# Patient Record
Sex: Female | Born: 1945 | Race: White | Hispanic: No | Marital: Married | Smoking: Former smoker
Health system: Southern US, Community
[De-identification: ages and names within clinical notes are randomized; demographics above are authoritative.]

## PROBLEM LIST (undated history)

## (undated) DIAGNOSIS — R599 Enlarged lymph nodes, unspecified: Secondary | ICD-10-CM

## (undated) DIAGNOSIS — I639 Cerebral infarction, unspecified: Secondary | ICD-10-CM

## (undated) DIAGNOSIS — E039 Hypothyroidism, unspecified: Secondary | ICD-10-CM

## (undated) DIAGNOSIS — M1990 Unspecified osteoarthritis, unspecified site: Secondary | ICD-10-CM

## (undated) DIAGNOSIS — R1310 Dysphagia, unspecified: Secondary | ICD-10-CM

## (undated) DIAGNOSIS — Z973 Presence of spectacles and contact lenses: Secondary | ICD-10-CM

## (undated) DIAGNOSIS — G4730 Sleep apnea, unspecified: Secondary | ICD-10-CM

## (undated) DIAGNOSIS — E0590 Thyrotoxicosis, unspecified without thyrotoxic crisis or storm: Secondary | ICD-10-CM

## (undated) DIAGNOSIS — G629 Polyneuropathy, unspecified: Secondary | ICD-10-CM

## (undated) DIAGNOSIS — K219 Gastro-esophageal reflux disease without esophagitis: Secondary | ICD-10-CM

## (undated) DIAGNOSIS — G709 Myoneural disorder, unspecified: Secondary | ICD-10-CM

## (undated) DIAGNOSIS — I82409 Acute embolism and thrombosis of unspecified deep veins of unspecified lower extremity: Secondary | ICD-10-CM

## (undated) DIAGNOSIS — B49 Unspecified mycosis: Secondary | ICD-10-CM

## (undated) DIAGNOSIS — F32A Depression, unspecified: Secondary | ICD-10-CM

## (undated) DIAGNOSIS — Q2112 Patent foramen ovale: Secondary | ICD-10-CM

## (undated) DIAGNOSIS — F419 Anxiety disorder, unspecified: Secondary | ICD-10-CM

## (undated) DIAGNOSIS — N393 Stress incontinence (female) (male): Secondary | ICD-10-CM

## (undated) DIAGNOSIS — G43909 Migraine, unspecified, not intractable, without status migrainosus: Secondary | ICD-10-CM

## (undated) HISTORY — PX: CARDIAC SURGERY: SHX584

## (undated) HISTORY — DX: Thyrotoxicosis, unspecified without thyrotoxic crisis or storm: E05.90

## (undated) HISTORY — DX: Migraine, unspecified, not intractable, without status migrainosus: G43.909

## (undated) HISTORY — PX: TUBAL LIGATION: SHX77

## (undated) HISTORY — DX: Enlarged lymph nodes, unspecified: R59.9

## (undated) HISTORY — PX: EYE SURGERY: SHX253

## (undated) HISTORY — PX: OTHER SURGICAL HISTORY: SHX169

## (undated) HISTORY — DX: Dysphagia, unspecified: R13.10

---

## 1999-02-25 ENCOUNTER — Ambulatory Visit (HOSPITAL_COMMUNITY): Admission: RE | Admit: 1999-02-25 | Discharge: 1999-02-25 | Payer: Self-pay | Admitting: Internal Medicine

## 1999-09-21 DIAGNOSIS — C801 Malignant (primary) neoplasm, unspecified: Secondary | ICD-10-CM

## 1999-09-21 HISTORY — PX: MOHS SURGERY: SHX181

## 1999-09-21 HISTORY — DX: Malignant (primary) neoplasm, unspecified: C80.1

## 1999-10-14 ENCOUNTER — Encounter: Payer: Self-pay | Admitting: General Surgery

## 1999-10-15 ENCOUNTER — Encounter: Payer: Self-pay | Admitting: General Surgery

## 1999-10-15 ENCOUNTER — Encounter (INDEPENDENT_AMBULATORY_CARE_PROVIDER_SITE_OTHER): Payer: Self-pay | Admitting: Specialist

## 1999-10-15 ENCOUNTER — Ambulatory Visit (HOSPITAL_COMMUNITY): Admission: RE | Admit: 1999-10-15 | Discharge: 1999-10-16 | Payer: Self-pay | Admitting: General Surgery

## 2000-07-22 ENCOUNTER — Other Ambulatory Visit: Admission: RE | Admit: 2000-07-22 | Discharge: 2000-07-22 | Payer: Self-pay | Admitting: Gynecology

## 2001-07-24 ENCOUNTER — Other Ambulatory Visit: Admission: RE | Admit: 2001-07-24 | Discharge: 2001-07-24 | Payer: Self-pay | Admitting: Gynecology

## 2002-07-25 ENCOUNTER — Other Ambulatory Visit: Admission: RE | Admit: 2002-07-25 | Discharge: 2002-07-25 | Payer: Self-pay | Admitting: Gynecology

## 2002-08-14 ENCOUNTER — Encounter: Payer: Self-pay | Admitting: Internal Medicine

## 2002-08-14 ENCOUNTER — Encounter: Admission: RE | Admit: 2002-08-14 | Discharge: 2002-08-14 | Payer: Self-pay | Admitting: Internal Medicine

## 2003-09-12 ENCOUNTER — Other Ambulatory Visit: Admission: RE | Admit: 2003-09-12 | Discharge: 2003-09-12 | Payer: Self-pay | Admitting: Gynecology

## 2004-03-12 ENCOUNTER — Encounter: Admission: RE | Admit: 2004-03-12 | Discharge: 2004-03-12 | Payer: Self-pay | Admitting: Internal Medicine

## 2004-10-06 ENCOUNTER — Other Ambulatory Visit: Admission: RE | Admit: 2004-10-06 | Discharge: 2004-10-06 | Payer: Self-pay | Admitting: Gynecology

## 2005-01-28 ENCOUNTER — Ambulatory Visit (HOSPITAL_COMMUNITY): Admission: RE | Admit: 2005-01-28 | Discharge: 2005-01-28 | Payer: Self-pay | Admitting: *Deleted

## 2005-04-13 ENCOUNTER — Ambulatory Visit (HOSPITAL_COMMUNITY): Admission: RE | Admit: 2005-04-13 | Discharge: 2005-04-13 | Payer: Self-pay | Admitting: *Deleted

## 2005-04-13 ENCOUNTER — Encounter (INDEPENDENT_AMBULATORY_CARE_PROVIDER_SITE_OTHER): Payer: Self-pay | Admitting: *Deleted

## 2005-10-26 ENCOUNTER — Other Ambulatory Visit: Admission: RE | Admit: 2005-10-26 | Discharge: 2005-10-26 | Payer: Self-pay | Admitting: Gynecology

## 2006-02-27 ENCOUNTER — Emergency Department (HOSPITAL_COMMUNITY): Admission: EM | Admit: 2006-02-27 | Discharge: 2006-02-28 | Payer: Self-pay | Admitting: Emergency Medicine

## 2007-01-24 ENCOUNTER — Other Ambulatory Visit: Admission: RE | Admit: 2007-01-24 | Discharge: 2007-01-24 | Payer: Self-pay | Admitting: Gynecology

## 2008-02-29 ENCOUNTER — Other Ambulatory Visit: Admission: RE | Admit: 2008-02-29 | Discharge: 2008-02-29 | Payer: Self-pay | Admitting: Gynecology

## 2010-06-21 ENCOUNTER — Inpatient Hospital Stay (HOSPITAL_COMMUNITY): Admission: EM | Admit: 2010-06-21 | Discharge: 2010-06-26 | Payer: Self-pay | Admitting: Emergency Medicine

## 2010-06-21 ENCOUNTER — Ambulatory Visit: Payer: Self-pay | Admitting: Cardiology

## 2010-06-21 ENCOUNTER — Ambulatory Visit: Payer: Self-pay | Admitting: Cardiovascular Disease

## 2010-06-21 ENCOUNTER — Ambulatory Visit: Payer: Self-pay | Admitting: Critical Care Medicine

## 2010-06-23 ENCOUNTER — Encounter: Payer: Self-pay | Admitting: Emergency Medicine

## 2010-06-28 ENCOUNTER — Inpatient Hospital Stay (HOSPITAL_COMMUNITY): Admission: EM | Admit: 2010-06-28 | Discharge: 2010-07-05 | Payer: Self-pay | Admitting: Emergency Medicine

## 2010-06-29 ENCOUNTER — Ambulatory Visit: Payer: Self-pay | Admitting: Infectious Diseases

## 2010-06-30 ENCOUNTER — Ambulatory Visit: Payer: Self-pay | Admitting: Thoracic Surgery

## 2010-07-02 ENCOUNTER — Encounter: Payer: Self-pay | Admitting: Emergency Medicine

## 2010-07-03 ENCOUNTER — Ambulatory Visit: Payer: Self-pay | Admitting: Infectious Diseases

## 2010-07-17 ENCOUNTER — Ambulatory Visit: Payer: Self-pay | Admitting: Emergency Medicine

## 2010-07-17 DIAGNOSIS — E0590 Thyrotoxicosis, unspecified without thyrotoxic crisis or storm: Secondary | ICD-10-CM | POA: Insufficient documentation

## 2010-07-17 DIAGNOSIS — G43909 Migraine, unspecified, not intractable, without status migrainosus: Secondary | ICD-10-CM | POA: Insufficient documentation

## 2010-07-17 DIAGNOSIS — R599 Enlarged lymph nodes, unspecified: Secondary | ICD-10-CM | POA: Insufficient documentation

## 2010-07-30 ENCOUNTER — Ambulatory Visit: Payer: Self-pay | Admitting: Emergency Medicine

## 2010-08-04 ENCOUNTER — Telehealth (INDEPENDENT_AMBULATORY_CARE_PROVIDER_SITE_OTHER): Payer: Self-pay | Admitting: *Deleted

## 2010-08-05 ENCOUNTER — Telehealth (INDEPENDENT_AMBULATORY_CARE_PROVIDER_SITE_OTHER): Payer: Self-pay | Admitting: *Deleted

## 2010-09-11 ENCOUNTER — Ambulatory Visit: Payer: Self-pay | Admitting: Cardiology

## 2010-09-18 ENCOUNTER — Ambulatory Visit: Payer: Self-pay | Admitting: Emergency Medicine

## 2010-10-20 NOTE — Progress Notes (Signed)
Summary: schedule ct chest > late December  Phone Note Call from Patient Call back at [facility] Phone [phone]   Caller: Patient Call For: byrum Summary of Call: Wants to get a ct chest scheduled. Initial call taken by: Darletta Moll,  August 04, 2010 1:51 PM  Follow-up for Phone Call        per last OV w/ RB, to have follow up CT in mid-Jan.  called spoke with patient who states that it was discussed to have the CT in late-Dec rather than wait till the new year d/t insurance.  pt requests either the 23rd of december or the last week in dec early in the day.  Dr. Delton Coombes, can the CT be moved up?  will be happy to place the order. Boone Master CNA/MA  August 04, 2010 3:00 PM   Thanks, yes, the CT can be done late Dec instead. Leslye Peer MD  August 04, 2010 3:37 PM  Follow-up by: Leslye Peer MD,  August 04, 2010 3:37 PM  Additional Follow-up for Phone Call Additional follow up Details #1::        per Bjorn Loser, ok to send phone note to [facility] inbox rather than another order.  last CT chest was with contrast in 10.2011.  pt prefers either 12.23.11 any time of the day, or the last week in December early day. Boone Master CNA/MA  August 04, 2010 3:54 PM     Additional Follow-up for Phone Call Additional follow up Details #2::    chest ct with contrast @[facility]  09/11/10@10 :00am pt is aware  Follow-up by: Oneita Jolly,  August 04, 2010 4:17 PM

## 2010-10-20 NOTE — Assessment & Plan Note (Signed)
Summary: abnormal CTscan of the chest   Visit Type:  Hospital Follow-up Primary Aiza Vollrath/Referring Desyre Calma:  Dr. Burton Apley  CC:  HFU...the patient c/o an occasinal  dry hacky cough...oral thrush is gone .  History of Present Illness: 45 overwt woman, hypothyroidism, recently admitted to [facility] with lymphadenitis/salivary gland assoc with septic shock and transaminitis. No other source identified. W/u identified nodular infiltrates and hilar LAD. She was readmitted with rash and her LAD was felt to be worse, therefore underwent Wang needle bx's with EBUS. Path negative.  She needs a functional exam today to fill out paperwork for shortterm disability.   ROV 07/30/10 -- f/u for her hilar LAD, patchy parenchymal infiltrates on CT scan 10/11. AFB and fungal negative to date. EBUS bx's negative. Feels great, back to her baseline.   Preventive Screening-Counseling & Management  Alcohol-Tobacco     Smoking Status: never  Current Medications (verified): 1)  Aspirin 325 Mg Tabs (Aspirin) .... Take 1 Tablet By Mouth Once A Day and As Needed 2)  Levothroid 100 Mcg Tabs (Levothyroxine Sodium) .... Take 1 Tablet By Mouth Once A Day 3)  Ranitidine Hcl 150 Mg Tabs (Ranitidine Hcl) .... As Needed 4)  Fiorinal 50-325-40 Mg Caps (Butalbital-Aspirin-Caffeine) .... As Needed  Allergies: 1)  ! * Statins 2)  ! * Losartan Hct 3)  ! * Quinolones 4)  Pcn  Social History: Smoking Status:  never  Vital Signs:  Patient profile:   65 year old female Height:      65 inches (165.10 cm) Weight:      154.13 pounds (70.06 kg) BMI:     25.74 O2 Sat:      97 % on Room air Temp:     97.7 degrees F (36.50 degrees C) oral Pulse rate:   110 / minute BP sitting:   122 / 90  (left arm) Cuff size:   regular  O2 Sat at Rest %:  97 O2 Flow:  Room air CC: HFU...the patient c/o an occasinal  dry hacky cough...oral thrush is gone  Comments Medications reviewed with patient Michel Bickers CMA  July 30, 2010 3:15  PM   Physical Exam  General:  normal appearance, healthy appearing, and obese.   Head:  normocephalic and atraumatic Eyes:  conjunctiva and sclera clear Nose:  no deformity, discharge, inflammation, or lesions Mouth:  no deformity or lesions Neck:  no masses, thyromegaly, or abnormal cervical nodes Lungs:  clear bilaterally Heart:  regular rate and rhythm, S1, S2 without murmurs, rubs, gallops, or clicks Abdomen:  not examined Msk:  no deformity or scoliosis noted with normal posture Skin:  intact without lesions or rashes Psych:  alert and cooperative; normal mood and affect; normal attention span and concentration   Impression & Recommendations:  Problem # 1:  LYMPHADENOPATHY (ICD-785.6) Repeat Ct scan in january review the scan and decide re further biopsies, serial films, etc.   Other Orders: Radiology Referral (Radiology) Est. Patient Level IV (14782)  Patient Instructions: 1)  We will repeat your CT scan of the chest in mid-January 2012.  2)  Follow with Dr Delton Coombes after the scan to review or sooner if you have any problems.    Immunization History:  Influenza Immunization History:    Influenza:  historical (12/19/2009)

## 2010-10-20 NOTE — Letter (Signed)
Summary: [facility]  [address]   [place], [place] [address]   Phone: [phone]  Fax: [phone]    07/30/2010    TO WHOM IT MAY CONCERN,   [name] has been under my care and may return to work on Monday, 08/03/2010, with no restrictions.    Sincerely,   Levy Pupa, M.D.

## 2010-10-20 NOTE — Progress Notes (Signed)
Summary: cat scan-[id number]  Phone Note Call from Patient Call back at [phone]   Caller: Patient Call For: byrum Summary of Call: pt would like to talk to nurse about cat scan. Initial call taken by: Rickard Patience,  August 05, 2010 8:30 AM  Follow-up for Phone Call        [email or web address]. Carron Curie CMA  August 05, 2010 9:18 AM  pt has already spoken to someone and her question has been answered Follow-up by: Philipp Deputy CMA,  August 05, 2010 5:28 PM

## 2010-10-20 NOTE — Assessment & Plan Note (Signed)
Summary: LAD   Visit Type:  Hospital Follow-up  CC:  Follow up for disability paperwork.  SOB when doing activity but overall doing well.  "constant cough, " head congestion.  Cough is dry, and hacky.  Marland Kitchen  History of Present Illness: 46 overwt woman, hypothyroidism, recently admitted to [facility] with lymphadenitis/salivary gland assoc with septic shock and transaminitis. No other source identified. W/u identified nodular infiltrates and mediastinal LAD. She was readmitted with rash and her LAD was felt to be worse, therefore underwent Wang needle bx's with EBUS. Path negative.  She needs a functional exam today to fill out paperwork for shortterm disability.   Current Medications (verified): 1)  Aspirin 325 Mg Tabs (Aspirin) .... Take 1 Tablet By Mouth Once A Day and As Needed 2)  Levothroid 100 Mcg Tabs (Levothyroxine Sodium) .... Take 1 Tablet By Mouth Once A Day 3)  Ranitidine Hcl 150 Mg Tabs (Ranitidine Hcl) .... As Needed 4)  Fiorinal 50-325-40 Mg Caps (Butalbital-Aspirin-Caffeine) .... As Needed  Allergies (verified): 1)  ! * Statins 2)  Pcn  Past History:  Past Medical History: MIGRAINE HEADACHE (ICD-346.90) HYPERTHYROIDISM (ICD-242.90)  Past Surgical History: melonoma removed left thigh - 2001  Family History: brother - arthritis sister - arthritis  Social History: Never smoked married 2 children [employer]   Vital Signs:  Patient profile:   65 year old female Height:      65 inches Weight:      157.25 pounds BMI:     26.26 O2 Sat:      96 % on Room air Temp:     98.4 degrees F oral Pulse rate:   97 / minute BP sitting:   138 / 92  (right arm) Cuff size:   regular  Vitals Entered By: Gweneth Dimitri RN (July 17, 2010 1:39 PM)  O2 Flow:  Room air CC: Follow up for disability paperwork.  SOB when doing activity but overall doing well.  "constant cough," head congestion.  Cough is dry, hacky.   Comments Medications reviewed with patient Daytime contact  number verified with patient. Gweneth Dimitri RN  July 17, 2010 1:40 PM    Physical Exam  General:  normal appearance, healthy appearing, and obese.   Head:  normocephalic and atraumatic Eyes:  conjunctiva and sclera clear Nose:  no deformity, discharge, inflammation, or lesions Mouth:  no deformity or lesions Neck:  no masses, thyromegaly, or abnormal cervical nodes Lungs:  clear bilaterally Heart:  regular rate and rhythm, S1, S2 without murmurs, rubs, gallops, or clicks Abdomen:  not examined Msk:  no deformity or scoliosis noted with normal posture Skin:  intact without lesions or rashes Psych:  alert and cooperative; normal mood and affect; normal attention span and concentration   Impression & Recommendations:  Problem # 1:  LYMPHADENOPATHY (ICD-785.6)  Orders: New Patient Level IV ([address]109)  Medications Added to Medication List This Visit: 1)  Aspirin 325 Mg Tabs (Aspirin) .... Take 1 tablet by mouth once a day and as needed 2)  Levothroid 100 Mcg Tabs (Levothyroxine sodium) .... Take 1 tablet by mouth once a day 3)  Ranitidine Hcl 150 Mg Tabs (Ranitidine hcl) .... As needed 4)  Fiorinal 50-325-40 Mg Caps (Butalbital-aspirin-caffeine) .... As needed 5)  Fluconazole 100 Mg Tabs (Fluconazole) .Marland Kitchen.. 1 by mouth once daily  Patient Instructions: 1)  Your biopsy results and cultures are negative so far. We will follow these out for 6 weeks 2)  Take fluconazole 100mg  once daily for 3  days for possible thrush 3)  Follow up with Dr Delton Coombes on Nov 10 as planned.  Prescriptions: FLUCONAZOLE 100 MG TABS (FLUCONAZOLE) 1 by mouth once daily  #3 x 0   Entered and Authorized by:   Leslye Peer MD   Signed by:   Leslye Peer MD on 07/17/2010   Method used:   Electronically to        [facility] (retail)       [address]       [place], [place]  [address]       Ph: [phone]       Fax: [phone]   RxID:   [id number]

## 2010-10-22 NOTE — Assessment & Plan Note (Signed)
Summary: LAD, nodule   Visit Type:  Follow-up Primary Joury Allcorn/Referring Lenford Beddow:  Dr. Burton Apley  CC:  Followup on CT Chest- pt states doing well and denies any complaints today.Marland Kitchen  History of Present Illness: 27 overwt woman, hypothyroidism, recently admitted to [facility] with lymphadenitis/salivary gland assoc with septic shock and transaminitis. No other source identified. W/u identified nodular infiltrates and hilar LAD. She was readmitted with rash and her LAD was felt to be worse, therefore underwent Wang needle bx's with EBUS. Path negative.  She needs a functional exam today to fill out paperwork for shortterm disability.   ROV 07/30/10 -- f/u for her hilar LAD, patchy parenchymal infiltrates on CT scan 10/11. AFB and fungal negative to date. EBUS bx's negative. Feels great, back to her baseline.   ROV 09/18/10 -- feeling well, no new problems. CT scan done as below, shows resolution of parenchymal infiltrates, improvement in LAD, LLL pleural based nodule.   Current Medications (verified): 1)  Aspirin 325 Mg Tabs (Aspirin) .... Take 1 Tablet By Mouth Once A Day and As Needed 2)  Levothroid 100 Mcg Tabs (Levothyroxine Sodium) .... Take 1 Tablet By Mouth Once A Day 3)  Ranitidine Hcl 150 Mg Tabs (Ranitidine Hcl) .... As Needed 4)  Fiorinal 50-325-40 Mg Caps (Butalbital-Aspirin-Caffeine) .... As Needed 5)  Prednisone 20 Mg Tabs (Prednisone) .... Tapered Dose As Directed Per [employer] 6)  Diphenhydramine Hcl 50 Mg Tabs (Diphenhydramine Hcl) .Marland Kitchen.. 1 Three Times A Day  Allergies: 1)  ! * Statins 2)  ! * Losartan Hct 3)  ! * Quinolones 4)  ! * Ivp Dye 5)  Pcn  Vital Signs:  Patient profile:   65 year old female Weight:      155 pounds O2 Sat:      97 % on Room air Temp:     98.0 degrees F oral Pulse rate:   70 / minute BP sitting:   122 / 82  (left arm)  Vitals Entered By: Vernie Murders (September 18, 2010 2:46 PM)  O2 Flow:  Room air  Physical Exam  General:  normal  appearance, healthy appearing, and obese.   Head:  normocephalic and atraumatic Eyes:  conjunctiva and sclera clear Nose:  no deformity, discharge, inflammation, or lesions Mouth:  no deformity or lesions Neck:  no masses, thyromegaly, or abnormal cervical nodes Lungs:  clear bilaterally Heart:  regular rate and rhythm, S1, S2 without murmurs, rubs, gallops, or clicks Abdomen:  not examined Msk:  no deformity or scoliosis noted with normal posture Skin:  intact without lesions or rashes Psych:  alert and cooperative; normal mood and affect; normal attention span and concentration   CT of Chest  Procedure date:  09/11/2010  Findings:      Comparison: Chest CT 06/28/2010 and 06/21/2010.   Findings:  There has been interval improvement in the previously demonstrated mediastinal and hilar lymphadenopathy.  The largest remaining node in the azygos region measures 12 mm short axis on image 18.  There is no axillary adenopathy.   The pulmonary aeration has significantly improved compared with the prior examination.  The previously noted multi focal peripheral parenchymal opacities have resolved.  There is a small subpleural nodule in the left lower lobe measuring 6 mm on image 40.  This is well circumscribed and likely obscured by parenchymal opacity on the most recent examination.  No gross change from 06/21/2010 is identified.  There is no endobronchial lesion.  There is no pleural or pericardial effusion.  Small hiatal hernia is noted.  The visualized upper abdomen has a stable appearance with a small cyst in the upper pole of the left kidney.   IMPRESSION:   1.  Interval marked improvement in the mediastinal and hilar adenopathy. 2.  Interval resolution of multifocal pulmonary parenchymal opacities. 3.  Small subpleural left lower lobe nodule likely stable, but partly obscured by adjacent parenchymal opacity on the prior examinations. 4.  No acute findings  demonstrated.  Impression & Recommendations:  Problem # 1:  LYMPHADENOPATHY (ICD-785.6)  Resolved by CT. 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 The parenchymal infiltrates have resolved as well.  - need repeat CT in 08/2011 to follow LLL nodule 6mm  Orders: Est. Patient Level III (95284)  Medications Added to Medication List This Visit: 1)  Prednisone 20 Mg Tabs (Prednisone) .... Tapered dose as directed per [employer] 2)  Diphenhydramine Hcl 50 Mg Tabs (Diphenhydramine hcl) .Marland Kitchen.. 1 three times a day  Patient Instructions: 1)  We need to repeat your CT scan of the chest in December 2012 to follow your L lower nodule. Please call us in November so we can arrange.  2)  Call our office if you have any problems before your CT scan.

## 2010-12-02 LAB — CBC
HCT: 31.8 % — ABNORMAL LOW (ref 36.0–46.0)
Hemoglobin: 10.8 g/dL — ABNORMAL LOW (ref 12.0–15.0)
MCH: 32 pg (ref 26.0–34.0)
MCH: 32.2 pg (ref 26.0–34.0)
MCHC: 33.8 g/dL (ref 30.0–36.0)
MCV: 94.4 fL (ref 78.0–100.0)
MCV: 95.1 fL (ref 78.0–100.0)
Platelets: 416 K/uL — ABNORMAL HIGH (ref 150–400)
Platelets: 532 10*3/uL — ABNORMAL HIGH (ref 150–400)
RBC: 3.36 MIL/uL — ABNORMAL LOW (ref 3.87–5.11)
RBC: 3.61 MIL/uL — ABNORMAL LOW (ref 3.87–5.11)
RDW: 14.2 % (ref 11.5–15.5)
WBC: 6.9 K/uL (ref 4.0–10.5)

## 2010-12-02 LAB — COMPREHENSIVE METABOLIC PANEL
ALT: 28 U/L (ref 0–35)
AST: 19 U/L (ref 0–37)
Albumin: 2.5 g/dL — ABNORMAL LOW (ref 3.5–5.2)
Albumin: 2.8 g/dL — ABNORMAL LOW (ref 3.5–5.2)
Alkaline Phosphatase: 128 U/L — ABNORMAL HIGH (ref 39–117)
BUN: 3 mg/dL — ABNORMAL LOW (ref 6–23)
BUN: 4 mg/dL — ABNORMAL LOW (ref 6–23)
CO2: 29 mEq/L (ref 19–32)
Chloride: 106 mEq/L (ref 96–112)
Creatinine, Ser: 0.55 mg/dL (ref 0.4–1.2)
GFR calc Af Amer: >60 mL/min (ref >60)
GFR calc non Af Amer: >60 mL/min (ref >60)
Potassium: 4 mEq/L (ref 3.5–5.1)
Sodium: 140 mEq/L (ref 135–145)
Total Bilirubin: 0.2 mg/dL — ABNORMAL LOW (ref 0.3–1.2)
Total Protein: 5.6 g/dL — ABNORMAL LOW (ref 6.0–8.3)

## 2010-12-03 LAB — URINALYSIS, ROUTINE W REFLEX MICROSCOPIC
Bilirubin Urine: NEGATIVE
Glucose, UA: NEGATIVE mg/dL
Hgb urine dipstick: NEGATIVE
Nitrite: NEGATIVE
Specific Gravity, Urine: 1.031 — ABNORMAL HIGH (ref 1.005–1.030)
pH: 5.5 (ref 5.0–8.0)
pH: 6 (ref 5.0–8.0)

## 2010-12-03 LAB — CBC
HCT: 32.5 % — ABNORMAL LOW (ref 36.0–46.0)
HCT: 32.9 % — ABNORMAL LOW (ref 36.0–46.0)
HCT: 33 % — ABNORMAL LOW (ref 36.0–46.0)
HCT: 34.4 % — ABNORMAL LOW (ref 36.0–46.0)
Hemoglobin: 11.5 g/dL — ABNORMAL LOW (ref 12.0–15.0)
Hemoglobin: 11.6 g/dL — ABNORMAL LOW (ref 12.0–15.0)
Hemoglobin: 11.7 g/dL — ABNORMAL LOW (ref 12.0–15.0)
Hemoglobin: 13.8 g/dL (ref 12.0–15.0)
MCH: 31.6 pg (ref 26.0–34.0)
MCH: 32.1 pg (ref 26.0–34.0)
MCH: 32.2 pg (ref 26.0–34.0)
MCH: 32.6 pg (ref 26.0–34.0)
MCHC: 34 g/dL (ref 30.0–36.0)
MCHC: 34.1 g/dL (ref 30.0–36.0)
MCHC: 34.2 g/dL (ref 30.0–36.0)
MCHC: 34.4 g/dL (ref 30.0–36.0)
MCHC: 34.7 g/dL (ref 30.0–36.0)
MCV: 93.1 fL (ref 78.0–100.0)
MCV: 93.1 fL (ref 78.0–100.0)
MCV: 93.3 fL (ref 78.0–100.0)
MCV: 93.7 fL (ref 78.0–100.0)
MCV: 93.8 fL (ref 78.0–100.0)
MCV: 94.7 fL (ref 78.0–100.0)
Platelets: 189 10*3/uL (ref 150–400)
Platelets: 231 10*3/uL (ref 150–400)
Platelets: 254 10*3/uL (ref 150–400)
Platelets: 273 10*3/uL (ref 150–400)
Platelets: 287 10*3/uL (ref 150–400)
Platelets: 313 10*3/uL (ref 150–400)
Platelets: 375 10*3/uL (ref 150–400)
RBC: 3.45 MIL/uL — ABNORMAL LOW (ref 3.87–5.11)
RBC: 3.63 MIL/uL — ABNORMAL LOW (ref 3.87–5.11)
RBC: 3.64 MIL/uL — ABNORMAL LOW (ref 3.87–5.11)
RBC: 3.65 MIL/uL — ABNORMAL LOW (ref 3.87–5.11)
RBC: 3.71 MIL/uL — ABNORMAL LOW (ref 3.87–5.11)
RBC: 4.24 MIL/uL (ref 3.87–5.11)
RDW: 12.8 % (ref 11.5–15.5)
RDW: 12.9 % (ref 11.5–15.5)
RDW: 13.3 % (ref 11.5–15.5)
RDW: 13.6 % (ref 11.5–15.5)
WBC: 10.3 10*3/uL (ref 4.0–10.5)
WBC: 12.9 10*3/uL — ABNORMAL HIGH (ref 4.0–10.5)
WBC: 5.7 10*3/uL (ref 4.0–10.5)
WBC: 9.8 10*3/uL (ref 4.0–10.5)

## 2010-12-03 LAB — CULTURE, BLOOD (ROUTINE X 2)
Culture  Setup Time: 201110021517
Culture  Setup Time: 201110091209
Culture: NO GROWTH

## 2010-12-03 LAB — BLOOD GAS, ARTERIAL
Acid-base deficit: 9.2 mmol/L — ABNORMAL HIGH (ref 0.0–2.0)
Drawn by: 21338
FIO2: 0.21 %
O2 Saturation: 94.4 %
pO2, Arterial: 71.6 mmHg — ABNORMAL LOW (ref 80.0–100.0)

## 2010-12-03 LAB — URINE CULTURE
Colony Count: NO GROWTH
Culture  Setup Time: 201110021517
Culture  Setup Time: 201110091331
Culture: NO GROWTH

## 2010-12-03 LAB — DIFFERENTIAL
Basophils Absolute: 0 10*3/uL (ref 0.0–0.1)
Lymphocytes Relative: 1 % — ABNORMAL LOW (ref 12–46)
Lymphocytes Relative: 3 % — ABNORMAL LOW (ref 12–46)
Lymphs Abs: 0.1 10*3/uL — ABNORMAL LOW (ref 0.7–4.0)
Monocytes Absolute: 0.5 10*3/uL (ref 0.1–1.0)
Monocytes Relative: 0 % — ABNORMAL LOW (ref 3–12)
Neutro Abs: 17.7 10*3/uL — ABNORMAL HIGH (ref 1.7–7.7)
Neutro Abs: 3.1 10*3/uL (ref 1.7–7.7)
Neutrophils Relative %: 96 % — ABNORMAL HIGH (ref 43–77)
Neutrophils Relative %: 96 % — ABNORMAL HIGH (ref 43–77)

## 2010-12-03 LAB — FUNGUS CULTURE W SMEAR
Fungal Smear: NONE SEEN
Fungal Smear: NONE SEEN

## 2010-12-03 LAB — BASIC METABOLIC PANEL
BUN: 4 mg/dL — ABNORMAL LOW (ref 6–23)
BUN: 5 mg/dL — ABNORMAL LOW (ref 6–23)
BUN: 5 mg/dL — ABNORMAL LOW (ref 6–23)
CO2: 19 mEq/L (ref 19–32)
Calcium: 7.2 mg/dL — ABNORMAL LOW (ref 8.4–10.5)
Calcium: 8.3 mg/dL — ABNORMAL LOW (ref 8.4–10.5)
Chloride: 112 mEq/L (ref 96–112)
Chloride: 112 mEq/L (ref 96–112)
Creatinine, Ser: 0.54 mg/dL (ref 0.4–1.2)
Creatinine, Ser: 0.54 mg/dL (ref 0.4–1.2)
Creatinine, Ser: 0.64 mg/dL (ref 0.4–1.2)
GFR calc Af Amer: >60 mL/min (ref >60)
GFR calc Af Amer: >60 mL/min (ref >60)
GFR calc non Af Amer: >60 mL/min (ref >60)
GFR calc non Af Amer: >60 mL/min (ref >60)
GFR calc non Af Amer: >60 mL/min (ref >60)
Glucose, Bld: 135 mg/dL — ABNORMAL HIGH (ref 70–99)
Glucose, Bld: 140 mg/dL — ABNORMAL HIGH (ref 70–99)
Glucose, Bld: 98 mg/dL (ref 70–99)
Potassium: 3.6 mEq/L (ref 3.5–5.1)
Potassium: 3.8 mEq/L (ref 3.5–5.1)
Sodium: 139 mEq/L (ref 135–145)

## 2010-12-03 LAB — COMPREHENSIVE METABOLIC PANEL
ALT: 248 U/L — ABNORMAL HIGH (ref 0–35)
ALT: 459 U/L — ABNORMAL HIGH (ref 0–35)
AST: 125 U/L — ABNORMAL HIGH (ref 0–37)
AST: 29 U/L (ref 0–37)
AST: 50 U/L — ABNORMAL HIGH (ref 0–37)
Albumin: 2.4 g/dL — ABNORMAL LOW (ref 3.5–5.2)
Albumin: 2.6 g/dL — ABNORMAL LOW (ref 3.5–5.2)
Albumin: 2.8 g/dL — ABNORMAL LOW (ref 3.5–5.2)
Albumin: 2.9 g/dL — ABNORMAL LOW (ref 3.5–5.2)
Alkaline Phosphatase: 194 U/L — ABNORMAL HIGH (ref 39–117)
Alkaline Phosphatase: 206 U/L — ABNORMAL HIGH (ref 39–117)
Alkaline Phosphatase: 224 U/L — ABNORMAL HIGH (ref 39–117)
BUN: 3 mg/dL — ABNORMAL LOW (ref 6–23)
BUN: 4 mg/dL — ABNORMAL LOW (ref 6–23)
BUN: 4 mg/dL — ABNORMAL LOW (ref 6–23)
BUN: 4 mg/dL — ABNORMAL LOW (ref 6–23)
BUN: 6 mg/dL (ref 6–23)
BUN: 6 mg/dL (ref 6–23)
BUN: 8 mg/dL (ref 6–23)
BUN: 8 mg/dL (ref 6–23)
CO2: 19 mEq/L (ref 19–32)
CO2: 23 mEq/L (ref 19–32)
CO2: 23 mEq/L (ref 19–32)
CO2: 26 mEq/L (ref 19–32)
CO2: 27 mEq/L (ref 19–32)
Calcium: 7.3 mg/dL — ABNORMAL LOW (ref 8.4–10.5)
Calcium: 8.2 mg/dL — ABNORMAL LOW (ref 8.4–10.5)
Calcium: 8.3 mg/dL — ABNORMAL LOW (ref 8.4–10.5)
Calcium: 8.3 mg/dL — ABNORMAL LOW (ref 8.4–10.5)
Calcium: 8.4 mg/dL (ref 8.4–10.5)
Chloride: 101 mEq/L (ref 96–112)
Chloride: 108 mEq/L (ref 96–112)
Chloride: 110 mEq/L (ref 96–112)
Chloride: 111 mEq/L (ref 96–112)
Creatinine, Ser: 0.48 mg/dL (ref 0.4–1.2)
Creatinine, Ser: 0.48 mg/dL (ref 0.4–1.2)
Creatinine, Ser: 0.48 mg/dL (ref 0.4–1.2)
Creatinine, Ser: 0.5 mg/dL (ref 0.4–1.2)
Creatinine, Ser: 0.51 mg/dL (ref 0.4–1.2)
Creatinine, Ser: 0.56 mg/dL (ref 0.4–1.2)
Creatinine, Ser: 0.69 mg/dL (ref 0.4–1.2)
Creatinine, Ser: 0.77 mg/dL (ref 0.4–1.2)
GFR calc Af Amer: >60 mL/min (ref >60)
GFR calc Af Amer: >60 mL/min (ref >60)
GFR calc Af Amer: >60 mL/min (ref >60)
GFR calc non Af Amer: >60 mL/min (ref >60)
GFR calc non Af Amer: >60 mL/min (ref >60)
GFR calc non Af Amer: >60 mL/min (ref >60)
GFR calc non Af Amer: >60 mL/min (ref >60)
Glucose, Bld: 101 mg/dL — ABNORMAL HIGH (ref 70–99)
Glucose, Bld: 108 mg/dL — ABNORMAL HIGH (ref 70–99)
Glucose, Bld: 110 mg/dL — ABNORMAL HIGH (ref 70–99)
Glucose, Bld: 113 mg/dL — ABNORMAL HIGH (ref 70–99)
Glucose, Bld: 128 mg/dL — ABNORMAL HIGH (ref 70–99)
Glucose, Bld: 89 mg/dL (ref 70–99)
Glucose, Bld: 89 mg/dL (ref 70–99)
Potassium: 3.6 mEq/L (ref 3.5–5.1)
Potassium: 4 mEq/L (ref 3.5–5.1)
Potassium: 4 mEq/L (ref 3.5–5.1)
Sodium: 143 mEq/L (ref 135–145)
Sodium: 144 mEq/L (ref 135–145)
Total Bilirubin: 0.5 mg/dL (ref 0.3–1.2)
Total Bilirubin: 0.8 mg/dL (ref 0.3–1.2)
Total Bilirubin: 0.9 mg/dL (ref 0.3–1.2)
Total Bilirubin: 1.5 mg/dL — ABNORMAL HIGH (ref 0.3–1.2)
Total Protein: 4.6 g/dL — ABNORMAL LOW (ref 6.0–8.3)
Total Protein: 5.4 g/dL — ABNORMAL LOW (ref 6.0–8.3)
Total Protein: 5.5 g/dL — ABNORMAL LOW (ref 6.0–8.3)
Total Protein: 5.5 g/dL — ABNORMAL LOW (ref 6.0–8.3)
Total Protein: 5.6 g/dL — ABNORMAL LOW (ref 6.0–8.3)

## 2010-12-03 LAB — EXPECTORATED SPUTUM ASSESSMENT W GRAM STAIN, RFLX TO RESP C

## 2010-12-03 LAB — WOUND CULTURE: Culture: NO GROWTH

## 2010-12-03 LAB — HEPATITIS PANEL, ACUTE
HCV Ab: NEGATIVE
Hep A IgM: NEGATIVE
Hep B C IgM: NEGATIVE

## 2010-12-03 LAB — CULTURE, BLOOD (SINGLE): Culture  Setup Time: 201110110207

## 2010-12-03 LAB — AFB CULTURE WITH SMEAR (NOT AT ARMC)
Acid Fast Smear: NONE SEEN
Acid Fast Smear: NONE SEEN

## 2010-12-03 LAB — CRYPTOCOCCUS ANTIBODY: Cryptococcus Antibodies, Quant: <1:16 {titer}

## 2010-12-03 LAB — CRYPTOCOCCAL ANTIGEN: Crypto Ag: NEGATIVE

## 2010-12-03 LAB — T3, FREE: T3, Free: 1.8 pg/mL — ABNORMAL LOW (ref 2.3–4.2)

## 2010-12-03 LAB — PROTIME-INR
INR: 1.3 (ref 0.00–1.49)
Prothrombin Time: 16.4 seconds — ABNORMAL HIGH (ref 11.6–15.2)

## 2010-12-03 LAB — CARDIAC PANEL(CRET KIN+CKTOT+MB+TROPI)
CK, MB: 7.1 ng/mL (ref 0.3–4.0)
Relative Index: INVALID (ref 0.0–2.5)
Troponin I: 1.15 ng/mL (ref 0.00–0.06)

## 2010-12-03 LAB — FUNGUS CULTURE, BLOOD: Culture: NO GROWTH

## 2010-12-03 LAB — INFLUENZA PANEL BY PCR (TYPE A & B)
H1N1 flu by pcr: NOT DETECTED
Influenza A By PCR: NEGATIVE
Influenza B By PCR: NEGATIVE

## 2010-12-03 LAB — HISTOPLASMA ANTIGEN, URINE

## 2010-12-03 LAB — HIV ANTIBODY (ROUTINE TESTING W REFLEX): HIV: NONREACTIVE

## 2010-12-03 LAB — FUNGAL STAIN

## 2010-12-03 LAB — POCT CARDIAC MARKERS
CKMB, poc: <1 ng/mL — ABNORMAL LOW (ref 1.0–8.0)
Myoglobin, poc: 58.7 ng/mL (ref 12–200)

## 2010-12-03 LAB — CULTURE, RESPIRATORY W GRAM STAIN
Culture: NO GROWTH
Culture: NORMAL

## 2010-12-03 LAB — POCT I-STAT, CHEM 8
Chloride: 106 mEq/L (ref 96–112)
Creatinine, Ser: 0.7 mg/dL (ref 0.4–1.2)
Glucose, Bld: 114 mg/dL — ABNORMAL HIGH (ref 70–99)
Potassium: 4.1 mEq/L (ref 3.5–5.1)

## 2010-12-03 LAB — AFB STAIN: Acid Fast Smear: NONE SEEN

## 2010-12-03 LAB — APTT: aPTT: 38 seconds — ABNORMAL HIGH (ref 24–37)

## 2010-12-03 LAB — CARBOXYHEMOGLOBIN
Carboxyhemoglobin: 1.2 % (ref 0.5–1.5)
Methemoglobin: 0.8 % (ref 0.0–1.5)
Total hemoglobin: 11.6 g/dL — ABNORMAL LOW (ref 12.5–16.0)

## 2010-12-03 LAB — STREP PNEUMONIAE URINARY ANTIGEN: Strep Pneumo Urinary Antigen: NEGATIVE

## 2010-12-03 LAB — MAGNESIUM: Magnesium: 1.6 mg/dL (ref 1.5–2.5)

## 2010-12-03 LAB — TYPE AND SCREEN
ABO/RH(D): A NEG
Antibody Screen: NEGATIVE

## 2010-12-03 LAB — PROCALCITONIN: Procalcitonin: 4.9 ng/mL

## 2010-12-03 LAB — SEDIMENTATION RATE: Sed Rate: 40 mm/hr — ABNORMAL HIGH (ref 0–22)

## 2010-12-03 LAB — LACTIC ACID, PLASMA
Lactic Acid, Venous: 1.9 mmol/L (ref 0.5–2.2)
Lactic Acid, Venous: 2.1 mmol/L (ref 0.5–2.2)

## 2010-12-15 ENCOUNTER — Other Ambulatory Visit: Payer: Self-pay

## 2011-02-05 ENCOUNTER — Other Ambulatory Visit: Payer: Self-pay | Admitting: Dermatology

## 2011-02-05 NOTE — H&P (Signed)
NAMESYD, MANGES NO.:  [id number]   MEDICAL RECORD NO.:  [id number]          PATIENT TYPE:  INP   LOCATION:  0102                         FACILITY:  [facility]   PHYSICIAN:  Della Goo, M.D. DATE OF BIRTH:  05-21-1946   DATE OF ADMISSION:  02/27/2006  DATE OF DISCHARGE:                                HISTORY & PHYSICAL   PRIMARY CARE PHYSICIAN:  Dr. Su Hilt.   CHIEF COMPLAINT:  Chest pain.   This is a 65 year old female who had been in her previous state of good  health until this a.m. when she began to have substernal chest pain,  nonradiating, rated at a 6/10, lasting approximately an hour and a half  until arriving in the emergency department  and receiving sublingual  nitroglycerin times one. The patient reports the chest pain began shortly  after eating her breakfast which was cereal this a.m. She denies having any  previous episodes of this type of pain. The pain has been described as being  dull, along with sharp at intervals. She denies having any nausea, vomiting,  diaphoresis, or shortness of breath associated with this. She does have a  history of hyperlipidemia, hypothyroidism, and a family history of her  mother having coronary artery disease.   REVIEW OF SYSTEMS:  Pertinent as mentioned above.   PAST MEDICAL HISTORY:  As mentioned above. Also with migraine headaches. 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 She also has a history of tubal ligation.   MEDICATIONS:  1.  Synthroid 88  mcg one p.o. daily.  2.  Fiorinal p.r.n. migraine headaches.  3.  Over-the-counter Omega-3 fatty acids.  4.  Multivitamin.   ALLERGIES:  No known drug allergies, however has intolerance to Statin  medications that cause myalgias, which are severe.   SOCIAL HISTORY:  The patient is married, nonsmoker, nondrinker.   PHYSICAL EXAMINATION:  GENERAL: A pleasant 66 year old female in no  discomfort  or acute distress.  VITAL SIGNS: Temperature 98.2, blood pressure 125/65, heart rate 62 to 84,  respirations 18, O2 saturation 98% to 100%.  HEENT: Normocephalic and atraumatic. Extraocular movements are intact.  Funduscopic examination without exudates, hemorrhages, or AV nicking.  Tympanic membranes are clear bilaterally. Oropharynx is clear. Dentition is  good.  NECK: Supple with full range of motion. No thyromegaly, adenopathy, or  jugular venous distention. No carotid bruits.  CHEST WALL: Nontender. No unusual masses. No breast masses.  CARDIOVASCULAR: Regular rate and rhythm. No murmurs, rubs, or gallops  detected.  LUNGS: Clear to auscultation bilaterally.  No rales, rhonchi, or wheezes.  ABDOMEN: Positive bowel sounds, soft,  nontender, nondistended. No  hepatosplenomegaly.  EXTREMITIES: Large skin defect of the left anterior thigh area. Linear and  perpendicular across the thigh plane with approximately 1 cm consistent with  a surgical defect from melanoma excision.  NEUROLOGIC: Nonfocal. The patient is alert and oriented times three. Speech  is clear.   LABORATORY STUDIES:  CBC reveals white blood cell count of 5.1, hemoglobin  12.9, hematocrit 37.6,  platelet count 351,000, neutrophils 58%. Chemistry  reveals sodium of 140, potassium 4.1, chloride 109, CO2 24, BUN 15,  creatinine 0.6, glucose 131, albumin 3.5, calcium 9.4. Liver function tests  all within normal limits. AST 19, ALT 19, alkaline phosphatase 66, total  bilirubin 0.5, total protein 6.2. Cardiac enzymes reveal myoglobin 39.1, CK-  MB 1.2, troponin less than 0.05.  Negative first enzymes. Protime 12.0, INR  0.9, and PTT 30.   EKG study revealed a normal sinus rhythm with no acute ST segment changes.   ASSESSMENT:  This is a 65 year old female with substernal chest pain  admitted for further evaluation secondary to her risk factors.  1.  Chest pain. Rule out myocardial infarction. The differential diagnosis,       however, includes atypical causes including gastroesophageal etiologies.  2.  History of hyperlipidemia.  3.  History of hypothyroidism.  4.  History of migraine headaches.   PLAN:  The patient is being admitted for cardiac monitoring and will  continue with  a cardiac enzyme profile every 8 hours times three for 24  hours. She has been placed on nasal cannula oxygen, nitroglycerin paste,  morphine IV for pain along with Protonix for GI prophylaxis. She will  continue on her regular medications, DVT prophylaxis has been ordered at  this time. Lovenox therapy will be increased for MI treatment prevention  pending blood test results.      Della Goo, M.D.  Electronically Signed     HJ/MEDQ  D:  02/27/2006  T:  02/27/2006  Job:  [id number]

## 2011-02-05 NOTE — Discharge Summary (Signed)
[name], [name]                 ACCOUNT NO.:  [id number]   MEDICAL RECORD NO.:  [id number]          PATIENT TYPE:  EMS   LOCATION:  ED                           [facility]ACILITY:  [facility]   PHYSICIAN:  Mobolaji B. Bakare, M.D.DATE OF BIRTH:  July 05, 1946   DATE OF ADMISSION:  02/27/2006  DATE OF DISCHARGE:  02/28/2006                                 DISCHARGE SUMMARY   PRIMARY CARE PHYSICIAN:  Dr. Su Hilt at [facility]   FINAL DIAGNOSES:  1.  Chest pain.  2.  Hypercholesterolemia.  3.  Hyperglycemia.  4.  Migraine headaches.  5.  Hypothyroidism.   PROCEDURES:  Chest x-ray showed no acute cardiopulmonary disease.   BRIEF HISTORY:  Please refer tot he admission H&P.  In brief, [name] is  a 65 year old Caucasian female presenting with chest pain which started  after having had breakfast yesterday morning.  It was retrosternal.  There  was no sensation of heartburn.  Pain was nonradiating.  It was 6/10 and  lasted approximately 1-and-a-half hours.  The patient was given one  sublingual nitroglycerin.  She came to the emergency room, was evaluated  with an EKG which showed no acute ST changes.  Chest x-ray was normal.  There was no nausea, vomiting, or diaphoresis.  She does have some risk  factors including hyperlipidemia and family history of CAD.   HOSPITAL COURSE:  #1 - She was admitted and had serial cardiac enzymes.  Three sets of cardiac enzymes were negative for MI.  They were essentially  normal.  Since the first dose nitroglycerin sublingual the patient has not  had any recurrent chest pain.  Given risk factors, a decision was made to  ask cardiology for evaluation for possible stress test.  Unfortunately, the  patient is currently having a migraine headache which is essentially  resolving.  However, she feels uncomfortable to do a Cardiolite stress test  at this time and she would like to pursue these as an outpatient.  I have  instructed her to follow up  with Dr. Reyes Ivan (husband's cardiologist and the  patient wishes to see him).  She would continue on aspirin 325 mg daily.   #2 - HYPERGLYCEMIA.  She was noted to have a blood glucose of 154 on blood  drawn this morning.  She does not have history of diabetes mellitus.  The  patient is willing to be discharged home and this can be followed up as  outpatient by Dr. Su Hilt.   #3 - HYPERLIPIDEMIA.  She is currently on fish oil and niacin.  The patient  had trouble with statin in the past.  LDL is still uncontrolled - 185.  I  have discussed this with Dr. Su Hilt.  The patient will follow with him  regarding further adjustment of medications.   #4 - MIGRAINE HEADACHES.  This is responding to Fiorinal.  She will continue  with these as needed.   DISCHARGE MEDICATIONS:  1.  Aspirin 325 mg daily.  2.  Synthroid 88 mcg daily.  3.  Fiorinal p.r.n.  4.  Omega 3 fatty  acids fish oil.  5.  Multivitamin.   RECOMMENDATIONS:  1.  Follow up with cardiologist (Dr. Reyes Ivan) for Cardiolite stress test.  2.  To have hemoglobin A1c and fasting blood sugar done at next visit by Dr.      Su Hilt' office.  3.  Adjustment to cholesterol-lowering agent medication.   PERTINENT LABORATORY DATA:  Cardiac enzymes x3 sets all normal.  Total  cholesterol 278, HDL 82, LDL 185.  Sodium 141, potassium 4.3, chloride 107,  bicarb 27, BUN 14, creatinine 0.6, blood glucose 154, calcium 9.5.  White  cell 9.4, hemoglobin 13.3, hematocrit 38, platelets 347.  Magnesium 2.0.      Mobolaji B. Corky Downs, M.D.  Electronically Signed     MBB/MEDQ  D:  02/28/2006  T:  02/28/2006  Job:  [id number]   cc:   Dr. Shawna Orleans [facility]

## 2011-02-05 NOTE — Op Note (Signed)
[place]  Patient:    [name]                         MRN: [id number] Proc. Date: 10/15/99 Adm. Date:  [id number] Disc. Date: [id number] Attending:  Arlis Porta CC:         Adolph Pollack, M.D.                           Operative Report  PREOPERATIVE DIAGNOSIS:  Malignant melanoma, left thigh, Clark level IV.  POSTOPERATIVE DIAGNOSIS:  Malignant melanoma, left thigh, Clark level IV.  PROCEDURE: 1. A wide local excision of a 1.5 cm malignant melanoma, left thigh, Clark    level IV. 2. Complex repair of an 18.0 cm left thigh wound.  SURGEON:  Mary A. Contogiannis, M.D.  ASSISTANT:  None.  ANESTHESIA:  General endotracheal.  ESTIMATED BLOOD LOSS:  50 cc.  COMPLICATIONS:  None.  INDICATIONS:  The patient is a 65 year old white female who underwent an excision of a suspicious mole by Dr. Antony Madura on the left thigh.  The pathology  report indicated that it was a malignant melanoma with a 1.3 mm depth, which made it a Clark level IV.  The patient was referred to me regarding a wide local excision.  I then referred the patient to see Dr. Rose Phi. Ennever for counseling and appropriate staging of her melanoma.  We felt that the patient needed a sentinel node biopsy, so Dr. Adolph Pollack was asked to do that.  Today in the operating room Dr. Abbey Chatters has proceeded with the sentinel lymph node biopsy, and I am now getting ready to begin the excision of the malignant melanoma.  DESCRIPTION OF PROCEDURE:  The patient was lying supine on the operating room table.  The left leg had been prepped circumferentially with Betadine and draped in a sterile fashion.  An elliptical incision was made around the biopsy site and remaining melanoma, proving for 3.0 cm margins in all directions.  The skin and  subcutaneous tissues around this were then injected with 1% lidocaine with epinephrine.  After adequate  hemostasis had taken effect, the procedure was begun. The skin incision was made using the #10 blade knife, and carried down through he subcutaneous tissues, down to the fascial layer.  This was continued with the Bovie electrocautery for dissection.  A full thickness excision of the skin and soft tissues, down to just above the fascia was carried down.  The fascia was not taken. Meticulous hemostasis was obtained.  In order to close this wound, there had to be extensive undermining down to the level of the knee, as well as up to the inguinal area, and medially and laterally.  The melanoma had been located on the anterior aspect of the thigh.  After the undermining was done, the wound was able to be primarily closed.  The wound was irrigated with copious amounts of saline irrigation and meticulous hemostasis obtained with the Bovie electrocautery.  A #7 JP flat Foley fluted Blake drain was then placed into the wound and brought out  from just above the knee.  The wound was then closed in multiple layers.  First the deeper fascia layer was closed using #0 Prolene interrupted simple sutures. Next the superficial fascia layer was also closed using a #0 Prolene interrupted simple sutures.  Next the superficial fascial layer was also  closed using #0 Prolene interrupted simple sutures.  The skin was closed using #3-0 monocryl running subdermal suture, followed by #4-0 intercuticular monocryl suture on the skin. the incision was dressed with benzoin and Steri-Strips.  The drain was sutured into  place with a #3-0 nylon stitch.  At this point Dr. Abbey Chatters had closed his incision, but I then dressed it with benzoin and Steri-Strips, along with a light gauze dressing, and a light gauze dressing over the rest of the incisions.  The patients leg had been fully extended to take tension off of it during part of the closure, but relaxed as needed. At this point she was placed in a knee  immobilizer for postoperative immobilization on the leg, to avoid tension on the wound closure.  There were no complications.  The patient tolerated the procedure well.  The final needle and sponge counts were reported to be correct at the end of the case.  She was then extubated and taken to the recovery room in stable condition.  She will be admitted overnight as an outpatient with an observation stay in the hospital.  DISPOSITION:  A discharge plan for the morning. DD:  10/15/99 TD:  10/16/99 Job: [id number]

## 2011-02-05 NOTE — Consult Note (Signed)
NAMEAYANNA, GHEEN NO.:  [id number]   MEDICAL RECORD NO.:  [id number]          PATIENT TYPE:  EMS   LOCATION:  ED                           FACILITY:  [facility]   PHYSICIAN:  Elmore Guise., M.D.DATE OF BIRTH:  1946/05/08   DATE OF CONSULTATION:  02/28/2006  DATE OF DISCHARGE:  02/28/2006                                   CONSULTATION   INDICATIONS FOR CONSULTATION:  New onset chest pain.   PRIMARY CARE PHYSICIAN:  Dr. Su Hilt.   HISTORY OF PRESENT ILLNESS:  The patient is a very pleasant 65 year old  white female with past medical history of migraine headaches,  hypothyroidism, and gastroesophageal reflux disease, who presented to the  [facility] emergency department after she started having  substernal chest pain. She stated this lasted approximately 1/2 an hour,  felt initially as a sharp area in her epigastric area and then became a  tightness in her lower chest. It was associated with mild dyspnea. She went  up and did her normal activities around the house. No significant worsening,  however, no improvement. She has been having off and on migraine headaches  and had been taking some Fiorinal. Her pain actually started after she  finished a meal. When she got to the emergency room, she was given an  aspirin and nitroglycerin with slow improvement in her symptoms. She has now  been chest pain free for the last couple of hours. She denies any  palpitations, pre-syncope, or syncope. No recent fever, chills, nausea,  vomiting, or diarrhea. No cough, no orthopnea, or PND. No significant lower  extremity edema. Her weight has been up and down. Denies any pleuritic  worsening.   CURRENT MEDICATIONS:  1.  Synthroid 88 mcg daily.  2.  Fiorinal p.r.n.  3.  Over-the-counter Omega 3.  4.  Multivitamin.   ALLERGIES:  NO KNOWN DRUG ALLERGIES. However, she does have an intolerance  to STATIN medications, which cause severe myalgias.   SOCIAL  HISTORY:  She is married. Does not smoke or drink alcohol.   FAMILY HISTORY:  Positive for coronary disease.   PAST SURGICAL HISTORY:  Includes melanoma removal of her left leg.   PHYSICAL EXAMINATION:  VITAL SIGNS:  Blood pressure 125/60, heart rate 60,  respiratory rate 18, O2 saturation 98%. She is afebrile with a temperature  of 98.2.  GENERAL:  A very pleasant white female. Alert and oriented times four. No  acute distress. She has no JVD and no bruits.  HEENT:  Appear normal.  CHEST:  Non-tender.  LUNGS:  Clear.  HEART:  Regular with a normal S1 and S2. No significant murmur, rub, or  gallop.  ABDOMEN:  Soft, nontender, and nondistended. No rebound or guarding.  EXTREMITIES:  Warm with 2+ pulses and no significant edema.  NEUROLOGIC:  Cranial nerves are intact with no focal deficits.   LABORATORY DATA:  EKG was reviewed and showed normal sinus rhythm, normal  axis, normal intervals with no significant ST or T wave changes.   Blood work showed white count of 9.4. Hemoglobin  of 13.3 and platelet count  of 347,000. She had normal coag's with a PT of 12.0, INR of 0.9, and PTT of  30. She has a sodium of 141, potassium 4.5, glucose 154. BUN and creatinine  of 14 and 0.6.  Normal liver function studies. Her CPK's are 50, 46, and 33  with an MB of 1.0, 0.9, and 0.7 and troponin I's of 0.03, 0.03, and 0.03.  Her total cholesterol was 278, triglycerides of 55, LDL of 185, and HDL of  82. Her TSH was 1.43.   A chest x-ray showed no acute cardiopulmonary disease.   IMPRESSION:  1.  New onset chest pain.  2.  History of dyslipidemia.  3.  Mildly elevated blood glucose on blood work.   PLAN:  At this time I do think it is safe for discharge. The patient will be  discharged back to home. She was instructed not to do any strenuous activity  until she has a stress test. Her stress test will be scheduled for this  Friday, March 04, 2006, to evaluate for any significant ischemic heart   disease. She was told to start an aspirin, full dose, once daily and was  given a prescription for nitroglycerin to use on a p.r.n. basis. She is to  notify the office should she have any further problems. When she comes for  her stress test, we plan to discuss Zetia for treatment of her lipids.      Elmore Guise., M.D.  Electronically Signed     TWK/MEDQ  D:  02/28/2006  T:  02/28/2006  Job:  [id number]

## 2011-02-05 NOTE — Op Note (Signed)
[place]  Patient:    [name]                         MRN: [id number] Proc. Date: 10/15/99 Adm. Date:  [id number] Disc. Date: [id number] Attending:  Arlis Porta CC:         Michael Boston, M.D.             Antony Madura, M.D.             Rose Phi. Myna Hidalgo, M.D.                           Operative Report  PREOPERATIVE DIAGNOSIS:  Superficial spreading melanoma - left eye.  POSTOPERATIVE DIAGNOSIS:  Superficial spreading melanoma - left eye.  PROCEDURES: 1. Lymphatic mapping. 2. Injection of blue dye. 3. Left inguinal Sentinel lymph node biopsy.  SURGEON:  Adolph Pollack, M.D.  ANESTHESIA:  General.  INDICATIONS:  This 65 year old female had excision of a mole to her left eye. his was found to be a superficial spreading melanoma of 1.3 mm thickness.  The patient is sent up to have a wide local excision by Dr. Cecile Sheerer and a Sentinel lymph  node biopsy by me.  TECHNIQUE:  The patient was injected with radioactive isotope in a holding area  around it.  She is brought to the operating room and undergoing general anesthetic. Approximately one hour after injection lymphatic mapping was performed with the Neo probe.  Background was zero and there was an area in the left groin which measured 35 counts.  This was marked.  Subsequently lipozurian blue was injected in four  quadrants around the lesion and massaged for five minutes.  Next, the left lower extremity was sterilely prepped and draped.  Using a Neo probe with a sterile cover on it, once again lymphatic mapping was performed, beginning at the site.  This measured very high counts all the way into the left inguinal hernia, where again the 35 count measurement was located. The incision was made in a wide fashion directly over this, and was carried to the subcutaneous fat.  I noticed a blue lymphatic and followed this to a blue lymph  node; this was  hot and measured approximately 68-70 counts.  This was grabbed with the grasp and Babcock forceps and dissected free.  The lymphatic was ligated with 3.0 Vicryl tie.  Again, the node was blue and measured 68-70 counts.  When I reintroduced the Neo probe to the wound it was 0-2, which was consistent with background.  The node was sent directly to pathology.  Once hemostasis was obtained the wound was closed in two layers.  Closure was obtained of the subcutaneous fat with a running 3.0 Vicryl, the skin with a 4.0 Vicryl subcuticular stitch.  Steri-Strips and sterile dressing were applied.  She tolerated the procedure well without any apparent complications. Dr. Cecile Sheerer is going to proceed with the wide local excision. DD:  10/15/99 TD:  10/16/99 Job: [id number]

## 2011-03-05 ENCOUNTER — Other Ambulatory Visit: Payer: Self-pay | Admitting: Dermatology

## 2013-02-19 ENCOUNTER — Other Ambulatory Visit: Payer: Self-pay | Admitting: Internal Medicine

## 2013-02-19 DIAGNOSIS — R1310 Dysphagia, unspecified: Secondary | ICD-10-CM

## 2013-02-22 ENCOUNTER — Ambulatory Visit
Admission: RE | Admit: 2013-02-22 | Discharge: 2013-02-22 | Disposition: A | Discharge status: Home / Self Care | Payer: Medicare Other | Source: Ambulatory Visit | Attending: Internal Medicine | Admitting: Internal Medicine

## 2013-02-22 DIAGNOSIS — R1310 Dysphagia, unspecified: Secondary | ICD-10-CM

## 2013-04-24 ENCOUNTER — Ambulatory Visit
Admission: RE | Admit: 2013-04-24 | Discharge: 2013-04-24 | Disposition: A | Discharge status: Home / Self Care | Payer: Medicare Other | Source: Ambulatory Visit | Attending: Internal Medicine | Admitting: Internal Medicine

## 2013-04-24 ENCOUNTER — Other Ambulatory Visit: Payer: Self-pay | Admitting: Internal Medicine

## 2013-04-24 DIAGNOSIS — M778 Other enthesopathies, not elsewhere classified: Secondary | ICD-10-CM

## 2013-04-24 DIAGNOSIS — M25532 Pain in left wrist: Secondary | ICD-10-CM

## 2013-07-10 ENCOUNTER — Encounter: Payer: Self-pay | Admitting: Cardiovascular Disease

## 2013-07-10 ENCOUNTER — Encounter: Payer: Self-pay | Admitting: *Deleted

## 2013-07-10 DIAGNOSIS — R1310 Dysphagia, unspecified: Secondary | ICD-10-CM | POA: Insufficient documentation

## 2013-07-11 ENCOUNTER — Ambulatory Visit (INDEPENDENT_AMBULATORY_CARE_PROVIDER_SITE_OTHER): Payer: Medicare Other | Admitting: Cardiovascular Disease

## 2013-07-11 ENCOUNTER — Encounter: Payer: Self-pay | Admitting: Cardiovascular Disease

## 2013-07-11 VITALS — BP 130/84 | HR 67 | Ht 65.0 in | Wt 159.0 lb

## 2013-07-11 DIAGNOSIS — G43909 Migraine, unspecified, not intractable, without status migrainosus: Secondary | ICD-10-CM

## 2013-07-11 DIAGNOSIS — I4949 Other premature depolarization: Secondary | ICD-10-CM

## 2013-07-11 DIAGNOSIS — I493 Ventricular premature depolarization: Secondary | ICD-10-CM | POA: Insufficient documentation

## 2013-07-11 DIAGNOSIS — E0590 Thyrotoxicosis, unspecified without thyrotoxic crisis or storm: Secondary | ICD-10-CM

## 2013-07-11 NOTE — Assessment & Plan Note (Signed)
Normal TSH no recent change in doses

## 2013-07-11 NOTE — Assessment & Plan Note (Signed)
Try limit use of caffeine

## 2013-07-11 NOTE — Assessment & Plan Note (Signed)
Already on atenolol so no reason to check holter for frequency since she is asymptomatic  F/U stress echo to r/o structural heart disease and exercise induced VT

## 2013-07-11 NOTE — Progress Notes (Signed)
Patient ID: [name], female   DOB: 1945/12/25, 67 y.o.   MRN: [id number] 67 yo referred by Dr Su Hilt for PVC;s Noted by nurse when she went to give blood.  Asymptomatic  No chest pain palpitations syncope or dyspnea.  Long standing low thyroid with normal TSH 2.1 this month Labs from Dr Su Hilt reviewed.  She does take some medicine for her mirgrains that has cafeine in it. No ETOH or other stimulants. No history of heart problems. Previous abnormal chest CT followed by Dr Delton Coombes but adenopathy resolved  No family history of sudden death    Hct 42 K 4.4  Normal LFT;s  LDL 142  ROS: Denies fever, malais, weight loss, blurry vision, decreased visual acuity, cough, sputum, SOB, hemoptysis, pleuritic pain, palpitaitons, heartburn, abdominal pain, melena, lower extremity edema, claudication, or rash.  All other systems reviewed and negative   General: Affect appropriate Healthy:  appears stated age HEENT: normal Neck supple with no adenopathy JVP normal no bruits no thyromegaly Lungs clear with no wheezing and good diaphragmatic motion Heart:  S1/S2 no murmur,rub, gallop or click PMI normal Abdomen: benighn, BS positve, no tenderness, no AAA no bruit.  No HSM or HJR Distal pulses intact with no bruits No edema Neuro non-focal Skin warm and dry No muscular weakness  Medications Current Outpatient Prescriptions  Medication Sig Dispense Refill  . ASPIRIN PO Take by mouth daily.      Marland Kitchen atenolol (TENORMIN) 50 MG tablet Take one tablet daily      . butalbital-aspirin-caffeine (FIORINAL) 50-325-40 MG per capsule Take up to 4 tablet daily as needed      . cholecalciferol (VITAMIN D) 1000 UNITS tablet Take 1,000 Units by mouth daily.      . folic acid (FOLVITE) 400 MCG tablet Take 400 mcg by mouth daily.      Marland Kitchen levothyroxine (SYNTHROID, LEVOTHROID) 100 MCG tablet Take one tablet daily      . Multiple Vitamins-Minerals (MULTIVITAMIN WITH MINERALS) tablet Take 1 tablet by mouth daily.       Marland Kitchen OVER THE COUNTER MEDICATION Take  one tablet of vitamin B daily      . ranitidine (ZANTAC) 150 MG capsule Take 300 mg by mouth 2 (two) times daily.      . vitamin C (ASCORBIC ACID) 500 MG tablet Take 500 mg by mouth daily.      . vitamin E 400 UNIT capsule Take 400 Units by mouth daily.       No current facility-administered medications for this visit.    Allergies Penicillins; Quinolones; and Statins  Family History: No family history on file.  Social History: History   Social History  . Marital Status: Married    Spouse Name: N/A    Number of Children: N/A  . Years of Education: N/A   Occupational History  . Not on file.   Social History Main Topics  . Smoking status: Former Games developer  . Smokeless tobacco: Not on file  . Alcohol Use: No  . Drug Use: No  . Sexual Activity: Not on file   Other Topics Concern  . Not on file   Social History Narrative  . No narrative on file    Electrocardiogram: 05/30/13 SR unifocal PVC otherwise normal today no change SR rate 67 PVC otherwise normal QT 410   Assessment and Plan

## 2013-07-11 NOTE — Patient Instructions (Signed)
Your physician recommends that you schedule a follow-up appointment in:   AS NEEDED   Your physician recommends that you continue on your current medications as directed. Please refer to the Current Medication list given to you today.   Your physician has requested that you have a stress echocardiogram. For further information please visit [email or web address]. Please follow instruction sheet as given.  

## 2013-07-31 ENCOUNTER — Institutional Professional Consult (permissible substitution): Payer: Medicare Other | Admitting: Cardiovascular Disease

## 2013-07-31 ENCOUNTER — Ambulatory Visit (HOSPITAL_COMMUNITY): Payer: Medicare Other | Attending: Cardiology

## 2013-07-31 ENCOUNTER — Encounter: Payer: Self-pay | Admitting: Cardiovascular Disease

## 2013-07-31 ENCOUNTER — Ambulatory Visit (HOSPITAL_BASED_OUTPATIENT_CLINIC_OR_DEPARTMENT_OTHER): Payer: Medicare Other

## 2013-07-31 ENCOUNTER — Encounter: Payer: Self-pay | Admitting: Cardiology

## 2013-07-31 DIAGNOSIS — I498 Other specified cardiac arrhythmias: Secondary | ICD-10-CM | POA: Insufficient documentation

## 2013-07-31 DIAGNOSIS — I4949 Other premature depolarization: Secondary | ICD-10-CM | POA: Insufficient documentation

## 2013-07-31 DIAGNOSIS — Z87891 Personal history of nicotine dependence: Secondary | ICD-10-CM | POA: Insufficient documentation

## 2013-07-31 DIAGNOSIS — I493 Ventricular premature depolarization: Secondary | ICD-10-CM

## 2013-07-31 DIAGNOSIS — R0989 Other specified symptoms and signs involving the circulatory and respiratory systems: Secondary | ICD-10-CM

## 2013-07-31 NOTE — Progress Notes (Signed)
Stress echocardiogram performed. 

## 2013-08-02 ENCOUNTER — Telehealth: Payer: Self-pay | Admitting: Cardiovascular Disease

## 2013-08-02 NOTE — Telephone Encounter (Signed)
Pt is aware of stress echo results. Results faxed to Dr.Roberts MD. To fax # [phone]. Pt aware.

## 2013-08-02 NOTE — Telephone Encounter (Signed)
Follow Up ° °Pt calling for results °

## 2014-03-08 ENCOUNTER — Ambulatory Visit (INDEPENDENT_AMBULATORY_CARE_PROVIDER_SITE_OTHER): Payer: Medicare Other

## 2014-03-08 ENCOUNTER — Ambulatory Visit (INDEPENDENT_AMBULATORY_CARE_PROVIDER_SITE_OTHER): Payer: Medicare Other | Admitting: Internal Medicine

## 2014-03-08 VITALS — BP 140/100 | HR 76 | Temp 97.9°F | Resp 18 | Ht 65.0 in | Wt 159.4 lb

## 2014-03-08 DIAGNOSIS — M2390 Unspecified internal derangement of unspecified knee: Secondary | ICD-10-CM

## 2014-03-08 DIAGNOSIS — IMO0002 Reserved for concepts with insufficient information to code with codable children: Secondary | ICD-10-CM

## 2014-03-08 DIAGNOSIS — S86912A Strain of unspecified muscle(s) and tendon(s) at lower leg level, left leg, initial encounter: Secondary | ICD-10-CM

## 2014-03-08 DIAGNOSIS — S86911A Strain of unspecified muscle(s) and tendon(s) at lower leg level, right leg, initial encounter: Secondary | ICD-10-CM

## 2014-03-08 DIAGNOSIS — Y92009 Unspecified place in unspecified non-institutional (private) residence as the place of occurrence of the external cause: Secondary | ICD-10-CM

## 2014-03-08 DIAGNOSIS — M2391 Unspecified internal derangement of right knee: Secondary | ICD-10-CM

## 2014-03-08 DIAGNOSIS — M25562 Pain in left knee: Secondary | ICD-10-CM

## 2014-03-08 DIAGNOSIS — M25461 Effusion, right knee: Secondary | ICD-10-CM

## 2014-03-08 DIAGNOSIS — M25569 Pain in unspecified knee: Secondary | ICD-10-CM

## 2014-03-08 DIAGNOSIS — W19XXXA Unspecified fall, initial encounter: Secondary | ICD-10-CM

## 2014-03-08 DIAGNOSIS — M25469 Effusion, unspecified knee: Secondary | ICD-10-CM

## 2014-03-08 MED ORDER — HYDROCODONE-ACETAMINOPHEN 5-325 MG PO TABS: 1.0000 | ORAL_TABLET | Freq: Four times a day (QID) | ORAL | Status: DC | PRN

## 2014-03-08 NOTE — Patient Instructions (Signed)
Meniscus Tear with Phase I Rehab The meniscus is a C-shaped cartilage structure, located in the knee joint between the thigh bone (femur) and the shinbone (tibia). Two menisci are located in each knee joint: the inner and outer meniscus. The meniscus acts as an adapter between the thigh bone and shinbone, allowing them to fit properly together. It also functions as a shock absorber, to reduce the stress placed on the knee joint and to help supply nutrients to the knee joint cartilage. As people age, the meniscus begins to harden and become more vulnerable to injury. Meniscus tears are a common injury, especially in older athletes. Inner meniscus tears are more common than outer meniscus tears.  SYMPTOMS   Pain in the knee, especially with standing or squatting with the affected leg.  Tenderness along the joint line.  Swelling in the knee joint (effusion), usually starting 1 to 2 days after injury.  Locking or catching of the knee joint, causing inability to straighten the knee completely.  Giving way or buckling of the knee. CAUSES  A meniscus tear occurs when a force is placed on the meniscus that is greater than it can handle. Common causes of injury include:  Direct hit (trauma) to the knee.  Twisting, pivoting, or cutting (rapidly changing direction while running), kneeling or squatting.  Without injury, due to aging. RISK INCREASES WITH:  Contact sports (football, rugby).  Sports in which cleats are used with pivoting (soccer, lacrosse) or sports in which good shoe grip and sudden change in direction are required (racquetball, basketball, squash).  Previous knee injury.  Associated knee injury, particularly ligament injuries.  Poor strength and flexibility. PREVENTION  Warm up and stretch properly before activity.  Maintain physical fitness:  Strength, flexibility, and endurance.  Cardiovascular fitness.  Protect the knee with a brace or elastic bandage.  Wear  properly fitted protective equipment (proper cleats for the surface). PROGNOSIS  Sometimes, meniscus tears heal on their own. However, definitive treatment requires surgery, followed by at least 6 weeks of recovery.  RELATED COMPLICATIONS   Recurring symptoms that result in a chronic problem.  Repeated knee injury, especially if sports are resumed too soon after injury or surgery.  Progression of the tear (the tear gets larger), if untreated.  Arthritis of the knee in later years (with or without surgery).  Complications of surgery, including infection, bleeding, injury to nerves (numbness, weakness, paralysis) continued pain, giving way, locking, nonhealing of meniscus (if repaired), need for further surgery, and knee stiffness (loss of motion). TREATMENT  Treatment first involves the use of ice and medicine, to reduce pain and inflammation. You may find using crutches to walk more comfortable. However, it is okay to bear weight on the injured knee, if the pain will allow it. Surgery is often advised as a definitive treatment. Surgery is performed through an incision near the joint (arthroscopically). The torn piece of the meniscus is removed, and if possible the joint cartilage is repaired. After surgery, the joint must be restrained. After restraint, it is important to perform strengthening and stretching exercises to help regain strength and a full range of motion. These exercises may be completed at home or with a therapist.  MEDICATION  If pain medicine is needed, nonsteroidal anti-inflammatory medicines (aspirin and ibuprofen), or other minor pain relievers (acetaminophen), are often advised.  Do not take pain medicine for 7 days before surgery.  Prescription pain relievers may be given, if your caregiver thinks they are needed. Use only as directed and   only as much as you need. HEAT AND COLD  Cold treatment (icing) should be applied for 10 to 15 minutes every 2 to 3 hours for  inflammation and pain, and immediately after activity that aggravates your symptoms. Use ice packs or an ice massage.  Heat treatment may be used before performing stretching and strengthening activities prescribed by your caregiver, physical therapist, or athletic trainer. Use a heat pack or a warm water soak. SEEK MEDICAL CARE IF:   Symptoms get worse or do not improve in 2 weeks, despite treatment.  New, unexplained symptoms develop. (Drugs used in treatment may produce side effects.) EXERCISES RANGE OF MOTION (ROM) AND STRETCHING EXERCISES - Meniscus Tear, Non-operative, Phase I These are some of the initial exercises with which you may start your rehabilitation program, until you see your caregiver again or until your symptoms are resolved. Remember:   These initial exercises are intended to be gentle. They will help you restore motion without increasing any swelling.  Completing these exercises allows less painful movement and prepares you for the more aggressive strengthening exercises in Phase II.  An effective stretch should be held for at least 30 seconds.  A stretch should never be painful. You should only feel a gentle lengthening or release in the stretched tissue. RANGE OF MOTION - Knee Flexion, Active  Lie on your back with both knees straight. (If this causes back discomfort, bend your healthy knee, placing your foot flat on the floor.)  Slowly slide your heel back toward your buttocks until you feel a gentle stretch in the front of your knee or thigh.  Hold for __________ seconds. Slowly slide your heel back to the starting position. Repeat __________ times. Complete this exercise __________ times per day.  RANGE OF MOTION - Knee Flexion and Extension, Active-Assisted  Sit on the edge of a table or chair with your thighs firmly supported. It may be helpful to place a folded towel under the end of your right / left thigh.  Flexion (bending): Place the ankle of your  healthy leg on top of the other ankle. Use your healthy leg to gently bend your right / left knee until you feel a mild tension across the top of your knee.  Hold for __________ seconds.  Extension (straightening): Switch your ankles so your right / left leg is on top. Use your healthy leg to straighten your right / left knee until you feel a mild tension on the backside of your knee.  Hold for __________ seconds. Repeat __________ times. Complete __________ times per day. STRETCH - Knee Flexion, Supine  Lie on the floor with your right / left heel and foot lightly touching the wall. (Place both feet on the wall if you do not use a door frame.)  Without using any effort, allow gravity to slide your foot down the wall slowly until you feel a gentle stretch in the front of your right / left knee.  Hold this stretch for __________ seconds. Then return the leg to the starting position, using your healthy leg for help, if needed. Repeat __________ times. Complete this stretch __________ times per day.  STRETCH - Knee Extension Sitting  Sit with your right / left leg/heel propped on another chair, coffee table, or foot stool.  Allow your leg muscles to relax, letting gravity straighten out your knee.*  You should feel a stretch behind your right / left knee. Hold this position for __________ seconds. Repeat __________ times. Complete this stretch __________   times per day.  *Your physician, physical therapist or athletic trainer may instruct you place a __________ weight on your thigh, just above your kneecap, to deepen the stretch.  STRENGTHENING EXERCISES - Meniscus Tear, Non-operative, Phase I These exercises may help you when beginning to rehabilitate your injury. They may resolve your symptoms with or without further involvement from your physician, physical therapist or athletic trainer. While completing these exercises, remember:   Muscles can gain both the endurance and the strength  needed for everyday activities through controlled exercises.  Complete these exercises as instructed by your physician, physical therapist or athletic trainer. Progress the resistance and repetitions only as guided. STRENGTH - Quadriceps, Isometrics  Lie on your back with your right / left leg extended and your opposite knee bent.  Gradually tense the muscles in the front of your right / left thigh. You should see either your knee cap slide up toward your hip or increased dimpling just above the knee. This motion will push the back of the knee down toward the floor, mat, or bed on which you are lying.  Hold the muscle as tight as you can, without increasing your pain, for __________ seconds.  Relax the muscles slowly and completely between each repetition. Repeat __________ times. Complete this exercise __________ times per day.  STRENGTH - Quadriceps, Short Arcs   Lie on your back. Place a __________ inch towel roll under your right / left knee, so that the knee bends slightly.  Raise only your lower leg by tightening the muscles in the front of your thigh. Do not allow your thigh to rise.  Hold this position for __________ seconds. Repeat __________ times. Complete this exercise __________ times per day.  OPTIONAL ANKLE WEIGHTS: Begin with ____________________, but DO NOT exceed ____________________. Increase in 1 pound/0.5 kilogram increments. STRENGTH - Quadriceps, Straight Leg Raises  Quality counts! Watch for signs that the quadriceps muscle is working, to be sure you are strengthening the correct muscles and not "cheating" by substituting with healthier muscles.  Lay on your back with your right / left leg extended and your opposite knee bent.  Tense the muscles in the front of your right / left thigh. You should see either your knee cap slide up or increased dimpling just above the knee. Your thigh may even shake a bit.  Tighten these muscles even more and raise your leg 4 to 6  inches off the floor. Hold for __________ seconds.  Keeping these muscles tense, lower your leg.  Relax the muscles slowly and completely in between each repetition. Repeat __________ times. Complete this exercise __________ times per day.  STRENGTH - Hamstring, Curls   Lay on your stomach with your legs extended. (If you lay on a bed, your feet may hang over the edge.)  Tighten the muscles in the back of your thigh to bend your right / left knee up to 90 degrees. Keep your hips flat on the bed.  Hold this position for __________ seconds.  Slowly lower your leg back to the starting position. Repeat __________ times. Complete this exercise __________ times per day.  STRENGTH - Quadriceps, Squats  Stand in a door frame so that your feet and knees are in line with the frame.  Use your hands for balance, not support, on the frame.  Slowly lower your weight, bending at the hips and knees. Keep your lower legs upright so that they are parallel with the door frame. Squat only within the range that does  not increase your knee pain. Never let your hips drop below your knees.  Slowly return upright, pushing with your legs, not pulling with your hands. Repeat __________ times. Complete this exercise __________ times per day.  STRENGTH - Quad/VMO, Isometric   Sit in a chair with your right / left knee slightly bent. With your fingertips, feel the VMO muscle just above the inside of your knee. The VMO is important in controlling the position of your kneecap.  Keeping your fingertips on this muscle. Without actually moving your leg, attempt to drive your knee down as if straightening your leg. You should feel your VMO tense. If you have a difficult time, you may wish to try the same exercise on your healthy knee first.  Tense this muscle as hard as you can without increasing any knee pain.  Hold for __________ seconds. Relax the muscles slowly and completely in between each repetition. Repeat  __________ times. Complete exercise __________ times per day.  Document Released: 09/20/1998 Document Revised: 11/29/2011 Document Reviewed: 12/19/2008 [facility] LLCExitCare Patient Information 2015 [employer], [place]. This information is not intended to replace advice given to you by your health care provider. Make sure you discuss any questions you have with your health care provider. Fall Prevention and Home Safety Falls cause injuries and can affect all age groups. It is possible to use preventive measures to significantly decrease the likelihood of falls. There are many simple measures which can make your home safer and prevent falls. OUTDOORS  Repair cracks and edges of walkways and driveways.  Remove high doorway thresholds.  Trim shrubbery on the main path into your home.  Have good outside lighting.  Clear walkways of tools, rocks, debris, and clutter.  Check that handrails are not broken and are securely fastened. Both sides of steps should have handrails.  Have leaves, snow, and ice cleared regularly.  Use sand or salt on walkways during winter months.  In the garage, clean up grease or oil spills. BATHROOM  Install night lights.  Install grab bars by the toilet and in the tub and shower.  Use non-skid mats or decals in the tub or shower.  Place a plastic non-slip stool in the shower to sit on, if needed.  Keep floors dry and clean up all water on the floor immediately.  Remove soap buildup in the tub or shower on a regular basis.  Secure bath mats with non-slip, double-sided rug tape.  Remove throw rugs and tripping hazards from the floors. BEDROOMS  Install night lights.  Make sure a bedside light is easy to reach.  Do not use oversized bedding.  Keep a telephone by your bedside.  Have a firm chair with side arms to use for getting dressed.  Remove throw rugs and tripping hazards from the floor. KITCHEN  Keep handles on pots and pans turned toward the center of  the stove. Use back burners when possible.  Clean up spills quickly and allow time for drying.  Avoid walking on wet floors.  Avoid hot utensils and knives.  Position shelves so they are not too high or low.  Place commonly used objects within easy reach.  If necessary, use a sturdy step stool with a grab bar when reaching.  Keep electrical cables out of the way.  Do not use floor polish or wax that makes floors slippery. If you must use wax, use non-skid floor wax.  Remove throw rugs and tripping hazards from the floor. STAIRWAYS  Never leave objects on stairs.  Place handrails on both sides of stairways and use them. Fix any loose handrails. Make sure handrails on both sides of the stairways are as long as the stairs.  Check carpeting to make sure it is firmly attached along stairs. Make repairs to worn or loose carpet promptly.  Avoid placing throw rugs at the top or bottom of stairways, or properly secure the rug with carpet tape to prevent slippage. Get rid of throw rugs, if possible.  Have an electrician put in a light switch at the top and bottom of the stairs. OTHER FALL PREVENTION TIPS  Wear low-heel or rubber-soled shoes that are supportive and fit well. Wear closed toe shoes.  When using a stepladder, make sure it is fully opened and both spreaders are firmly locked. Do not climb a closed stepladder.  Add color or contrast paint or tape to grab bars and handrails in your home. Place contrasting color strips on first and last steps.  Learn and use mobility aids as needed. Install an electrical emergency response system.  Turn on lights to avoid dark areas. Replace light bulbs that burn out immediately. Get light switches that glow.  Arrange furniture to create clear pathways. Keep furniture in the same place.  Firmly attach carpet with non-skid or double-sided tape.  Eliminate uneven floor surfaces.  Select a carpet pattern that does not visually hide the  edge of steps.  Be aware of all pets. OTHER HOME SAFETY TIPS  Set the water temperature for 120 F (48.8 C).  Keep emergency numbers on or near the telephone.  Keep smoke detectors on every level of the home and near sleeping areas. Document Released: 08/27/2002 Document Revised: 03/07/2012 Document Reviewed: 11/26/2011 [facility]Exit[facility] Patient Information 2015 [place], [place]. This information is not intended to replace advice given to you by your health care provider. Make sure you discuss any questions you have with your health care provider.

## 2014-03-08 NOTE — Progress Notes (Signed)
   Subjective:    Patient ID: [name], female    DOB: 07-Sep-1946, 68 y.o.   MRN: [id number]  HPI  68 year old female presents to [facility] with right knee pain after fall injury yesterday at 12 pm. Put lotion on feet, slipped on an area rug in dining room, foot came out from under her, twisted entire body, landing directly on right knee.  Twisted right knee while falling. Has swelling and pain with flex and extention.  No previous knee problem.  Review of Systems     Objective:   Physical Exam  Constitutional: She is oriented to person, place, and time. She appears well-developed and well-nourished.  HENT:  Head: Normocephalic.  Eyes: EOM are normal.  Neck: Normal range of motion.  Cardiovascular: Normal rate.   Pulmonary/Chest: Effort normal.  Musculoskeletal:       Right knee: She exhibits decreased range of motion, swelling, effusion and abnormal meniscus. She exhibits no ecchymosis, no deformity, no laceration, no erythema, normal alignment, no LCL laxity, normal patellar mobility, no bony tenderness and no MCL laxity. Tenderness found. Medial joint line and MCL tenderness noted. No lateral joint line, no LCL and no patellar tendon tenderness noted.       Legs: Neurological: She is alert and oriented to person, place, and time. She exhibits normal muscle tone. Coordination and gait abnormal.  Using wheel chair  Skin: Bruising and ecchymosis noted.   All other joints are full rom and intact.  [facility] reading (PRIMARY) by  Dr.Konner Saiz mild djd no fx       Assessment & Plan:  RICE/Knee immobilizer/Crutches Vicodin 5/325 Dr. Lunette StandsAnna [place] orthopedist soon

## 2014-03-08 NOTE — Progress Notes (Signed)
   Subjective:    Patient ID: [name], female    DOB: Jul 13, 1946, 68 y.o.   MRN: [id number]  HPI    Review of Systems     Objective:   Physical Exam        Assessment & Plan:

## 2014-04-12 ENCOUNTER — Ambulatory Visit (INDEPENDENT_AMBULATORY_CARE_PROVIDER_SITE_OTHER): Payer: Medicare Other | Admitting: Family Medicine

## 2014-04-12 VITALS — BP 130/88 | HR 102 | Temp 97.4°F | Resp 18 | Ht 65.0 in | Wt 160.2 lb

## 2014-04-12 DIAGNOSIS — H9209 Otalgia, unspecified ear: Secondary | ICD-10-CM

## 2014-04-12 DIAGNOSIS — R000 Tachycardia, unspecified: Secondary | ICD-10-CM

## 2014-04-12 DIAGNOSIS — H9201 Otalgia, right ear: Secondary | ICD-10-CM

## 2014-04-12 DIAGNOSIS — K089 Disorder of teeth and supporting structures, unspecified: Secondary | ICD-10-CM

## 2014-04-12 DIAGNOSIS — K0889 Other specified disorders of teeth and supporting structures: Secondary | ICD-10-CM

## 2014-04-12 MED ORDER — HYDROCODONE-ACETAMINOPHEN 5-325 MG PO TABS: 1.0000 | ORAL_TABLET | Freq: Four times a day (QID) | ORAL | Status: DC | PRN

## 2014-04-12 NOTE — Progress Notes (Signed)
Subjective:    Patient ID: [name], female    DOB: 1945/10/02, 68 y.o.   MRN: [id number]  HPI [name] is a 68 y.o. female  Started with some soreness in R teeth- upper and lower on the right initially that progressed to ear pain last night. Pounding pain in R ear.Hearing ok, just pain at times. Feels like something in ear. No objective fever. No other URI sx's.  No topical drops in ear. No HA, no dizziness/vertigo. No rash in area or numbness.   Avoided eating on R side yesterday d/t pain in teeth. hot and cold sensitive on R side.  Dentist appt next month. No recent dental work.   Not having pain right now in office.   Has had elevated heart rate"white coat" in office. No palpitations/chest symptoms.   Tx: aspirin, and otc topical tooth ointment.   Patient Active Problem List   Diagnosis Date Noted  . PVC (premature ventricular contraction) 07/11/2013  . Dysphagia   . HYPERTHYROIDISM 07/17/2010  . MIGRAINE HEADACHE 07/17/2010  . LYMPHADENOPATHY 07/17/2010   Past Medical History  Diagnosis Date  . MIGRAINE HEADACHE   . HYPERTHYROIDISM   . LYMPHADENOPATHY   . Dysphagia    Past Surgical History  Procedure Laterality Date  . Tubal ligation    . Mohs surgery  2001   Allergies  Allergen Reactions  . Penicillins     REACTION: rash  . Quinolones   . Statins     REACTION: leg pain, muscle cramps   Prior to Admission medications   Medication Sig Start Date End Date Taking? Authorizing Provider  ASPIRIN PO Take by mouth daily.   Yes Historical Provider, MD  butalbital-aspirin-caffeine [facility]) 50-325-40 MG per capsule Take up to 4 tablet daily as needed 05/28/13  Yes Historical Provider, MD  cholecalciferol (VITAMIN D) 1000 UNITS tablet Take 1,000 Units by mouth daily.   Yes Historical Provider, MD  CYCLOBENZAPRINE HCL PO Take 10 mg by mouth 2 (two) times daily as needed.   Yes Historical Provider, MD  folic acid (FOLVITE) 400 MCG tablet Take 400 mcg by mouth  daily.   Yes Historical Provider, MD  levothyroxine (SYNTHROID, LEVOTHROID) 100 MCG tablet Take one tablet daily 06/12/13  Yes Historical Provider, MD  LORazepam (ATIVAN) 1 MG tablet Take 1 mg by mouth 3 (three) times daily as needed for anxiety.   Yes Historical Provider, MD  Multiple Vitamins-Minerals (MULTIVITAMIN WITH MINERALS) tablet Take 1 tablet by mouth daily.   Yes Historical Provider, MD  ranitidine (ZANTAC) 150 MG capsule Take 300 mg by mouth 2 (two) times daily.   Yes Historical Provider, MD  vitamin C (ASCORBIC ACID) 500 MG tablet Take 500 mg by mouth daily.   Yes Historical Provider, MD  vitamin E 400 UNIT capsule Take 400 Units by mouth daily.   Yes Historical Provider, MD   History   Social History  . Marital Status: Married    Spouse Name: N/A    Number of Children: N/A  . Years of Education: N/A   Occupational History  . Not on file.   Social History Main Topics  . Smoking status: Former Games developer  . Smokeless tobacco: Not on file  . Alcohol Use: No  . Drug Use: No  . Sexual Activity: Not on file   Other Topics Concern  . Not on file   Social History Narrative  . No narrative on file       Review of Systems  Constitutional: Positive for fever (possible subjecive yesterday - felt wamr, but no measured fever. ). Negative for chills.  HENT: Positive for dental problem (tooth pain initially on R upper/lower. ) and ear pain. Negative for ear discharge, hearing loss, postnasal drip, rhinorrhea and sinus pressure.   Skin: Negative for rash.  Neurological: Negative for dizziness, numbness and headaches.       Objective:   Physical Exam  Vitals reviewed. Constitutional: She is oriented to person, place, and time. She appears well-developed and well-nourished. No distress.  HENT:  Head: Normocephalic and atraumatic.  Right Ear: Hearing, external ear and ear canal normal. No drainage or swelling. Tympanic membrane is not injected and not erythematous. A middle ear  effusion (min clear fluid behind tm, but no redness, canal clear. ) is present.  Left Ear: Hearing, tympanic membrane, external ear and ear canal normal.  Nose: Nose normal.  Mouth/Throat: Uvula is midline, oropharynx is clear and moist and mucous membranes are normal. Dental caries (multiple fillings with recessed gum around 2nd molar upper right. no focal tooth ttp, and no gum swellling or erythema. ) present. No dental abscesses or uvula swelling. No oropharyngeal exudate, posterior oropharyngeal edema or posterior oropharyngeal erythema.  Eyes: Conjunctivae and EOM are normal. Pupils are equal, round, and reactive to light.  Cardiovascular: Normal rate, regular rhythm, normal heart sounds and intact distal pulses.   No murmur heard. Pulmonary/Chest: Effort normal and breath sounds normal. No respiratory distress. She has no wheezes. She has no rhonchi.  Neurological: She is alert and oriented to person, place, and time.  Skin: Skin is warm and dry. No rash noted.  Psychiatric: She has a normal mood and affect. Her behavior is normal.   Filed Vitals:   04/12/14 1032  BP: 130/88  Pulse: 115  Temp: 97.4 F (36.3 C)  TempSrc: Oral  Resp: 18  Height: 5\' 5"  (1.651 m)  Weight: 160 lb 3.2 oz (72.666 kg)  SpO2: 97%          Assessment & Plan:   Tabitha KettleDollie M Waldrep is a 68 y.o. female Pain, ear, right - Plan: HYDROcodone-acetaminophen (NORCO/VICODIN) 5-325 MG per tablet,  Tooth pain - Plan: HYDROcodone-acetaminophen (NORCO/VICODIN) 5-325 MG per tablet  - suspected radiating pain form teeth to ear. asx now. No rash, doubt zoster.  No sign of periodontal abscess at this time.   - sx care, avoidance of hot/cold foods and chewing on right, hydrocodone if needed (SED) and rtc precautions discussed.   Tachycardia  - white coat component possible. rtc/er precautions if persists.   Meds ordered this encounter  Medications  . CYCLOBENZAPRINE HCL PO    Sig: Take 10 mg by mouth 2 (two) times  daily as needed.  Marland Kitchen. LORazepam (ATIVAN) 1 MG tablet    Sig: Take 1 mg by mouth 3 (three) times daily as needed for anxiety.  Marland Kitchen. HYDROcodone-acetaminophen (NORCO/VICODIN) 5-325 MG per tablet    Sig: Take 1 tablet by mouth every 6 (six) hours as needed for moderate pain.    Dispense:  15 tablet    Refill:  0   Patient Instructions  I suspect your ear pain was radiating pain from the teeth.  Avoid hot/cold foods and chewing on that side for next 4-5 days. hydrocodone if needed, and if pain persists - follow up with dentist next week. Return to the clinic or go to the nearest emergency room if any of your symptoms worsen or new symptoms occur, including if any rash.  If heart rate not returning to normal at home - can return here, your primary provider or emergency room to have this evaluated.   Dental Pain A tooth ache may be caused by cavities (tooth decay). Cavities expose the nerve of the tooth to air and hot or cold temperatures. It may come from an infection or abscess (also called a boil or furuncle) around your tooth. It is also often caused by dental caries (tooth decay). This causes the pain you are having. DIAGNOSIS  Your caregiver can diagnose this problem by exam. TREATMENT   If caused by an infection, it may be treated with medications which kill germs (antibiotics) and pain medications as prescribed by your caregiver. Take medications as directed.  Only take over-the-counter or prescription medicines for pain, discomfort, or fever as directed by your caregiver.  Whether the tooth ache today is caused by infection or dental disease, you should see your dentist as soon as possible for further care. SEEK MEDICAL CARE IF: The exam and treatment you received today has been provided on an emergency basis only. This is not a substitute for complete medical or dental care. If your problem worsens or new problems (symptoms) appear, and you are unable to meet with your dentist, call or  return to this location. SEEK IMMEDIATE MEDICAL CARE IF:   You have a fever.  You develop redness and swelling of your face, jaw, or neck.  You are unable to open your mouth.  You have severe pain uncontrolled by pain medicine. MAKE SURE YOU:   Understand these instructions.  Will watch your condition.  Will get help right away if you are not doing well or get worse. Document Released: 09/06/2005 Document Revised: 11/29/2011 Document Reviewed: 04/24/2008 [facility] Patient Information 2015 [place], [place]. This information is not intended to replace advice given to you by your health care provider. Make sure you discuss any questions you have with your health care provider.   Otalgia The most common reason for this in children is an infection of the middle ear. Pain from the middle ear is usually caused by a build-up of fluid and pressure behind the eardrum. Pain from an earache can be sharp, dull, or burning. The pain may be temporary or constant. The middle ear is connected to the nasal passages by a short narrow tube called the Eustachian tube. The Eustachian tube allows fluid to drain out of the middle ear, and helps keep the pressure in your ear equalized. CAUSES  A cold or allergy can block the Eustachian tube with inflammation and the build-up of secretions. This is especially likely in small children, because their Eustachian tube is shorter and more horizontal. When the Eustachian tube closes, the normal flow of fluid from the middle ear is stopped. Fluid can accumulate and cause stuffiness, pain, hearing loss, and an ear infection if germs start growing in this area. SYMPTOMS  The symptoms of an ear infection may include fever, ear pain, fussiness, increased crying, and irritability. Many children will have temporary and minor hearing loss during and right after an ear infection. Permanent hearing loss is rare, but the risk increases the more infections a child has. Other causes of  ear pain include retained water in the outer ear canal from swimming and bathing. Ear pain in adults is less likely to be from an ear infection. Ear pain may be referred from other locations. Referred pain may be from the joint between your jaw and the skull. It may also  come from a tooth problem or problems in the neck. Other causes of ear pain include:  A foreign body in the ear.  Outer ear infection.  Sinus infections.  Impacted ear wax.  Ear injury.  Arthritis of the jaw or TMJ problems.  Middle ear infection.  Tooth infections.  Sore throat with pain to the ears. DIAGNOSIS  Your caregiver can usually make the diagnosis by examining you. Sometimes other special studies, including x-rays and lab work may be necessary. TREATMENT   If antibiotics were prescribed, use them as directed and finish them even if you or your child's symptoms seem to be improved.  Sometimes PE tubes are needed in children. These are little plastic tubes which are put into the eardrum during a simple surgical procedure. They allow fluid to drain easier and allow the pressure in the middle ear to equalize. This helps relieve the ear pain caused by pressure changes. HOME CARE INSTRUCTIONS   Only take over-the-counter or prescription medicines for pain, discomfort, or fever as directed by your caregiver. DO NOT GIVE CHILDREN ASPIRIN because of the association of Reye's Syndrome in children taking aspirin.  Use a cold pack applied to the outer ear for 15-20 minutes, 03-04 times per day or as needed may reduce pain. Do not apply ice directly to the skin. You may cause frost bite.  Over-the-counter ear drops used as directed may be effective. Your caregiver may sometimes prescribe ear drops.  Resting in an upright position may help reduce pressure in the middle ear and relieve pain.  Ear pain caused by rapidly descending from high altitudes can be relieved by swallowing or chewing gum. Allowing infants to  suck on a bottle during airplane travel can help.  Do not smoke in the house or near children. If you are unable to quit smoking, smoke outside.  Control allergies. SEEK IMMEDIATE MEDICAL CARE IF:   You or your child are becoming sicker.  Pain or fever relief is not obtained with medicine.  You or your child's symptoms (pain, fever, or irritability) do not improve within 24 to 48 hours or as instructed.  Severe pain suddenly stops hurting. This may indicate a ruptured eardrum.  You or your children develop new problems such as severe headaches, stiff neck, difficulty swallowing, or swelling of the face or around the ear. Document Released: 04/23/2004 Document Revised: 11/29/2011 Document Reviewed: 08/28/2008 [facility] Patient Information 2015 [place], [place]. This information is not intended to replace advice given to you by your health care provider. Make sure you discuss any questions you have with your health care provider.

## 2014-04-12 NOTE — Patient Instructions (Addendum)
I suspect your ear pain was radiating pain from the teeth.  Avoid hot/cold foods and chewing on that side for next 4-5 days. hydrocodone if needed, and if pain persists - follow up with dentist next week. Return to the clinic or go to the nearest emergency room if any of your symptoms worsen or new symptoms occur, including if any rash.   If heart rate not returning to normal at home - can return here, your primary provider or emergency room to have this evaluated.   Dental Pain A tooth ache may be caused by cavities (tooth decay). Cavities expose the nerve of the tooth to air and hot or cold temperatures. It may come from an infection or abscess (also called a boil or furuncle) around your tooth. It is also often caused by dental caries (tooth decay). This causes the pain you are having. DIAGNOSIS  Your caregiver can diagnose this problem by exam. TREATMENT   If caused by an infection, it may be treated with medications which kill germs (antibiotics) and pain medications as prescribed by your caregiver. Take medications as directed.  Only take over-the-counter or prescription medicines for pain, discomfort, or fever as directed by your caregiver.  Whether the tooth ache today is caused by infection or dental disease, you should see your dentist as soon as possible for further care. SEEK MEDICAL CARE IF: The exam and treatment you received today has been provided on an emergency basis only. This is not a substitute for complete medical or dental care. If your problem worsens or new problems (symptoms) appear, and you are unable to meet with your dentist, call or return to this location. SEEK IMMEDIATE MEDICAL CARE IF:   You have a fever.  You develop redness and swelling of your face, jaw, or neck.  You are unable to open your mouth.  You have severe pain uncontrolled by pain medicine. MAKE SURE YOU:   Understand these instructions.  Will watch your condition.  Will get help right  away if you are not doing well or get worse. Document Released: 09/06/2005 Document Revised: 11/29/2011 Document Reviewed: 04/24/2008 [facility] Patient Information 2015 [place]. This information is not intended to replace advice given to you by your health care provider. Make sure you discuss any questions you have with your health care provider.   Otalgia The most common reason for this in children is an infection of the middle ear. Pain from the middle ear is usually caused by a build-up of fluid and pressure behind the eardrum. Pain from an earache can be sharp, dull, or burning. The pain may be temporary or constant. The middle ear is connected to the nasal passages by a short narrow tube called the Eustachian tube. The Eustachian tube allows fluid to drain out of the middle ear, and helps keep the pressure in your ear equalized. CAUSES  A cold or allergy can block the Eustachian tube with inflammation and the build-up of secretions. This is especially likely in small children, because their Eustachian tube is shorter and more horizontal. When the Eustachian tube closes, the normal flow of fluid from the middle ear is stopped. Fluid can accumulate and cause stuffiness, pain, hearing loss, and an ear infection if germs start growing in this area. SYMPTOMS  The symptoms of an ear infection may include fever, ear pain, fussiness, increased crying, and irritability. Many children will have temporary and minor hearing loss during and right after an ear infection. Permanent hearing loss is rare,  but the risk increases the more infections a child has. Other causes of ear pain include retained water in the outer ear canal from swimming and bathing. Ear pain in adults is less likely to be from an ear infection. Ear pain may be referred from other locations. Referred pain may be from the joint between your jaw and the skull. It may also come from a tooth problem or problems in the neck. Other causes of  ear pain include:  A foreign body in the ear.  Outer ear infection.  Sinus infections.  Impacted ear wax.  Ear injury.  Arthritis of the jaw or TMJ problems.  Middle ear infection.  Tooth infections.  Sore throat with pain to the ears. DIAGNOSIS  Your caregiver can usually make the diagnosis by examining you. Sometimes other special studies, including x-rays and lab work may be necessary. TREATMENT   If antibiotics were prescribed, use them as directed and finish them even if you or your child's symptoms seem to be improved.  Sometimes PE tubes are needed in children. These are little plastic tubes which are put into the eardrum during a simple surgical procedure. They allow fluid to drain easier and allow the pressure in the middle ear to equalize. This helps relieve the ear pain caused by pressure changes. HOME CARE INSTRUCTIONS   Only take over-the-counter or prescription medicines for pain, discomfort, or fever as directed by your caregiver. DO NOT GIVE CHILDREN ASPIRIN because of the association of Reye's Syndrome in children taking aspirin.  Use a cold pack applied to the outer ear for 15-20 minutes, 03-04 times per day or as needed may reduce pain. Do not apply ice directly to the skin. You may cause frost bite.  Over-the-counter ear drops used as directed may be effective. Your caregiver may sometimes prescribe ear drops.  Resting in an upright position may help reduce pressure in the middle ear and relieve pain.  Ear pain caused by rapidly descending from high altitudes can be relieved by swallowing or chewing gum. Allowing infants to suck on a bottle during airplane travel can help.  Do not smoke in the house or near children. If you are unable to quit smoking, smoke outside.  Control allergies. SEEK IMMEDIATE MEDICAL CARE IF:   You or your child are becoming sicker.  Pain or fever relief is not obtained with medicine.  You or your child's symptoms (pain,  fever, or irritability) do not improve within 24 to 48 hours or as instructed.  Severe pain suddenly stops hurting. This may indicate a ruptured eardrum.  You or your children develop new problems such as severe headaches, stiff neck, difficulty swallowing, or swelling of the face or around the ear. Document Released: 04/23/2004 Document Revised: 11/29/2011 Document Reviewed: 08/28/2008 [facility] Patient Information 2015 [place], [place]. This information is not intended to replace advice given to you by your health care provider. Make sure you discuss any questions you have with your health care provider.

## 2014-04-17 ENCOUNTER — Ambulatory Visit
Admission: RE | Admit: 2014-04-17 | Discharge: 2014-04-17 | Disposition: A | Discharge status: Home / Self Care | Payer: Medicare Other | Source: Ambulatory Visit | Attending: Internal Medicine | Admitting: Internal Medicine

## 2014-04-17 ENCOUNTER — Other Ambulatory Visit: Payer: Self-pay | Admitting: Internal Medicine

## 2014-04-17 DIAGNOSIS — M545 Low back pain: Secondary | ICD-10-CM

## 2014-10-21 DIAGNOSIS — M7551 Bursitis of right shoulder: Secondary | ICD-10-CM | POA: Diagnosis not present

## 2014-10-25 DIAGNOSIS — M19011 Primary osteoarthritis, right shoulder: Secondary | ICD-10-CM | POA: Diagnosis not present

## 2014-10-30 DIAGNOSIS — M7551 Bursitis of right shoulder: Secondary | ICD-10-CM | POA: Diagnosis not present

## 2014-10-30 DIAGNOSIS — M19011 Primary osteoarthritis, right shoulder: Secondary | ICD-10-CM | POA: Diagnosis not present

## 2014-11-27 DIAGNOSIS — G44209 Tension-type headache, unspecified, not intractable: Secondary | ICD-10-CM | POA: Diagnosis not present

## 2014-12-02 DIAGNOSIS — S43011A Anterior subluxation of right humerus, initial encounter: Secondary | ICD-10-CM | POA: Diagnosis not present

## 2014-12-02 DIAGNOSIS — M659 Synovitis and tenosynovitis, unspecified: Secondary | ICD-10-CM | POA: Diagnosis not present

## 2014-12-02 DIAGNOSIS — Y929 Unspecified place or not applicable: Secondary | ICD-10-CM | POA: Diagnosis not present

## 2014-12-02 DIAGNOSIS — S46011A Strain of muscle(s) and tendon(s) of the rotator cuff of right shoulder, initial encounter: Secondary | ICD-10-CM | POA: Diagnosis not present

## 2014-12-02 DIAGNOSIS — M24111 Other articular cartilage disorders, right shoulder: Secondary | ICD-10-CM | POA: Diagnosis not present

## 2014-12-02 DIAGNOSIS — G8918 Other acute postprocedural pain: Secondary | ICD-10-CM | POA: Diagnosis not present

## 2014-12-02 DIAGNOSIS — M75121 Complete rotator cuff tear or rupture of right shoulder, not specified as traumatic: Secondary | ICD-10-CM | POA: Diagnosis not present

## 2014-12-02 DIAGNOSIS — M19011 Primary osteoarthritis, right shoulder: Secondary | ICD-10-CM | POA: Diagnosis not present

## 2014-12-02 DIAGNOSIS — X58XXXA Exposure to other specified factors, initial encounter: Secondary | ICD-10-CM | POA: Diagnosis not present

## 2014-12-02 DIAGNOSIS — M7541 Impingement syndrome of right shoulder: Secondary | ICD-10-CM | POA: Diagnosis not present

## 2014-12-05 DIAGNOSIS — M6281 Muscle weakness (generalized): Secondary | ICD-10-CM | POA: Diagnosis not present

## 2014-12-05 DIAGNOSIS — M25511 Pain in right shoulder: Secondary | ICD-10-CM | POA: Diagnosis not present

## 2014-12-05 DIAGNOSIS — M75112 Incomplete rotator cuff tear or rupture of left shoulder, not specified as traumatic: Secondary | ICD-10-CM | POA: Diagnosis not present

## 2014-12-05 DIAGNOSIS — M25611 Stiffness of right shoulder, not elsewhere classified: Secondary | ICD-10-CM | POA: Diagnosis not present

## 2014-12-09 DIAGNOSIS — M6281 Muscle weakness (generalized): Secondary | ICD-10-CM | POA: Diagnosis not present

## 2014-12-09 DIAGNOSIS — M25611 Stiffness of right shoulder, not elsewhere classified: Secondary | ICD-10-CM | POA: Diagnosis not present

## 2014-12-09 DIAGNOSIS — M25511 Pain in right shoulder: Secondary | ICD-10-CM | POA: Diagnosis not present

## 2014-12-11 DIAGNOSIS — M75121 Complete rotator cuff tear or rupture of right shoulder, not specified as traumatic: Secondary | ICD-10-CM | POA: Diagnosis not present

## 2014-12-12 DIAGNOSIS — M25511 Pain in right shoulder: Secondary | ICD-10-CM | POA: Diagnosis not present

## 2014-12-12 DIAGNOSIS — M6281 Muscle weakness (generalized): Secondary | ICD-10-CM | POA: Diagnosis not present

## 2014-12-12 DIAGNOSIS — M25611 Stiffness of right shoulder, not elsewhere classified: Secondary | ICD-10-CM | POA: Diagnosis not present

## 2014-12-12 DIAGNOSIS — M75112 Incomplete rotator cuff tear or rupture of left shoulder, not specified as traumatic: Secondary | ICD-10-CM | POA: Diagnosis not present

## 2014-12-16 DIAGNOSIS — M25611 Stiffness of right shoulder, not elsewhere classified: Secondary | ICD-10-CM | POA: Diagnosis not present

## 2014-12-16 DIAGNOSIS — M6281 Muscle weakness (generalized): Secondary | ICD-10-CM | POA: Diagnosis not present

## 2014-12-16 DIAGNOSIS — M25511 Pain in right shoulder: Secondary | ICD-10-CM | POA: Diagnosis not present

## 2014-12-16 DIAGNOSIS — M75112 Incomplete rotator cuff tear or rupture of left shoulder, not specified as traumatic: Secondary | ICD-10-CM | POA: Diagnosis not present

## 2014-12-19 ENCOUNTER — Encounter (HOSPITAL_COMMUNITY): Payer: Self-pay

## 2014-12-19 ENCOUNTER — Emergency Department (HOSPITAL_COMMUNITY)
Admission: EM | Admit: 2014-12-19 | Discharge: 2014-12-19 | Disposition: A | Discharge status: Home / Self Care | Payer: Medicare Other | Attending: Emergency Medicine | Admitting: Emergency Medicine

## 2014-12-19 DIAGNOSIS — Z7982 Long term (current) use of aspirin: Secondary | ICD-10-CM | POA: Insufficient documentation

## 2014-12-19 DIAGNOSIS — F419 Anxiety disorder, unspecified: Secondary | ICD-10-CM

## 2014-12-19 DIAGNOSIS — E0590 Thyrotoxicosis, unspecified without thyrotoxic crisis or storm: Secondary | ICD-10-CM | POA: Insufficient documentation

## 2014-12-19 DIAGNOSIS — M25511 Pain in right shoulder: Secondary | ICD-10-CM

## 2014-12-19 DIAGNOSIS — Z880 Allergy status to penicillin: Secondary | ICD-10-CM | POA: Diagnosis not present

## 2014-12-19 DIAGNOSIS — M791 Myalgia: Secondary | ICD-10-CM | POA: Insufficient documentation

## 2014-12-19 DIAGNOSIS — Z8679 Personal history of other diseases of the circulatory system: Secondary | ICD-10-CM | POA: Diagnosis not present

## 2014-12-19 DIAGNOSIS — Z9889 Other specified postprocedural states: Secondary | ICD-10-CM | POA: Insufficient documentation

## 2014-12-19 DIAGNOSIS — M25611 Stiffness of right shoulder, not elsewhere classified: Secondary | ICD-10-CM | POA: Diagnosis not present

## 2014-12-19 DIAGNOSIS — M75112 Incomplete rotator cuff tear or rupture of left shoulder, not specified as traumatic: Secondary | ICD-10-CM | POA: Diagnosis not present

## 2014-12-19 DIAGNOSIS — Z9851 Tubal ligation status: Secondary | ICD-10-CM | POA: Insufficient documentation

## 2014-12-19 DIAGNOSIS — R05 Cough: Secondary | ICD-10-CM | POA: Diagnosis present

## 2014-12-19 DIAGNOSIS — Z79899 Other long term (current) drug therapy: Secondary | ICD-10-CM | POA: Diagnosis not present

## 2014-12-19 DIAGNOSIS — R9431 Abnormal electrocardiogram [ECG] [EKG]: Secondary | ICD-10-CM | POA: Diagnosis not present

## 2014-12-19 DIAGNOSIS — M6281 Muscle weakness (generalized): Secondary | ICD-10-CM | POA: Diagnosis not present

## 2014-12-19 MED ORDER — LORAZEPAM 1 MG PO TABS
1.0000 mg | ORAL_TABLET | Freq: Once | ORAL | Status: AC
Start: 2014-12-19 — End: 2014-12-19
  Administered 2014-12-19: 1 mg via ORAL
  Filled 2014-12-19: qty 1

## 2014-12-19 NOTE — ED Notes (Signed)
Patient ambulatory to and from restroom with spouse

## 2014-12-19 NOTE — ED Notes (Addendum)
Patient states she took pain medication Monday and Tuesday night and had nightmares. Patient states she has not taken the pain medication since Tuesday morning. Today patient is very anxious, breathing heavy, and complaining of mild nausea with hot & cold flashes. Patient is A&OX4 at this time.

## 2014-12-19 NOTE — Discharge Instructions (Signed)
Please read and follow all provided instructions.  Your diagnoses today include:  1. Anxiety   2. Right shoulder pain     Tests performed today include:  EKG - no problems  Vital signs. See below for your results today.   Medications prescribed:   None  Take any prescribed medications only as directed.  Home care instructions:  Follow any educational materials contained in this packet.  Use home ativan as directed if you feel anxious. This may also help relax your shoulder and help with pain.   Follow-up instructions: Please follow-up with your primary care provider in the next 3 days for further evaluation of your symptoms.   Return instructions:   Please return to the Emergency Department if you experience worsening symptoms.   Return with chest pain or shortness of breath.  Please return if you have any other emergent concerns.  Additional Information:  Your vital signs today were: BP 139/77 mmHg   Pulse 72   Temp(Src) 98 F (36.7 C)   Resp 19   SpO2 99% If your blood pressure (BP) was elevated above 135/85 this visit, please have this repeated by your doctor within one month. --------------

## 2014-12-19 NOTE — ED Provider Notes (Signed)
CSN: [id number]     Arrival date & time 12/19/14  0555 History   First MD Initiated Contact with Patient 12/19/14 [phone]     Chief Complaint  Patient presents with  . Allergic Reaction     (Consider location/radiation/quality/duration/timing/severity/associated sxs/prior Treatment) HPI Comments: Patient presents with complaint of anxiety, breathing fast, nightmares, hot and cold flashes. Patient states that she had rotator cuff surgery on 12/02/14. Patient had a typical postop course until she was prescribed oxycodone 3 days ago. She was not previously on this medication. Patient had been prescribed Vicodin but developed a rash. She was switched to tramadol which did not work. Patient states that after taking oxycodone on Monday night (today is Thursday morning), she had bad nightmares. She then took the medication again on Tuesday night and again had nightmares. Since that time she has had "hot and cold flashes" but no documented fever. Early this morning her right shoulder was hurting and she did not want to take any pain medication so she was up icing the shoulder. Her breathing became worse and she presents to the emergency department for evaluation. She denies chest pain or shortness of breath. She has had ongoing cough but denies hemoptysis. No swelling of her upper or lower extremities. No history of blood clots. No hormone use. Patient is prescribed Ativan. She has not taken any Ativan since last week. The onset of this condition was acute. The course is constant. Aggravating factors: none. Alleviating factors: none.    The history is provided by the patient.    Past Medical History  Diagnosis Date  . MIGRAINE HEADACHE   . HYPERTHYROIDISM   . LYMPHADENOPATHY   . Dysphagia    Past Surgical History  Procedure Laterality Date  . Tubal ligation    . Mohs surgery  2001   Family History  Problem Relation Age of Onset  . Heart disease Mother   . Stroke Father   . Arthritis Sister   .  Diabetes Brother   . Arthritis Brother   . Diabetes Brother    History  Substance Use Topics  . Smoking status: Former Games developer  . Smokeless tobacco: Not on file  . Alcohol Use: No   OB History    No data available     Review of Systems  Constitutional: Negative for fever.  HENT: Negative for rhinorrhea and sore throat.   Eyes: Negative for redness.  Respiratory: Positive for cough. Negative for shortness of breath.   Cardiovascular: Negative for chest pain and leg swelling.  Gastrointestinal: Negative for nausea, vomiting, abdominal pain and diarrhea.  Genitourinary: Negative for dysuria.  Musculoskeletal: Positive for myalgias and arthralgias.  Skin: Negative for rash.  Neurological: Negative for headaches.  Psychiatric/Behavioral: Positive for sleep disturbance. The patient is nervous/anxious.       Allergies  Penicillins; Quinolones; and Statins  Home Medications   Prior to Admission medications   Medication Sig Start Date End Date Taking? Authorizing Provider  ASPIRIN PO Take by mouth daily.    Historical Provider, MD  butalbital-aspirin-caffeine [facility]) [phone] MG per capsule Take up to 4 tablet daily as needed 05/28/13   Historical Provider, MD  cholecalciferol (VITAMIN D) 1000 UNITS tablet Take 1,000 Units by mouth daily.    Historical Provider, MD  CYCLOBENZAPRINE HCL PO Take 10 mg by mouth 2 (two) times daily as needed.    Historical Provider, MD  folic acid (FOLVITE) 400 MCG tablet Take 400 mcg by mouth daily.    Historical Provider,  MD  HYDROcodone-acetaminophen (NORCO/VICODIN) 5-325 MG per tablet Take 1 tablet by mouth every 6 (six) hours as needed for moderate pain. 04/12/14   Shade FloodJeffrey R Greene, MD  levothyroxine (SYNTHROID, LEVOTHROID) 100 MCG tablet Take one tablet daily 06/12/13   Historical Provider, MD  LORazepam (ATIVAN) 1 MG tablet Take 1 mg by mouth 3 (three) times daily as needed for anxiety.    Historical Provider, MD  Multiple Vitamins-Minerals  (MULTIVITAMIN WITH MINERALS) tablet Take 1 tablet by mouth daily.    Historical Provider, MD  ranitidine (ZANTAC) 150 MG capsule Take 300 mg by mouth 2 (two) times daily.    Historical Provider, MD  vitamin C (ASCORBIC ACID) 500 MG tablet Take 500 mg by mouth daily.    Historical Provider, MD  vitamin E 400 UNIT capsule Take 400 Units by mouth daily.    Historical Provider, MD   BP 180/80 mmHg  Pulse 104  Temp(Src) 98 F (36.7 C)  Resp 28  SpO2 100% Physical Exam  Constitutional: She appears well-developed and well-nourished. She appears distressed.  Pt tearful  HENT:  Head: Normocephalic and atraumatic.  Eyes: Conjunctivae are normal. Right eye exhibits no discharge. Left eye exhibits no discharge.  Neck: Normal range of motion. Neck supple.  Cardiovascular: Normal rate, regular rhythm and normal heart sounds.   Pulses:      Radial pulses are 2+ on the right side, and 2+ on the left side.  Pulmonary/Chest: Effort normal and breath sounds normal. Tachypnea noted. No respiratory distress. She has no wheezes. She has no rales.  Abdominal: Soft. There is no tenderness. There is no rebound and no guarding.  Musculoskeletal: She exhibits tenderness. She exhibits no edema.  No upper or lower extremity swelling or redness, particularly of the R upper extremity. Mild R shoulder pain. Wound site appears well.    Neurological: She is alert.  Skin: Skin is warm and dry.  Psychiatric: Her mood appears anxious.  Nursing note and vitals reviewed.   ED Course  Procedures (including critical care time) Labs Review Labs Reviewed - No data to display  Imaging Review No results found.   EKG Interpretation   Date/Time:  Thursday December 19 2014 07:50:36 EDT Ventricular Rate:  70 PR Interval:  168 QRS Duration: 81 QT Interval:  415 QTC Calculation: 448 R Axis:   -12 Text Interpretation:  Sinus rhythm Low voltage, precordial leads No  significant change was found Confirmed by Manus GunningANCOUR  MD,  STEPHEN ([id number]) on  12/19/2014 8:04:04 AM      6:29 AM Patient seen and examined. Work-up initiated. Medications ordered. Patient is extremely anxious. She is tachypneic, however denies SOB and chest pain. No cough, hemoptysis. Suspect this is a panic attack, however will need reassessment after anxiolytics.   Vital signs reviewed and are as follows: BP 180/80 mmHg  Pulse 104  Temp(Src) 98 F (36.7 C)  Resp 28  SpO2 100%  7:11 AM Patient rechecked. She is starting to feel better with ativan. She has been up to the restroom. Will monitor.   7:40 AM Discussed with Dr. Manus Gunningancour who will see.   7:52 AM Discussed with Dr. Manus Gunningancour. Agree no further lab work-up necessary. Doubt PE.   ED ECG REPORT   Date: 12/19/2014  Rate: 70  Rhythm: normal sinus rhythm  QRS Axis: normal  Intervals: normal  ST/T Wave abnormalities: normal  Conduction Disutrbances:none  Narrative Interpretation:   Old EKG Reviewed: changes noted from 2014, ectopy resolved  I have personally reviewed  the EKG tracing and agree with the computerized printout as noted.  8:46 AM Patient feels much improved. I encouraged patient to continue her lorazepam as prescribed if she feels anxious. She will keep her appointment with her orthopedist tomorrow for recheck. Patient instructed to return to the emergency department with worsening symptoms, chest pain, shortness of breath. Patient states that she gets with Vicodin is very mild. She would rather take the Vicodin and 'deal with the rash' than continue oxycodone. D/c to home.   MDM   Final diagnoses:  Right shoulder pain  Anxiety   Patient s/p surgery R shoulder presents with panic attack, improved in the ED with PO ativan. She does not have chest pain or shortness of breath. No signs of upper or lower extremity DVT. EKG nml. Very low suspicion for PE. Vitals improved with treatment of anxiety. Patient has appropriate mediations at home. She will use lorazepam for anxiety  as needed. Return instructions discussed with patient.   Renne Crigler, PA-C 12/19/14 1610  Glynn Octave, MD 12/19/14 [phone]

## 2014-12-19 NOTE — ED Notes (Signed)
PA at bedside.

## 2014-12-19 NOTE — ED Notes (Signed)
Pt given discharge instructions and all questions answered. Pt will be departing with husband via car.

## 2014-12-19 NOTE — ED Notes (Signed)
Pt had rotator cuff surgery on the 14th, she states that she took norco and tramadol with no relief, Monday they gave her oxycodone and states that she's been hyperventilating since then, she only took the oxycodone through Tuesday night. Pt states that she's hot ans cold and had a generalized rash at one time

## 2014-12-19 NOTE — ED Notes (Signed)
Unable to obtain temperature in triage.

## 2014-12-20 DIAGNOSIS — Z4789 Encounter for other orthopedic aftercare: Secondary | ICD-10-CM | POA: Diagnosis not present

## 2014-12-23 ENCOUNTER — Ambulatory Visit
Admission: RE | Admit: 2014-12-23 | Discharge: 2014-12-23 | Disposition: A | Discharge status: Home / Self Care | Payer: Medicare Other | Source: Ambulatory Visit | Attending: Internal Medicine | Admitting: Internal Medicine

## 2014-12-23 ENCOUNTER — Other Ambulatory Visit: Payer: Self-pay | Admitting: Internal Medicine

## 2014-12-23 DIAGNOSIS — I10 Essential (primary) hypertension: Secondary | ICD-10-CM | POA: Diagnosis not present

## 2014-12-23 DIAGNOSIS — R05 Cough: Secondary | ICD-10-CM

## 2014-12-23 DIAGNOSIS — R059 Cough, unspecified: Secondary | ICD-10-CM

## 2014-12-24 DIAGNOSIS — M6281 Muscle weakness (generalized): Secondary | ICD-10-CM | POA: Diagnosis not present

## 2014-12-24 DIAGNOSIS — M75112 Incomplete rotator cuff tear or rupture of left shoulder, not specified as traumatic: Secondary | ICD-10-CM | POA: Diagnosis not present

## 2014-12-24 DIAGNOSIS — M25511 Pain in right shoulder: Secondary | ICD-10-CM | POA: Diagnosis not present

## 2014-12-24 DIAGNOSIS — M25611 Stiffness of right shoulder, not elsewhere classified: Secondary | ICD-10-CM | POA: Diagnosis not present

## 2014-12-27 DIAGNOSIS — M25611 Stiffness of right shoulder, not elsewhere classified: Secondary | ICD-10-CM | POA: Diagnosis not present

## 2014-12-27 DIAGNOSIS — M25511 Pain in right shoulder: Secondary | ICD-10-CM | POA: Diagnosis not present

## 2014-12-27 DIAGNOSIS — M6281 Muscle weakness (generalized): Secondary | ICD-10-CM | POA: Diagnosis not present

## 2014-12-27 DIAGNOSIS — Z96641 Presence of right artificial hip joint: Secondary | ICD-10-CM | POA: Diagnosis not present

## 2014-12-30 DIAGNOSIS — M25611 Stiffness of right shoulder, not elsewhere classified: Secondary | ICD-10-CM | POA: Diagnosis not present

## 2014-12-30 DIAGNOSIS — M75112 Incomplete rotator cuff tear or rupture of left shoulder, not specified as traumatic: Secondary | ICD-10-CM | POA: Diagnosis not present

## 2014-12-30 DIAGNOSIS — M6281 Muscle weakness (generalized): Secondary | ICD-10-CM | POA: Diagnosis not present

## 2014-12-30 DIAGNOSIS — M25511 Pain in right shoulder: Secondary | ICD-10-CM | POA: Diagnosis not present

## 2015-01-03 DIAGNOSIS — M75112 Incomplete rotator cuff tear or rupture of left shoulder, not specified as traumatic: Secondary | ICD-10-CM | POA: Diagnosis not present

## 2015-01-03 DIAGNOSIS — M25611 Stiffness of right shoulder, not elsewhere classified: Secondary | ICD-10-CM | POA: Diagnosis not present

## 2015-01-03 DIAGNOSIS — M25511 Pain in right shoulder: Secondary | ICD-10-CM | POA: Diagnosis not present

## 2015-01-03 DIAGNOSIS — M6281 Muscle weakness (generalized): Secondary | ICD-10-CM | POA: Diagnosis not present

## 2015-01-06 DIAGNOSIS — R42 Dizziness and giddiness: Secondary | ICD-10-CM | POA: Diagnosis not present

## 2015-01-07 DIAGNOSIS — M25511 Pain in right shoulder: Secondary | ICD-10-CM | POA: Diagnosis not present

## 2015-01-07 DIAGNOSIS — M75112 Incomplete rotator cuff tear or rupture of left shoulder, not specified as traumatic: Secondary | ICD-10-CM | POA: Diagnosis not present

## 2015-01-07 DIAGNOSIS — M25611 Stiffness of right shoulder, not elsewhere classified: Secondary | ICD-10-CM | POA: Diagnosis not present

## 2015-01-07 DIAGNOSIS — M6281 Muscle weakness (generalized): Secondary | ICD-10-CM | POA: Diagnosis not present

## 2015-01-08 DIAGNOSIS — Z4789 Encounter for other orthopedic aftercare: Secondary | ICD-10-CM | POA: Diagnosis not present

## 2015-01-10 DIAGNOSIS — M6281 Muscle weakness (generalized): Secondary | ICD-10-CM | POA: Diagnosis not present

## 2015-01-10 DIAGNOSIS — M75112 Incomplete rotator cuff tear or rupture of left shoulder, not specified as traumatic: Secondary | ICD-10-CM | POA: Diagnosis not present

## 2015-01-10 DIAGNOSIS — M25511 Pain in right shoulder: Secondary | ICD-10-CM | POA: Diagnosis not present

## 2015-01-10 DIAGNOSIS — M25611 Stiffness of right shoulder, not elsewhere classified: Secondary | ICD-10-CM | POA: Diagnosis not present

## 2015-01-14 DIAGNOSIS — M25611 Stiffness of right shoulder, not elsewhere classified: Secondary | ICD-10-CM | POA: Diagnosis not present

## 2015-01-14 DIAGNOSIS — M6281 Muscle weakness (generalized): Secondary | ICD-10-CM | POA: Diagnosis not present

## 2015-01-14 DIAGNOSIS — M75112 Incomplete rotator cuff tear or rupture of left shoulder, not specified as traumatic: Secondary | ICD-10-CM | POA: Diagnosis not present

## 2015-01-14 DIAGNOSIS — M25511 Pain in right shoulder: Secondary | ICD-10-CM | POA: Diagnosis not present

## 2015-01-21 DIAGNOSIS — M25611 Stiffness of right shoulder, not elsewhere classified: Secondary | ICD-10-CM | POA: Diagnosis not present

## 2015-01-21 DIAGNOSIS — M25511 Pain in right shoulder: Secondary | ICD-10-CM | POA: Diagnosis not present

## 2015-01-21 DIAGNOSIS — M6281 Muscle weakness (generalized): Secondary | ICD-10-CM | POA: Diagnosis not present

## 2015-01-21 DIAGNOSIS — M75112 Incomplete rotator cuff tear or rupture of left shoulder, not specified as traumatic: Secondary | ICD-10-CM | POA: Diagnosis not present

## 2015-03-11 ENCOUNTER — Other Ambulatory Visit: Payer: Self-pay

## 2015-03-13 LAB — CYTOLOGY - PAP

## 2016-03-30 ENCOUNTER — Other Ambulatory Visit: Payer: Self-pay | Admitting: Gastroenterology

## 2016-03-31 ENCOUNTER — Other Ambulatory Visit: Payer: Self-pay | Admitting: Gastroenterology

## 2016-03-31 DIAGNOSIS — Q400 Congenital hypertrophic pyloric stenosis: Secondary | ICD-10-CM

## 2016-04-02 ENCOUNTER — Ambulatory Visit
Admission: RE | Admit: 2016-04-02 | Discharge: 2016-04-02 | Disposition: A | Discharge status: Home / Self Care | Payer: Medicare Other | Source: Ambulatory Visit | Attending: Gastroenterology | Admitting: Gastroenterology

## 2016-04-02 DIAGNOSIS — Q400 Congenital hypertrophic pyloric stenosis: Secondary | ICD-10-CM

## 2016-12-28 ENCOUNTER — Other Ambulatory Visit: Payer: Self-pay | Admitting: Internal Medicine

## 2016-12-28 ENCOUNTER — Ambulatory Visit
Admission: RE | Admit: 2016-12-28 | Discharge: 2016-12-28 | Disposition: A | Discharge status: Home / Self Care | Payer: Medicare Other | Source: Ambulatory Visit | Attending: Internal Medicine | Admitting: Internal Medicine

## 2016-12-28 DIAGNOSIS — R059 Cough, unspecified: Secondary | ICD-10-CM

## 2016-12-28 DIAGNOSIS — R05 Cough: Secondary | ICD-10-CM

## 2017-11-25 ENCOUNTER — Ambulatory Visit: Payer: Self-pay | Admitting: *Deleted

## 2017-11-25 NOTE — Telephone Encounter (Signed)
Pt calling and tearful with complaints of a sudden headache and pain to both sides of jaws. Pt also has complaints of chest pain and states "I don't know what's going on, I just don't feel right". Pt advised to call 911 to seek treatment in the ED. Pt verbalized understanding.  Pt states she is home alone at this time and she was going to call her husband as well, who was 10 min away.  Reason for Disposition . Pain also present in shoulder(s) or arm(s) or jaw  (Exception: pain is clearly made worse by movement)  Protocols used: CHEST PAIN-A-AH

## 2019-01-17 ENCOUNTER — Telehealth: Payer: Self-pay | Admitting: *Deleted

## 2019-01-17 NOTE — Telephone Encounter (Signed)
[id number]/tct-[name]/ she and husband are doing fine, [name] is still having back issues but both are okay.  Staying in aND and wearing face mask when out and about. Rhonda Davis,BSN,RN3,CCM,CN

## 2019-01-22 ENCOUNTER — Encounter: Payer: Self-pay | Admitting: *Deleted

## 2019-01-22 NOTE — Congregational Nurse Program (Signed)
[id number] entry for [id number]/tcf-Maryuri wanting to know about area md for shoulder pain, has seen her primary care had steroid injection they are not helping.  Pain has continued to go down arm and into the back.  Suggested that she ask friend and relative for suggestion to see who she want to see.  Gave her three names of orthopedic surgeons in the area.  Caller was pleased with the information.  Suggested she call back if she has any trouble getting ito to see the md of her choice and I might be able to assist.  Bjorn Loser Davis,BSN,RN3,CCM,CN

## 2019-01-24 ENCOUNTER — Telehealth: Payer: Self-pay | Admitting: *Deleted

## 2019-01-24 NOTE — Telephone Encounter (Signed)
[id number]/tcf-Dolly/did follow my advise and will be seeing Dr. At [facility] on April 11,2020.  Was very pleased with the choice of md and will call back with resoults of the visit./ ,BSN,RN3,CCM,CN

## 2019-01-30 DIAGNOSIS — K219 Gastro-esophageal reflux disease without esophagitis: Secondary | ICD-10-CM | POA: Insufficient documentation

## 2019-01-30 DIAGNOSIS — F418 Other specified anxiety disorders: Secondary | ICD-10-CM | POA: Insufficient documentation

## 2019-01-31 ENCOUNTER — Other Ambulatory Visit: Payer: Self-pay | Admitting: Obstetrics & Gynecology

## 2019-01-31 DIAGNOSIS — N644 Mastodynia: Secondary | ICD-10-CM

## 2019-02-06 ENCOUNTER — Ambulatory Visit: Payer: Medicare Other

## 2019-02-06 ENCOUNTER — Other Ambulatory Visit: Payer: Self-pay

## 2019-02-06 ENCOUNTER — Ambulatory Visit
Admission: RE | Admit: 2019-02-06 | Discharge: 2019-02-06 | Disposition: A | Discharge status: Home / Self Care | Payer: Medicare Other | Source: Ambulatory Visit | Attending: Obstetrics & Gynecology | Admitting: Obstetrics & Gynecology

## 2019-02-06 DIAGNOSIS — N644 Mastodynia: Secondary | ICD-10-CM

## 2019-02-21 ENCOUNTER — Other Ambulatory Visit: Payer: Medicare Other

## 2019-03-06 NOTE — H&P (Signed)
Patient's anticipated LOS is less than 2 midnights, meeting these requirements: - Younger than 50 - Lives within 1 hour of care - Has a competent adult at home to recover with post-op recover - NO history of  - Chronic pain requiring opiods  - Diabetes  - Coronary Artery Disease  - Heart failure  - Heart attack  - Stroke  - DVT/VTE  - Cardiac arrhythmia  - Respiratory Failure/COPD  - Renal failure  - Anemia  - Advanced Liver disease       [name] is an 73 y.o. female.    Chief Complaint: right shoulder pain  HPI: Pt is a 73 y.o. female complaining of right shoulder pain for multiple years. Pain had continually increased since the beginning. X-rays in the clinic show end-stage arthritic changes of the right shoulder. Pt has tried various conservative treatments which have failed to alleviate their symptoms, including injections and therapy. Various options are discussed with the patient. Risks, benefits and expectations were discussed with the patient. Patient understand the risks, benefits and expectations and wishes to proceed with surgery.   PCP:  Lorene Dy, MD  D/C Plans: Home  PMH: Past Medical History:  Diagnosis Date  . Dysphagia   . HYPERTHYROIDISM   . LYMPHADENOPATHY   . MIGRAINE HEADACHE     PSH: Past Surgical History:  Procedure Laterality Date  . [place]  2001  . TUBAL LIGATION      Social History:  reports that she has quit smoking. She does not have any smokeless tobacco history on file. She reports that she does not drink alcohol or use drugs.  Allergies:  Allergies  Allergen Reactions  . Penicillins     REACTION: rash  . Statins     REACTION: leg pain, muscle cramps  . Quinolones Rash    Medications: No current facility-administered medications for this encounter.    Current Outpatient Medications  Medication Sig Dispense Refill  . ASPIRIN PO Take 325 mg by mouth daily.     . butalbital-aspirin-caffeine (FIORINAL)  50-325-40 MG per capsule Take up to 4 tablet daily as needed    . calcium carbonate (OS-CAL) 1250 (500 CA) MG chewable tablet Chew 1 tablet by mouth daily.    . cholecalciferol (VITAMIN D) 1000 UNITS tablet Take 1,000 Units by mouth daily.    . folic acid (FOLVITE) 626 MCG tablet Take 400 mcg by mouth daily.    Marland Kitchen HYDROcodone-acetaminophen (NORCO/VICODIN) 5-325 MG per tablet Take 1 tablet by mouth every 6 (six) hours as needed for moderate pain. 15 tablet 0  . levothyroxine (SYNTHROID, LEVOTHROID) 100 MCG tablet Take one tablet daily    . loratadine (CLARITIN) 10 MG tablet Take 10 mg by mouth daily.    Marland Kitchen LORazepam (ATIVAN) 1 MG tablet Take 1 mg by mouth 3 (three) times daily as needed for anxiety.    . Multiple Vitamins-Minerals (MULTIVITAMIN WITH MINERALS) tablet Take 1 tablet by mouth daily.    Marland Kitchen oxyCODONE-acetaminophen (PERCOCET/ROXICET) 5-325 MG per tablet Take 1 tablet by mouth 2 (two) times daily.     . ranitidine (ZANTAC) 150 MG capsule Take 300 mg by mouth 2 (two) times daily.    . vitamin C (ASCORBIC ACID) 500 MG tablet Take 500 mg by mouth daily.    . vitamin E 400 UNIT capsule Take 400 Units by mouth daily.      No results found for this or any previous visit (from the past 48 hour(s)). No results found.  ROS: Pain with rom of the right upper extremity  Physical Exam: Alert and oriented 73 y.o. female in no acute distress Cranial nerves 2-12 intact Cervical spine: full rom with no tenderness, nv intact distally Chest: active breath sounds bilaterally, no wheeze rhonchi or rales Heart: regular rate and rhythm, no murmur Abd: non tender non distended with active bowel sounds Hip is stable with rom  Right shoulder with limited rom due to pain and guarding nv intact distally No rashes or edema  Assessment/Plan Assessment: right shoulder cuff arthropathy  Plan:  Patient will undergo a right reverse total shoulder by [facility]. Risks benefits and  expectations were discussed with the patient. Patient understand risks, benefits and expectations and wishes to proceed. Preoperative templating of the joint replacement has been completed, documented, and submitted to the Operating Room personnel in order to optimize intra-operative equipment management.   Alphonsa OverallBrad  PA-C, MPAS [facility] now [facility]  [place] [address], [place], [place] [address] Phone: [phone] [email or web address] Facebook  [employer]  LinkedIn  Twitter

## 2019-03-08 NOTE — Patient Instructions (Addendum)
Lona Kettleollie M Ivan    Your procedure is scheduled on: 03-16-2019   [facility]    Report to SHORT STAY at 630 AM   YOU NEED TO HAVE A COVID 19 TEST ON 03-12-19  @ 1:00 PM, THIS TEST MUST BE DONE BEFORE SURGERY, [facility]. ONCE YOUR COVID TEST IS COMPLETED, PLEASE BEGIN THE QUARANTINE INSTRUCTIONS AS OUTLINED IN YOUR HANDOUT.   Call this number if you have problems the morning of surgery [phone]    Remember: NO SOLID FOOD AFTER MIDNIGHT THE NIGHT PRIOR TO SURGERY. NOTHING BY MOUTH EXCEPT CLEAR LIQUIDS UNTIL 430 AM. PLEASE FINISH ENSURE DRINK PER SURGEON ORDER 3 HOURS PRIOR TO SCHEDULED SURGERY TIME WHICH NEEDS TO BE COMPLETED AT 430 AM.   CLEAR LIQUID DIET   Foods Allowed                                                                     Foods Excluded  Coffee and tea, regular and decaf                             liquids that you cannot  Plain Jell-O in any flavor                                             see through such as: Fruit ices (not with fruit pulp)                                     milk, soups, orange juice  Iced Popsicles                                    All solid food Carbonated beverages, regular and diet                                    Cranberry, grape and apple juices Sports drinks like Gatorade Lightly seasoned clear broth or consume(fat free) Sugar, honey syrup  Sample Menu Breakfast                                Lunch                                     Supper Cranberry juice                    Beef broth                            Chicken broth Jell-O  Grape juice                           Apple juice Coffee or tea                        Jell-O                                      Popsicle                                                Coffee or tea                        Coffee or  tea  _____________________________________________________________________     Take these medicines the morning of surgery with A SIP OF WATER: LORAZEPAM (ATIVAN ) IF NEEDED, SERTRALINE (ZOLOFT), LEVOTHYROXINE (SYNTHROID), OMEPRAZOLE (PRILOSEC)            BRUSH YOUR TEETH MORNING OF SURGERY AND RINSE YOUR MOUTH OUT, NO CHEWING GUM CANDY OR MINTS.                      You may not have any metal on your body including hair pins and              piercings    Do not wear jewelry, make-up, lotions, powders or perfumes, deodorant             Do not wear nail polish.  Do not shave  48 hours prior to surgery.            .   Do not bring valuables to the hospital. [place].  Contacts, dentures or bridgework may not be worn into surgery.      _____________________________________________________________________             [facility] - Preparing for Surgery Before surgery, you can play an important role.  Because skin is not sterile, your skin needs to be as free of germs as possible.  You can reduce the number of germs on your skin by washing with CHG (chlorahexidine gluconate) soap before surgery.  CHG is an antiseptic cleaner which kills germs and bonds with the skin to continue killing germs even after washing. Please DO NOT use if you have an allergy to CHG or antibacterial soaps.  If your skin becomes reddened/irritated stop using the CHG and inform your nurse when you arrive at Short Stay. Do not shave (including legs and underarms) for at least 48 hours prior to the first CHG shower.  You may shave your face/neck. Please follow these instructions carefully:  1.  Shower with CHG Soap the night before surgery and the  morning of Surgery.  2.  If you choose to wash your hair, wash your hair first as usual with your  normal  shampoo.  3.  After you shampoo, rinse your hair and body thoroughly to remove the  shampoo.  4.  Use CHG as you would any other liquid soap.  You can apply chg directly  to the skin and wash                       Gently with a scrungie or clean washcloth.  5.  Apply the CHG Soap to your body ONLY FROM THE NECK DOWN.   Do not use on face/ open                           Wound or open sores. Avoid contact with eyes, ears mouth and genitals (private parts).                       Wash face,  Genitals (private parts) with your normal soap.             6.  Wash thoroughly, paying special attention to the area where your surgery  will be performed.  7.  Thoroughly rinse your body with warm water from the neck down.  8.  DO NOT shower/wash with your normal soap after using and rinsing off  the CHG Soap.                9.  Pat yourself dry with a clean towel.            10.  Wear clean pajamas.            11.  Place clean sheets on your bed the night of your first shower and do not  sleep with pets. Day of Surgery : Do not apply any lotions/deodorants the morning of surgery.  Please wear clean clothes to the hospital/surgery center.  FAILURE TO FOLLOW THESE INSTRUCTIONS MAY RESULT IN THE CANCELLATION OF YOUR SURGERY PATIENT SIGNATURE_________________________________  NURSE SIGNATURE__________________________________  ________________________________________________________________________   Adam Phenix  An incentive spirometer is a tool that can help keep your lungs clear and active. This tool measures how well you are filling your lungs with each breath. Taking long deep breaths may help reverse or decrease the chance of developing breathing (pulmonary) problems (especially infection) following:  A long period of time when you are unable to move or be active. BEFORE THE PROCEDURE   If the spirometer includes an indicator to show your best effort, your nurse or respiratory therapist will set it to a desired goal.  If possible, sit up straight or lean slightly forward. Try  not to slouch.  Hold the incentive spirometer in an upright position. INSTRUCTIONS FOR USE  1. Sit on the edge of your bed if possible, or sit up as far as you can in bed or on a chair. 2. Hold the incentive spirometer in an upright position. 3. Breathe out normally. 4. Place the mouthpiece in your mouth and seal your lips tightly around it. 5. Breathe in slowly and as deeply as possible, raising the piston or the ball toward the top of the column. 6. Hold your breath for 3-5 seconds or for as long as possible. Allow the piston or ball to fall to the bottom of the column. 7. Remove the mouthpiece from your mouth and breathe out normally. 8. Rest for a few seconds and repeat Steps 1 through 7 at least 10 times every 1-2 hours when you are awake. Take your time and take a few normal breaths between deep breaths. 9. The spirometer may include an indicator to show  your best effort. Use the indicator as a goal to work toward during each repetition. 10. After each set of 10 deep breaths, practice coughing to be sure your lungs are clear. If you have an incision (the cut made at the time of surgery), support your incision when coughing by placing a pillow or rolled up towels firmly against it. Once you are able to get out of bed, walk around indoors and cough well. You may stop using the incentive spirometer when instructed by your caregiver.  RISKS AND COMPLICATIONS  Take your time so you do not get dizzy or light-headed.  If you are in pain, you may need to take or ask for pain medication before doing incentive spirometry. It is harder to take a deep breath if you are having pain. AFTER USE  Rest and breathe slowly and easily.  It can be helpful to keep track of a log of your progress. Your caregiver can provide you with a simple table to help with this. If you are using the spirometer at home, follow these instructions: [place] IF:   You are having difficultly using the  spirometer.  You have trouble using the spirometer as often as instructed.  Your pain medication is not giving enough relief while using the spirometer.  You develop fever of 100.5 F (38.1 C) or higher. SEEK IMMEDIATE MEDICAL CARE IF:   You cough up bloody sputum that had not been present before.  You develop fever of 102 F (38.9 C) or greater.  You develop worsening pain at or near the incision site. MAKE SURE YOU:   Understand these instructions.  Will watch your condition.  Will get help right away if you are not doing well or get worse. Document Released: 01/17/2007 Document Revised: 11/29/2011 Document Reviewed: 03/20/2007 [facility] Patient Information 2014 [place], [place].   ________________________________________________________________________

## 2019-03-09 ENCOUNTER — Encounter (HOSPITAL_COMMUNITY)
Admission: RE | Admit: 2019-03-09 | Discharge: 2019-03-09 | Disposition: A | Discharge status: Home / Self Care | Payer: Medicare Other | Source: Ambulatory Visit | Attending: Orthopedic Surgery | Admitting: Orthopedic Surgery

## 2019-03-09 ENCOUNTER — Encounter (HOSPITAL_COMMUNITY): Payer: Self-pay

## 2019-03-09 ENCOUNTER — Other Ambulatory Visit: Payer: Self-pay

## 2019-03-09 DIAGNOSIS — M12811 Other specific arthropathies, not elsewhere classified, right shoulder: Secondary | ICD-10-CM | POA: Insufficient documentation

## 2019-03-09 DIAGNOSIS — Z01812 Encounter for preprocedural laboratory examination: Secondary | ICD-10-CM | POA: Insufficient documentation

## 2019-03-09 LAB — CBC
HCT: 43.5 % (ref 36.0–46.0)
Hemoglobin: 14.3 g/dL (ref 12.0–15.0)
MCH: 32.7 pg (ref 26.0–34.0)
MCHC: 32.9 g/dL (ref 30.0–36.0)
MCV: 99.5 fL (ref 80.0–100.0)
Platelets: 403 10*3/uL — ABNORMAL HIGH (ref 150–400)
RBC: 4.37 MIL/uL (ref 3.87–5.11)
RDW: 12.2 % (ref 11.5–15.5)
WBC: 7.8 10*3/uL (ref 4.0–10.5)
nRBC: 0 % (ref 0.0–0.2)

## 2019-03-09 LAB — SURGICAL PCR SCREEN
MRSA, PCR: NEGATIVE
Staphylococcus aureus: NEGATIVE

## 2019-03-12 ENCOUNTER — Other Ambulatory Visit (HOSPITAL_COMMUNITY): Payer: Medicare Other

## 2019-03-13 ENCOUNTER — Other Ambulatory Visit (HOSPITAL_COMMUNITY)
Admission: RE | Admit: 2019-03-13 | Discharge: 2019-03-13 | Disposition: A | Discharge status: Home / Self Care | Payer: Medicare Other | Source: Ambulatory Visit | Attending: Orthopedic Surgery | Admitting: Orthopedic Surgery

## 2019-03-13 DIAGNOSIS — Z1159 Encounter for screening for other viral diseases: Secondary | ICD-10-CM | POA: Insufficient documentation

## 2019-03-13 LAB — SARS CORONAVIRUS 2 (TAT 6-24 HRS): SARS Coronavirus 2: NEGATIVE

## 2019-03-15 ENCOUNTER — Encounter (HOSPITAL_COMMUNITY): Payer: Self-pay | Admitting: Anesthesiology

## 2019-03-15 NOTE — Anesthesia Preprocedure Evaluation (Addendum)
Anesthesia Evaluation  Patient identified by MRN, date of birth, ID band Patient awake    Reviewed: Allergy & Precautions, NPO status , Patient's Chart, lab work & pertinent test results  Airway Mallampati: II  TM Distance: >3 FB Neck ROM: Full    Dental no notable dental hx. (+) Teeth Intact   Pulmonary former smoker,    Pulmonary exam normal breath sounds clear to auscultation       Cardiovascular negative cardio ROS Normal cardiovascular exam Rhythm:Regular Rate:Normal     Neuro/Psych  Headaches, Depression  Neuromuscular disease    GI/Hepatic negative GI ROS, Neg liver ROS,   Endo/Other  Hyperthyroidism   Renal/GU negative Renal ROS  negative genitourinary   Musculoskeletal  (+) Arthritis , Right shoulder cuff arthropathy   Abdominal   Peds  Hematology negative hematology ROS (+)   Anesthesia Other Findings   Reproductive/Obstetrics                            Anesthesia Physical Anesthesia Plan  ASA: II  Anesthesia Plan: General   Post-op Pain Management:  Regional for Post-op pain   Induction: Intravenous  PONV Risk Score and Plan: 4 or greater and Ondansetron, Treatment may vary due to age or medical condition and Dexamethasone  Airway Management Planned: Oral ETT  Additional Equipment:   Intra-op Plan:   Post-operative Plan: Extubation in OR  Informed Consent: I have reviewed the patients History and Physical, chart, labs and discussed the procedure including the risks, benefits and alternatives for the proposed anesthesia with the patient or authorized representative who has indicated his/her understanding and acceptance.     Dental advisory given  Plan Discussed with: CRNA and Surgeon  Anesthesia Plan Comments:        Anesthesia Quick Evaluation

## 2019-03-16 ENCOUNTER — Inpatient Hospital Stay (HOSPITAL_COMMUNITY): Payer: Medicare Other

## 2019-03-16 ENCOUNTER — Inpatient Hospital Stay (HOSPITAL_COMMUNITY): Payer: Medicare Other | Admitting: Physician Assistant

## 2019-03-16 ENCOUNTER — Inpatient Hospital Stay (HOSPITAL_COMMUNITY)
Admission: RE | Admit: 2019-03-16 | Discharge: 2019-03-17 | DRG: 483 | Disposition: A | Discharge status: Home / Self Care | Payer: Medicare Other | Attending: Orthopedic Surgery | Admitting: Orthopedic Surgery

## 2019-03-16 ENCOUNTER — Inpatient Hospital Stay (HOSPITAL_COMMUNITY): Payer: Medicare Other | Admitting: Certified Registered Nurse Anesthetist

## 2019-03-16 ENCOUNTER — Encounter (HOSPITAL_COMMUNITY)
Admission: RE | Disposition: A | Discharge status: Home / Self Care | Payer: Self-pay | Source: Home / Self Care | Attending: Orthopedic Surgery

## 2019-03-16 ENCOUNTER — Other Ambulatory Visit: Payer: Self-pay

## 2019-03-16 ENCOUNTER — Encounter (HOSPITAL_COMMUNITY): Payer: Self-pay | Admitting: *Deleted

## 2019-03-16 DIAGNOSIS — M19011 Primary osteoarthritis, right shoulder: Principal | ICD-10-CM | POA: Diagnosis present

## 2019-03-16 DIAGNOSIS — Z1159 Encounter for screening for other viral diseases: Secondary | ICD-10-CM | POA: Diagnosis not present

## 2019-03-16 DIAGNOSIS — M75101 Unspecified rotator cuff tear or rupture of right shoulder, not specified as traumatic: Secondary | ICD-10-CM | POA: Diagnosis present

## 2019-03-16 DIAGNOSIS — Z886 Allergy status to analgesic agent status: Secondary | ICD-10-CM | POA: Diagnosis not present

## 2019-03-16 DIAGNOSIS — Z79890 Hormone replacement therapy (postmenopausal): Secondary | ICD-10-CM | POA: Diagnosis not present

## 2019-03-16 DIAGNOSIS — M25711 Osteophyte, right shoulder: Secondary | ICD-10-CM | POA: Diagnosis present

## 2019-03-16 DIAGNOSIS — Z880 Allergy status to penicillin: Secondary | ICD-10-CM | POA: Diagnosis not present

## 2019-03-16 DIAGNOSIS — Z881 Allergy status to other antibiotic agents status: Secondary | ICD-10-CM | POA: Diagnosis not present

## 2019-03-16 DIAGNOSIS — Z79891 Long term (current) use of opiate analgesic: Secondary | ICD-10-CM | POA: Diagnosis not present

## 2019-03-16 DIAGNOSIS — K0889 Other specified disorders of teeth and supporting structures: Secondary | ICD-10-CM

## 2019-03-16 DIAGNOSIS — Z7982 Long term (current) use of aspirin: Secondary | ICD-10-CM | POA: Diagnosis not present

## 2019-03-16 DIAGNOSIS — Z885 Allergy status to narcotic agent status: Secondary | ICD-10-CM

## 2019-03-16 DIAGNOSIS — Z79899 Other long term (current) drug therapy: Secondary | ICD-10-CM

## 2019-03-16 DIAGNOSIS — Z96611 Presence of right artificial shoulder joint: Secondary | ICD-10-CM

## 2019-03-16 DIAGNOSIS — H9201 Otalgia, right ear: Secondary | ICD-10-CM

## 2019-03-16 DIAGNOSIS — Z888 Allergy status to other drugs, medicaments and biological substances status: Secondary | ICD-10-CM

## 2019-03-16 DIAGNOSIS — Z87891 Personal history of nicotine dependence: Secondary | ICD-10-CM | POA: Diagnosis not present

## 2019-03-16 HISTORY — PX: REVERSE SHOULDER ARTHROPLASTY: SHX5054

## 2019-03-16 HISTORY — DX: Myoneural disorder, unspecified: G70.9

## 2019-03-16 SURGERY — ARTHROPLASTY, SHOULDER, TOTAL, REVERSE
Anesthesia: General | Laterality: Right

## 2019-03-16 MED ORDER — BUPIVACAINE-EPINEPHRINE (PF) 0.25% -1:200000 IJ SOLN
INTRAMUSCULAR | Status: DC | PRN
  Administered 2019-03-16: 12 mL

## 2019-03-16 MED ORDER — FENTANYL CITRATE (PF) 250 MCG/5ML IJ SOLN
INTRAMUSCULAR | Status: AC
  Filled 2019-03-16: qty 5

## 2019-03-16 MED ORDER — DIPHENHYDRAMINE HCL 25 MG PO CAPS: 25.0000 mg | ORAL_CAPSULE | Freq: Two times a day (BID) | ORAL | Status: DC | PRN

## 2019-03-16 MED ORDER — METOCLOPRAMIDE HCL 5 MG PO TABS: 5.0000 mg | ORAL_TABLET | Freq: Three times a day (TID) | ORAL | Status: DC | PRN

## 2019-03-16 MED ORDER — SODIUM CHLORIDE 0.9 % IV SOLN
INTRAVENOUS | Status: DC | PRN
  Administered 2019-03-16: 35 ug/min via INTRAVENOUS

## 2019-03-16 MED ORDER — POLYETHYLENE GLYCOL 3350 17 G PO PACK: 17.0000 g | PACK | Freq: Every day | ORAL | Status: DC | PRN

## 2019-03-16 MED ORDER — METHOCARBAMOL 500 MG IVPB - SIMPLE MED
500.0000 mg | Freq: Four times a day (QID) | INTRAVENOUS | Status: DC | PRN
  Filled 2019-03-16: qty 50

## 2019-03-16 MED ORDER — TRIAMCINOLONE ACETONIDE 0.1 % EX CREA
1.0000 "application " | TOPICAL_CREAM | Freq: Two times a day (BID) | CUTANEOUS | Status: DC | PRN
  Filled 2019-03-16: qty 15

## 2019-03-16 MED ORDER — SUCCINYLCHOLINE CHLORIDE 200 MG/10ML IV SOSY
PREFILLED_SYRINGE | INTRAVENOUS | Status: AC
  Filled 2019-03-16: qty 10

## 2019-03-16 MED ORDER — LORAZEPAM 1 MG PO TABS
2.0000 mg | ORAL_TABLET | Freq: Four times a day (QID) | ORAL | Status: DC | PRN
  Administered 2019-03-16 – 2019-03-17 (×2): 2 mg via ORAL
  Filled 2019-03-16 (×2): qty 2

## 2019-03-16 MED ORDER — SUCRALFATE 1 G PO TABS
1.0000 g | ORAL_TABLET | Freq: Three times a day (TID) | ORAL | Status: DC
  Administered 2019-03-16 – 2019-03-17 (×4): 1 g via ORAL
  Filled 2019-03-16 (×4): qty 1

## 2019-03-16 MED ORDER — EPHEDRINE 5 MG/ML INJ
INTRAVENOUS | Status: AC
  Filled 2019-03-16: qty 10

## 2019-03-16 MED ORDER — BUPIVACAINE-EPINEPHRINE (PF) 0.25% -1:200000 IJ SOLN
INTRAMUSCULAR | Status: AC
  Filled 2019-03-16: qty 30

## 2019-03-16 MED ORDER — GLYCOPYRROLATE PF 0.2 MG/ML IJ SOSY
PREFILLED_SYRINGE | INTRAMUSCULAR | Status: AC
  Filled 2019-03-16: qty 1

## 2019-03-16 MED ORDER — LIDOCAINE 2% (20 MG/ML) 5 ML SYRINGE
INTRAMUSCULAR | Status: DC | PRN
  Administered 2019-03-16: 40 mg via INTRAVENOUS

## 2019-03-16 MED ORDER — ACETAMINOPHEN 325 MG PO TABS: 325.0000 mg | ORAL_TABLET | Freq: Four times a day (QID) | ORAL | Status: DC | PRN

## 2019-03-16 MED ORDER — CEFAZOLIN SODIUM-DEXTROSE 2-4 GM/100ML-% IV SOLN
INTRAVENOUS | Status: AC
  Filled 2019-03-16: qty 100

## 2019-03-16 MED ORDER — FENTANYL CITRATE (PF) 100 MCG/2ML IJ SOLN
INTRAMUSCULAR | Status: DC | PRN
  Administered 2019-03-16 (×2): 50 ug via INTRAVENOUS

## 2019-03-16 MED ORDER — MIDAZOLAM HCL 5 MG/5ML IJ SOLN
INTRAMUSCULAR | Status: DC | PRN
  Administered 2019-03-16: 1 mg via INTRAVENOUS

## 2019-03-16 MED ORDER — EPHEDRINE SULFATE-NACL 50-0.9 MG/10ML-% IV SOSY
PREFILLED_SYRINGE | INTRAVENOUS | Status: DC | PRN
  Administered 2019-03-16: 10 mg via INTRAVENOUS
  Administered 2019-03-16: 5 mg via INTRAVENOUS
  Administered 2019-03-16: 10 mg via INTRAVENOUS

## 2019-03-16 MED ORDER — ONDANSETRON HCL 4 MG/2ML IJ SOLN
INTRAMUSCULAR | Status: AC
  Filled 2019-03-16: qty 2

## 2019-03-16 MED ORDER — MIDAZOLAM HCL 2 MG/2ML IJ SOLN
INTRAMUSCULAR | Status: AC
  Filled 2019-03-16: qty 2

## 2019-03-16 MED ORDER — CEFAZOLIN SODIUM-DEXTROSE 2-3 GM-%(50ML) IV SOLR
INTRAVENOUS | Status: DC | PRN
  Administered 2019-03-16: 2 g via INTRAVENOUS

## 2019-03-16 MED ORDER — FENTANYL CITRATE (PF) 100 MCG/2ML IJ SOLN
INTRAMUSCULAR | Status: AC
  Filled 2019-03-16: qty 2

## 2019-03-16 MED ORDER — FERROUS SULFATE 325 (65 FE) MG PO TABS: 325.0000 mg | ORAL_TABLET | ORAL | Status: DC

## 2019-03-16 MED ORDER — SODIUM CHLORIDE 0.9 % IR SOLN
Status: DC | PRN
  Administered 2019-03-16: 1000 mL

## 2019-03-16 MED ORDER — ADULT MULTIVITAMIN W/MINERALS CH
1.0000 | ORAL_TABLET | Freq: Every day | ORAL | Status: DC
  Administered 2019-03-16 – 2019-03-17 (×2): 1 via ORAL
  Filled 2019-03-16 (×2): qty 1

## 2019-03-16 MED ORDER — SERTRALINE HCL 50 MG PO TABS
50.0000 mg | ORAL_TABLET | Freq: Every day | ORAL | Status: DC
  Administered 2019-03-17: 10:00:00 50 mg via ORAL
  Filled 2019-03-16 (×2): qty 1

## 2019-03-16 MED ORDER — LIDOCAINE 2% (20 MG/ML) 5 ML SYRINGE
INTRAMUSCULAR | Status: AC
  Filled 2019-03-16: qty 5

## 2019-03-16 MED ORDER — DOCUSATE SODIUM 100 MG PO CAPS
100.0000 mg | ORAL_CAPSULE | Freq: Two times a day (BID) | ORAL | Status: DC
  Administered 2019-03-16 – 2019-03-17 (×3): 100 mg via ORAL
  Filled 2019-03-16 (×2): qty 1

## 2019-03-16 MED ORDER — METHOCARBAMOL 500 MG PO TABS: 500.0000 mg | ORAL_TABLET | Freq: Four times a day (QID) | ORAL | Status: DC | PRN

## 2019-03-16 MED ORDER — ROCURONIUM BROMIDE 10 MG/ML (PF) SYRINGE
PREFILLED_SYRINGE | INTRAVENOUS | Status: AC
  Filled 2019-03-16: qty 10

## 2019-03-16 MED ORDER — PANTOPRAZOLE SODIUM 40 MG PO TBEC
40.0000 mg | DELAYED_RELEASE_TABLET | Freq: Every day | ORAL | Status: DC
  Administered 2019-03-17: 10:00:00 40 mg via ORAL
  Filled 2019-03-16 (×2): qty 1

## 2019-03-16 MED ORDER — LACTATED RINGERS IV SOLN
INTRAVENOUS | Status: DC
  Administered 2019-03-16: 06:00:00 via INTRAVENOUS

## 2019-03-16 MED ORDER — MENTHOL 3 MG MT LOZG: 1.0000 | LOZENGE | OROMUCOSAL | Status: DC | PRN

## 2019-03-16 MED ORDER — FENTANYL CITRATE (PF) 100 MCG/2ML IJ SOLN
25.0000 ug | INTRAMUSCULAR | Status: DC | PRN
  Administered 2019-03-16 (×2): 50 ug via INTRAVENOUS

## 2019-03-16 MED ORDER — ONDANSETRON HCL 4 MG/2ML IJ SOLN: 4.0000 mg | Freq: Once | INTRAMUSCULAR | Status: DC | PRN

## 2019-03-16 MED ORDER — CHLORHEXIDINE GLUCONATE 4 % EX LIQD: 60.0000 mL | Freq: Once | CUTANEOUS | Status: DC

## 2019-03-16 MED ORDER — FERROUS SULFATE DRIED ER 45 MG PO TBCR: 45.0000 mg | EXTENDED_RELEASE_TABLET | ORAL | Status: DC

## 2019-03-16 MED ORDER — ONDANSETRON HCL 4 MG PO TABS: 4.0000 mg | ORAL_TABLET | Freq: Four times a day (QID) | ORAL | Status: DC | PRN

## 2019-03-16 MED ORDER — DEXAMETHASONE SODIUM PHOSPHATE 10 MG/ML IJ SOLN
INTRAMUSCULAR | Status: AC
  Filled 2019-03-16: qty 1

## 2019-03-16 MED ORDER — BISACODYL 10 MG RE SUPP: 10.0000 mg | Freq: Every day | RECTAL | Status: DC | PRN

## 2019-03-16 MED ORDER — CALCIUM POLYCARBOPHIL 625 MG PO TABS
625.0000 mg | ORAL_TABLET | Freq: Every day | ORAL | Status: DC
  Administered 2019-03-16 – 2019-03-17 (×2): 625 mg via ORAL
  Filled 2019-03-16 (×2): qty 1

## 2019-03-16 MED ORDER — CLINDAMYCIN PHOSPHATE 900 MG/50ML IV SOLN: 900.0000 mg | INTRAVENOUS | Status: DC

## 2019-03-16 MED ORDER — SUCCINYLCHOLINE CHLORIDE 200 MG/10ML IV SOSY
PREFILLED_SYRINGE | INTRAVENOUS | Status: DC | PRN
  Administered 2019-03-16: 80 mg via INTRAVENOUS

## 2019-03-16 MED ORDER — ACETAMINOPHEN 500 MG PO TABS: 1000.0000 mg | ORAL_TABLET | Freq: Four times a day (QID) | ORAL | Status: DC | PRN

## 2019-03-16 MED ORDER — PHENOL 1.4 % MT LIQD
1.0000 | OROMUCOSAL | Status: DC | PRN
  Filled 2019-03-16: qty 177

## 2019-03-16 MED ORDER — PROPOFOL 10 MG/ML IV BOLUS
INTRAVENOUS | Status: DC | PRN
  Administered 2019-03-16: 100 mg via INTRAVENOUS

## 2019-03-16 MED ORDER — SODIUM CHLORIDE 0.9 % IV SOLN
INTRAVENOUS | Status: DC
  Administered 2019-03-16 – 2019-03-17 (×2): via INTRAVENOUS

## 2019-03-16 MED ORDER — ONDANSETRON HCL 4 MG/2ML IJ SOLN
4.0000 mg | Freq: Four times a day (QID) | INTRAMUSCULAR | Status: DC | PRN
  Administered 2019-03-16: 4 mg via INTRAVENOUS
  Filled 2019-03-16: qty 2

## 2019-03-16 MED ORDER — METHOCARBAMOL 500 MG PO TABS: 500.0000 mg | ORAL_TABLET | Freq: Three times a day (TID) | ORAL | 1 refills | Status: DC | PRN

## 2019-03-16 MED ORDER — ONDANSETRON HCL 4 MG/2ML IJ SOLN
INTRAMUSCULAR | Status: DC | PRN
  Administered 2019-03-16: 4 mg via INTRAVENOUS

## 2019-03-16 MED ORDER — HYDROCODONE-ACETAMINOPHEN 5-325 MG PO TABS
1.0000 | ORAL_TABLET | ORAL | Status: DC | PRN
  Administered 2019-03-16 (×2): 1 via ORAL
  Filled 2019-03-16 (×2): qty 1

## 2019-03-16 MED ORDER — PROPOFOL 10 MG/ML IV BOLUS
INTRAVENOUS | Status: AC
  Filled 2019-03-16: qty 20

## 2019-03-16 MED ORDER — PHENYLEPHRINE HCL (PRESSORS) 10 MG/ML IV SOLN
INTRAVENOUS | Status: AC
  Filled 2019-03-16: qty 1

## 2019-03-16 MED ORDER — DEXAMETHASONE SODIUM PHOSPHATE 10 MG/ML IJ SOLN
INTRAMUSCULAR | Status: DC | PRN
  Administered 2019-03-16: 10 mg via INTRAVENOUS

## 2019-03-16 MED ORDER — CEFAZOLIN SODIUM-DEXTROSE 2-4 GM/100ML-% IV SOLN
2.0000 g | Freq: Four times a day (QID) | INTRAVENOUS | Status: AC
  Administered 2019-03-16 – 2019-03-17 (×3): 2 g via INTRAVENOUS
  Filled 2019-03-16 (×3): qty 100

## 2019-03-16 MED ORDER — MORPHINE SULFATE (PF) 2 MG/ML IV SOLN: 0.5000 mg | INTRAVENOUS | Status: DC | PRN

## 2019-03-16 MED ORDER — NYSTATIN 100000 UNIT/GM EX CREA
1.0000 "application " | TOPICAL_CREAM | Freq: Two times a day (BID) | CUTANEOUS | Status: DC | PRN
  Filled 2019-03-16: qty 15

## 2019-03-16 MED ORDER — GLYCOPYRROLATE PF 0.2 MG/ML IJ SOSY
PREFILLED_SYRINGE | INTRAMUSCULAR | Status: DC | PRN
  Administered 2019-03-16: .2 mg via INTRAVENOUS

## 2019-03-16 MED ORDER — LEVOTHYROXINE SODIUM 112 MCG PO TABS
112.0000 ug | ORAL_TABLET | Freq: Every day | ORAL | Status: DC
  Administered 2019-03-17: 112 ug via ORAL
  Filled 2019-03-16: qty 1

## 2019-03-16 MED ORDER — METOCLOPRAMIDE HCL 5 MG/ML IJ SOLN: 5.0000 mg | Freq: Three times a day (TID) | INTRAMUSCULAR | Status: DC | PRN

## 2019-03-16 MED ORDER — HYDROCODONE-ACETAMINOPHEN 5-325 MG PO TABS: 1.0000 | ORAL_TABLET | Freq: Four times a day (QID) | ORAL | 0 refills | Status: DC | PRN

## 2019-03-16 SURGICAL SUPPLY — 75 items
AID PSTN UNV HD RSTRNT DISP (MISCELLANEOUS) ×1
BAG SPEC THK2 15X12 ZIP CLS (MISCELLANEOUS) ×1
BAG ZIPLOCK 12X15 (MISCELLANEOUS) ×3 IMPLANT
BIT DRILL 1.6MX128 (BIT) ×1 IMPLANT
BIT DRILL 1.6MX128MM (BIT) ×1
BIT DRILL 170X2.5X (BIT) IMPLANT
BIT DRL 170X2.5X (BIT) ×1
BLADE EXTENDED COATED 6.5IN (ELECTRODE) ×3 IMPLANT
BLADE SAG 18X100X1.27 (BLADE) ×3 IMPLANT
CLOSURE WOUND 1/2 X4 (GAUZE/BANDAGES/DRESSINGS) ×1
COVER BACK TABLE 60X90IN (DRAPES) ×2 IMPLANT
COVER SURGICAL LIGHT HANDLE (MISCELLANEOUS) ×3 IMPLANT
COVER WAND RF STERILE (DRAPES) IMPLANT
DECANTER SPIKE VIAL GLASS SM (MISCELLANEOUS) ×3 IMPLANT
DRAPE INCISE IOBAN 66X45 STRL (DRAPES) ×3 IMPLANT
DRAPE ORTHO SPLIT 77X108 STRL (DRAPES) ×6
DRAPE SHEET LG 3/4 BI-LAMINATE (DRAPES) ×2 IMPLANT
DRAPE SURG ORHT 6 SPLT 77X108 (DRAPES) ×2 IMPLANT
DRAPE U-SHAPE 47X51 STRL (DRAPES) ×3 IMPLANT
DRILL 2.5 (BIT) ×3
DRSG ADAPTIC 3X8 NADH LF (GAUZE/BANDAGES/DRESSINGS) ×3 IMPLANT
DRSG PAD ABDOMINAL 8X10 ST (GAUZE/BANDAGES/DRESSINGS) ×3 IMPLANT
DURAPREP 26ML APPLICATOR (WOUND CARE) ×3 IMPLANT
ECCENTRIC EPIPHYSI MODULAR SZ1 (Trauma) IMPLANT
ELECT BLADE TIP CTD 4 INCH (ELECTRODE) ×2 IMPLANT
ELECT NDL TIP 2.8 STRL (NEEDLE) ×1 IMPLANT
ELECT NEEDLE TIP 2.8 STRL (NEEDLE) ×3 IMPLANT
ELECT REM PT RETURN 15FT ADLT (MISCELLANEOUS) ×3 IMPLANT
GAUZE SPONGE 4X4 12PLY STRL (GAUZE/BANDAGES/DRESSINGS) ×3 IMPLANT
GLENOSPHERE DXTEND STD 38 (Orthopedic Implant) IMPLANT
GLENSOPHERE DXTEND STD 38 (Orthopedic Implant) ×3 IMPLANT
GLOVE BIOGEL PI ORTHO PRO 7.5 (GLOVE) ×2
GLOVE BIOGEL PI ORTHO PRO SZ8 (GLOVE) ×2
GLOVE ORTHO TXT STRL SZ7.5 (GLOVE) ×3 IMPLANT
GLOVE PI ORTHO PRO STRL 7.5 (GLOVE) ×1 IMPLANT
GLOVE PI ORTHO PRO STRL SZ8 (GLOVE) ×1 IMPLANT
GLOVE SURG ORTHO 8.5 STRL (GLOVE) ×3 IMPLANT
GOWN STRL REUS W/TWL XL LVL3 (GOWN DISPOSABLE) ×6 IMPLANT
KIT BASIN OR (CUSTOM PROCEDURE TRAY) ×3 IMPLANT
KIT TURNOVER KIT A (KITS) IMPLANT
MANIFOLD NEPTUNE II (INSTRUMENTS) ×3 IMPLANT
METAGLENE DELTA EXTEND (Trauma) IMPLANT
METAGLENE DXTEND (Trauma) ×3 IMPLANT
MODULAR ECCENTRIC EPIPHYSI SZ1 (Trauma) ×3 IMPLANT
NDL MAYO CATGUT SZ4 TPR NDL (NEEDLE) IMPLANT
NEEDLE MAYO CATGUT SZ4 (NEEDLE) ×3 IMPLANT
NS IRRIG 1000ML POUR BTL (IV SOLUTION) ×3 IMPLANT
PACK SHOULDER (CUSTOM PROCEDURE TRAY) ×3 IMPLANT
PIN GUIDE 1.2 (PIN) ×2 IMPLANT
PIN GUIDE GLENOPHERE 1.5MX300M (PIN) ×2 IMPLANT
PIN METAGLENE 2.5 (PIN) ×2 IMPLANT
PROTECTOR NERVE ULNAR (MISCELLANEOUS) ×3 IMPLANT
RESTRAINT HEAD UNIVERSAL NS (MISCELLANEOUS) ×3 IMPLANT
SCREW 4.5X18MM (Screw) ×3 IMPLANT
SCREW 4.5X36MM (Screw) ×2 IMPLANT
SCREW 48L (Screw) ×2 IMPLANT
SCREW BN 18X4.5XSTRL SHLDR (Screw) IMPLANT
SLING ARM FOAM STRAP LRG (SOFTGOODS) ×2 IMPLANT
SPACER 38 PLUS 3 (Spacer) ×2 IMPLANT
SPONGE LAP 4X18 RFD (DISPOSABLE) IMPLANT
STEM 12 HA (Stem) ×2 IMPLANT
STRIP CLOSURE SKIN 1/2X4 (GAUZE/BANDAGES/DRESSINGS) ×2 IMPLANT
SUCTION FRAZIER HANDLE 10FR (MISCELLANEOUS) ×2
SUCTION TUBE FRAZIER 10FR DISP (MISCELLANEOUS) ×1 IMPLANT
SUT FIBERWIRE #2 38 T-5 BLUE (SUTURE) ×6
SUT MNCRL AB 4-0 PS2 18 (SUTURE) ×3 IMPLANT
SUT VIC AB 0 CT1 36 (SUTURE) ×6 IMPLANT
SUT VIC AB 0 CT2 27 (SUTURE) ×3 IMPLANT
SUT VIC AB 2-0 CT1 27 (SUTURE) ×3
SUT VIC AB 2-0 CT1 TAPERPNT 27 (SUTURE) ×1 IMPLANT
SUTURE FIBERWR #2 38 T-5 BLUE (SUTURE) ×1 IMPLANT
TOWEL OR 17X26 10 PK STRL BLUE (TOWEL DISPOSABLE) ×3 IMPLANT
TOWER CARTRIDGE SMART MIX (DISPOSABLE) IMPLANT
WATER STERILE IRR 1000ML POUR (IV SOLUTION) ×4 IMPLANT
YANKAUER SUCT BULB TIP 10FT TU (MISCELLANEOUS) ×3 IMPLANT

## 2019-03-16 NOTE — Interval H&P Note (Signed)
History and Physical Interval Note:  03/16/2019 7:30 AM  [name]  has presented today for surgery, with the diagnosis of Right shoulder osteoarthritis.  The various methods of treatment have been discussed with the patient and family. After consideration of risks, benefits and other options for treatment, the patient has consented to  Procedure(s) with comments: REVERSE SHOULDER ARTHROPLASTY (Right) - interscalene block as a surgical intervention.  The patient's history has been reviewed, patient examined, no change in status, stable for surgery.  I have reviewed the patient's chart and labs.  Questions were answered to the patient's satisfaction.     Augustin Schooling

## 2019-03-16 NOTE — Op Note (Signed)
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  PROCEDURE PERFORMED:  Right shoulder reverse total shoulder arthroplasty using DePuy Delta Xtend prosthesis.  ATTENDING SURGEON:  Esmond Plants, MD  ASSISTANT:  Darol Destine, [place], who was scrubbed during the entire procedure and necessary for satisfactory completion of surgery.  ANESTHESIA:  General anesthesia was used plus interscalene block.  ESTIMATED BLOOD LOSS:  150 mL.  FLUID REPLACEMENT:  1500 mL crystalloid.  INSTRUMENT COUNTS:  Correct.  COMPLICATIONS:  No complications.  ANTIBIOTICS:  Perioperative antibiotics were given.  INDICATIONS:  The patient is a 73 year old female with worsening right shoulder pain and dysfunction secondary to rotator cuff insufficiency and end-stage arthritis.  The patient has had progressive pain despite conservative management and presents for  operative treatment to restore function and eliminate pain.  Informed consent obtained.  DESCRIPTION OF PROCEDURE:  After an adequate level of anesthesia was achieved, the patient was positioned in modified beach-chair position.  Right shoulder correctly identified and sterilely prepped and draped in the usual manner.  Time-out called  verifying correct patient, correct site.  We entered the patient's shoulder using standard deltopectoral approach starting at the coracoid process, extending down to the anterior humerus.  Dissection down through subcutaneous tissues using needle-tip  Bovie.  Identified the cephalic vein and took it laterally, the deltoid pectoralis taken medially.  Conjoined tendon  identified and retracted medially, and we placed our deep retractors.  I tenodesed the biceps in situ with 0 Vicryl figure-of-eight  suture, tenodesing distally around the pectoralis insertion.  Next, we released the subscapularis subperiosteally off the lesser tuberosity and tagged for repair.  We released the inferior capsule, extending the shoulder and delivering the humeral head  out of the wound.  There was end-stage arthritis with bone exposed.  We entered the proximal humerus with a 6 mm reamer, reamed up to a size 12 mm.  We then went ahead and cut the head 10 degrees of retroversion using the head resection guide and the  oscillating saw.  We removed the head to the back table and used that for available bone graft for the conclusion of the surgery.  We then removed the excess osteophytes with a rongeur.  We then reduced the shoulder back into the wound and then retracted  that posteriorly.  I was able to do a 360-degree exposure of the glenoid and to remove the capsule and the glenoid labrum.  Once we had good exposure, I placed a central guide pin and then reamed for the metaglene baseplate.  Then we drilled our central  peg hole.  We did our peripheral hand reaming.  We then impacted the standard baseplate for the Delta Xtend prosthesis, paying attention to the 6 and 12 o'clock positions.  We placed a 48 screw at the 6:00 position with good purchase and a 36 screw  superiorly into the base of the coracoid and an 18 nonlocked posteriorly.  We had excellent baseplate security and fixation and stability.  We then placed a 38 standard glenosphere into position and then impacted that into place and screwed that home.   Once it was secure, I did a finger sweep to make sure no soft tissue was incorporated in that construct.  We irrigated thoroughly and then extended the shoulder back and completed our humeral preparation with the reamer.  We reamed for a size 1 right and  then placed the 12 stem with a  1 right metaphysis set on zero setting and placed in 10 degrees of retroversion and impacted into position, reduced with a 38+3 poly trial, and we were happy with our soft tissue balancing and tension, nice and stable  throughout a full arc of motion, no impingement.  We removed the trial components, irrigated thoroughly, placed drill holes in the lesser tuberosity and #2 FiberWire.  That was going to be for repair of the subscapularis.  We made sure that the subscap  was free from the coracoid and that it was not going to restrict our range of motion, which it did not.  We then used impaction grafting technique with available bone graft and impacted the HA-coated press-fit 12 stem, 1 right metaphysis set on the 0  setting and placed in 10 degrees of retroversion with bone graft.  We had excellent stem security.  Once that was impacted in position, we selected the 38+3 poly, impacted that in place and reduced the shoulder, had nice little pop as it seated.  We then  repaired the subscapularis anatomically back to bone, back to the lesser tuberosity, and then we checked to make sure that it did not restrict her motion, which it did not, and there was no impingement.  We irrigated thoroughly, and then we repaired the  deltopectoral interval with 0 Vicryl suture followed by 2-0 Vicryl for subcutaneous closure and 4-0 Monocryl for skin.  Steri-Strips applied followed by a sterile dressing.  The patient tolerated the surgery well.  LN/NUANCE  D:03/16/2019 T:03/16/2019 JOB:[id number]

## 2019-03-16 NOTE — Transfer of Care (Signed)
Immediate Anesthesia Transfer of Care Note  Patient: [name]  Procedure(s) Performed: REVERSE SHOULDER ARTHROPLASTY (Right )  Patient Location: PACU  Anesthesia Type:General  Level of Consciousness: awake, alert  and oriented  Airway & Oxygen Therapy: Patient Spontanous Breathing and Patient connected to face mask oxygen  Post-op Assessment: Report given to RN and Post -op Vital signs reviewed and stable  Post vital signs: Reviewed and stable  Last Vitals:  Vitals Value Taken Time  BP 139/67 03/16/19 0932  Temp    Pulse 90 03/16/19 0932  Resp 26 03/16/19 0932  SpO2 96 % 03/16/19 0932  Vitals shown include unvalidated device data.  Last Pain:  Vitals:   03/16/19 0600  TempSrc:   PainSc: 4       Patients Stated Pain Goal: 4 (35/68/61 6837)  Complications: No apparent anesthesia complications

## 2019-03-16 NOTE — Anesthesia Procedure Notes (Signed)
Anesthesia Regional Block: Interscalene brachial plexus block   Pre-Anesthetic Checklist: ,, timeout performed, Correct Patient, Correct Site, Correct Laterality, Correct Procedure, Correct Position, site marked, Risks and benefits discussed,  Surgical consent,  Pre-op evaluation,  At surgeon's request and post-op pain management  Laterality: Right  Prep: chloraprep       Needles:  Injection technique: Single-shot  Needle Type: Echogenic Stimulator Needle     Needle Length: 9cm  Needle Gauge: 21   Needle insertion depth: 4 cm   Additional Needles:   Procedures:,,,, ultrasound used (permanent image in chart),,,,  Motor weakness within 5 minutes.  Narrative:  Start time: 03/16/2019 7:19 AM End time: 03/16/2019 7:24 AM Injection made incrementally with aspirations every 5 mL.  Performed by: Personally  Anesthesiologist: Josephine Igo, MD  Additional Notes: Timeout performed. Patient sedated. Relevant anatomy ID'd using [place]. Incremental 2-4ml injection of LA with frequent aspiration. Patient tolerated procedure well.        Right Interscalene Block

## 2019-03-16 NOTE — Anesthesia Postprocedure Evaluation (Signed)
Anesthesia Post Note  Patient: [name]  Procedure(s) Performed: REVERSE SHOULDER ARTHROPLASTY (Right )     Patient location during evaluation: PACU Anesthesia Type: General Level of consciousness: awake and alert and oriented Pain management: pain level controlled Vital Signs Assessment: post-procedure vital signs reviewed and stable Respiratory status: spontaneous breathing, nonlabored ventilation, respiratory function stable and patient connected to nasal cannula oxygen Cardiovascular status: blood pressure returned to baseline and stable Postop Assessment: no apparent nausea or vomiting Anesthetic complications: no    Last Vitals:  Vitals:   03/16/19 1406 03/16/19 1409  BP: (!) 146/77 129/67  Pulse: 89 70  Resp: 16   Temp: 36.7 C 37 C  SpO2: 94% 100%    Last Pain:  Vitals:   03/16/19 1226  TempSrc:   PainSc: 1                  Loveda Colaizzi A.

## 2019-03-16 NOTE — Anesthesia Procedure Notes (Signed)
Procedure Name: Intubation Date/Time: 03/16/2019 7:41 AM Performed by: Maxwell Caul, CRNA Pre-anesthesia Checklist: Patient identified, Emergency Drugs available, Suction available and Patient being monitored Patient Re-evaluated:Patient Re-evaluated prior to induction Oxygen Delivery Method: Circle system utilized Preoxygenation: Pre-oxygenation with 100% oxygen Induction Type: IV induction Laryngoscope Size: Mac and 3 Grade View: Grade I Tube type: Oral Tube size: 7.5 mm Number of attempts: 1 Airway Equipment and Method: Stylet Placement Confirmation: ETT inserted through vocal cords under direct vision,  positive ETCO2 and breath sounds checked- equal and bilateral Secured at: 21 cm Tube secured with: Tape Dental Injury: Teeth and Oropharynx as per pre-operative assessment

## 2019-03-16 NOTE — Brief Op Note (Signed)
03/16/2019  9:21 AM  PATIENT:  [name]  73 y.o. female  PRE-OPERATIVE DIAGNOSIS:  Right shoulder osteoarthritis, rotator cuff insufficiency  POST-OPERATIVE DIAGNOSIS:  Right shoulder osteoarthritis, rotator cuff insuficiency  PROCEDURE:  Procedure(s) with comments: REVERSE SHOULDER ARTHROPLASTY (Right) - interscalene block DePuy Delta Xtend   SURGEON:  Surgeon(s) and Role:    Netta Cedars, MD - Primary  PHYSICIAN ASSISTANT:   ASSISTANTS: [place] Bruns, PA-C   ANESTHESIA:   regional and general  EBL:  50 mL   BLOOD ADMINISTERED:none  DRAINS: none   LOCAL MEDICATIONS USED:  MARCAINE     SPECIMEN:  No Specimen  DISPOSITION OF SPECIMEN:  N/A  COUNTS:  YES  TOURNIQUET:  * No tourniquets in log *  DICTATION: .Other Dictation: Dictation Number [id number]  PLAN OF CARE: Admit to inpatient   PATIENT DISPOSITION:  PACU - hemodynamically stable.   Delay start of Pharmacological VTE agent (>24hrs) due to surgical blood loss or risk of bleeding: not applicable

## 2019-03-17 LAB — HEMOGLOBIN AND HEMATOCRIT, BLOOD
HCT: 38.8 % (ref 36.0–46.0)
Hemoglobin: 12.7 g/dL (ref 12.0–15.0)

## 2019-03-17 LAB — BASIC METABOLIC PANEL
Anion gap: 14 (ref 5–15)
BUN: 10 mg/dL (ref 8–23)
CO2: 21 mmol/L — ABNORMAL LOW (ref 22–32)
Calcium: 9.2 mg/dL (ref 8.9–10.3)
Chloride: 106 mmol/L (ref 98–111)
Creatinine, Ser: 0.59 mg/dL (ref 0.44–1.00)
GFR calc Af Amer: >60 mL/min (ref >60)
GFR calc non Af Amer: >60 mL/min (ref >60)
Glucose, Bld: 155 mg/dL — ABNORMAL HIGH (ref 70–99)
Potassium: 4.4 mmol/L (ref 3.5–5.1)
Sodium: 141 mmol/L (ref 135–145)

## 2019-03-17 NOTE — Plan of Care (Signed)

## 2019-03-17 NOTE — Evaluation (Signed)
Occupational Therapy Evaluation Patient Details Name: Tabitha KettleDollie M Fusco MRN: [id number] DOB: 11-Dec-1945 Today's Date: 03/17/2019    History of Present Illness s/p R RTSA   Clinical Impression   This 73 year old female was admitted for the above sx. All education was completed. Pt will follow up with Dr Ranell PatrickNorris     Follow Up Recommendations  Follow surgeon's recommendation for DC plan and follow-up therapies    Equipment Recommendations  None recommended by OT    Recommendations for Other Services       Precautions / Restrictions Precautions Precautions: Shoulder Type of Shoulder Precautions: sling on at all times except for bathing, dressing, and exercise.  May move elbow, wrist and hand.  May use hand gently to reach to face Shoulder Interventions: Shoulder sling/immobilizer Precaution Booklet Issued: Yes (comment) Restrictions RUE Weight Bearing: Non weight bearing      Mobility Bed Mobility Overal bed mobility: Needs Assistance             General bed mobility comments: min a for trunk for OOB. Plans to sleep on couch with pillow behind arm  Transfers Overall transfer level: Independent                    Balance                                           ADL either performed or assessed with clinical judgement   ADL Overall ADL's : Needs assistance/impaired Eating/Feeding: Set up   Grooming: Wash/dry hands;Supervision/safety;Standing   Upper Body Bathing: Moderate assistance   Lower Body Bathing: Moderate assistance   Upper Body Dressing : Maximal assistance   Lower Body Dressing: Maximal assistance   Toilet Transfer: Supervision/safety   Toileting- Clothing Manipulation and Hygiene: Minimal assistance         General ADL Comments: ambulated to bathroom and used toilet.  Performed ADL from EOB.  Cues to give herself a wide berth when walking through doorways. Pt tends to move quickly. She verbalizes understanding of  protocol and precautions     Vision         Perception     Praxis      Pertinent Vitals/Pain Pain Assessment: Faces Faces Pain Scale: Hurts little more Pain Location: L upper arm Pain Descriptors / Indicators: Sore Pain Intervention(s): Limited activity within patient's tolerance;Monitored during session;Repositioned;Ice applied     Hand Dominance Right   Extremity/Trunk Assessment Upper Extremity Assessment Upper Extremity Assessment: RUE deficits/detail(block in effect. Able to move fingers, wrist a little)           Communication Communication Communication: No difficulties   Cognition Arousal/Alertness: Awake/alert Behavior During Therapy: WFL for tasks assessed/performed Overall Cognitive Status: Within Functional Limits for tasks assessed                                     General Comments       Exercises     Shoulder Instructions      Home Living Family/patient expects to be discharged to:: Private residence Living Arrangements: Spouse/significant other Available Help at Discharge: Family               Bathroom Shower/Tub: Tub/shower unit;Walk-in shower   Bathroom Toilet: Standard         Additional  Comments: doesn't have to use R UE for rails or to push up      Prior Functioning/Environment Level of Independence: Independent                 OT Problem List:        OT Treatment/Interventions:      OT Goals(Current goals can be found in the care plan section) Acute Rehab OT Goals Patient Stated Goal: return to independence OT Goal Formulation: All assessment and education complete, DC therapy  OT Frequency:     Barriers to D/C:            Co-evaluation              AM-PAC OT "6 Clicks" Daily Activity     Outcome Measure Help from another person eating meals?: A Little Help from another person taking care of personal grooming?: A Little Help from another person toileting, which includes using  toliet, bedpan, or urinal?: A Little Help from another person bathing (including washing, rinsing, drying)?: A Lot Help from another person to put on and taking off regular upper body clothing?: A Lot Help from another person to put on and taking off regular lower body clothing?: A Lot 6 Click Score: 15   End of Session    Activity Tolerance: Patient tolerated treatment well Patient left: in chair;with call bell/phone within reach  OT Visit Diagnosis: Muscle weakness (generalized) (M62.81);Pain Pain - Right/Left: Right Pain - part of body: Shoulder                Time: 4944-9675 OT Time Calculation (min): 28 min Charges:  OT General Charges $OT Visit: 1 Visit OT Evaluation $OT Eval Low Complexity: 1 Low OT Treatments $Self Care/Home Management : 8-22 mins  Lesle Chris, OTR/L Acute Rehabilitation Services [phone] [facility] pager [phone] office 03/17/2019  Shelly 03/17/2019, 9:18 AM

## 2019-03-17 NOTE — Discharge Summary (Signed)
Orthopedic Discharge Summary        Physician Discharge Summary  Patient ID: [name] MRN: [id number] DOB/AGE: September 20, 1946 73 y.o.  Admit date: 03/16/2019 Discharge date: 03/17/2019   Procedures:  Procedure(s) (LRB): REVERSE SHOULDER ARTHROPLASTY (Right)  Attending Physician:  Dr. Malon KindleSteven Maleke Feria  Admission Diagnoses:   Right shoulder rotator cuff tear arthropathy  Discharge Diagnoses:  same   Past Medical History:  Diagnosis Date  . Dysphagia   . HYPERTHYROIDISM   . LYMPHADENOPATHY   . MIGRAINE HEADACHE   . Neuromuscular disorder (HCC)    condition where muscle separates from bone left chest, quarter size, pain intermittent    PCP: Burton Apleyoberts, Ronald, MD   Discharged Condition: good  Hospital Course:  Patient underwent the above stated procedure on 03/16/2019. Patient tolerated the procedure well and brought to the recovery room in good condition and subsequently to the floor. Patient had an uncomplicated hospital course and was stable for discharge.   Disposition: Discharge disposition: 01-Home or Self Care      with follow up in 2 weeks   Follow-up Information    Beverely LowNorris, Reynolds Kittel, MD. Call in 2 weeks.   Specialty: Orthopedic Surgery Why: [phone] Contact information: [address] [place] [address] [phone]           Discharge Instructions    Call MD / Call 911   Complete by: As directed    If you experience chest pain or shortness of breath, CALL 911 and be transported to the hospital emergency room.  If you develope a fever above 101 F, pus (white drainage) or increased drainage or redness at the wound, or calf pain, call your surgeon's office.   Constipation Prevention   Complete by: As directed    Drink plenty of fluids.  Prune juice may be helpful.  You may use a stool softener, such as Colace (over the counter) 100 mg twice a day.  Use MiraLax (over the counter) for constipation as needed.   Diet - low sodium heart  healthy   Complete by: As directed    Increase activity slowly as tolerated   Complete by: As directed       Allergies as of 03/17/2019      Reactions   Aspirin    Avoid due to stomach issues   Cephalexin    Stomach cramps   Imipenem    Warm and tingly all over   Oxycodone    Hallucinations    Statins    leg pain, muscle cramps   Clindamycin/lincomycin Nausea And Vomiting, Rash   Penicillins Rash   Did it involve swelling of the face/tongue/throat, SOB, or low BP? No Did it involve sudden or severe rash/hives, skin peeling, or any reaction on the inside of your mouth or nose? Yes Did you need to seek medical attention at a hospital or doctor's office? No When did it last happen?2011 If all above answers are "NO", may proceed with cephalosporin use.   Quinolones Rash      Medication List    TAKE these medications   acetaminophen 500 MG tablet Commonly known as: TYLENOL Take 1,000 mg by mouth every 6 (six) hours as needed for moderate pain.   [employer] Slow Release Iron 45 MG Tbcr Generic drug: Ferrous Sulfate Dried Take 45 mg by mouth 2 (two) times a week. Mondays and Thursdays   diphenhydrAMINE 25 MG tablet Commonly known as: BENADRYL Take 25 mg by mouth 2 (two) times daily as  needed for allergies.   HYDROcodone-acetaminophen 5-325 MG tablet Commonly known as: NORCO/VICODIN Take 1-2 tablets by mouth every 6 (six) hours as needed for moderate pain or severe pain. What changed:   how much to take  reasons to take this   levothyroxine 112 MCG tablet Commonly known as: SYNTHROID Take 112 mcg by mouth daily before breakfast.   LORazepam 2 MG tablet Commonly known as: ATIVAN Take 2 mg by mouth 4 (four) times daily as needed for anxiety.   methocarbamol 500 MG tablet Commonly known as: Robaxin Take 1 tablet (500 mg total) by mouth 3 (three) times daily as needed.   multivitamin with minerals tablet Take 1 tablet by mouth daily.   nystatin cream Commonly  known as: MYCOSTATIN Apply 1 application topically 2 (two) times daily as needed for itching.   omeprazole 40 MG capsule Commonly known as: PRILOSEC Take 40 mg by mouth daily as needed (acid reflux).   polycarbophil 625 MG tablet Commonly known as: FIBERCON Take 625 mg by mouth daily.   REFRESH OP Place 1 drop into both eyes daily as needed (dry eyes).   sertraline 50 MG tablet Commonly known as: ZOLOFT Take 50 mg by mouth daily.   sucralfate 1 g tablet Commonly known as: CARAFATE Take 1 g by mouth 4 (four) times daily -  with meals and at bedtime.   triamcinolone cream 0.1 % Commonly known as: KENALOG Apply 1 application topically 2 (two) times daily as needed for itching.         Signed: Augustin Schooling 03/17/2019, 8:16 AM  [facility] is now [facility] [address], [place], [place], [place] [address] Phone: [place]

## 2019-03-17 NOTE — Progress Notes (Signed)
Orthopedics Progress Note  Subjective: Block starting to wear off and she is having some pain in the lateral arm.   Objective:  Vitals:   03/17/19 0024 03/17/19 0500  BP: 127/71 118/71  Pulse: 71 74  Resp: 20 20  Temp: 97.8 F (36.6 C) 98 F (36.7 C)  SpO2: 92% 92%    General: Awake and alert  Musculoskeletal: Dressing changed, incision CDI Neurovascularly intact  Lab Results  Component Value Date   WBC 7.8 03/09/2019   HGB 12.7 03/17/2019   HCT 38.8 03/17/2019   MCV 99.5 03/09/2019   PLT 403 (H) 03/09/2019       Component Value Date/Time   NA 141 03/17/2019 0237   K 4.4 03/17/2019 0237   CL 106 03/17/2019 0237   CO2 21 (L) 03/17/2019 0237   GLUCOSE 155 (H) 03/17/2019 0237   BUN 10 03/17/2019 0237   CREATININE 0.59 03/17/2019 0237   CALCIUM 9.2 03/17/2019 0237   GFRNONAA >60 03/17/2019 0237   GFRAA >60 03/17/2019 0237    Lab Results  Component Value Date   INR 1.00 07/01/2010   INR 1.30 06/21/2010    Assessment/Plan: POD 1 s/p Procedure(s): REVERSE SHOULDER ARTHROPLASTY Discharge to home after therapy  Remo Lipps R. Veverly Fells, MD 03/17/2019 8:14 AM

## 2019-03-17 NOTE — Discharge Instructions (Signed)
Ice to the shoulder constantly.  Keep the incision covered and clean and dry for one week, then ok to get it wet in the shower. Change bandage mid week with the extra gel bandage  Do exercise as instructed several times per day.  DO NOT reach behind your back or push up out of a chair with the operative arm.  Use a sling while you are up and around for comfort, may remove while seated.  Keep pillow propped behind the operative elbow.  Follow up with Dr Veverly Fells in two weeks in the office, call [phone] for appt

## 2019-03-19 ENCOUNTER — Encounter (HOSPITAL_COMMUNITY): Payer: Self-pay | Admitting: Orthopedic Surgery

## 2019-03-20 ENCOUNTER — Telehealth: Payer: Self-pay | Admitting: *Deleted

## 2019-03-20 NOTE — Telephone Encounter (Signed)
[id number]/ s/p shoulder replacement doing well some pain issues but mainly with trying to lie in bed and getting out of bed.  Is following the md's orders for rest and exercises.    Has f/u appointment for recheck in two weeks.  Husband is at the home helping her with adl's. Suanne Marker Davis,BSN,RN3,CCM,CN [phone]

## 2019-03-25 ENCOUNTER — Telehealth: Payer: Self-pay | Admitting: *Deleted

## 2019-03-25 NOTE — Telephone Encounter (Signed)
[id number]/TCF-Valeta/ having pain in the operative shoulder bladee, starting doing physical therapy in the affected side this week, discussed the types of physical therapy she is doing and one sh shoulder move that bring the elbow up to the ear is causing the most pain.  Suggested that this may be the causing movement for the pain.  On a scale of 1-10 the pain is at a 5 but is controlled with tylenol.  Suggested that she ice the area down after doing the exercises for at less twenty minutes and see if that does not help.  She is sleeping in the bed at this time which is an improvement over sleeping in her recliner.  Satisfied with call the call ewas ended and I would check back in on her in several days or she could call if symptoms get worse./Rhonda Davis,BSN,RN3,CCM.

## 2019-04-01 ENCOUNTER — Encounter: Payer: Self-pay | Admitting: *Deleted

## 2019-04-01 NOTE — Congregational Nurse Program (Signed)
  Dept: [facility] Nurse Program Note  Date of Encounter: 04/01/2019  Past Medical History: Past Medical History:  Diagnosis Date  . Dysphagia   . HYPERTHYROIDISM   . LYMPHADENOPATHY   . MIGRAINE HEADACHE   . Neuromuscular disorder (HCC)    condition where muscle separates from bone left chest, quarter size, pain intermittent    Encounter Details: CNP Questionnaire - 04/01/19 0820      Questionnaire   Patient Status  Not Applicable    Race  White or Caucasian    Location Patient Served At  Not Applicable    Insurance  Medicare    Uninsured  Not Applicable    Food  No food insecurities    Housing/Utilities  Yes, have permanent housing    Transportation  No transportation needs    Interpersonal Safety  Yes, feel physically and emotionally safe where you currently live    Medication  No medication insecurities    Medical Provider  Yes    Referrals  Not Applicable    ED Visit Averted  Yes    Life-Saving Intervention Made  Not Applicable

## 2019-04-18 ENCOUNTER — Ambulatory Visit
Admission: RE | Admit: 2019-04-18 | Discharge: 2019-04-18 | Disposition: A | Discharge status: Home / Self Care | Payer: Medicare Other | Source: Ambulatory Visit | Attending: Internal Medicine | Admitting: Internal Medicine

## 2019-04-18 ENCOUNTER — Other Ambulatory Visit: Payer: Self-pay | Admitting: Internal Medicine

## 2019-04-18 ENCOUNTER — Other Ambulatory Visit: Payer: Self-pay

## 2019-04-18 DIAGNOSIS — R05 Cough: Secondary | ICD-10-CM

## 2019-04-18 DIAGNOSIS — R059 Cough, unspecified: Secondary | ICD-10-CM

## 2019-08-22 ENCOUNTER — Telehealth: Payer: Self-pay | Admitting: *Deleted

## 2019-08-22 NOTE — Telephone Encounter (Signed)
[id number] for well being being check for her and for husband Dominica Severin.  Both are doing well.  Looking for ward to the holidays.  Dominica Severin is still having back trouble and she is with her shoulder but getting better.

## 2019-09-24 ENCOUNTER — Telehealth: Payer: Self-pay | Admitting: *Deleted

## 2019-09-24 NOTE — Telephone Encounter (Signed)
[id number]/tct-Dolly-husband [name] had surgery today offered support and prayers.  Will call as needed for support.  Rithwik Schmieg,BSN,RN3,CCM,CN

## 2019-09-25 ENCOUNTER — Telehealth: Payer: Self-pay | Admitting: *Deleted

## 2019-09-25 NOTE — Telephone Encounter (Signed)
010521/[name],BSN,RN3,CCM: TCT-dolly husband is in [place] [facility] after back surgery.  Having urinary retention foley removed this afternoon and may get to go home later today.  Will call for any needs or questions.

## 2019-11-05 ENCOUNTER — Ambulatory Visit: Payer: Medicare Other | Attending: Internal Medicine

## 2019-11-05 ENCOUNTER — Other Ambulatory Visit: Payer: Self-pay

## 2019-11-05 DIAGNOSIS — Z23 Encounter for immunization: Secondary | ICD-10-CM

## 2019-11-05 NOTE — Progress Notes (Signed)
   Covid-19 Vaccination Clinic  Name:  [name]    MRN: [id number] DOB: [birth year]  11/05/2019  [name] was observed post Covid-19 immunization for 15 minutes without incidence. She was provided with Vaccine Information Sheet and instruction to access the V-Safe system.   [name] was instructed to call 911 with any severe reactions post vaccine: Marland Kitchen Difficulty breathing  . Swelling of your face and throat  . A fast heartbeat  . A bad rash all over your body  . Dizziness and weakness    Immunizations Administered    Name Date Dose VIS Date Route   Moderna COVID-19 Vaccine 11/05/2019  2:42 PM 0.5 mL 08/21/2019 Intramuscular   Manufacturer: Moderna   Lot: [id number]   NDC: 02542-706-23

## 2019-12-04 ENCOUNTER — Ambulatory Visit: Payer: Medicare Other | Attending: Internal Medicine

## 2019-12-04 DIAGNOSIS — Z23 Encounter for immunization: Secondary | ICD-10-CM

## 2019-12-04 NOTE — Progress Notes (Signed)
   Covid-19 Vaccination Clinic  Name:  [name]    MRN: [id number] DOB: 1946-06-27  12/04/2019  [name] was observed post Covid-19 immunization for 15 minutes without incident. She was provided with Vaccine Information Sheet and instruction to access the V-Safe system.   Ms. Sick was instructed to call 911 with any severe reactions post vaccine: Marland Kitchen Difficulty breathing  . Swelling of face and throat  . A fast heartbeat  . A bad rash all over body  . Dizziness and weakness   Immunizations Administered    Name Date Dose VIS Date Route   Moderna COVID-19 Vaccine 12/04/2019  3:05 PM 0.5 mL 08/21/2019 Intramuscular   Manufacturer: Moderna   Lot: [id number]   NDC: 24932-419-91

## 2020-08-05 ENCOUNTER — Ambulatory Visit
Admission: RE | Admit: 2020-08-05 | Discharge: 2020-08-05 | Disposition: A | Discharge status: Home / Self Care | Payer: Medicare Other | Source: Ambulatory Visit | Attending: Physician Assistant | Admitting: Physician Assistant

## 2020-08-05 ENCOUNTER — Other Ambulatory Visit: Payer: Self-pay | Admitting: Physician Assistant

## 2020-08-05 DIAGNOSIS — R935 Abnormal findings on diagnostic imaging of other abdominal regions, including retroperitoneum: Secondary | ICD-10-CM

## 2020-08-21 ENCOUNTER — Encounter: Payer: Self-pay | Admitting: Neurology

## 2020-08-21 ENCOUNTER — Other Ambulatory Visit: Payer: Self-pay

## 2020-08-21 ENCOUNTER — Ambulatory Visit: Payer: Medicare Other | Admitting: Neurology

## 2020-08-21 VITALS — BP 127/71 | HR 60 | Ht 60.0 in | Wt 162.0 lb

## 2020-08-21 DIAGNOSIS — G629 Polyneuropathy, unspecified: Secondary | ICD-10-CM | POA: Diagnosis not present

## 2020-08-21 DIAGNOSIS — Z79899 Other long term (current) drug therapy: Secondary | ICD-10-CM

## 2020-08-21 NOTE — Progress Notes (Signed)
[facility]    Provider:  Dr Jaynee Eagles Requesting Provider: Inocencio Homes, DPM, [place] Primary Care Provider:  Lorene Dy, MD  CC:  Tingling in the feet  HPI:  [name] is a 74 y.o. female here as requested by Inocencio Homes, DPM for aching and cold feet, past medical history of distal first metatarsal osteotomy to repair hallux valgus left with screw fixation in the same procedure on the right both in 2019, migraines, hyperthyroidism, I reviewed Dr. Barkley Bruns notes which reported palpable dorsalis pedis and posterior tibialis pulses in both feet, normal temperature gradient from ankle to toes, normal color and turgor, digital capillary refill time normal, subjective paresthesias and tingling, normal sharp and dull sensation is noted, decreased vibratory sensation in both feet, Achilles tendon reflexes diminished, Tinel's sign is negative at the tarsal tunnel. No other information available since podiatry referred we do not have their primary care note, would appreciate it if podiatry defers to primary care to refer to specialists as they deem clinically appropriate.  Started 3 years ago. With husband who provides information. About a year ago she saw her arthritis doctor and she was having problems with her feet and she was told she has arthritis and she saw a foot doctor. It is in the top of the feet and comes up the legs. She wakes early mornings and she has cramps in the feet and sometimes all the legs hurt. She has back pain but no radicular symptoms, she has a lot of cramps, she tries to drink water. No pre-diabetes, no diabetes, no autoimmune disease, no chemotherapy, brother has parkinson's, sister has neuropathy without a diagnosis, she does not know if parents had neuropathy. Numbness and tingling and itching and it comes all the way up to the thighs, inparticular on the top of the right foot. At night both her legs hurt all the way to the thighs not in a  particular dermatome. The legs ache but the feet are more tingling, numb, itchy, cold, Gabapentin helps.   Reviewed notes, labs and imaging from outside physicians, which showed: see above  Review of Systems: Patient complains of symptoms per HPI as well as the following symptoms: pain in feet. Pertinent negatives and positives per HPI. All others negative.   Social History   Socioeconomic History   Marital status: Married    Spouse name: Not on file   Number of children: Not on file   Years of education: Not on file   Highest education level: Not on file  Occupational History   Not on file  Tobacco Use   Smoking status: Former Smoker    Quit date: 1969    Years since quitting: 52.9   Smokeless tobacco: Never Used  Scientific laboratory technician Use: Never used  Substance and Sexual Activity   Alcohol use: No   Drug use: No   Sexual activity: Not on file  Other Topics Concern   Not on file  Social History Narrative   Not on file   Social Determinants of Health   Financial Resource Strain:    Difficulty of Paying Living Expenses: Not on file  Food Insecurity:    Worried About Charity fundraiser in the Last Year: Not on file   [employer] of Food in the Last Year: Not on file  Transportation Needs:    Lack of Transportation (Medical): Not on file   Lack of Transportation (Non-Medical): Not on file  Physical Activity:  Days of Exercise per Week: Not on file   Minutes of Exercise per Session: Not on file  Stress:    Feeling of Stress : Not on file  Social Connections:    Frequency of Communication with Friends and Family: Not on file   Frequency of Social Gatherings with Friends and Family: Not on file   Attends Religious Services: Not on file   Active Member of Clubs or Organizations: Not on file   Attends Archivist Meetings: Not on file   Marital Status: Not on file  Intimate Partner Violence:    Fear of Current or Ex-Partner: Not on  file   Emotionally Abused: Not on file   Physically Abused: Not on file   Sexually Abused: Not on file    Family History  Problem Relation Age of Onset   Heart disease Mother    Stroke Father    Arthritis Sister    Diabetes Brother    Arthritis Brother    Neuropathy Brother    Neuropathy Sister    Diabetes Brother     Past Medical History:  Diagnosis Date   Dysphagia    HYPERTHYROIDISM    LYMPHADENOPATHY    MIGRAINE HEADACHE    Neuromuscular disorder ([facility])    condition where muscle separates from bone left chest, quarter size, pain intermittent    Patient Active Problem List   Diagnosis Date Noted   Polyneuropathy 08/22/2020   S/P shoulder replacement, 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/2019   PVC (premature ventricular contraction) 07/11/2013   Dysphagia    HYPERTHYROIDISM 07/17/2010   MIGRAINE HEADACHE 07/17/2010   LYMPHADENOPATHY 07/17/2010    Past Surgical History:  Procedure Laterality Date   EYE SURGERY Bilateral    Cataract L eye 1-20, R eye 3-20    MOHS SURGERY  2001   REVERSE SHOULDER ARTHROPLASTY Right 03/16/2019   Procedure: REVERSE SHOULDER ARTHROPLASTY;  Surgeon: Netta Cedars, MD;  Location: [facility];  Service: Orthopedics;  Laterality: Right;  interscalene block   TUBAL LIGATION      Current Outpatient Medications  Medication Sig Dispense Refill   acetaminophen (TYLENOL) 500 MG tablet Take 1,000 mg by mouth every 6 (six) hours as needed for moderate pain.     diphenhydrAMINE (BENADRYL) 25 MG tablet Take 25 mg by mouth 2 (two) times daily as needed for allergies.     gabapentin (NEURONTIN) 300 MG capsule      HYDROcodone-acetaminophen (NORCO/VICODIN) 5-325 MG tablet Take 1-2 tablets by mouth every 6 (six) hours as needed for moderate pain or severe pain. 25 tablet 0   levothyroxine (SYNTHROID) 112 MCG tablet Take 112 mcg by mouth daily before breakfast.     LORazepam (ATIVAN) 2 MG tablet Take 2 mg by mouth 4 (four) times daily as needed for  anxiety.     methocarbamol (ROBAXIN) 500 MG tablet Take 1 tablet (500 mg total) by mouth 3 (three) times daily as needed. 40 tablet 1   Multiple Vitamins-Minerals (MULTIVITAMIN WITH MINERALS) tablet Take 1 tablet by mouth daily.     nystatin cream (MYCOSTATIN) Apply 1 application topically 2 (two) times daily as needed for itching.     omeprazole (PRILOSEC) 40 MG capsule Take 40 mg by mouth daily as needed (acid reflux).     polycarbophil (FIBERCON) 625 MG tablet Take 625 mg by mouth daily.     Polyvinyl Alcohol-Povidone (REFRESH OP) Place 1 drop into both eyes daily as needed (dry eyes).     sertraline (ZOLOFT) 50 MG tablet Take 50  mg by mouth daily.     sucralfate (CARAFATE) 1 g tablet Take 1 g by mouth 4 (four) times daily -  with meals and at bedtime.     triamcinolone cream (KENALOG) 0.1 % Apply 1 application topically 2 (two) times daily as needed for itching.     No current facility-administered medications for this visit.    Allergies as of 08/21/2020 - Review Complete 08/21/2020  Allergen Reaction Noted   Aspirin  03/06/2019   Cephalexin  03/06/2019   Imipenem  03/06/2019   Oxycodone  03/06/2019   Statins     Clindamycin/lincomycin Nausea And Vomiting and Rash 03/06/2019   Penicillins Rash    Quinolones Rash     Vitals: BP 127/71    Pulse 60    Ht 5' (1.524 m)    Wt 162 lb (73.5 kg)    BMI 31.64 kg/m  Last Weight:  Wt Readings from Last 1 Encounters:  08/21/20 162 lb (73.5 kg)   Last Height:   Ht Readings from Last 1 Encounters:  08/21/20 5' (1.524 m)     Physical exam: Exam: Gen: NAD, conversant, well nourised, obese, well groomed                     CV: RRR, no MRG. No Carotid Bruits. No peripheral edema, warm, nontender Eyes: Conjunctivae clear without exudates or hemorrhage  Neuro: Detailed Neurologic Exam  Speech:    Speech is normal; fluent and spontaneous with normal comprehension.  Cognition:    The patient is oriented to person,  place, and time;     recent and remote memory intact;     language fluent;     normal attention, concentration,     fund of knowledge Cranial Nerves:    The pupils are equal, round, and reactive to light.attempted fundoscopy, pupils too small.  Visual fields are full to finger confrontation. Extraocular movements are intact. Trigeminal sensation is intact and the muscles of mastication are normal. The face is symmetric. The palate elevates in the midline. Hearing intact. Voice is normal. Shoulder shrug is normal. The tongue has normal motion without fasciculations.   Coordination:    No dysmetria or ataxia.   Gait:    Normal native gait  Motor Observation:    No asymmetry, no atrophy, and no involuntary movements noted. Tone:    Normal muscle tone.    Posture:    Posture is normal. normal erect    Strength:    Strength is V/V in the upper and lower limbs.      Sensation: decreased pp on the right to mid calf, on the left tht mid forefoot,      Reflex Exam:  DTR's: right patellar diminished, 1+ AJs, otherwise 2+       Toes:    The toes are equiv bilaterally.   Clonus:    Clonus is absent.    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   Blood work today: complete serum workup for neuropathy causes EMG/NCS: May be negative in small-fiber neuropathy but hers is a bit asymmetric, will check for other types of neuropathy Consider genetic testing if all is negative  Would appreciate it if podiatry would defer to primary care to refer to neurology. Since Podiatry referred, I have no pcp records.  Would be great to see what pcp has done prior to workup here and can't get those records in advance  since I need patient's signature to get any records.    Orders Placed This Encounter  Procedures   Hemoglobin A1c   Vitamin B6   Sjogren's syndrome antibods(ssa + ssb)   Sedimentation rate   B12 and Folate  Panel   RPR   Hepatitis C antibody   Rheumatoid factor   Heavy metals, blood   Multiple Myeloma Panel (SPEP&IFE w/QIG)   Methylmalonic acid, serum   Vitamin B1   ANA, IFA (with reflex)   Comprehensive metabolic panel   CBC   NCV with EMG(electromyography)   No orders of the defined types were placed in this encounter.   Cc: Leane Platt,  Lorene Dy, MD  Sarina Ill, MD  [facility] [address] [place], [place] [address]  Phone [phone] Fax [phone]

## 2020-08-21 NOTE — Patient Instructions (Addendum)
Blood work today Electromyoneurogram(See below) Consider genetic testing if all is negative   Peripheral Neuropathy Peripheral neuropathy is a type of nerve damage. It affects nerves that carry signals between the spinal cord and the arms, legs, and the rest of the body (peripheral nerves). It does not affect nerves in the spinal cord or brain. In peripheral neuropathy, one nerve or a group of nerves may be damaged. Peripheral neuropathy is a broad category that includes many specific nerve disorders, like diabetic neuropathy, hereditary neuropathy, and carpal tunnel syndrome. What are the causes? This condition may be caused by:  Diabetes. This is the most common cause of peripheral neuropathy.  Nerve injury.  Pressure or stress on a nerve that lasts a long time.  Lack (deficiency) of B vitamins. This can result from alcoholism, poor diet, or a restricted diet.  Infections.  Autoimmune diseases, such as rheumatoid arthritis and systemic lupus erythematosus.  Nerve diseases that are passed from parent to child (inherited).  Some medicines, such as cancer medicines (chemotherapy).  Poisonous (toxic) substances, such as lead and mercury.  Too little blood flowing to the legs.  Kidney disease.  Thyroid disease. In some cases, the cause of this condition is not known. What are the signs or symptoms? Symptoms of this condition depend on which of your nerves is damaged. Common symptoms include:  Loss of feeling (numbness) in the feet, hands, or both.  Tingling in the feet, hands, or both.  Burning pain.  Very sensitive skin.  Weakness.  Not being able to move a part of the body (paralysis).  Muscle twitching.  Clumsiness or poor coordination.  Loss of balance.  Not being able to control your bladder.  Feeling dizzy.  Sexual problems. How is this diagnosed? Diagnosing and finding the cause of peripheral neuropathy can be difficult. Your health care provider will  take your medical history and do a physical exam. A neurological exam will also be done. This involves checking things that are affected by your brain, spinal cord, and nerves (nervous system). For example, your health care provider will check your reflexes, how you move, and what you can feel. You may have other tests, such as:  Blood tests.  Electromyogram (EMG) and nerve conduction tests. These tests check nerve function and how well the nerves are controlling the muscles.  Imaging tests, such as CT scans or MRI to rule out other causes of your symptoms.  Removing a small piece of nerve to be examined in a lab (nerve biopsy). This is rare.  Removing and examining a small amount of the fluid that surrounds the brain and spinal cord (lumbar puncture). This is rare. How is this treated? Treatment for this condition may involve:  Treating the underlying cause of the neuropathy, such as diabetes, kidney disease, or vitamin deficiencies.  Stopping medicines that can cause neuropathy, such as chemotherapy.  Medicine to relieve pain. Medicines may include: ? Prescription or over-the-counter pain medicine. ? Antiseizure medicine. ? Antidepressants. ? Pain-relieving patches that are applied to painful areas of skin.  Surgery to relieve pressure on a nerve or to destroy a nerve that is causing pain.  Physical therapy to help improve movement and balance.  Devices to help you move around (assistive devices). Follow these instructions at home: Medicines  Take over-the-counter and prescription medicines only as told by your health care provider. Do not take any other medicines without first asking your health care provider.  Do not drive or use heavy machinery while taking  prescription pain medicine. Lifestyle   Do not use any products that contain nicotine or tobacco, such as cigarettes and e-cigarettes. Smoking keeps blood from reaching damaged nerves. If you need help quitting, ask  your health care provider.  Avoid or limit alcohol. Too much alcohol can cause a vitamin B deficiency, and vitamin B is needed for healthy nerves.  Eat a healthy diet. This includes: ? Eating foods that are high in fiber, such as fresh fruits and vegetables, whole grains, and beans. ? Limiting foods that are high in fat and processed sugars, such as fried or sweet foods. General instructions   If you have diabetes, work closely with your health care provider to keep your blood sugar under control.  If you have numbness in your feet: ? Check every day for signs of injury or infection. Watch for redness, warmth, and swelling. ? Wear padded socks and comfortable shoes. These help protect your feet.  Develop a good support system. Living with peripheral neuropathy can be stressful. Consider talking with a mental health specialist or joining a support group.  Use assistive devices and attend physical therapy as told by your health care provider. This may include using a walker or a cane.  Keep all follow-up visits as told by your health care provider. This is important. Contact a health care provider if:  You have new signs or symptoms of peripheral neuropathy.  You are struggling emotionally from dealing with peripheral neuropathy.  Your pain is not well-controlled. Get help right away if:  You have an injury or infection that is not healing normally.  You develop new weakness in an arm or leg.  You fall frequently. Summary  Peripheral neuropathy is when the nerves in the arms, or legs are damaged, resulting in numbness, weakness, or pain.  There are many causes of peripheral neuropathy, including diabetes, pinched nerves, vitamin deficiencies, autoimmune disease, and hereditary conditions.  Diagnosing and finding the cause of peripheral neuropathy can be difficult. Your health care provider will take your medical history, do a physical exam, and do tests, including blood tests  and nerve function tests.  Treatment involves treating the underlying cause of the neuropathy and taking medicines to help control pain. Physical therapy and assistive devices may also help. This information is not intended to replace advice given to you by your health care provider. Make sure you discuss any questions you have with your health care provider. Document Revised: 08/19/2017 Document Reviewed: 11/15/2016 Elsevier Patient Education  2020 [employer]   Electromyoneurogram Electromyoneurogram is a test to check how well your muscles and nerves are working. This procedure includes the combined use of electromyogram (EMG) and nerve conduction study (NCS). EMG is used to look for muscular disorders. NCS, which is also called electroneurogram, measures how well your nerves are controlling your muscles. The procedures are usually done together to check if your muscles and nerves are healthy. If the results of the tests are abnormal, this may indicate disease or injury, such as a neuromuscular disease or peripheral nerve damage. Tell a health care provider about:  Any allergies you have.  All medicines you are taking, including vitamins, herbs, eye drops, creams, and over-the-counter medicines.  Any problems you or family members have had with anesthetic medicines.  Any blood disorders you have.  Any surgeries you have had.  Any medical conditions you have.  If you have a pacemaker.  Whether you are pregnant or may be pregnant. What are the risks? Generally, this  is a safe procedure. However, problems may occur, including:  Infection where the electrodes were inserted.  Bleeding. What happens before the procedure? Medicines Ask your health care provider about:  Changing or stopping your regular medicines. This is especially important if you are taking diabetes medicines or blood thinners.  Taking medicines such as aspirin and ibuprofen. These medicines can thin your  blood. Do not take these medicines unless your health care provider tells you to take them.  Taking over-the-counter medicines, vitamins, herbs, and supplements. General instructions  Your health care provider may ask you to avoid: ? Beverages that have caffeine, such as coffee and tea. ? Any products that contain nicotine or tobacco. These products include cigarettes, e-cigarettes, and chewing tobacco. If you need help quitting, ask your health care provider.  Do not use lotions or creams on the same day that you will be having the procedure. What happens during the procedure? For EMG   Your health care provider will ask you to stay in a position so that he or she can access the muscle that will be studied. You may be standing, sitting, or lying down.  You may be given a medicine that numbs the area (local anesthetic).  A very thin needle that has an electrode will be inserted into your muscle.  Another small electrode will be placed on your skin near the muscle.  Your health care provider will ask you to continue to remain still.  The electrodes will send a signal that tells about the electrical activity of your muscles. You may see this on a monitor or hear it in the room.  After your muscles have been studied at rest, your health care provider will ask you to contract or flex your muscles. The electrodes will send a signal that tells about the electrical activity of your muscles.  Your health care provider will remove the electrodes and the electrode needles when the procedure is finished. The procedure may vary among health care providers and hospitals. For NCS   An electrode that records your nerve activity (recording electrode) will be placed on your skin by the muscle that is being studied.  An electrode that is used as a reference (reference electrode) will be placed near the recording electrode.  A paste or gel will be applied to your skin between the recording  electrode and the reference electrode.  Your nerve will be stimulated with a mild shock. Your health care provider will measure how much time it takes for your muscle to react.  Your health care provider will remove the electrodes and the gel when the procedure is finished. The procedure may vary among health care providers and hospitals. What happens after the procedure?  It is up to you to get the results of your procedure. Ask your health care provider, or the department that is doing the procedure, when your results will be ready.  Your health care provider may: ? Give you medicines for any pain. ? Monitor the insertion sites to make sure that bleeding stops. Summary  Electromyoneurogram is a test to check how well your muscles and nerves are working.  If the results of the tests are abnormal, this may indicate disease or injury.  This is a safe procedure. However, problems may occur, such as bleeding and infection.  Your health care provider will do two tests to complete this procedure. One checks your muscles (EMG) and another checks your nerves (NCS).  It is up to you to get  the results of your procedure. Ask your health care provider, or the department that is doing the procedure, when your results will be ready. This information is not intended to replace advice given to you by your health care provider. Make sure you discuss any questions you have with your health care provider. Document Revised: 05/23/2018 Document Reviewed: 05/05/2018 Elsevier Patient Education  2020 [employer].

## 2020-08-22 ENCOUNTER — Encounter: Payer: Self-pay | Admitting: Neurology

## 2020-08-22 DIAGNOSIS — G629 Polyneuropathy, unspecified: Secondary | ICD-10-CM | POA: Insufficient documentation

## 2020-08-26 ENCOUNTER — Other Ambulatory Visit: Payer: Self-pay | Admitting: Neurology

## 2020-08-26 ENCOUNTER — Telehealth: Payer: Self-pay | Admitting: Neurology

## 2020-08-26 ENCOUNTER — Telehealth: Payer: Self-pay | Admitting: *Deleted

## 2020-08-26 MED ORDER — GABAPENTIN 300 MG PO CAPS
ORAL_CAPSULE | ORAL | 6 refills | Status: DC
Start: 2020-08-26 — End: 2020-08-27

## 2020-08-26 NOTE — Telephone Encounter (Signed)
Pt is asking for a call from RN to discuss the gabapentin (NEURONTIN) 300 MG capsule going to [facility], pt wanted a 90 day called into [facility].  Please call pt to discuss

## 2020-08-26 NOTE — Telephone Encounter (Signed)
Spoke with patient and informed her that her bood work is normal so far, still a few pending and if they come back abnormal we will let her know. She wanted to Dr Lucia Gaskins to know that taking gabapentin two at night has helped tremendously. Patient verbalized understanding, appreciation.

## 2020-08-27 MED ORDER — GABAPENTIN 300 MG PO CAPS
ORAL_CAPSULE | ORAL | 1 refills | Status: DC
Start: 2020-08-27 — End: 2021-02-17

## 2020-08-27 NOTE — Telephone Encounter (Signed)
That's fine go ahead thanks 90-day supply with 1 refills

## 2020-08-27 NOTE — Telephone Encounter (Signed)
Spoke with patient and let her know per Dr Lucia Gaskins, will send 3 month supply of Gabapentin w/ 1 refill to [facility]. Pt asked for [facility] to be removed from her chart as all Rx should go to [facility] order unless stated otherwise. I made the change on chart and advised pt that if she needs a prescription to go to [facility] in the future she will have to let us know. Pt verbalized appreciation for the call.   Gabapentin 300 mg rx quantity adjusted to 360 caps with 1 refill and e-scribed to [facility]. I also called [facility] (they were closed) and left a secure VM on provider line to cancel the Gabapentin Rx. Left office number for call back if they have any questions.

## 2020-08-27 NOTE — Addendum Note (Signed)
Addended by: Bertram Savin on: 08/27/2020 08:31 AM   Modules accepted: Orders

## 2020-08-29 LAB — RHEUMATOID FACTOR: Rheumatoid fact SerPl-aCnc: <10 IU/mL (ref <14.0)

## 2020-08-29 LAB — B12 AND FOLATE PANEL
Folate: >20 ng/mL (ref >3.0)
Vitamin B-12: 822 pg/mL (ref 232–1245)

## 2020-08-29 LAB — COMPREHENSIVE METABOLIC PANEL
ALT: 19 IU/L (ref 0–32)
AST: 19 IU/L (ref 0–40)
Albumin/Globulin Ratio: 2 (ref 1.2–2.2)
Albumin: 4.5 g/dL (ref 3.7–4.7)
Alkaline Phosphatase: 61 IU/L (ref 44–121)
BUN/Creatinine Ratio: 16 (ref 12–28)
BUN: 8 mg/dL (ref 8–27)
Bilirubin Total: 0.3 mg/dL (ref 0.0–1.2)
CO2: 22 mmol/L (ref 20–29)
Calcium: 10.1 mg/dL (ref 8.7–10.3)
Chloride: 102 mmol/L (ref 96–106)
Creatinine, Ser: 0.51 mg/dL — ABNORMAL LOW (ref 0.57–1.00)
GFR calc Af Amer: 111 mL/min/{1.73_m2} (ref >59)
GFR calc non Af Amer: 96 mL/min/{1.73_m2} (ref >59)
Globulin, Total: 2.3 g/dL (ref 1.5–4.5)
Glucose: 95 mg/dL (ref 65–99)
Potassium: 4.5 mmol/L (ref 3.5–5.2)
Sodium: 139 mmol/L (ref 134–144)
Total Protein: 6.8 g/dL (ref 6.0–8.5)

## 2020-08-29 LAB — SJOGREN'S SYNDROME ANTIBODS(SSA + SSB)
ENA SSA (RO) Ab: <0.2 AI (ref 0.0–0.9)
ENA SSB (LA) Ab: <0.2 AI (ref 0.0–0.9)

## 2020-08-29 LAB — METHYLMALONIC ACID, SERUM: Methylmalonic Acid: 179 nmol/L (ref 0–378)

## 2020-08-29 LAB — MULTIPLE MYELOMA PANEL, SERUM
Albumin SerPl Elph-Mcnc: 3.8 g/dL (ref 2.9–4.4)
Albumin/Glob SerPl: 1.3 (ref 0.7–1.7)
Alpha 1: 0.3 g/dL (ref 0.0–0.4)
Alpha2 Glob SerPl Elph-Mcnc: 0.8 g/dL (ref 0.4–1.0)
B-Globulin SerPl Elph-Mcnc: 1.2 g/dL (ref 0.7–1.3)
Gamma Glob SerPl Elph-Mcnc: 0.8 g/dL (ref 0.4–1.8)
Globulin, Total: 3 g/dL (ref 2.2–3.9)
IgA/Immunoglobulin A, Serum: 198 mg/dL (ref 64–422)
IgG (Immunoglobin G), Serum: 742 mg/dL (ref 586–1602)
IgM (Immunoglobulin M), Srm: 161 mg/dL (ref 26–217)

## 2020-08-29 LAB — CBC
Hematocrit: 40.6 % (ref 34.0–46.6)
Hemoglobin: 13.8 g/dL (ref 11.1–15.9)
MCH: 32.3 pg (ref 26.6–33.0)
MCHC: 34 g/dL (ref 31.5–35.7)
MCV: 95 fL (ref 79–97)
Platelets: 388 10*3/uL (ref 150–450)
RBC: 4.27 x10E6/uL (ref 3.77–5.28)
RDW: 12 % (ref 11.7–15.4)
WBC: 6.4 10*3/uL (ref 3.4–10.8)

## 2020-08-29 LAB — VITAMIN B6: Vitamin B6: 22.8 ug/L (ref 2.0–32.8)

## 2020-08-29 LAB — HEAVY METALS, BLOOD
Arsenic: <1 ug/L — ABNORMAL LOW (ref 2–23)
Lead, Blood: <1 ug/dL (ref 0–4)
Mercury: <1 ug/L (ref 0.0–14.9)

## 2020-08-29 LAB — RPR: RPR Ser Ql: NONREACTIVE

## 2020-08-29 LAB — HEPATITIS C ANTIBODY: Hep C Virus Ab: <0.1 s/co ratio (ref 0.0–0.9)

## 2020-08-29 LAB — VITAMIN B1: Thiamine: 194.8 nmol/L (ref 66.5–200.0)

## 2020-08-29 LAB — HEMOGLOBIN A1C
Est. average glucose Bld gHb Est-mCnc: 114 mg/dL
Hgb A1c MFr Bld: 5.6 % (ref 4.8–5.6)

## 2020-08-29 LAB — SEDIMENTATION RATE: Sed Rate: 10 mm/hr (ref 0–40)

## 2020-08-29 LAB — ANTINUCLEAR ANTIBODIES, IFA: ANA Titer 1: NEGATIVE

## 2020-09-09 ENCOUNTER — Other Ambulatory Visit: Payer: Self-pay | Admitting: Physician Assistant

## 2020-09-09 ENCOUNTER — Ambulatory Visit
Admission: RE | Admit: 2020-09-09 | Discharge: 2020-09-09 | Disposition: A | Discharge status: Home / Self Care | Payer: Medicare Other | Source: Ambulatory Visit | Attending: Physician Assistant | Admitting: Physician Assistant

## 2020-09-09 DIAGNOSIS — R9389 Abnormal findings on diagnostic imaging of other specified body structures: Secondary | ICD-10-CM

## 2020-09-11 ENCOUNTER — Ambulatory Visit (INDEPENDENT_AMBULATORY_CARE_PROVIDER_SITE_OTHER): Payer: Medicare Other | Admitting: Neurology

## 2020-09-11 ENCOUNTER — Ambulatory Visit: Payer: Medicare Other | Admitting: Neurology

## 2020-09-11 DIAGNOSIS — G609 Hereditary and idiopathic neuropathy, unspecified: Secondary | ICD-10-CM | POA: Diagnosis not present

## 2020-09-11 DIAGNOSIS — Z79899 Other long term (current) drug therapy: Secondary | ICD-10-CM

## 2020-09-11 DIAGNOSIS — G629 Polyneuropathy, unspecified: Secondary | ICD-10-CM | POA: Diagnosis not present

## 2020-09-11 DIAGNOSIS — Z0289 Encounter for other administrative examinations: Secondary | ICD-10-CM

## 2020-09-11 NOTE — Progress Notes (Signed)
74 year old patient with a predominently small fiber neuropathy pattern distally in the feet.   Extensive serum testing included: CBC, CMP, ANA, B1, methylmalonic acid, multiple myeloma panel, heavy metals, rheumatoid factor, hepatitis C, RPR, B12, folate, sed rate, Sjogren's, vitamin B6, hemoglobin A were all normal. We had a discussion about her emg/ncs which showed small-fiber neuropathy. Discussed options, pros and cons of biopsy an dgenetic testing. She would like both.  Discussed. Offered biopsy and genetic testing. Discussed the pros and cons of both. Will set up for both   I spent 20 minutes of face-to-face and non-face-to-face time with patient on the  1. Idiopathic small fiber peripheral neuropathy   2. Long-term use of high-risk medication   3. Polyneuropathy    diagnosis.  This included previsit chart review, lab review, study review, order entry, electronic health record documentation, patient education on the different diagnostic and therapeutic options, counseling and coordination of care, risks and benefits of management, compliance, or risk factor reduction. This does not include time spent on emg/ncs.

## 2020-09-14 DIAGNOSIS — G609 Hereditary and idiopathic neuropathy, unspecified: Secondary | ICD-10-CM | POA: Insufficient documentation

## 2020-09-14 NOTE — Procedures (Signed)
        Full Name: [name] Gender: Female MRN #: [id number] Date of Birth: 02/19/1946    Visit Date: 09/11/2020 08:54 Age: 74 Years Examining Physician: Naydeen Speirs, MD  Referring Physician: Petery, John, DPM, [facility] Primary Care Provider:  Roberts, Ronald, MD  History: 74 year old patient with a clinical presentation of predominently small fiber neuropathy in the feet  Summary: EMG/NCS performed on the right lower extremity. Sympathetic skin response conductions showed no response. All remaining nerves (as indicated in the following tables) were within normal limits.  All muscles (as indicated in the following tables) were within normal limits.      Conclusion: There is electrophysiologic evidence for small-fiber neuropathy.  Kharma Sampsel, M.D.  [facility] [address] [place], [place] [address] Tel: [phone] Fax: [phone]  Verbal informed consent was obtained from the patient, patient was informed of potential risk of procedure, including bruising, bleeding, hematoma formation, infection, muscle weakness, muscle pain, numbness, among others.         MNC    Nerve / Sites Muscle Latency Ref. Amplitude Ref. Rel Amp Segments Distance Velocity Ref. Area    ms ms mV mV %  cm m/s m/s mVms  R Peroneal - EDB     Ankle EDB 4.6 ?6.5 4.1 ?2.0 100 Ankle - EDB 9   14.6     Fib head EDB 10.4  3.1  75 Fib head - Ankle 27 47 ?44 16.1     Pop fossa EDB 12.6  3.1  101 Pop fossa - Fib head 10 44 ?44 13.2         Pop fossa - Ankle      R Tibial - AH     Ankle AH 3.6 ?5.8 5.0 ?4.0 100 Ankle - AH 9   13.8     Pop fossa AH 12.7  3.1  62.6 Pop fossa - Ankle 37 41 ?41 11.8         SSR    Nerve / Sites Latency   s  R Sympathetic - Palm     Palm NR  R Sympathetic - Foot     Foot NR  L Sympathetic - Foot     Foot NR           SNC    Nerve / Sites Rec. Site Peak Lat Ref.  Amp Ref. Segments Distance    ms ms V V  cm  R Sural - Ankle (Calf)      Calf Ankle 3.8 ?4.4 15 ?6 Calf - Ankle 14  R Superficial peroneal - Ankle     Lat leg Ankle 3.3 ?4.4 6 ?6 Lat leg - Ankle 14         F  Wave    Nerve F Lat Ref.   ms ms  R Tibial - AH 45.1 ?56.0       EMG Summary Table    Spontaneous MUAP Recruitment  Muscle IA Fib PSW Fasc Other Amp Dur. Poly Pattern  R. Vastus medialis Normal None None None _______ Normal Normal Normal Normal  R. Tibialis anterior Normal None None None _______ Normal Normal Normal Normal  R. Abductor hallucis Normal None None None _______ Normal Normal Normal Normal  R. Extensor hallucis longus Normal None None None _______ Normal Normal Normal Normal  R. Gastrocnemius (Medial head) Normal None None None _______ Normal Normal Normal Normal      

## 2020-09-14 NOTE — Progress Notes (Signed)
        Full Name: [name] Gender: Female MRN #: [id number] Date of Birth: Feb 04, 1946    Visit Date: 09/11/2020 08:54 Age: 74 Years Examining Physician: Naomie Dean, MD  Referring Physician: Merwyn Katos, DPM, [facility] Primary Care Provider:  Burton Apley, MD  History: 74 year old patient with a clinical presentation of predominently small fiber neuropathy in the feet  Summary: EMG/NCS performed on the right lower extremity. Sympathetic skin response conductions showed no response. All remaining nerves (as indicated in the following tables) were within normal limits.  All muscles (as indicated in the following tables) were within normal limits.      Conclusion: There is electrophysiologic evidence for small-fiber neuropathy.  Naomie Dean, M.D.  [facility] [address] [place], [place] [address] Tel: [phone] Fax: [phone]  Verbal informed consent was obtained from the patient, patient was informed of potential risk of procedure, including bruising, bleeding, hematoma formation, infection, muscle weakness, muscle pain, numbness, among others.         MNC    Nerve / Sites Muscle Latency Ref. Amplitude Ref. Rel Amp Segments Distance Velocity Ref. Area    ms ms mV mV %  cm m/s m/s mVms  R Peroneal - EDB     Ankle EDB 4.6 ?6.5 4.1 ?2.0 100 Ankle - EDB 9   14.6     Fib head EDB 10.4  3.1  75 Fib head - Ankle 27 47 ?44 16.1     Pop fossa EDB 12.6  3.1  101 Pop fossa - Fib head 10 44 ?44 13.2         Pop fossa - Ankle      R Tibial - AH     Ankle AH 3.6 ?5.8 5.0 ?4.0 100 Ankle - AH 9   13.8     Pop fossa AH 12.7  3.1  62.6 Pop fossa - Ankle 37 41 ?41 11.8         SSR    Nerve / Sites Latency   s  R Sympathetic - Palm     Palm NR  R Sympathetic - Foot     Foot NR  L Sympathetic - Foot     Foot NR           SNC    Nerve / Sites Rec. Site Peak Lat Ref.  Amp Ref. Segments Distance    ms ms V V  cm  R Sural - Ankle (Calf)      Calf Ankle 3.8 ?4.4 15 ?6 Calf - Ankle 14  R Superficial peroneal - Ankle     Lat leg Ankle 3.3 ?4.4 6 ?6 Lat leg - Ankle 14         F  Wave    Nerve F Lat Ref.   ms ms  R Tibial - AH 45.1 ?56.0       EMG Summary Table    Spontaneous MUAP Recruitment  Muscle IA Fib PSW Fasc Other Amp Dur. Poly Pattern  R. Vastus medialis Normal None None None _______ Normal Normal Normal Normal  R. Tibialis anterior Normal None None None _______ Normal Normal Normal Normal  R. Abductor hallucis Normal None None None _______ Normal Normal Normal Normal  R. Extensor hallucis longus Normal None None None _______ Normal Normal Normal Normal  R. Gastrocnemius (Medial head) Normal None None None _______ Normal Normal Normal Normal

## 2020-09-14 NOTE — Progress Notes (Signed)
See procedure note.

## 2020-09-16 ENCOUNTER — Ambulatory Visit
Admission: RE | Admit: 2020-09-16 | Discharge: 2020-09-16 | Disposition: A | Discharge status: Home / Self Care | Payer: Medicare Other | Source: Ambulatory Visit | Attending: Gastroenterology | Admitting: Gastroenterology

## 2020-09-16 ENCOUNTER — Other Ambulatory Visit: Payer: Self-pay | Admitting: Gastroenterology

## 2020-09-16 DIAGNOSIS — T189XXA Foreign body of alimentary tract, part unspecified, initial encounter: Secondary | ICD-10-CM

## 2020-09-16 MED ORDER — IOPAMIDOL (ISOVUE-300) INJECTION 61%
100.0000 mL | Freq: Once | INTRAVENOUS | Status: AC | PRN
  Administered 2020-09-16: 100 mL via INTRAVENOUS

## 2020-09-17 ENCOUNTER — Other Ambulatory Visit (HOSPITAL_COMMUNITY)
Admission: RE | Admit: 2020-09-17 | Discharge: 2020-09-17 | Disposition: A | Discharge status: Home / Self Care | Payer: Medicare Other | Source: Ambulatory Visit | Attending: Gastroenterology | Admitting: Gastroenterology

## 2020-09-17 DIAGNOSIS — Z01812 Encounter for preprocedural laboratory examination: Secondary | ICD-10-CM | POA: Diagnosis present

## 2020-09-17 DIAGNOSIS — Z20822 Contact with and (suspected) exposure to covid-19: Secondary | ICD-10-CM | POA: Diagnosis not present

## 2020-09-17 LAB — SARS CORONAVIRUS 2 (TAT 6-24 HRS): SARS Coronavirus 2: NEGATIVE

## 2020-09-18 ENCOUNTER — Ambulatory Visit (HOSPITAL_COMMUNITY): Payer: Medicare Other | Admitting: Anesthesiology

## 2020-09-18 ENCOUNTER — Other Ambulatory Visit: Payer: Self-pay

## 2020-09-18 ENCOUNTER — Ambulatory Visit (HOSPITAL_COMMUNITY)
Admission: RE | Admit: 2020-09-18 | Discharge: 2020-09-18 | Disposition: A | Discharge status: Home / Self Care | Payer: Medicare Other | Attending: Gastroenterology | Admitting: Gastroenterology

## 2020-09-18 ENCOUNTER — Encounter (HOSPITAL_COMMUNITY)
Admission: RE | Disposition: A | Discharge status: Home / Self Care | Payer: Self-pay | Source: Home / Self Care | Attending: Gastroenterology

## 2020-09-18 ENCOUNTER — Encounter (HOSPITAL_COMMUNITY): Payer: Self-pay | Admitting: Gastroenterology

## 2020-09-18 DIAGNOSIS — T182XXA Foreign body in stomach, initial encounter: Secondary | ICD-10-CM | POA: Diagnosis not present

## 2020-09-18 DIAGNOSIS — Z79899 Other long term (current) drug therapy: Secondary | ICD-10-CM | POA: Insufficient documentation

## 2020-09-18 DIAGNOSIS — Z79890 Hormone replacement therapy (postmenopausal): Secondary | ICD-10-CM | POA: Insufficient documentation

## 2020-09-18 DIAGNOSIS — Z881 Allergy status to other antibiotic agents status: Secondary | ICD-10-CM | POA: Insufficient documentation

## 2020-09-18 DIAGNOSIS — X58XXXA Exposure to other specified factors, initial encounter: Secondary | ICD-10-CM | POA: Diagnosis not present

## 2020-09-18 DIAGNOSIS — Z87891 Personal history of nicotine dependence: Secondary | ICD-10-CM | POA: Insufficient documentation

## 2020-09-18 DIAGNOSIS — Z886 Allergy status to analgesic agent status: Secondary | ICD-10-CM | POA: Insufficient documentation

## 2020-09-18 DIAGNOSIS — Z888 Allergy status to other drugs, medicaments and biological substances status: Secondary | ICD-10-CM | POA: Insufficient documentation

## 2020-09-18 DIAGNOSIS — Z885 Allergy status to narcotic agent status: Secondary | ICD-10-CM | POA: Diagnosis not present

## 2020-09-18 DIAGNOSIS — K3189 Other diseases of stomach and duodenum: Secondary | ICD-10-CM | POA: Insufficient documentation

## 2020-09-18 DIAGNOSIS — R933 Abnormal findings on diagnostic imaging of other parts of digestive tract: Secondary | ICD-10-CM | POA: Diagnosis not present

## 2020-09-18 DIAGNOSIS — Z880 Allergy status to penicillin: Secondary | ICD-10-CM | POA: Insufficient documentation

## 2020-09-18 HISTORY — PX: FOREIGN BODY REMOVAL: SHX962

## 2020-09-18 HISTORY — PX: ESOPHAGOGASTRODUODENOSCOPY (EGD) WITH PROPOFOL: SHX5813

## 2020-09-18 HISTORY — PX: BALLOON DILATION: SHX5330

## 2020-09-18 SURGERY — ESOPHAGOGASTRODUODENOSCOPY (EGD) WITH PROPOFOL
Anesthesia: Monitor Anesthesia Care

## 2020-09-18 MED ORDER — MIDAZOLAM HCL 5 MG/5ML IJ SOLN
INTRAMUSCULAR | Status: DC | PRN
  Administered 2020-09-18: 2 mg via INTRAVENOUS

## 2020-09-18 MED ORDER — ONDANSETRON HCL 4 MG/2ML IJ SOLN
INTRAMUSCULAR | Status: DC | PRN
  Administered 2020-09-18: 4 mg via INTRAVENOUS

## 2020-09-18 MED ORDER — PROPOFOL 10 MG/ML IV BOLUS
INTRAVENOUS | Status: DC | PRN
  Administered 2020-09-18: 160 mg via INTRAVENOUS

## 2020-09-18 MED ORDER — PROPOFOL 500 MG/50ML IV EMUL
INTRAVENOUS | Status: AC
  Filled 2020-09-18: qty 150

## 2020-09-18 MED ORDER — LACTATED RINGERS IV SOLN: INTRAVENOUS | Status: DC

## 2020-09-18 MED ORDER — METHYLPREDNISOLONE SODIUM SUCC 125 MG IJ SOLR
40.0000 mg | Freq: Once | INTRAMUSCULAR | Status: AC
  Administered 2020-09-18: 12:00:00 40 mg via INTRAVENOUS
  Filled 2020-09-18: qty 0.64

## 2020-09-18 MED ORDER — SUCCINYLCHOLINE CHLORIDE 20 MG/ML IJ SOLN
INTRAMUSCULAR | Status: DC | PRN
  Administered 2020-09-18: 80 mg via INTRAVENOUS

## 2020-09-18 MED ORDER — LIDOCAINE 2% (20 MG/ML) 5 ML SYRINGE
INTRAMUSCULAR | Status: DC | PRN
  Administered 2020-09-18: 40 mg via INTRAVENOUS

## 2020-09-18 MED ORDER — MIDAZOLAM HCL 2 MG/2ML IJ SOLN
INTRAMUSCULAR | Status: AC
  Filled 2020-09-18: qty 2

## 2020-09-18 SURGICAL SUPPLY — 15 items

## 2020-09-18 NOTE — Anesthesia Postprocedure Evaluation (Signed)
Anesthesia Post Note  Patient: [name]  Procedure(s) Performed: ESOPHAGOGASTRODUODENOSCOPY (EGD) WITH PROPOFOL (N/A ) BALLOON DILATION (N/A ) FOREIGN BODY REMOVAL     Patient location during evaluation: Endoscopy Anesthesia Type: MAC Level of consciousness: awake and alert Pain management: pain level controlled Vital Signs Assessment: post-procedure vital signs reviewed and stable Respiratory status: spontaneous breathing, nonlabored ventilation and respiratory function stable Cardiovascular status: blood pressure returned to baseline and stable Postop Assessment: no apparent nausea or vomiting Anesthetic complications: no   No complications documented.  Last Vitals:  Vitals:   09/18/20 1220 09/18/20 1230  BP: 116/89 (!) 117/53  Pulse: 72 77  Resp: 14 14  Temp:    SpO2: 100% 100%    Last Pain:  Vitals:   09/18/20 1220  TempSrc:   PainSc: 3                  Lidia Collum

## 2020-09-18 NOTE — Anesthesia Procedure Notes (Signed)
Procedure Name: Intubation Performed by: Gean Maidens, CRNA Pre-anesthesia Checklist: Patient identified, Emergency Drugs available, Suction available, Patient being monitored and Timeout performed Patient Re-evaluated:Patient Re-evaluated prior to induction Oxygen Delivery Method: Circle system utilized Preoxygenation: Pre-oxygenation with 100% oxygen Induction Type: IV induction Ventilation: Mask ventilation without difficulty Laryngoscope Size: Mac and 4 Grade View: Grade I Tube type: Oral Tube size: 7.0 mm Number of attempts: 1 Airway Equipment and Method: Stylet Placement Confirmation: ETT inserted through vocal cords under direct vision,  positive ETCO2 and breath sounds checked- equal and bilateral Secured at: 21 cm Tube secured with: Tape Dental Injury: Teeth and Oropharynx as per pre-operative assessment

## 2020-09-18 NOTE — Op Note (Signed)
[facility] Patient Name: [name] Procedure Date: 09/18/2020 MRN: [id number] Attending MD: Kerin Salen , MD Date of Birth: 1946-03-11 CSN: [id number] Age: 74 Admit Type: Inpatient Procedure:                Upper GI endoscopy Indications:              Foreign body in the stomach, Abnormal CT of the GI                            tract Providers:                Kerin Salen, MD, Dwain Sarna, RN, Melany Guernsey, Technician Referring MD:             Burton Apley Medicines:                Monitored Anesthesia Care Complications:            No immediate complications. Estimated blood loss:                            Minimal. Estimated Blood Loss:     Estimated blood loss was minimal. Procedure:                Pre-Anesthesia Assessment:                           - Prior to the procedure, a History and Physical                            was performed, and patient medications and                            allergies were reviewed. The patient's tolerance of                            previous anesthesia was also reviewed. The risks                            and benefits of the procedure and the sedation                            options and risks were discussed with the patient.                            All questions were answered, and informed consent                            was obtained. Prior Anticoagulants: The patient has                            taken no previous anticoagulant or antiplatelet                            agents. ASA  Grade Assessment: III - A patient with                            severe systemic disease. After reviewing the risks                            and benefits, the patient was deemed in                            satisfactory condition to undergo the procedure.                           After obtaining informed consent, the endoscope was                            passed under direct vision. Throughout  the                            procedure, the patient's blood pressure, pulse, and                            oxygen saturations were monitored continuously. The                            GIF-H190 ([id number]) Olympus gastroscope was                            introduced through the mouth, and advanced to the                            second part of duodenum. The [id number]-[id number] ([id number])                            Olympus ultra slim endoscope was introduced through                            the and advanced to the. The upper GI endoscopy was                            accomplished without difficulty. The patient                            tolerated the procedure well. Scope In: Scope Out: Findings:      The examined esophagus was normal.      A medium amount of food (residue) was found in the gastric body.      A deformity was found in the gastric body, scarred mucosa, likely from       prior ulceration.      The cardia and gastric fundus were normal on retroflexion.      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 A TTS dilator was passed       through the scope. Dilation with a 12-13.5-15 mm pyloric balloon dilator       was performed. The dilation site was examined following endoscope  reinsertion and showed moderate mucosal disruption and moderate       improvement in luminal narrowing. Estimated blood loss was minimal.       There was significant looping encountered and despite dilation and       improvement in luminal narrowing, the adult gastroscope could not be       advanced into the duodenum.      I exchanged the scope and used a neonatal/smaller scope to evaluate the       duodenum.      The examined duodenum was normal.      A penny was found in the gastric body. Removal was accomplished with a       basket. There was some resistance noted at the GE junction during       withdrawl with minimal self limited bleeding at the site. Impression:               - Normal  esophagus.                           - A medium amount of food (residue) in the stomach.                           - Acquired deformity in the gastric body.                           - Deformity in the pylorus. Dilated.                           - Normal examined duodenum.                           - A penny was found in the stomach. Removal was                            successful. Moderate Sedation:      Patient did not receive moderate sedation for this procedure, but       instead received monitored anesthesia care. Recommendation:           - Patient has a contact number available for                            emergencies. The signs and symptoms of potential                            delayed complications were discussed with the                            patient. Return to normal activities tomorrow.                            Written discharge instructions were provided to the                            patient.                           - Low fiber  diet and low residue diet.                           - Continue present medications. Procedure Code(s):        --- Professional ---                           [phone], Esophagogastroduodenoscopy, flexible,                            transoral; with removal of foreign body(s)                           43245, Esophagogastroduodenoscopy, flexible,                            transoral; with dilation of gastric/duodenal                            stricture(s) (eg, balloon, bougie) Diagnosis Code(s):        --- Professional ---                           Q91.6XIH, Foreign body in stomach, initial encounter                           K31.89, Other diseases of stomach and duodenum                           R93.3, Abnormal findings on diagnostic imaging of                            other parts of digestive tract CPT copyright 2019 American Medical Association. All rights reserved. The codes documented in this report are preliminary and upon  coder review may  be revised to meet current compliance requirements. Kerin Salen, MD 09/18/2020 11:56:26 AM This report has been signed electronically. Number of Addenda: 0

## 2020-09-18 NOTE — Transfer of Care (Signed)
Immediate Anesthesia Transfer of Care Note  Patient: [name]  Procedure(s) Performed: ESOPHAGOGASTRODUODENOSCOPY (EGD) WITH PROPOFOL (N/A ) BALLOON DILATION (N/A ) FOREIGN BODY REMOVAL  Patient Location: PACU  Anesthesia Type:General  Level of Consciousness: sedated, patient cooperative and responds to stimulation  Airway & Oxygen Therapy: Patient Spontanous Breathing and Patient connected to face mask oxygen  Post-op Assessment: Report given to RN and Post -op Vital signs reviewed and stable  Post vital signs: Reviewed and stable  Last Vitals:  Vitals Value Taken Time  BP    Temp    Pulse 73 09/18/20 1159  Resp 11 09/18/20 1159  SpO2 100 % 09/18/20 1159  Vitals shown include unvalidated device data.  Last Pain:  Vitals:   09/18/20 0948  TempSrc: Oral  PainSc: 0-No pain         Complications: No complications documented.

## 2020-09-18 NOTE — Progress Notes (Signed)
Dr Marca Ancona aware of pts red rash over body, see MAR, med given, ok to dc home

## 2020-09-18 NOTE — H&P (Signed)
[name] is an 74 y.o. female.   Chief Complaint: Foreign body in stomach HPI: 74 year old female, noted to have a round metallic object in the antrum.  This was first noted on an x-ray performed in November, subsequently followed up with a CAT scan which shows the foreign object to be in the stomach. Patient does not have any associated nausea, vomiting, abdominal pain or change in bowel habits. She does not remember ingesting a metallic object. 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  Past Medical History:  Diagnosis Date  . Dysphagia   . HYPERTHYROIDISM   . LYMPHADENOPATHY   . MIGRAINE HEADACHE   . Neuromuscular disorder (HCC)    condition where muscle separates from bone left chest, quarter size, pain intermittent    Past Surgical History:  Procedure Laterality Date  . EYE SURGERY Bilateral    Cataract L eye 1-20, R eye 3-20   . MOHS SURGERY  2001  . REVERSE SHOULDER ARTHROPLASTY Right 03/16/2019   Procedure: REVERSE SHOULDER ARTHROPLASTY;  Surgeon: Beverely Low, MD;  Location: [facility];  Service: Orthopedics;  Laterality: Right;  interscalene block  . TUBAL LIGATION      Family History  Problem Relation Age of Onset  . Heart disease Mother   . Stroke Father   . Arthritis Sister   . Diabetes Brother   . Arthritis Brother   . Neuropathy Brother   . Neuropathy Sister   . Diabetes Brother    Social History:  reports that she quit smoking about 53 years ago. She has never used smokeless tobacco. She reports that she does not drink alcohol and does not use drugs.  Allergies:  Allergies  Allergen Reactions  . Aspirin     Avoid due to stomach issues  . Cephalexin     Stomach cramps  . Imipenem     Warm and tingly all over  . Oxycodone     Hallucinations   . Statins     leg pain, muscle cramps  . Clindamycin/Lincomycin Nausea And Vomiting and Rash  . Penicillins Rash    Did it  involve swelling of the face/tongue/throat, SOB, or low BP? No Did it involve sudden or severe rash/hives, skin peeling, or any reaction on the inside of your mouth or nose? Yes Did you need to seek medical attention at a hospital or doctor's office? No When did it last happen?2011 If all above answers are "NO", may proceed with cephalosporin use.   . Quinolones Rash    Medications Prior to Admission  Medication Sig Dispense Refill  . acetaminophen (TYLENOL) 500 MG tablet Take 1,000 mg by mouth every 6 (six) hours as needed for headache.    . Calcium Carbonate+Vitamin D 600-200 MG-UNIT TABS Take 2 tablets by mouth daily.    . diphenhydrAMINE (BENADRYL) 25 MG tablet Take 25 mg by mouth 2 (two) times daily as needed for allergies.    Marland Kitchen gabapentin (NEURONTIN) 300 MG capsule Take 300mg (1 cap) in the morning, 300mg (1 cap) in the afternoon and 600mg (2 cap) before bed (Patient taking differently: Take 300 mg by mouth See admin instructions. Take 300  mg in the morning, 300 mg in the afternoon and 600mg  before bed) 360 capsule 1  . HYDROcodone-acetaminophen (NORCO/VICODIN) 5-325 MG tablet Take 1-2 tablets by mouth every 6 (six) hours as needed for moderate pain or severe pain. (Patient taking differently: Take 2 tablets by mouth  See admin instructions. Take 2 tabs per week as needed) 25 tablet 0  . levothyroxine (SYNTHROID) 112 MCG tablet Take 112 mcg by mouth daily before breakfast.    . LORazepam (ATIVAN) 2 MG tablet Take 2 mg by mouth 3 (three) times daily as needed for anxiety.    . methocarbamol (ROBAXIN) 500 MG tablet Take 1 tablet (500 mg total) by mouth 3 (three) times daily as needed. (Patient taking differently: Take 500 mg by mouth 2 (two) times daily as needed for muscle spasms.) 40 tablet 1  . Multiple Vitamins-Minerals (MULTIVITAMIN WITH MINERALS) tablet Take 1 tablet by mouth daily.    Marland Kitchen nystatin (MYCOSTATIN/NYSTOP) powder Apply 1 application topically daily as needed for rash.    .  omeprazole (PRILOSEC) 20 MG capsule Take 20 mg by mouth daily as needed (acid reflux).    . polycarbophil (FIBERCON) 625 MG tablet Take 625 mg by mouth daily.    . Polyvinyl Alcohol-Povidone (REFRESH OP) Place 1 drop into both eyes daily as needed (dry eyes).    . sertraline (ZOLOFT) 50 MG tablet Take 50 mg by mouth daily.    . sucralfate (CARAFATE) 1 g tablet Take 1 g by mouth 4 (four) times daily -  with meals and at bedtime.    . triamcinolone cream (KENALOG) 0.1 % Apply 1 application topically 2 (two) times daily as needed for itching.      Results for orders placed or performed during the hospital encounter of 09/17/20 (from the past 48 hour(s))  SARS CORONAVIRUS 2 (TAT 6-24 HRS) Nasopharyngeal Nasopharyngeal Swab     Status: None   Collection Time: 09/17/20  2:00 PM   Specimen: Nasopharyngeal Swab  Result Value Ref Range   SARS Coronavirus 2 NEGATIVE NEGATIVE    Comment: (NOTE) SARS-CoV-2 target nucleic acids are NOT DETECTED.  The SARS-CoV-2 RNA is generally detectable in upper and lower respiratory specimens during the acute phase of infection. Negative results do not preclude SARS-CoV-2 infection, do not rule out co-infections with other pathogens, and should not be used as the sole basis for treatment or other patient management decisions. Negative results must be combined with clinical observations, patient history, and epidemiological information. The expected result is Negative.  Fact Sheet for Patients: HairSlick.no  Fact Sheet for Healthcare Providers: quierodirigir.com  This test is not yet approved or cleared by the [place] FDA and  has been authorized for detection and/or diagnosis of SARS-CoV-2 by FDA under an Emergency Use Authorization (EUA). This EUA will remain  in effect (meaning this test can be used) for the duration of the COVID-19 declaration under Se ction 564(b)(1) of the Act, 21  U.S.C. section 360bbb-3(b)(1), unless the authorization is terminated or revoked sooner.  Performed at [facility], [address] [place], [place] [address]    No results found.  Review of Systems  Constitutional: Negative.   HENT: Negative.   Eyes: Negative.   Respiratory: Negative.   Cardiovascular: Negative.   Gastrointestinal: Negative.   Genitourinary: Negative.   Skin: Positive for rash.    Blood pressure (!) 147/74, pulse 99, temperature 98.6 F (37 C), temperature source Oral, resp. rate 17, height 5\' 6"  (1.676 m), weight 72.6 kg, SpO2 99 %. Physical Exam Constitutional:      Appearance: Normal appearance.  HENT:     Head: Normocephalic and atraumatic.  Eyes:     Conjunctiva/sclera: Conjunctivae normal.  Cardiovascular:     Rate and Rhythm: Normal rate and regular rhythm.  Pulses: Normal pulses.     Heart sounds: Normal heart sounds.  Pulmonary:     Effort: Pulmonary effort is normal.     Breath sounds: Normal breath sounds.  Abdominal:     Palpations: Abdomen is soft.  Musculoskeletal:     Cervical back: Neck supple.  Skin:    Findings: Rash present.  Neurological:     General: No focal deficit present.     Mental Status: She is alert and oriented to person, place, and time.  Psychiatric:        Mood and Affect: Mood normal.        Behavior: Behavior normal.      Assessment/Plan Round metallic foreign body noted in antrum Plan endoscopy for possible retrieval of the object, if the foreign object is movable and retrievable. Discussed about the risks and the benefits of the procedure with the patient in details. She understands and verbalizes consent.  Kerin Salen, MD 09/18/2020, 11:02 AM

## 2020-09-18 NOTE — Progress Notes (Signed)
Debi Mays, RN, called security to see if they could assist pt and husband with non-starting car,  Pt escorted to main lobby by RN, husband awaiting on pt, security not seen at this time, husband to see if security by car, safety maintained, pt remains in chair in main lobby, front desk made aware of situation

## 2020-09-18 NOTE — Progress Notes (Signed)
Husband states car won't start, pt crying, she states she is just not in mood for this, support given, safety maintained

## 2020-09-18 NOTE — Discharge Instructions (Signed)
Upper Endoscopy, Adult, Care After  This sheet gives you information about how to care for yourself after your procedure. Your health care provider may also give you more specific instructions. If you have problems or questions, contact your health care provider.  What can I expect after the procedure?  After the procedure, it is common to have:  · A sore throat.  · Mild stomach pain or discomfort.  · Bloating.  · Nausea.  Follow these instructions at home:    · Follow instructions from your health care provider about what to eat or drink after your procedure.  · Return to your normal activities as told by your health care provider. Ask your health care provider what activities are safe for you.  · Take over-the-counter and prescription medicines only as told by your health care provider.  · Do not drive for 24 hours if you were given a sedative during your procedure.  · Keep all follow-up visits as told by your health care provider. This is important.  Contact a health care provider if you have:  · A sore throat that lasts longer than one day.  · Trouble swallowing.  Get help right away if:  · You vomit blood or your vomit looks like coffee grounds.  · You have:  ? A fever.  ? Bloody, black, or tarry stools.  ? A severe sore throat or you cannot swallow.  ? Difficulty breathing.  ? Severe pain in your chest or abdomen.  Summary  · After the procedure, it is common to have a sore throat, mild stomach discomfort, bloating, and nausea.  · Do not drive for 24 hours if you were given a sedative during the procedure.  · Follow instructions from your health care provider about what to eat or drink after your procedure.  · Return to your normal activities as told by your health care provider.  This information is not intended to replace advice given to you by your health care provider. Make sure you discuss any questions you have with your health care provider.  Document Revised: 02/28/2018 Document Reviewed:  02/06/2018  Elsevier Patient Education © 2020 Elsevier Inc.

## 2020-09-18 NOTE — Anesthesia Preprocedure Evaluation (Addendum)
Anesthesia Evaluation  Patient identified by MRN, date of birth, ID band Patient awake    Reviewed: Allergy & Precautions, NPO status , Patient's Chart, lab work & pertinent test results  History of Anesthesia Complications Negative for: history of anesthetic complications  Airway Mallampati: I  TM Distance: >3 FB Neck ROM: Full    Dental  (+) Teeth Intact   Pulmonary neg pulmonary ROS, former smoker,    Pulmonary exam normal        Cardiovascular negative cardio ROS Normal cardiovascular exam     Neuro/Psych  Headaches, Anxiety    GI/Hepatic negative GI ROS, Neg liver ROS,   Endo/Other  Hypothyroidism   Renal/GU negative Renal ROS  negative genitourinary   Musculoskeletal negative musculoskeletal ROS (+)   Abdominal   Peds  Hematology negative hematology ROS (+)   Anesthesia Other Findings   Reproductive/Obstetrics                           Anesthesia Physical Anesthesia Plan  ASA: II  Anesthesia Plan: General   Post-op Pain Management:    Induction: Intravenous  PONV Risk Score and Plan: 3 and Ondansetron, Dexamethasone, Treatment may vary due to age or medical condition and Midazolam  Airway Management Planned: Oral ETT  Additional Equipment: None  Intra-op Plan:   Post-operative Plan: Extubation in OR  Informed Consent: I have reviewed the patients History and Physical, chart, labs and discussed the procedure including the risks, benefits and alternatives for the proposed anesthesia with the patient or authorized representative who has indicated his/her understanding and acceptance.       Plan Discussed with:   Anesthesia Plan Comments:        Anesthesia Quick Evaluation

## 2020-09-18 NOTE — Brief Op Note (Signed)
09/18/2020  11:56 AM  PATIENT:  [name]  74 y.o. female  PRE-OPERATIVE DIAGNOSIS:  Foreign Body in Stomach  POST-OPERATIVE DIAGNOSIS:  foreign body removal,pylori stenosis dilation  PROCEDURE:  Procedure(s): ESOPHAGOGASTRODUODENOSCOPY (EGD) WITH PROPOFOL (N/A) BALLOON DILATION (N/A) FOREIGN BODY REMOVAL  SURGEON:  Surgeon(s) and Role:    Ronnette Juniper, MD - Primary  PHYSICIAN ASSISTANT:   ASSISTANTS: Mertie Moores Mbumina,Tech  ANESTHESIA:   MAC  EBL: Minimal  BLOOD ADMINISTERED: Minimal  DRAINS: none   LOCAL MEDICATIONS USED:  NONE  SPECIMEN:  No Specimen  DISPOSITION OF SPECIMEN:  N/A  COUNTS:  YES  TOURNIQUET:  * No tourniquets in log *  DICTATION: .Dragon Dictation  PLAN OF CARE: Discharge to home after PACU  PATIENT DISPOSITION:  PACU - hemodynamically stable.   Delay start of Pharmacological VTE agent (>24hrs) due to surgical blood loss or risk of bleeding: not applicable

## 2020-09-19 ENCOUNTER — Encounter (HOSPITAL_COMMUNITY): Payer: Self-pay | Admitting: Gastroenterology

## 2020-09-23 ENCOUNTER — Telehealth: Payer: Self-pay

## 2020-09-23 NOTE — Telephone Encounter (Signed)
I will call the patient. Dr Lucia Gaskins sent a message. We will do genetic testing first.

## 2020-09-23 NOTE — Telephone Encounter (Signed)
[facility] neuropathy genetic testing order form completed, pending MD signature.

## 2020-09-23 NOTE — Telephone Encounter (Signed)
Patient called stating that Dr. Lucia Gaskins said she was going to mail out a genetic test to be done for her to mail in. And also she was suppose to get a biopsy on her foot done? She is calling the check the status on these things.

## 2020-09-23 NOTE — Telephone Encounter (Signed)
From Dr Lucia Gaskins: Can you order [facility] genetic testing please? If that is negative I will ask Dr. Terrace Arabia to do a skin biopsy thanks

## 2020-09-23 NOTE — Telephone Encounter (Signed)
Pt left a VM this morning asking for a call from Dr. Trevor Mace RN. Pt did not leave any additional info.

## 2020-09-23 NOTE — Telephone Encounter (Signed)
Spoke with patient and discussed plan. She is agreeable and verbalized appreciation.

## 2020-09-23 NOTE — Telephone Encounter (Signed)
[facility] genetic testing order form signed by Dr Lucia Gaskins and faxed to [facility] @ [phone]. Received a receipt of confirmation.

## 2020-11-08 ENCOUNTER — Telehealth: Payer: Self-pay | Admitting: Neurology

## 2020-11-08 NOTE — Telephone Encounter (Signed)
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 We will send her a copy of the report and she will need to call [facility] and speak with the genetic counselors.

## 2020-11-10 NOTE — Telephone Encounter (Signed)
Noted, I have a copy to mail to her.

## 2020-11-10 NOTE — Telephone Encounter (Signed)
Called pt and LVM (ok per DPR) advising patient of the results of the genetic testing. I advised we will send her a copy and she should contact [facility] and speak with a Dentist. I asked for a call back to confirm receipt of message and her address first. Left office number in message.

## 2020-11-11 NOTE — Telephone Encounter (Signed)
Pt returned call and confirmed address. Results mailed to patient.

## 2020-11-20 ENCOUNTER — Telehealth: Payer: Self-pay | Admitting: *Deleted

## 2020-11-20 NOTE — Telephone Encounter (Signed)
Spoke with [name], [name] is having some issues with memory and behaviors.  She is attending a support group for dementia patient and families.  She is doing better with her health issues and taking care of herself.  [name] will have some surg in the upcoming days for a nerve in his foot. She know to call for any problems or questions. Ravin Denardo,BSN,RN3,CCM,CN

## 2020-12-19 DIAGNOSIS — D591 Autoimmune hemolytic anemia, unspecified: Secondary | ICD-10-CM

## 2020-12-19 HISTORY — DX: Autoimmune hemolytic anemia, unspecified: D59.10

## 2021-01-02 ENCOUNTER — Inpatient Hospital Stay (HOSPITAL_COMMUNITY)
Admission: EM | Admit: 2021-01-02 | Discharge: 2021-01-12 | DRG: 811 | Disposition: A | Discharge status: Home Health | Payer: Medicare Other | Attending: Internal Medicine | Admitting: Internal Medicine

## 2021-01-02 ENCOUNTER — Emergency Department (HOSPITAL_COMMUNITY): Payer: Medicare Other

## 2021-01-02 ENCOUNTER — Encounter (HOSPITAL_COMMUNITY): Payer: Self-pay

## 2021-01-02 ENCOUNTER — Other Ambulatory Visit: Payer: Self-pay

## 2021-01-02 DIAGNOSIS — Z87891 Personal history of nicotine dependence: Secondary | ICD-10-CM

## 2021-01-02 DIAGNOSIS — I10 Essential (primary) hypertension: Secondary | ICD-10-CM

## 2021-01-02 DIAGNOSIS — E0590 Thyrotoxicosis, unspecified without thyrotoxic crisis or storm: Secondary | ICD-10-CM | POA: Diagnosis present

## 2021-01-02 DIAGNOSIS — D649 Anemia, unspecified: Secondary | ICD-10-CM | POA: Diagnosis present

## 2021-01-02 DIAGNOSIS — D589 Hereditary hemolytic anemia, unspecified: Secondary | ICD-10-CM

## 2021-01-02 DIAGNOSIS — M549 Dorsalgia, unspecified: Secondary | ICD-10-CM | POA: Diagnosis present

## 2021-01-02 DIAGNOSIS — Z823 Family history of stroke: Secondary | ICD-10-CM

## 2021-01-02 DIAGNOSIS — Z79899 Other long term (current) drug therapy: Secondary | ICD-10-CM

## 2021-01-02 DIAGNOSIS — I6330 Cerebral infarction due to thrombosis of unspecified cerebral artery: Secondary | ICD-10-CM | POA: Insufficient documentation

## 2021-01-02 DIAGNOSIS — D539 Nutritional anemia, unspecified: Principal | ICD-10-CM | POA: Diagnosis present

## 2021-01-02 DIAGNOSIS — T8089XA Other complications following infusion, transfusion and therapeutic injection, initial encounter: Secondary | ICD-10-CM | POA: Diagnosis not present

## 2021-01-02 DIAGNOSIS — Z91041 Radiographic dye allergy status: Secondary | ICD-10-CM

## 2021-01-02 DIAGNOSIS — I82432 Acute embolism and thrombosis of left popliteal vein: Secondary | ICD-10-CM | POA: Diagnosis not present

## 2021-01-02 DIAGNOSIS — E039 Hypothyroidism, unspecified: Secondary | ICD-10-CM | POA: Diagnosis not present

## 2021-01-02 DIAGNOSIS — R509 Fever, unspecified: Secondary | ICD-10-CM | POA: Diagnosis not present

## 2021-01-02 DIAGNOSIS — I82451 Acute embolism and thrombosis of right peroneal vein: Secondary | ICD-10-CM | POA: Diagnosis not present

## 2021-01-02 DIAGNOSIS — Z7901 Long term (current) use of anticoagulants: Secondary | ICD-10-CM

## 2021-01-02 DIAGNOSIS — D591 Autoimmune hemolytic anemia, unspecified: Secondary | ICD-10-CM

## 2021-01-02 DIAGNOSIS — Z96611 Presence of right artificial shoulder joint: Secondary | ICD-10-CM | POA: Diagnosis present

## 2021-01-02 DIAGNOSIS — R4701 Aphasia: Secondary | ICD-10-CM | POA: Diagnosis not present

## 2021-01-02 DIAGNOSIS — M419 Scoliosis, unspecified: Secondary | ICD-10-CM | POA: Diagnosis present

## 2021-01-02 DIAGNOSIS — I82402 Acute embolism and thrombosis of unspecified deep veins of left lower extremity: Secondary | ICD-10-CM | POA: Diagnosis not present

## 2021-01-02 DIAGNOSIS — Z888 Allergy status to other drugs, medicaments and biological substances status: Secondary | ICD-10-CM

## 2021-01-02 DIAGNOSIS — R1084 Generalized abdominal pain: Secondary | ICD-10-CM

## 2021-01-02 DIAGNOSIS — Z79890 Hormone replacement therapy (postmenopausal): Secondary | ICD-10-CM

## 2021-01-02 DIAGNOSIS — R29702 NIHSS score 2: Secondary | ICD-10-CM | POA: Diagnosis not present

## 2021-01-02 DIAGNOSIS — I63413 Cerebral infarction due to embolism of bilateral middle cerebral arteries: Secondary | ICD-10-CM

## 2021-01-02 DIAGNOSIS — I6340 Cerebral infarction due to embolism of unspecified cerebral artery: Secondary | ICD-10-CM | POA: Insufficient documentation

## 2021-01-02 DIAGNOSIS — Z9841 Cataract extraction status, right eye: Secondary | ICD-10-CM

## 2021-01-02 DIAGNOSIS — F419 Anxiety disorder, unspecified: Secondary | ICD-10-CM

## 2021-01-02 DIAGNOSIS — Y848 Other medical procedures as the cause of abnormal reaction of the patient, or of later complication, without mention of misadventure at the time of the procedure: Secondary | ICD-10-CM | POA: Diagnosis not present

## 2021-01-02 DIAGNOSIS — Z880 Allergy status to penicillin: Secondary | ICD-10-CM

## 2021-01-02 DIAGNOSIS — R471 Dysarthria and anarthria: Secondary | ICD-10-CM | POA: Diagnosis not present

## 2021-01-02 DIAGNOSIS — H53461 Homonymous bilateral field defects, right side: Secondary | ICD-10-CM | POA: Diagnosis not present

## 2021-01-02 DIAGNOSIS — I82409 Acute embolism and thrombosis of unspecified deep veins of unspecified lower extremity: Secondary | ICD-10-CM

## 2021-01-02 DIAGNOSIS — E785 Hyperlipidemia, unspecified: Secondary | ICD-10-CM | POA: Diagnosis present

## 2021-01-02 DIAGNOSIS — W19XXXA Unspecified fall, initial encounter: Secondary | ICD-10-CM | POA: Diagnosis not present

## 2021-01-02 DIAGNOSIS — Z8249 Family history of ischemic heart disease and other diseases of the circulatory system: Secondary | ICD-10-CM

## 2021-01-02 DIAGNOSIS — Z833 Family history of diabetes mellitus: Secondary | ICD-10-CM

## 2021-01-02 DIAGNOSIS — I2699 Other pulmonary embolism without acute cor pulmonale: Secondary | ICD-10-CM

## 2021-01-02 DIAGNOSIS — R7402 Elevation of levels of lactic acid dehydrogenase (LDH): Secondary | ICD-10-CM | POA: Diagnosis present

## 2021-01-02 DIAGNOSIS — Z20822 Contact with and (suspected) exposure to covid-19: Secondary | ICD-10-CM | POA: Diagnosis present

## 2021-01-02 DIAGNOSIS — Z8261 Family history of arthritis: Secondary | ICD-10-CM

## 2021-01-02 DIAGNOSIS — Z9842 Cataract extraction status, left eye: Secondary | ICD-10-CM

## 2021-01-02 DIAGNOSIS — D72829 Elevated white blood cell count, unspecified: Secondary | ICD-10-CM

## 2021-01-02 DIAGNOSIS — G629 Polyneuropathy, unspecified: Secondary | ICD-10-CM

## 2021-01-02 DIAGNOSIS — D599 Acquired hemolytic anemia, unspecified: Secondary | ICD-10-CM | POA: Diagnosis present

## 2021-01-02 LAB — IRON AND TIBC
Iron: 213 ug/dL — ABNORMAL HIGH (ref 28–170)
Saturation Ratios: 62 % — ABNORMAL HIGH (ref 10.4–31.8)
TIBC: 344 ug/dL (ref 250–450)
UIBC: 131 ug/dL

## 2021-01-02 LAB — URINALYSIS, ROUTINE W REFLEX MICROSCOPIC
Bilirubin Urine: NEGATIVE
Glucose, UA: NEGATIVE mg/dL
Hgb urine dipstick: NEGATIVE
Ketones, ur: NEGATIVE mg/dL
Nitrite: NEGATIVE
Protein, ur: NEGATIVE mg/dL
Specific Gravity, Urine: 1.017 (ref 1.005–1.030)
WBC, UA: >50 WBC/hpf — ABNORMAL HIGH (ref 0–5)
pH: 5 (ref 5.0–8.0)

## 2021-01-02 LAB — RESP PANEL BY RT-PCR (FLU A&B, COVID) ARPGX2
Influenza A by PCR: NEGATIVE
Influenza B by PCR: NEGATIVE
SARS Coronavirus 2 by RT PCR: NEGATIVE

## 2021-01-02 LAB — DIC (DISSEMINATED INTRAVASCULAR COAGULATION)PANEL
D-Dimer, Quant: 1.37 ug/mL-FEU — ABNORMAL HIGH (ref 0.00–0.50)
Fibrinogen: 403 mg/dL (ref 210–475)
INR: 1.1 (ref 0.8–1.2)
Platelets: 451 10*3/uL — ABNORMAL HIGH (ref 150–400)
Prothrombin Time: 14 seconds (ref 11.4–15.2)
Smear Review: NONE SEEN
aPTT: 29 seconds (ref 24–36)

## 2021-01-02 LAB — CBC
HCT: 15.9 % — ABNORMAL LOW (ref 36.0–46.0)
Hemoglobin: 5.7 g/dL — CL (ref 12.0–15.0)
MCH: 39.3 pg — ABNORMAL HIGH (ref 26.0–34.0)
MCHC: 35.8 g/dL (ref 30.0–36.0)
MCV: 109.7 fL — ABNORMAL HIGH (ref 80.0–100.0)
Platelets: 522 10*3/uL — ABNORMAL HIGH (ref 150–400)
RBC: 1.45 MIL/uL — ABNORMAL LOW (ref 3.87–5.11)
WBC: 24.7 10*3/uL — ABNORMAL HIGH (ref 4.0–10.5)
nRBC: 8 % — ABNORMAL HIGH (ref 0.0–0.2)

## 2021-01-02 LAB — LIPASE, BLOOD: Lipase: 37 U/L (ref 11–51)

## 2021-01-02 LAB — VITAMIN B12: Vitamin B-12: 422 pg/mL (ref 180–914)

## 2021-01-02 LAB — DIFFERENTIAL
Abs Immature Granulocytes: 0.74 10*3/uL — ABNORMAL HIGH (ref 0.00–0.07)
Band Neutrophils: 0 %
Basophils Absolute: 0 10*3/uL (ref 0.0–0.1)
Basophils Relative: 0 %
Blasts: 0 %
Eosinophils Absolute: 0 10*3/uL (ref 0.0–0.5)
Eosinophils Relative: 0 %
Lymphocytes Relative: 9 %
Lymphs Abs: 2.2 10*3/uL (ref 0.7–4.0)
Metamyelocytes Relative: 1 %
Monocytes Absolute: 1.2 10*3/uL — ABNORMAL HIGH (ref 0.1–1.0)
Monocytes Relative: 5 %
Myelocytes: 2 %
Neutro Abs: 20.6 10*3/uL — ABNORMAL HIGH (ref 1.7–7.7)
Neutrophils Relative %: 83 %
Other: 0 %
Promyelocytes Relative: 0 %
nRBC: 0 /100 WBC

## 2021-01-02 LAB — FOLATE: Folate: 39 ng/mL (ref >5.9)

## 2021-01-02 LAB — COMPREHENSIVE METABOLIC PANEL
ALT: 25 U/L (ref 0–44)
AST: 33 U/L (ref 15–41)
Albumin: 4.3 g/dL (ref 3.5–5.0)
Alkaline Phosphatase: 60 U/L (ref 38–126)
Anion gap: 12 (ref 5–15)
BUN: 20 mg/dL (ref 8–23)
CO2: 17 mmol/L — ABNORMAL LOW (ref 22–32)
Calcium: 9.3 mg/dL (ref 8.9–10.3)
Chloride: 107 mmol/L (ref 98–111)
Creatinine, Ser: 0.7 mg/dL (ref 0.44–1.00)
GFR, Estimated: >60 mL/min (ref >60)
Glucose, Bld: 156 mg/dL — ABNORMAL HIGH (ref 70–99)
Potassium: 3.5 mmol/L (ref 3.5–5.1)
Sodium: 136 mmol/L (ref 135–145)
Total Bilirubin: 6.5 mg/dL — ABNORMAL HIGH (ref 0.3–1.2)
Total Protein: 7.5 g/dL (ref 6.5–8.1)

## 2021-01-02 LAB — FERRITIN: Ferritin: 1354 ng/mL — ABNORMAL HIGH (ref 11–307)

## 2021-01-02 LAB — RETICULOCYTES
Immature Retic Fract: 44.7 % — ABNORMAL HIGH (ref 2.3–15.9)
RBC.: 1.43 MIL/uL — ABNORMAL LOW (ref 3.87–5.11)
Retic Count, Absolute: 209.9 10*3/uL — ABNORMAL HIGH (ref 19.0–186.0)
Retic Ct Pct: 14.7 % — ABNORMAL HIGH (ref 0.4–3.1)

## 2021-01-02 LAB — BILIRUBIN, DIRECT: Bilirubin, Direct: 0.4 mg/dL — ABNORMAL HIGH (ref 0.0–0.2)

## 2021-01-02 LAB — HEMOGLOBIN AND HEMATOCRIT, BLOOD
HCT: 15.3 % — ABNORMAL LOW (ref 36.0–46.0)
Hemoglobin: 5.3 g/dL — CL (ref 12.0–15.0)

## 2021-01-02 LAB — LACTATE DEHYDROGENASE: LDH: 342 U/L — ABNORMAL HIGH (ref 98–192)

## 2021-01-02 LAB — PREPARE RBC (CROSSMATCH)

## 2021-01-02 LAB — DIRECT ANTIGLOBULIN TEST (NOT AT ARMC)
DAT, IgG: POSITIVE
DAT, complement: NEGATIVE

## 2021-01-02 LAB — LACTIC ACID, PLASMA
Lactic Acid, Venous: 2.5 mmol/L (ref 0.5–1.9)
Lactic Acid, Venous: 3.2 mmol/L (ref 0.5–1.9)

## 2021-01-02 LAB — POC OCCULT BLOOD, ED: Fecal Occult Bld: NEGATIVE

## 2021-01-02 MED ORDER — FLUCONAZOLE 100 MG PO TABS
100.0000 mg | ORAL_TABLET | Freq: Every day | ORAL | Status: DC
  Administered 2021-01-02 – 2021-01-12 (×10): 100 mg via ORAL
  Filled 2021-01-02 (×10): qty 1

## 2021-01-02 MED ORDER — CALCIUM POLYCARBOPHIL 625 MG PO TABS
1250.0000 mg | ORAL_TABLET | Freq: Every day | ORAL | Status: DC
  Administered 2021-01-03 – 2021-01-12 (×10): 1250 mg via ORAL
  Filled 2021-01-02 (×10): qty 2

## 2021-01-02 MED ORDER — GABAPENTIN 300 MG PO CAPS: 300.0000 mg | ORAL_CAPSULE | ORAL | Status: DC

## 2021-01-02 MED ORDER — ADULT MULTIVITAMIN W/MINERALS CH
1.0000 | ORAL_TABLET | Freq: Every day | ORAL | Status: DC
  Administered 2021-01-03 – 2021-01-12 (×9): 1 via ORAL
  Filled 2021-01-02 (×10): qty 1

## 2021-01-02 MED ORDER — FAMCICLOVIR 500 MG PO TABS
500.0000 mg | ORAL_TABLET | Freq: Every day | ORAL | Status: DC
Start: 2021-01-02 — End: 2021-01-12
  Administered 2021-01-02 – 2021-01-12 (×10): 500 mg via ORAL
  Filled 2021-01-02 (×11): qty 1

## 2021-01-02 MED ORDER — LORAZEPAM 1 MG PO TABS
2.0000 mg | ORAL_TABLET | Freq: Three times a day (TID) | ORAL | Status: DC | PRN
  Administered 2021-01-02 – 2021-01-10 (×13): 2 mg via ORAL
  Filled 2021-01-02 (×13): qty 2

## 2021-01-02 MED ORDER — SUCRALFATE 1 G PO TABS
1.0000 g | ORAL_TABLET | Freq: Two times a day (BID) | ORAL | Status: DC
  Administered 2021-01-02 – 2021-01-11 (×19): 1 g via ORAL
  Filled 2021-01-02 (×19): qty 1

## 2021-01-02 MED ORDER — SERTRALINE HCL 50 MG PO TABS
50.0000 mg | ORAL_TABLET | Freq: Every day | ORAL | Status: DC
  Administered 2021-01-03 – 2021-01-12 (×9): 50 mg via ORAL
  Filled 2021-01-02 (×10): qty 1

## 2021-01-02 MED ORDER — GABAPENTIN 300 MG PO CAPS
600.0000 mg | ORAL_CAPSULE | Freq: Every day | ORAL | Status: DC
  Administered 2021-01-02 – 2021-01-11 (×10): 600 mg via ORAL
  Filled 2021-01-02 (×13): qty 2

## 2021-01-02 MED ORDER — FOLIC ACID 1 MG PO TABS
2.0000 mg | ORAL_TABLET | Freq: Every day | ORAL | Status: DC
Start: 2021-01-02 — End: 2021-01-12
  Administered 2021-01-02 – 2021-01-12 (×10): 2 mg via ORAL
  Filled 2021-01-02 (×10): qty 2

## 2021-01-02 MED ORDER — GABAPENTIN 300 MG PO CAPS
300.0000 mg | ORAL_CAPSULE | ORAL | Status: DC
  Administered 2021-01-02 – 2021-01-12 (×19): 300 mg via ORAL
  Filled 2021-01-02 (×16): qty 1

## 2021-01-02 MED ORDER — SODIUM CHLORIDE 0.9 % IV SOLN: 10.0000 mL/h | Freq: Once | INTRAVENOUS | Status: DC

## 2021-01-02 MED ORDER — LACTATED RINGERS IV SOLN: Freq: Once | INTRAVENOUS | Status: AC

## 2021-01-02 MED ORDER — PREDNISONE 20 MG PO TABS
80.0000 mg | ORAL_TABLET | Freq: Every day | ORAL | Status: DC
Start: 2021-01-03 — End: 2021-01-12
  Administered 2021-01-03 – 2021-01-11 (×9): 80 mg via ORAL
  Filled 2021-01-02: qty 1
  Filled 2021-01-02 (×9): qty 4

## 2021-01-02 MED ORDER — HYDROCODONE-ACETAMINOPHEN 5-325 MG PO TABS
1.0000 | ORAL_TABLET | Freq: Four times a day (QID) | ORAL | Status: DC | PRN
Start: 2021-01-02 — End: 2021-01-12
  Administered 2021-01-02: 1 via ORAL
  Administered 2021-01-02: 2 via ORAL
  Administered 2021-01-03: 1 via ORAL
  Administered 2021-01-04: 2 via ORAL
  Administered 2021-01-04 – 2021-01-11 (×9): 1 via ORAL
  Filled 2021-01-02 (×14): qty 1
  Filled 2021-01-02 (×2): qty 2

## 2021-01-02 MED ORDER — LEVOTHYROXINE SODIUM 112 MCG PO TABS
112.0000 ug | ORAL_TABLET | Freq: Every day | ORAL | Status: DC
  Administered 2021-01-03 – 2021-01-11 (×9): 112 ug via ORAL
  Filled 2021-01-02 (×9): qty 1

## 2021-01-02 MED ORDER — PANTOPRAZOLE SODIUM 40 MG PO TBEC
40.0000 mg | DELAYED_RELEASE_TABLET | Freq: Two times a day (BID) | ORAL | Status: DC
Start: 2021-01-02 — End: 2021-01-12
  Administered 2021-01-02 – 2021-01-12 (×19): 40 mg via ORAL
  Filled 2021-01-02 (×19): qty 1

## 2021-01-02 NOTE — Progress Notes (Signed)
Lactic acid 3.2 paged to hospitalist [place].

## 2021-01-02 NOTE — ED Notes (Signed)
Patient attached to external female catheter 

## 2021-01-02 NOTE — ED Notes (Signed)
Family at bedside. 

## 2021-01-02 NOTE — ED Notes (Signed)
Fall Bundle: Yellow socks Yellow fall risk armband Fall risk sign outside of door  Husband in room so bed alarm off

## 2021-01-02 NOTE — Consult Note (Addendum)
[place]  Telephone:([phone]) [phone] Fax:(336) [phone]    [place]  Referring MD:  Tabitha [name]  Reason for Referral: Hemolytic anemia  HPI: Tabitha [name] is [name] 75 year old female with [name] past medical history significant for hypothyroidism, polyneuropathy, migraine headaches.  She presented to the emergency room with abdominal pain and generalized weakness.  Earlier this week she had an episode of feeling weak with lightheadedness.  [name] and due to her symptoms, nursing staff called EMS.  She felt better once they were on scene and decided to go home.  She started feeling well again about 2 days later with weakness and dizziness.  She saw her PCP and had [name] physical exam which was reportedly normal.  She received an injection of an unknown medication.  The patient was not feeling any better and started here "jackhammer noises" throughout the night.  She came to the emergency room today because she was so weak she had assistance in walking.  [name] CBC was obtained which showed [name] WBC of 24.7, hemoglobin 5.7, MCV 109.7, platelets 522,000, ANC 20.6.  Her total bilirubin was 6.5 with [name] direct bilirubin 4.  Stool for occult blood was negative.  2 units PRBCs have been ordered.  Additional lab work has been ordered which is currently pending including [name] pathology review of peripheral blood smear, reticulocytes, LDH, haptoglobin, DAT, ferritin, iron studies, vitamin B12, folate.  The patient was seen in her hospital room.  Her husband is at the bedside.  She reports generalized fatigue and weakness.  She states that she is cold all the time.  She has not had any recent fevers or chills.  No recent viral illnesses.  No recent Covid vaccine or booster.  She reports some mild dizziness and worsening of her migraine headaches over the past week.  She currently denies chest pain but has some mild dyspnea with exertion.  Denies  abdominal pain, nausea, vomiting.  No melena or hematochezia.  No other bleeding noted such as epistaxis, hemoptysis, hematemesis, hematuria.  The patient tells me that she usually walks with her husband but has not been able to walk in the past week due to fatigue.  The patient reports that she received RhoGam around the time of her pregnancies and also required transfusions at that time due to blood loss.  No other transfusions have been required.  Hematology was asked see the patient for recommendations regarding her anemia.  Past Medical History:  Diagnosis Date  . Dysphagia   . HYPERTHYROIDISM   . LYMPHADENOPATHY   . MIGRAINE HEADACHE   . Neuromuscular disorder ([place])    condition where muscle separates from bone left chest, quarter size, pain intermittent  :    Past Surgical History:  Procedure Laterality Date  . BALLOON DILATION N/[name] 09/18/2020   Procedure: BALLOON DILATION;  Surgeon: Ronnette Juniper, MD;  Location: [facility];  Service: Gastroenterology;  Laterality: N/[name];  . ESOPHAGOGASTRODUODENOSCOPY (EGD) WITH PROPOFOL N/[name] 09/18/2020   Procedure: ESOPHAGOGASTRODUODENOSCOPY (EGD) WITH PROPOFOL;  Surgeon: Ronnette Juniper, MD;  Location: [facility];  Service: Gastroenterology;  Laterality: N/[name];  . EYE SURGERY Bilateral    Cataract L eye 1-20, R eye 3-20   . FOREIGN BODY REMOVAL  09/18/2020   Procedure: FOREIGN BODY REMOVAL;  Surgeon: Ronnette Juniper, MD;  Location: [facility];  Service: Gastroenterology;;  . [place]  2001  . REVERSE SHOULDER ARTHROPLASTY Right 03/16/2019   Procedure: REVERSE SHOULDER ARTHROPLASTY;  Surgeon: Netta Cedars, MD;  Location: [facility];  Service: Orthopedics;  Laterality: Right;  interscalene block  . TUBAL LIGATION    :   CURRENT MEDS: Current Facility-Administered Medications  Medication Dose Route Frequency Provider Last Rate Last Admin  . 0.9 %  sodium chloride infusion  10 mL/hr Intravenous Once Tabitha [name] A, PA-C      . lactated ringers infusion    Intravenous Once Tabitha [name], Tabitha A, DO          Allergies  Allergen Reactions  . Aspirin     Avoid due to stomach issues  . Cephalexin     Stomach cramps  . Imipenem     Warm and tingly all over  . Losartan Potassium-Hctz     Other reaction(s): do not remember  . Oxycodone     Hallucinations   . Statins     leg pain, muscle cramps  . Clindamycin/Lincomycin Nausea And Vomiting and Rash  . Contrast Media [Iodinated Diagnostic Agents] Rash  . Penicillins Rash    Did it involve swelling of the face/tongue/throat, SOB, or low BP? No Did it involve sudden or severe rash/hives, skin peeling, or any reaction on the inside of your mouth or nose? Yes Did you need to seek medical attention at [name] hospital or doctor's office? No When did it last happen?2011 If all above answers are "NO", may proceed with cephalosporin use.   . Quinolones Rash  :  Family History  Problem Relation Age of Onset  . Heart disease Mother   . Stroke Father   . Arthritis Sister   . Diabetes Brother   . Arthritis Brother   . Neuropathy Brother   . Neuropathy Sister   . Diabetes Brother   :  Social History   Socioeconomic History  . Marital status: Married    Spouse name: Not on file  . Number of children: Not on file  . Years of education: Not on file  . Highest education level: Not on file  Occupational History  . Not on file  Tobacco Use  . Smoking status: Former Smoker    Quit date: 1969    Years since quitting: 53.3  . Smokeless tobacco: Never Used  Vaping Use  . Vaping Use: Never used  Substance and Sexual Activity  . Alcohol use: No  . Drug use: No  . Sexual activity: Not on file  Other Topics Concern  . Not on file  Social History Narrative  . Not on file   Social Determinants of Health   Financial Resource Strain: Not on file  Food Insecurity: Not on file  Transportation Needs: Not on file  Physical Activity: Not on file  Stress: Not on file  Social Connections: Not  on file  Intimate Partner Violence: Not on file  :  REVIEW OF SYSTEMS:  [name] comprehensive 14 point review of systems was negative except as noted in the HPI.    Exam: Patient Vitals for the past 24 hrs:  BP Temp Temp src Pulse Resp SpO2 Height Weight  01/02/21 1430 (!) 111/58 -- -- 87 18 100 % -- --  01/02/21 1330 (!) 106/52 -- -- 86 (!) 28 95 % -- --  01/02/21 1315 (!) 110/53 -- -- 84 (!) 30 100 % -- --  01/02/21 1300 (!) 108/50 -- -- 84 (!) 22 100 % -- --  01/02/21 1245 (!) 119/56 -- -- 84 18 97 % -- --  01/02/21 1230 (!) 111/52 -- -- 85  20 99 % -- --  01/02/21 1215 (!) 115/52 -- -- 85 (!) 24 99 % -- --  01/02/21 1200 115/60 -- -- 84 20 98 % -- --  01/02/21 1145 122/69 -- -- 90 (!) 25 92 % -- --  01/02/21 1130 118/66 -- -- 87 17 100 % -- --  01/02/21 1115 122/68 -- -- (!) 101 (!) 26 99 % -- --  01/02/21 1100 110/63 -- -- 85 (!) 29 100 % -- --  01/02/21 1035 -- -- -- -- -- -- 5' 5"  (1.651 m) 72.6 kg  01/02/21 1031 119/62 97.6 F (36.4 C) Oral 90 20 99 % -- --    General: Awake and alert, no distress. Eyes: Conjunctiva pallor, mild scleral icterus ENT:  There were no oropharyngeal lesions.   Lymphatics:  Negative cervical, supraclavicular or axillary adenopathy.   Respiratory: lungs were clear bilaterally without wheezing or crackles.   Cardiovascular:  Regular rate and rhythm, S1/S2, without murmur, rub or gallop.  There was no pedal edema.   GI:  abdomen was soft, flat, nontender, nondistended, without organomegaly.   Musculoskeletal: Strength symmetrical in the upper and lower extremities. Skin exam was without ecchymosis, petechiae.   Neuro exam was nonfocal.  Patient was alert and oriented.  Attention was good.   Language was appropriate.  Mood was normal without depression.  Speech was not pressured.  Thought content was not tangential.    LABS:  Lab Results  Component Value Date   WBC 24.7 (H) 01/02/2021   HGB 5.7 (LL) 01/02/2021   HCT 15.9 (L) 01/02/2021   PLT 451  (H) 01/02/2021   GLUCOSE 156 (H) 01/02/2021   ALT 25 01/02/2021   AST 33 01/02/2021   NA 136 01/02/2021   K 3.5 01/02/2021   CL 107 01/02/2021   CREATININE 0.70 01/02/2021   BUN 20 01/02/2021   CO2 17 (L) 01/02/2021   INR 1.1 01/02/2021   HGBA1C 5.6 08/21/2020    CT Abdomen Pelvis Wo Contrast  Result Date: 01/02/2021 CLINICAL DATA:  Acute onset abdominal and back pain 4 days ago. EXAM: CT ABDOMEN AND PELVIS WITHOUT CONTRAST TECHNIQUE: Multidetector CT imaging of the abdomen and pelvis was performed following the standard protocol without IV contrast. COMPARISON:  CT abdomen and pelvis 09/16/2020. FINDINGS: Lower chest: Mild dependent atelectasis. Lung bases otherwise clear. No pleural or pericardial effusion. Hepatobiliary: No focal liver abnormality is seen. No gallstones, gallbladder wall thickening, or biliary dilatation. Pancreas: Unremarkable. No pancreatic ductal dilatation or surrounding inflammatory changes. Spleen: Normal in size without focal abnormality. Adrenals/Urinary Tract: The adrenal glands appear normal. No renal or ureteral stones or hydronephrosis. Small cyst in the right kidney is noted but better seen on the patient's prior CT without IV contrast. Urinary bladder appears normal. Stomach/Bowel: Stomach is within normal limits. Negative for appendicitis. Small appendicolith noted. No evidence of bowel wall thickening, distention, or inflammatory changes. Vascular/Lymphatic: Aortic atherosclerosis. No enlarged abdominal or pelvic lymph nodes. Reproductive: Uterus and bilateral adnexa are unremarkable. Other: None. Musculoskeletal: No acute abnormality. 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 IMPRESSION: Negative for urinary tract stone. No acute abnormality or finding to explain the patient's symptoms. Aortic Atherosclerosis (ICD10-I70.0). Electronically Signed   By: Inge Rise M.D.   On: 01/02/2021 13:25   DG Chest Port 1 View  Result  Date: 01/02/2021 CLINICAL DATA:  Shortness of breath and abdominal pain. EXAM: PORTABLE CHEST 1 VIEW COMPARISON:  04/18/2019 FINDINGS: Lungs are adequately inflated and otherwise  clear. Cardiomediastinal silhouette and remainder of the exam is unchanged. IMPRESSION: No active disease. Electronically Signed   By: Marin [name] M.D.   On: 01/02/2021 12:30     ASSESSMENT AND PLAN:  1.  Hemolytic anemia 2.  Leukocytosis 3.  Polyneuropathy 4.  Hypothyroidism 5.  Anxiety  -Given the anemia with elevated bilirubin (mostly indirect), this is likely hemolytic anemia. -We will obtain additional work-up including reticulocytes, DAT, LDH, haptoglobin, iron studies, ferritin, vitamin B12 level, and folate level. -Recommend PRBC transfusion to keep hemoglobin above 8. -Begin folic acid 1 mg daily in the setting of hemolysis. -The patient will be seen later today by Tabitha [name].  We will consider starting her on steroids such as prednisone 1 mg/kg daily.  Thank you for this referral.  Tabitha Bussing, DNP, AGPCNP-BC, AOCNP  ADDENDUM: I saw and examined Ms. Tabitha [name].  I agree with the above assessment by Tabitha [name].  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  The question is what is triggered this.  She had [name] CT of the abdomen and pelvis when she came in.  This was unremarkable for any lymphadenopathy or splenomegaly.  She has had no recent infection.  On her blood smear, she clearly has hemolysis.  She has marked polychromasia.  She has some nucleated red blood cells.  She has [name] lot of spherocytes.  Her white blood cells appear mostly normal neutrophils.  I do not see any flour type lymphocytes.  I do not see any cleft nuclei.  She has an increase in platelets.  She clearly might also have iron deficiency.  This may account for the high platelet count.  I really believe that we have to get her started on immunosuppressive therapy.  I will start her on  prednisone 80 mg [name] day.  She also needs to be on folic acid.  I will start her on 2 mg [name] day.  I do think [name] bone marrow biopsy is going to be indicated.  I have to make sure she does not have any bone marrow low-grade lymphoma or possibly CLL.  She does need to have antifungal and antiviral prophylaxis.  She also needs GI prophylaxis.  Her reticulocyte count is incredibly high at 15%.  This will be the measure that we will utilize to see how well she is responding to treatment.  Her LDH is elevated at 342.  Her indirect bilirubin is 6.5.  Transfusing her clearly is going to be an issue given that she has [name] antibody.  I cannot find anything on her exam that would suggest an etiology.  This is an incredibly interesting.  She looks like she is in good shape.  We may have to add IVIG.  We ultimately might need to consider Rituxan.  We will definitely follow along closely.  She will need daily CBCs, and reticulocyte count.  I think her bilirubin will also give [place] an idea as to how her hemolysis is improving.  She clearly has [name] strong faith.  We had good fellowship.  I know she will get outstanding care from all the staff of all [facility].   Lattie Haw, MD  John 19:30

## 2021-01-02 NOTE — ED Triage Notes (Addendum)
patient c/o mid abdominal pain that radiates into her back x 4 days. Patient also c/o nausea, but denies emesis and diarrhea. patient also c/o dizziness

## 2021-01-02 NOTE — H&P (Signed)
History and Physical    [name] YQI:[id number] DOB: [birth year] DOA: 01/02/2021  PCP: Burton Apley, MD  Patient coming from: Home  Chief Complaint: weakness  HPI: [name] is a 75 y.o. female with medical history significant of hyperthyroidism, polyneuropathy. Presenting with generalized weakness. She reports that 4 days ago she started feeling weak immediately after visiting a friend in a rehab center. She thought that it was just the amount of heat in the room. She required assistance as she felt faint. She did not pass out. She was ok the next morning. However, she began to feel weak and dizzy again 2 days ago. She saw her PCP. A neuro exam was performed. She was given a shot and sent home. She did not improve. Yesterday she started feeling a pounding/throbbing in her ear. This continued throughout the day. This morning when she woke, she was to weak to walk. She decided to come to the ED. She denies any other aggravating or alleviating factors.     ED Course: Found to be anemic. 2 units pRBCs ordered. CT ab/pelvis was negative. CXR was negative. DIC panel ordered. TR[facility] called for admission.    Review of Systems:  Denies CP, palpitations, dyspnea, N/V/D, hematemesis, hematochezia, vaginal discharge. Review of systems is otherwise negative for all not mentioned in HPI.   PMHx Past Medical History:  Diagnosis Date  . Dysphagia   . HYPERTHYROIDISM   . LYMPHADENOPATHY   . MIGRAINE HEADACHE   . Neuromuscular disorder (HCC)    condition where muscle separates from bone left chest, quarter size, pain intermittent    PSHx Past Surgical History:  Procedure Laterality Date  . BALLOON DILATION N/A 09/18/2020   Procedure: BALLOON DILATION;  Surgeon: Kerin Salen, MD;  Location: [facility];  Service: Gastroenterology;  Laterality: N/A;  . ESOPHAGOGASTRODUODENOSCOPY (EGD) WITH PROPOFOL N/A 09/18/2020   Procedure: ESOPHAGOGASTRODUODENOSCOPY (EGD) WITH PROPOFOL;  Surgeon: Kerin Salen, MD;  Location: [facility];  Service: Gastroenterology;  Laterality: N/A;  . EYE SURGERY Bilateral    Cataract L eye 1-20, R eye 3-20   . FOREIGN BODY REMOVAL  09/18/2020   Procedure: FOREIGN BODY REMOVAL;  Surgeon: Kerin Salen, MD;  Location: [facility];  Service: Gastroenterology;;  . MOHS SURGERY  2001  . REVERSE SHOULDER ARTHROPLASTY Right 03/16/2019   Procedure: REVERSE SHOULDER ARTHROPLASTY;  Surgeon: Beverely Low, MD;  Location: [facility];  Service: Orthopedics;  Laterality: Right;  interscalene block  . TUBAL LIGATION      SocHx  reports that she quit smoking about 53 years ago. She has never used smokeless tobacco. She reports that she does not drink alcohol and does not use drugs.  Allergies  Allergen Reactions  . Aspirin     Avoid due to stomach issues  . Cephalexin     Stomach cramps  . Imipenem     Warm and tingly all over  . Losartan Potassium-Hctz     Other reaction(s): do not remember  . Oxycodone     Hallucinations   . Statins     leg pain, muscle cramps  . Clindamycin/Lincomycin Nausea And Vomiting and Rash  . Contrast Media [Iodinated Diagnostic Agents] Rash  . Penicillins Rash    Did it involve swelling of the face/tongue/throat, SOB, or low BP? No Did it involve sudden or severe rash/hives, skin peeling, or any reaction on the inside of your mouth or nose? Yes Did you need to seek medical attention at a hospital or doctor's office? No  When did it last happen?2011 If all above answers are "NO", may proceed with cephalosporin use.   . Quinolones Rash    FamHx Family History  Problem Relation Age of Onset  . Heart disease Mother   . Stroke Father   . Arthritis Sister   . Diabetes Brother   . Arthritis Brother   . Neuropathy Brother   . Neuropathy Sister   . Diabetes Brother     Prior to Admission medications   Medication Sig Start Date End Date Taking? Authorizing Provider  acetaminophen (TYLENOL) 500 MG tablet Take 1,000 mg by  mouth every 6 (six) hours as needed for headache.   Yes [provider]  Calcium Carbonate+Vitamin D 600-200 MG-UNIT TABS Take 2 tablets by mouth 2 (two) times daily.   Yes [provider]  gabapentin (NEURONTIN) 300 MG capsule Take 300mg (1 cap) in the morning, 300mg (1 cap) in the afternoon and 600mg (2 cap) before bed Patient taking differently: Take 300 mg by mouth See admin instructions. Take 300  mg in the morning, 300 mg in the afternoon and 600mg  before bed 08/27/20  Yes , MD  HYDROcodone-acetaminophen (NORCO/VICODIN) 5-325 MG tablet Take 1-2 tablets by mouth every 6 (six) hours as needed for moderate pain or severe pain. Patient taking differently: Take 2 tablets by mouth every 6 (six) hours as needed (headaches). 03/16/19  Yes , MD  levothyroxine (SYNTHROID) 112 MCG tablet Take 112 mcg by mouth daily before breakfast.   Yes [provider]  LORazepam (ATIVAN) 2 MG tablet Take 2 mg by mouth 3 (three) times daily as needed for anxiety.   Yes [provider]  Menthol, Topical Analgesic, (ICY HOT EX) Apply 1 application topically as needed (back pain).   Yes [provider]  Multiple Vitamins-Minerals (MULTIVITAMIN WITH MINERALS) tablet Take 1 tablet by mouth daily.   Yes [provider]  polycarbophil (FIBERCON) 625 MG tablet Take 1,250 mg by mouth daily.   Yes [provider]  sertraline (ZOLOFT) 50 MG tablet Take 50 mg by mouth daily.   Yes [provider]  sucralfate (CARAFATE) 1 g tablet Take 1 g by mouth 2 (two) times daily.   Yes [provider]    Physical Exam: Vitals:   01/02/21 1245 01/02/21 1300 01/02/21 1315 01/02/21 1330  BP: (!) 119/56 (!) 108/50 (!) 110/53 (!) 106/52  Pulse: 84 84 84 86  Resp: 18 (!) 22 (!) 30 (!) 28  Temp:      TempSrc:      SpO2: 97% 100% 100% 95%  Weight:      Height:        General: 75 y.o. female resting in bed in NAD Eyes: PERRL, icteric  sclera ENMT: Nares patent w/o discharge, orophaynx clear, dentition normal, ears w/o discharge/lesions/ulcers Neck: Supple, trachea midline Cardiovascular: RRR, +S1, S2, no m/g/r, equal pulses throughout Respiratory: CTABL, no w/r/r, normal WOB GI: BS+, NDNT, no masses noted, no organomegaly noted MSK: No e/c/c Skin: No rashes, bruises, ulcerations noted; she is jaundiced Neuro: A&O x 3, no focal deficits Psyc: Appropriate interaction and affect, calm/cooperative  Labs on Admission: I have personally reviewed following labs and imaging studies  CBC: Recent Labs  Lab 01/02/21 1056  WBC 24.7*  HGB 5.7*  HCT 15.9*  MCV 109.7*  PLT 522*   Basic Metabolic Panel: Recent Labs  Lab 01/02/21 1056  NA 136  K 3.5  CL 107  CO2 17*  GLUCOSE 156*  BUN 20  CREATININE 0.70  CALCIUM 9.3   GFR: Estimated Creatinine Clearance: 63.4 mL/min (by C-G formula based on SCr of 0.7 mg/dL). Liver Function Tests: Recent Labs  Lab 01/02/21 1056  AST 33  ALT 25  ALKPHOS 60  BILITOT 6.5*  PROT 7.5  ALBUMIN 4.3   Recent Labs  Lab 01/02/21 1056  LIPASE 37   No results for input(s): AMMONIA in the last 168 hours. Coagulation Profile: No results for input(s): INR, PROTIME in the last 168 hours. Cardiac Enzymes: No results for input(s): CKTOTAL, CKMB, CKMBINDEX, TROPONINI in the last 168 hours. BNP (last 3 results) No results for input(s): PROBNP in the last 8760 hours. HbA1C: No results for input(s): HGBA1C in the last 72 hours. CBG: No results for input(s): GLUCAP in the last 168 hours. Lipid Profile: No results for input(s): CHOL, HDL, LDLCALC, TRIG, CHOLHDL, LDLDIRECT in the last 72 hours. Thyroid Function Tests: No results for input(s): TSH, T4TOTAL, FREET4, T3FREE, THYROIDAB in the last 72 hours. Anemia Panel: No results for input(s): VITAMINB12, FOLATE, FERRITIN, TIBC, IRON, RETICCTPCT in the last 72 hours. Urine analysis:    Component Value Date/Time   COLORURINE YELLOW  06/28/2010 0430   APPEARANCEUR CLEAR 06/28/2010 0430   LABSPEC 1.012 06/28/2010 0430   PHURINE 6.0 06/28/2010 0430   GLUCOSEU NEGATIVE 06/28/2010 0430   HGBUR NEGATIVE 06/28/2010 0430   BILIRUBINUR NEGATIVE 06/28/2010 0430   KETONESUR NEGATIVE 06/28/2010 0430   PROTEINUR NEGATIVE 06/28/2010 0430   UROBILINOGEN 0.2 06/28/2010 0430   NITRITE NEGATIVE 06/28/2010 0430   LEUKOCYTESUR  06/28/2010 0430    NEGATIVE MICROSCOPIC NOT DONE ON URINES WITH NEGATIVE PROTEIN, BLOOD, LEUKOCYTES, NITRITE, OR GLUCOSE <1000 mg/dL.    Radiological Exams on Admission: CT Abdomen Pelvis Wo Contrast  Result Date: 01/02/2021 CLINICAL DATA:  Acute onset abdominal and back pain 4 days ago. EXAM: CT ABDOMEN AND PELVIS WITHOUT CONTRAST TECHNIQUE: Multidetector CT imaging of the abdomen and pelvis was performed following the standard protocol without IV contrast. COMPARISON:  CT abdomen and pelvis 09/16/2020. FINDINGS: Lower chest: Mild dependent atelectasis. Lung bases otherwise clear. No pleural or pericardial effusion. Hepatobiliary: No focal liver abnormality is seen. No gallstones, gallbladder wall thickening, or biliary dilatation. Pancreas: Unremarkable. No pancreatic ductal dilatation or surrounding inflammatory changes. Spleen: Normal in size without focal abnormality. Adrenals/Urinary Tract: The adrenal glands appear normal. No renal or ureteral stones or hydronephrosis. Small cyst in the right kidney is noted but better seen on the patient's prior CT without IV contrast. Urinary bladder appears normal. Stomach/Bowel: Stomach is within normal limits. Negative for appendicitis. Small appendicolith noted. No evidence of bowel wall thickening, distention, or inflammatory changes. Vascular/Lymphatic: Aortic atherosclerosis. No enlarged abdominal or pelvic lymph nodes. Reproductive: Uterus and bilateral adnexa are unremarkable. Other: None. Musculoskeletal: No acute abnormality. 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 IMPRESSION: Negative for urinary tract stone. No acute abnormality or finding to explain the patient's symptoms. Aortic Atherosclerosis (ICD10-I70.0). Electronically Signed   By: Drusilla Kanner M.D.   On: 01/02/2021 13:25   DG Chest Port 1 View  Result Date: 01/02/2021 CLINICAL DATA:  Shortness of breath and abdominal pain. EXAM: PORTABLE CHEST 1 VIEW COMPARISON:  04/18/2019 FINDINGS: Lungs are adequately inflated and otherwise clear. Cardiomediastinal silhouette and remainder of the exam is unchanged. IMPRESSION: No active disease. Electronically Signed   By: Elberta Fortis M.D.   On: 01/02/2021 12:30    EKG: Independently reviewed. Sinus, no st elevation  Assessment/Plan Symptomatic anemia Hyperbilirubinemia     -  place in obs, tele     - she's hemolyzing, but not sure why     - check DIC, LDH, haptoglobin, retic, peripheral smear, coombs     - she's getting 2 units pRBCs     - will consult hematology     - CXR, CT ab/pelvis are negative; not getting good sense of infection here; need UA, hold abx for now  Leukocytosis     - CXR, CT ab/pelvis are negative; not getting good sense of infection here; need UA, hold abx for now     - check lactic acid  Anxiety:      - continue home regimen   Hypothyroidism     - continue synthroid  DVT prophylaxis: SCDs  Code Status: FULL  Family Communication: w/ husband at bedside  Consults called: [facility]; consulted on[facility] (Dr. Myna HidalgoEnnever)   Status is: Observation  The patient remains OBS appropriate and will d/c before 2 midnights.  Dispo: The patient is from: Home              Anticipated d/c is to: Home              Patient currently is not medically stable to d/c.   Difficult to place patient No  Teddy Spikeyrone A Umi Mainor DO [facility]  If 7PM-7AM, please contact night-coverage [email or web address]  01/02/2021, 2:06 PM

## 2021-01-02 NOTE — ED Provider Notes (Signed)
[facility]-EMERGENCY DEPT Provider Note   CSN: [id number] Arrival date & time: 01/02/21  1025     History Chief Complaint  Patient presents with  . Abdominal Pain    [name] is a 75 y.o. female.  75 year old female presents from home with her husband at bedside for generalized weakness and feeling unwell.  Patient states that on Monday, April 11, she went to visit a friend in a nursing home and the room they were in was very hot.  Patient states that the heat does not agree with her and generally gives her migraines.  Upon leaving the room that day, she felt weak, unwell, lightheaded and held onto a pillar outside the facility while her husband went and got the car.  Nursing staff noticed that she was feeling unwell and called EMS who assessed her on scene, she began to feel better and decided to go home and eat and rest which seemed to help.  Patient felt her normal self on Tuesday.  Began feeling unwell again on Wednesday, generally weak, somewhat dizzy.  Patient went to her PCP who examined her and her everything he had checked was normal, gave her an injection of unknown medication, noted that she was pale and if she continued to be pale for 2 weeks we will check labs.  Patient went home to rest, felt like she got no relief from the injection she was given.  Continued to feel unwell Thursday, did not sleep well as she began hearing jackhammer noises throughout the night although her husband did not hear same.  Patient came to the emergency room today as she continued to feel unwell and was so weak she was needing assistance walking.  Denies unilateral weakness or numbness or changes in her speech.  States her mouth is very dry and she feels short of breath.  States that she has a little abdominal discomfort but nothing significant.  Reports pain in her back which she relates to her scoliosis, no recent falls or injuries.  Patient is not anticoagulated, denies changes  in bowel or bladder habits, notes her stools to be soft light brown.  Patient does not take aspirin (allergy).        Past Medical History:  Diagnosis Date  . Dysphagia   . HYPERTHYROIDISM   . LYMPHADENOPATHY   . MIGRAINE HEADACHE   . Neuromuscular disorder (HCC)    condition where muscle separates from bone left chest, quarter size, pain intermittent    Patient Active Problem List   Diagnosis Date Noted  . Symptomatic anemia 01/02/2021  . 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 Polyneuropathy 08/22/2020  . 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 PVC (premature ventricular contraction) 07/11/2013  . Dysphagia   . HYPERTHYROIDISM 07/17/2010  . MIGRAINE HEADACHE 07/17/2010  . LYMPHADENOPATHY 07/17/2010    Past Surgical History:  Procedure Laterality Date  . BALLOON DILATION N/A 09/18/2020   Procedure: BALLOON DILATION;  Surgeon: Kerin SalenKarki, Arya, MD;  [facility];  Service: Gastroenterology;  Laterality: N/A;  . ESOPHAGOGASTRODUODENOSCOPY (EGD) WITH PROPOFOL N/A 09/18/2020   Procedure: ESOPHAGOGASTRODUODENOSCOPY (EGD) WITH PROPOFOL;  Surgeon: Kerin SalenKarki, Arya, MD;  Location: [facility];  Service: Gastroenterology;  Laterality: N/A;  . EYE SURGERY Bilateral    Cataract L eye 1-20, R eye 3-20   . FOREIGN BODY REMOVAL  09/18/2020   Procedure: FOREIGN BODY REMOVAL;  Surgeon: Kerin SalenKarki, Arya, MD;  Location: [facility];  Service: Gastroenterology;;  . MOHS SURGERY  2001  .  REVERSE SHOULDER ARTHROPLASTY Right 03/16/2019   Procedure: REVERSE SHOULDER ARTHROPLASTY;  Surgeon: Beverely Low, MD;  Location: [facility];  Service: Orthopedics;  Laterality: Right;  interscalene block  . TUBAL LIGATION       OB History   No obstetric history on file.     Family History  Problem Relation Age of Onset  . Heart disease Mother   . Stroke Father   . Arthritis Sister   . Diabetes Brother   . Arthritis Brother   . Neuropathy Brother   . Neuropathy Sister   .  Diabetes Brother     Social History   Tobacco Use  . Smoking status: Former Smoker    Quit date: 1969    Years since quitting: 53.3  . Smokeless tobacco: Never Used  Vaping Use  . Vaping Use: Never used  Substance Use Topics  . Alcohol use: No  . Drug use: No    Home Medications Prior to Admission medications   Medication Sig Start Date End Date Taking? Authorizing Provider  acetaminophen (TYLENOL) 500 MG tablet Take 1,000 mg by mouth every 6 (six) hours as needed for headache.   Yes [provider]  Calcium Carbonate+Vitamin D 600-200 MG-UNIT TABS Take 2 tablets by mouth 2 (two) times daily.   Yes [provider]  gabapentin (NEURONTIN) 300 MG capsule Take 300mg (1 cap) in the morning, 300mg (1 cap) in the afternoon and 600mg (2 cap) before bed Patient taking differently: Take 300 mg by mouth See admin instructions. Take 300  mg in the morning, 300 mg in the afternoon and 600mg  before bed 08/27/20  Yes , MD  HYDROcodone-acetaminophen (NORCO/VICODIN) 5-325 MG tablet Take 1-2 tablets by mouth every 6 (six) hours as needed for moderate pain or severe pain. Patient taking differently: Take 2 tablets by mouth every 6 (six) hours as needed (headaches). 03/16/19  Yes , MD  levothyroxine (SYNTHROID) 112 MCG tablet Take 112 mcg by mouth daily before breakfast.   Yes [provider]  LORazepam (ATIVAN) 2 MG tablet Take 2 mg by mouth 3 (three) times daily as needed for anxiety.   Yes [provider]  Menthol, Topical Analgesic, (ICY HOT EX) Apply 1 application topically as needed (back pain).   Yes [provider]  Multiple Vitamins-Minerals (MULTIVITAMIN WITH MINERALS) tablet Take 1 tablet by mouth daily.   Yes [provider]  polycarbophil (FIBERCON) 625 MG tablet Take 1,250 mg by mouth daily.   Yes [provider]  sertraline (ZOLOFT) 50 MG tablet Take 50 mg by mouth daily.   Yes [provider]   sucralfate (CARAFATE) 1 g tablet Take 1 g by mouth 2 (two) times daily.   Yes [provider]    Allergies    Aspirin, Cephalexin, Imipenem, Losartan potassium-hctz, Oxycodone, Statins, Clindamycin/lincomycin, Contrast media [iodinated diagnostic agents], Penicillins, and Quinolones  Review of Systems   Review of Systems  Constitutional: Positive for chills and diaphoresis. Negative for fever.  Eyes: Negative for visual disturbance.  Respiratory: Positive for shortness of breath. Negative for cough and wheezing.   Cardiovascular: Negative for chest pain, palpitations and leg swelling.  Gastrointestinal: Positive for abdominal pain. Negative for blood in stool, constipation, diarrhea, nausea and vomiting.  Genitourinary: Negative for difficulty urinating and dysuria.  Musculoskeletal: Positive for back pain.  Skin: Positive for pallor. Negative for wound.  Allergic/Immunologic: Negative for immunocompromised state.  Neurological: Positive for weakness. Negative for speech difficulty.  Psychiatric/Behavioral: Negative for confusion.  All  other systems reviewed and are negative.   Physical Exam Updated Vital Signs BP (!) 106/52   Pulse 86   Temp 97.6 F (36.4 C) (Oral)   Resp (!) 28   Ht  (1.651 m)   Wt 72.6 kg   SpO2 95%   BMI 26.63 kg/m   Physical Exam Vitals and nursing note reviewed. Exam conducted with a chaperone present.  Constitutional:      General: She is not in acute distress.    Appearance: She is well-developed. She is not diaphoretic.  HENT:     Head: Normocephalic and atraumatic.     Comments: Buccal mucosa and conjunctiva pale Eyes:     Extraocular Movements: Extraocular movements intact.     Pupils: Pupils are equal, round, and reactive to light.  Cardiovascular:     Rate and Rhythm: Normal rate and regular rhythm.     Heart sounds: Normal heart sounds.  Pulmonary:     Effort: Pulmonary effort is normal.     Breath sounds: Normal breath  sounds.  Abdominal:     Palpations: Abdomen is soft.     Tenderness: There is no abdominal tenderness.  Genitourinary:    Rectum: Normal. Guaiac result negative. No tenderness.  Skin:    General: Skin is warm and dry.     Coloration: Skin is pale.  Neurological:     Mental Status: She is alert and oriented to person, place, and time.  Psychiatric:        Behavior: Behavior normal.     ED Results / Procedures / Treatments   Labs (all labs ordered are listed, but only abnormal results are displayed) Labs Reviewed  COMPREHENSIVE METABOLIC PANEL - Abnormal; Notable for the following components:      Result Value   CO2 17 (*)    Glucose, Bld 156 (*)    Total Bilirubin 6.5 (*)    All other components within normal limits  CBC - Abnormal; Notable for the following components:   WBC 24.7 (*)    RBC 1.45 (*)    Hemoglobin 5.7 (*)    HCT 15.9 (*)    MCV 109.7 (*)    MCH 39.3 (*)    Platelets 522 (*)    nRBC 8.0 (*)    All other components within normal limits  BILIRUBIN, DIRECT - Abnormal; Notable for the following components:   Bilirubin, Direct 0.4 (*)    All other components within normal limits  RESP PANEL BY RT-PCR (FLU A&B, COVID) ARPGX2  LIPASE, BLOOD  URINALYSIS, ROUTINE W REFLEX MICROSCOPIC  DIC (DISSEMINATED INTRAVASCULAR COAGULATION)PANEL  DIFFERENTIAL  POC OCCULT BLOOD, ED  PREPARE RBC (CROSSMATCH)  TYPE AND SCREEN    EKG EKG Interpretation  Date/Time:  Friday January 02 2021 11:57:57 EDT Ventricular Rate:  93 PR Interval:  142 QRS Duration: 89 QT Interval:  359 QTC Calculation: 447 R Axis:   1 Text Interpretation: Sinus rhythm Ventricular premature complex Low voltage, precordial leads Abnormal R-wave progression, early transition Borderline T abnormalities, anterior leads No significant change since last tracing Confirmed by Vanetta Mulders [phone]) on 01/02/2021 12:10:01 PM   Radiology CT Abdomen Pelvis Wo Contrast  Result Date: 01/02/2021 CLINICAL  DATA:  Acute onset abdominal and back pain 4 days ago. EXAM: CT ABDOMEN AND PELVIS WITHOUT CONTRAST TECHNIQUE: Multidetector CT imaging of the abdomen and pelvis was performed following the standard protocol without IV contrast. COMPARISON:  CT abdomen and pelvis 09/16/2020. FINDINGS: Lower chest: Mild dependent atelectasis. Lung  bases otherwise clear. No pleural or pericardial effusion. Hepatobiliary: No focal liver abnormality is seen. No gallstones, gallbladder wall thickening, or biliary dilatation. Pancreas: Unremarkable. No pancreatic ductal dilatation or surrounding inflammatory changes. Spleen: Normal in size without focal abnormality. Adrenals/Urinary Tract: The adrenal glands appear normal. No renal or ureteral stones or hydronephrosis. Small cyst in the right kidney is noted but better seen on the patient's prior CT without IV contrast. Urinary bladder appears normal. Stomach/Bowel: Stomach is within normal limits. Negative for appendicitis. Small appendicolith noted. No evidence of bowel wall thickening, distention, or inflammatory changes. Vascular/Lymphatic: Aortic atherosclerosis. No enlarged abdominal or pelvic lymph nodes. Reproductive: Uterus and bilateral adnexa are unremarkable. Other: None. Musculoskeletal: No acute abnormality. 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 IMPRESSION: Negative for urinary tract stone. No acute abnormality or finding to explain the patient's symptoms. Aortic Atherosclerosis (ICD10-I70.0). Electronically Signed   By: Drusilla Kanner M.D.   On: 01/02/2021 13:25   DG Chest Port 1 View  Result Date: 01/02/2021 CLINICAL DATA:  Shortness of breath and abdominal pain. EXAM: PORTABLE CHEST 1 VIEW COMPARISON:  04/18/2019 FINDINGS: Lungs are adequately inflated and otherwise clear. Cardiomediastinal silhouette and remainder of the exam is unchanged. IMPRESSION: No active disease. Electronically Signed   By: Elberta Fortis M.D.   On:  01/02/2021 12:30    Procedures .Critical Care Performed by: Jeannie Fend, PA-C Authorized by: Jeannie Fend, PA-C   Critical care provider statement:    Critical care time (minutes):  45   Critical care was time spent personally by me on the following activities:  Discussions with consultants, evaluation of patient's response to treatment, examination of patient, ordering and performing treatments and interventions, ordering and review of laboratory studies, ordering and review of radiographic studies, pulse oximetry, re-evaluation of patient's condition, obtaining history from patient or surrogate and review of old charts     Medications Ordered in ED Medications  0.9 %  sodium chloride infusion (has no administration in time range)    ED Course  I have reviewed the triage vital signs and the nursing notes.  Pertinent labs & imaging results that were available during my care of the patient were reviewed by me and considered in my medical decision making (see chart for details).  Clinical Course as of 01/02/21 1417  Fri Jan 02, 2021  9430 75 year old female presents the emergency room with feeling generally weak and unwell this week.  On exam, she is mildly tachypneic, placed on [facility], does state that she has been short of breath.  Denies changes in stools, reports having little bit of abdominal pain but reports this to be nothing significant, also reporting some left back pain which she associates with her scoliosis, no falls or injuries.  She is pale, her Hemoccult is negative, stool soft and brown. CBC with hemoglobin of 5.7, patient is agreeable with transfusion, transfusion ordered with plan for admission. White blood cell count is elevated at 24.7 (differential added).  CMP with bilirubin of 6.5, LFTs normal, lipase within normal limits.  [LM]  1359 Discussed with Dr. Ronaldo Miyamoto with [facility] hospitalist service will consult for admission. [LM]    Clinical Course User Index [LM]  Alden Hipp   MDM Rules/Calculators/A&P                          Final Clinical Impression(s) / ED Diagnoses Final diagnoses:  Symptomatic anemia  Generalized abdominal pain  Leukocytosis, unspecified type  Hyperbilirubinemia    Rx / DC Orders ED Discharge Orders    None       Jeannie Fend, PA-C 01/02/21 1417    Vanetta Mulders, MD 01/02/21 [phone]

## 2021-01-02 NOTE — Progress Notes (Signed)
Pt had critical lab values Latic Acid 2.5, Hemoglobin- 5.3. Dr. Myna Hidalgo and Dr. Ronaldo Miyamoto notified. Additional lab work ordered.

## 2021-01-03 DIAGNOSIS — D591 Autoimmune hemolytic anemia, unspecified: Secondary | ICD-10-CM | POA: Diagnosis not present

## 2021-01-03 DIAGNOSIS — I63413 Cerebral infarction due to embolism of bilateral middle cerebral arteries: Secondary | ICD-10-CM | POA: Diagnosis not present

## 2021-01-03 DIAGNOSIS — Z823 Family history of stroke: Secondary | ICD-10-CM | POA: Diagnosis not present

## 2021-01-03 DIAGNOSIS — R4701 Aphasia: Secondary | ICD-10-CM | POA: Diagnosis not present

## 2021-01-03 DIAGNOSIS — D72829 Elevated white blood cell count, unspecified: Secondary | ICD-10-CM | POA: Diagnosis not present

## 2021-01-03 DIAGNOSIS — Z79899 Other long term (current) drug therapy: Secondary | ICD-10-CM | POA: Diagnosis not present

## 2021-01-03 DIAGNOSIS — D589 Hereditary hemolytic anemia, unspecified: Secondary | ICD-10-CM | POA: Diagnosis present

## 2021-01-03 DIAGNOSIS — M419 Scoliosis, unspecified: Secondary | ICD-10-CM | POA: Diagnosis present

## 2021-01-03 DIAGNOSIS — Z9841 Cataract extraction status, right eye: Secondary | ICD-10-CM | POA: Diagnosis not present

## 2021-01-03 DIAGNOSIS — Z9842 Cataract extraction status, left eye: Secondary | ICD-10-CM | POA: Diagnosis not present

## 2021-01-03 DIAGNOSIS — I6340 Cerebral infarction due to embolism of unspecified cerebral artery: Secondary | ICD-10-CM | POA: Diagnosis not present

## 2021-01-03 DIAGNOSIS — I639 Cerebral infarction, unspecified: Secondary | ICD-10-CM | POA: Diagnosis not present

## 2021-01-03 DIAGNOSIS — Z888 Allergy status to other drugs, medicaments and biological substances status: Secondary | ICD-10-CM | POA: Diagnosis not present

## 2021-01-03 DIAGNOSIS — I2699 Other pulmonary embolism without acute cor pulmonale: Secondary | ICD-10-CM | POA: Diagnosis not present

## 2021-01-03 DIAGNOSIS — I82451 Acute embolism and thrombosis of right peroneal vein: Secondary | ICD-10-CM | POA: Diagnosis not present

## 2021-01-03 DIAGNOSIS — Z79890 Hormone replacement therapy (postmenopausal): Secondary | ICD-10-CM | POA: Diagnosis not present

## 2021-01-03 DIAGNOSIS — E0590 Thyrotoxicosis, unspecified without thyrotoxic crisis or storm: Secondary | ICD-10-CM | POA: Diagnosis present

## 2021-01-03 DIAGNOSIS — Z8249 Family history of ischemic heart disease and other diseases of the circulatory system: Secondary | ICD-10-CM | POA: Diagnosis not present

## 2021-01-03 DIAGNOSIS — D539 Nutritional anemia, unspecified: Secondary | ICD-10-CM | POA: Diagnosis present

## 2021-01-03 DIAGNOSIS — E039 Hypothyroidism, unspecified: Secondary | ICD-10-CM | POA: Diagnosis present

## 2021-01-03 DIAGNOSIS — I82402 Acute embolism and thrombosis of unspecified deep veins of left lower extremity: Secondary | ICD-10-CM | POA: Diagnosis not present

## 2021-01-03 DIAGNOSIS — Z87891 Personal history of nicotine dependence: Secondary | ICD-10-CM | POA: Diagnosis not present

## 2021-01-03 DIAGNOSIS — W19XXXA Unspecified fall, initial encounter: Secondary | ICD-10-CM | POA: Diagnosis not present

## 2021-01-03 DIAGNOSIS — Z8261 Family history of arthritis: Secondary | ICD-10-CM | POA: Diagnosis not present

## 2021-01-03 DIAGNOSIS — D649 Anemia, unspecified: Secondary | ICD-10-CM | POA: Diagnosis not present

## 2021-01-03 DIAGNOSIS — Y848 Other medical procedures as the cause of abnormal reaction of the patient, or of later complication, without mention of misadventure at the time of the procedure: Secondary | ICD-10-CM | POA: Diagnosis not present

## 2021-01-03 DIAGNOSIS — Z20822 Contact with and (suspected) exposure to covid-19: Secondary | ICD-10-CM | POA: Diagnosis present

## 2021-01-03 DIAGNOSIS — I6389 Other cerebral infarction: Secondary | ICD-10-CM | POA: Diagnosis not present

## 2021-01-03 DIAGNOSIS — Z91041 Radiographic dye allergy status: Secondary | ICD-10-CM | POA: Diagnosis not present

## 2021-01-03 DIAGNOSIS — Z880 Allergy status to penicillin: Secondary | ICD-10-CM | POA: Diagnosis not present

## 2021-01-03 DIAGNOSIS — Z833 Family history of diabetes mellitus: Secondary | ICD-10-CM | POA: Diagnosis not present

## 2021-01-03 DIAGNOSIS — M549 Dorsalgia, unspecified: Secondary | ICD-10-CM | POA: Diagnosis present

## 2021-01-03 DIAGNOSIS — D599 Acquired hemolytic anemia, unspecified: Secondary | ICD-10-CM | POA: Diagnosis present

## 2021-01-03 DIAGNOSIS — I82432 Acute embolism and thrombosis of left popliteal vein: Secondary | ICD-10-CM | POA: Diagnosis not present

## 2021-01-03 DIAGNOSIS — Z96611 Presence of right artificial shoulder joint: Secondary | ICD-10-CM | POA: Diagnosis present

## 2021-01-03 LAB — CBC
HCT: 12.9 % — ABNORMAL LOW (ref 36.0–46.0)
Hemoglobin: 4.3 g/dL — CL (ref 12.0–15.0)
MCH: 37.4 pg — ABNORMAL HIGH (ref 26.0–34.0)
MCHC: 33.3 g/dL (ref 30.0–36.0)
MCV: 112.2 fL — ABNORMAL HIGH (ref 80.0–100.0)
Platelets: 383 10*3/uL (ref 150–400)
RBC: 1.15 MIL/uL — ABNORMAL LOW (ref 3.87–5.11)
WBC: 15.8 10*3/uL — ABNORMAL HIGH (ref 4.0–10.5)
nRBC: 7.6 % — ABNORMAL HIGH (ref 0.0–0.2)

## 2021-01-03 LAB — COMPREHENSIVE METABOLIC PANEL
ALT: 20 U/L (ref 0–44)
AST: 29 U/L (ref 15–41)
Albumin: 3.4 g/dL — ABNORMAL LOW (ref 3.5–5.0)
Alkaline Phosphatase: 46 U/L (ref 38–126)
Anion gap: 7 (ref 5–15)
BUN: 18 mg/dL (ref 8–23)
CO2: 21 mmol/L — ABNORMAL LOW (ref 22–32)
Calcium: 8.5 mg/dL — ABNORMAL LOW (ref 8.9–10.3)
Chloride: 109 mmol/L (ref 98–111)
Creatinine, Ser: 0.56 mg/dL (ref 0.44–1.00)
GFR, Estimated: >60 mL/min (ref >60)
Glucose, Bld: 107 mg/dL — ABNORMAL HIGH (ref 70–99)
Potassium: 4 mmol/L (ref 3.5–5.1)
Sodium: 137 mmol/L (ref 135–145)
Total Bilirubin: 5.3 mg/dL — ABNORMAL HIGH (ref 0.3–1.2)
Total Protein: 5.8 g/dL — ABNORMAL LOW (ref 6.5–8.1)

## 2021-01-03 LAB — RETICULOCYTES
Immature Retic Fract: 53.4 % — ABNORMAL HIGH (ref 2.3–15.9)
RBC.: 1.14 MIL/uL — ABNORMAL LOW (ref 3.87–5.11)
Retic Count, Absolute: 195.2 10*3/uL — ABNORMAL HIGH (ref 19.0–186.0)
Retic Ct Pct: 17.1 % — ABNORMAL HIGH (ref 0.4–3.1)

## 2021-01-03 LAB — HAPTOGLOBIN: Haptoglobin: <10 mg/dL — ABNORMAL LOW (ref 42–346)

## 2021-01-03 LAB — LACTATE DEHYDROGENASE: LDH: 331 U/L — ABNORMAL HIGH (ref 98–192)

## 2021-01-03 LAB — MRSA PCR SCREENING: MRSA by PCR: NEGATIVE

## 2021-01-03 MED ORDER — CHLORHEXIDINE GLUCONATE CLOTH 2 % EX PADS
6.0000 | MEDICATED_PAD | Freq: Every day | CUTANEOUS | Status: DC
  Administered 2021-01-03 – 2021-01-12 (×8): 6 via TOPICAL

## 2021-01-03 MED ORDER — ACETAMINOPHEN 325 MG PO TABS
650.0000 mg | ORAL_TABLET | Freq: Once | ORAL | Status: AC
  Administered 2021-01-03: 650 mg via ORAL
  Filled 2021-01-03: qty 2

## 2021-01-03 MED ORDER — FAMOTIDINE IN NACL 20-0.9 MG/50ML-% IV SOLN
20.0000 mg | Freq: Once | INTRAVENOUS | Status: AC
  Administered 2021-01-03: 20 mg via INTRAVENOUS
  Filled 2021-01-03: qty 50

## 2021-01-03 MED ORDER — SODIUM CHLORIDE 0.9 % IV SOLN: INTRAVENOUS | Status: AC

## 2021-01-03 MED ORDER — ORAL CARE MOUTH RINSE
15.0000 mL | Freq: Two times a day (BID) | OROMUCOSAL | Status: DC
  Administered 2021-01-03 – 2021-01-12 (×15): 15 mL via OROMUCOSAL

## 2021-01-03 MED ORDER — DIPHENHYDRAMINE HCL 50 MG/ML IJ SOLN
12.5000 mg | Freq: Once | INTRAMUSCULAR | Status: AC
  Administered 2021-01-03: 12.5 mg via INTRAVENOUS
  Filled 2021-01-03: qty 1

## 2021-01-03 NOTE — Progress Notes (Addendum)
Upon chart review 09/16/20 patient had a 19 mm foreign body removed from antrum of stomach. Hx of gastric/duodenal ulcer and mild pyloric stenosis. Making a note as may be relevant to current admission. Per pt it was a penny!

## 2021-01-03 NOTE — Progress Notes (Signed)
   01/03/21 1420  Vitals  Vital Signs Type (Include Temp, Pulse, RR, and B/P)  (checking vitals before starting blood back.)  Temp (!) 101.1 F (38.4 C)  Temp Source Oral  Pulse Rate (!) 104  ECG Heart Rate (!) 104  Resp 18  BP 114/67  Oxygen Therapy  SpO2 94 %  O2 Device Nasal Cannula  O2 Flow Rate (L/min) 4 L/min  Given pre-transfusion medications per orders. Fever of 101.1 noted. Patient endorses feeling fine. Reached out to oncology MD Moishe Spice, per MD Goshner, hold on off on starting blood back until 1440. If fever has not broken, she does not recommend restarting blood transfusion back. Made MD Edward Jolly aware via secure method/aminon paging system/ face to face.   - Rechecked temperature at 1440. Temperature noted to be 100.9 orally. Per Made MD Gosher aware. Per MD Gosher, "do not restart blood unless temperature falls underneath 100, recheck temperature again at 1515." Will continue to monitor patient.

## 2021-01-03 NOTE — Progress Notes (Addendum)
-  0825- Crying spell noted. Consoled and explained to patient to take deep breaths. Given 2MG  of Ativan. Patient noted to be sleeping now at 11:17. Also called and checked on blood, per lab blood still not available at this time. Patient is resting on 2-L of nasal cannula. MD aware of delayed blood products.   -0947-Complaint of headache 9/10. Made ND Edward Jolly aware of complaint. Patient received one tablet of  Norco this morning, due to blood pressure being soft, per night shift nurse. No other needs identified. Will continue to monitor.

## 2021-01-03 NOTE — Consult Note (Addendum)
Chief Complaint: Patient was seen in consultation today for CT guided bone marrow biopsy Chief Complaint  Patient presents with  . Abdominal Pain    Referring Physician(s): Ennever,P  Supervising Physician: Arne Cleveland  Patient Status: [facility] - In-pt  History of Present Illness: [name] is a 75 y.o. female with past medical history of hypothyroidism, migraine headaches, and polyneuropathy.  She was admitted to [facility] on 4/15 with generalized weakness, recent abdominal and back pain, nausea, some dyspnea with exertion, mild temperature elevation ,presyncope-dizziness, pounding/ throbbing in her ear.  She was found to be anemic and 2 units of red blood cells were given.  CT abdomen pelvis was negative.  Chest x-ray was negative.  COVID-19 negative.  Current temperature 100.9.  She is tachycardic.  Current labs include WBC 15.8, hemoglobin 4.3, platelets 383k, creatinine 0.56, total bilirubin 5.3.  Elevated reticulocyte count.  LDH 331.  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  Request now received from oncology for CT-guided bone marrow biopsy for further evaluation, rule out lymphoma/leukemia.  Past Medical History:  Diagnosis Date  . Dysphagia   . HYPERTHYROIDISM   . LYMPHADENOPATHY   . MIGRAINE HEADACHE   . Neuromuscular disorder ([place])    condition where muscle separates from bone left chest, quarter size, pain intermittent    Past Surgical History:  Procedure Laterality Date  . BALLOON DILATION N/A 09/18/2020   Procedure: BALLOON DILATION;  Surgeon: Ronnette Juniper, MD;  Location: [facility];  Service: Gastroenterology;  Laterality: N/A;  . ESOPHAGOGASTRODUODENOSCOPY (EGD) WITH PROPOFOL N/A 09/18/2020   Procedure: ESOPHAGOGASTRODUODENOSCOPY (EGD) WITH PROPOFOL;  Surgeon: Ronnette Juniper, MD;  Location: [facility];  Service: Gastroenterology;  Laterality: N/A;  . EYE SURGERY Bilateral    Cataract L eye 1-20, R eye 3-20   . FOREIGN BODY  REMOVAL  09/18/2020   Procedure: FOREIGN BODY REMOVAL;  Surgeon: Ronnette Juniper, MD;  Location: [facility];  Service: Gastroenterology;;  . [place]  2001  . REVERSE SHOULDER ARTHROPLASTY Right 03/16/2019   Procedure: REVERSE SHOULDER ARTHROPLASTY;  Surgeon: Netta Cedars, MD;  Location: [facility];  Service: Orthopedics;  Laterality: Right;  interscalene block  . TUBAL LIGATION      Allergies: Aspirin, Cephalexin, Imipenem, Losartan potassium-hctz, Oxycodone, Statins, Clindamycin/lincomycin, Contrast media [iodinated diagnostic agents], Penicillins, and Quinolones  Medications: Prior to Admission medications   Medication Sig Start Date End Date Taking? Authorizing Provider  acetaminophen (TYLENOL) 500 MG tablet Take 1,000 mg by mouth every 6 (six) hours as needed for headache.   Yes [provider]  Calcium Carbonate+Vitamin D 600-200 MG-UNIT TABS Take 2 tablets by mouth 2 (two) times daily.   Yes [provider]  gabapentin (NEURONTIN) 300 MG capsule Take 399m(1 cap) in the morning, 3021m1 cap) in the afternoon and 60028m cap) before bed Patient taking differently: Take 300 mg by mouth See admin instructions. Take 300  mg in the morning, 300 mg in the afternoon and 600m43mfore bed 08/27/20  Yes AherMelvenia Beam  HYDROcodone-acetaminophen (NORCO/VICODIN) 5-325 MG tablet Take 1-2 tablets by mouth every 6 (six) hours as needed for moderate pain or severe pain. Patient taking differently: Take 2 tablets by mouth every 6 (six) hours as needed (headaches). 03/16/19  Yes NorrNetta Cedars  levothyroxine (SYNTHROID) 112 MCG tablet Take 112 mcg by mouth daily before breakfast.   Yes [provider]  LORazepam (ATIVAN) 2 MG tablet Take 2 mg by mouth 3 (three) times daily as needed for anxiety.  Yes [provider]  Menthol, Topical Analgesic, (ICY HOT EX) Apply 1 application topically as needed (back pain).   Yes [provider]  Multiple  Vitamins-Minerals (MULTIVITAMIN WITH MINERALS) tablet Take 1 tablet by mouth daily.   Yes [provider]  polycarbophil (FIBERCON) 625 MG tablet Take 1,250 mg by mouth daily.   Yes [provider]  sertraline (ZOLOFT) 50 MG tablet Take 50 mg by mouth daily.   Yes [provider]  sucralfate (CARAFATE) 1 g tablet Take 1 g by mouth 2 (two) times daily.   Yes [provider]     Family History  Problem Relation Age of Onset  . Heart disease Mother   . Stroke Father   . Arthritis Sister   . Diabetes Brother   . Arthritis Brother   . Neuropathy Brother   . Neuropathy Sister   . Diabetes Brother     Social History   Socioeconomic History  . Marital status: Married    Spouse name: Not on file  . Number of children: Not on file  . Years of education: Not on file  . Highest education level: Not on file  Occupational History  . Not on file  Tobacco Use  . Smoking status: Former Smoker    Quit date: 1969    Years since quitting: 53.3  . Smokeless tobacco: Never Used  Vaping Use  . Vaping Use: Never used  Substance and Sexual Activity  . Alcohol use: No  . Drug use: No  . Sexual activity: Not on file  Other Topics Concern  . Not on file  Social History Narrative  . Not on file   Social Determinants of Health   Financial Resource Strain: Not on file  Food Insecurity: Not on file  Transportation Needs: Not on file  Physical Activity: Not on file  Stress: Not on file  Social Connections: Not on file      Review of Systems see above; denies chest pain, cough, vomiting or bleeding   Vital Signs: BP 114/67   Pulse (!) 104   Temp (!) 100.9 F (38.3 C)   Resp 18   Ht _0  (1.651 m)   Wt 160 lb (72.6 kg)   SpO2 94%   BMI 26.63 kg/m   Physical Exam: Awake, alert.  Pale and jaundiced with mild scleral icterus.  Chest clear to auscultation bilaterally.  Heart with tachycardic rate, occasional ectopy.  Abdomen soft, positive bowel  sounds, currently nontender.  No lower extremity edema.  Imaging: CT Abdomen Pelvis Wo Contrast  Result Date: 01/02/2021 CLINICAL DATA:  Acute onset abdominal and back pain 4 days ago. EXAM: CT ABDOMEN AND PELVIS WITHOUT CONTRAST TECHNIQUE: Multidetector CT imaging of the abdomen and pelvis was performed following the standard protocol without IV contrast. COMPARISON:  CT abdomen and pelvis 09/16/2020. FINDINGS: Lower chest: Mild dependent atelectasis. Lung bases otherwise clear. No pleural or pericardial effusion. Hepatobiliary: No focal liver abnormality is seen. No gallstones, gallbladder wall thickening, or biliary dilatation. Pancreas: Unremarkable. No pancreatic ductal dilatation or surrounding inflammatory changes. Spleen: Normal in size without focal abnormality. Adrenals/Urinary Tract: The adrenal glands appear normal. No renal or ureteral stones or hydronephrosis. Small cyst in the right kidney is noted but better seen on the patient's prior CT without IV contrast. Urinary bladder appears normal. Stomach/Bowel: Stomach is within normal limits. Negative for appendicitis. Small appendicolith noted. No evidence of bowel wall thickening, distention, or inflammatory changes. Vascular/Lymphatic: Aortic atherosclerosis. No enlarged abdominal  or pelvic lymph nodes. Reproductive: Uterus and bilateral adnexa are unremarkable. Other: None. Musculoskeletal: No acute abnormality. 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 IMPRESSION: Negative for urinary tract stone. No acute abnormality or finding to explain the patient's symptoms. Aortic Atherosclerosis (ICD10-I70.0). Electronically Signed   By: Inge Rise M.D.   On: 01/02/2021 13:25   DG Chest Port 1 View  Result Date: 01/02/2021 CLINICAL DATA:  Shortness of breath and abdominal pain. EXAM: PORTABLE CHEST 1 VIEW COMPARISON:  04/18/2019 FINDINGS: Lungs are adequately inflated and otherwise clear. Cardiomediastinal  silhouette and remainder of the exam is unchanged. IMPRESSION: No active disease. Electronically Signed   By: Marin Olp M.D.   On: 01/02/2021 12:30    Labs:  CBC: Recent Labs    08/21/20 1519 01/02/21 1056 01/02/21 1400 01/02/21 1612 01/03/21 0341  WBC 6.4 24.7*  --   --  15.8*  HGB 13.8 5.7*  --  5.3* 4.3*  HCT 40.6 15.9*  --  15.3* 12.9*  PLT 388 522* 451*  --  383    COAGS: Recent Labs    01/02/21 1400  INR 1.1  APTT 29    BMP: Recent Labs    08/21/20 1519 01/02/21 1056 01/03/21 0341  NA 139 136 137  K 4.5 3.5 4.0  CL 102 107 109  CO2 22 17* 21*  GLUCOSE 95 156* 107*  BUN _0 CALCIUM 10.1 9.3 8.5*  CREATININE 0.51* 0.70 0.56  GFRNONAA 96 >60 >60  GFRAA 111  --   --     LIVER FUNCTION TESTS: Recent Labs    08/21/20 1519 01/02/21 1056 01/03/21 0341  BILITOT 0.3 6.5* 5.3*  AST 19 33 29  ALT _1 ALKPHOS 61 60 46  PROT 6.8 7.5 5.8*  ALBUMIN 4.5 4.3 3.4*    TUMOR MARKERS: No results for input(s): AFPTM, CEA, CA199, CHROMGRNA in the last 8760 hours.  Assessment and Plan: 75 y.o. female with past medical history of hypothyroidism, migraine headaches, and polyneuropathy.  She was admitted to [facility] on 4/15 with generalized weakness, recent abdominal and back pain, nausea, some dyspnea with exertion, mild temperature elevation ,presyncope-dizziness, pounding/ throbbing in her ear.  She was found to be anemic and 2 units of red blood cells were given.  CT abdomen pelvis was negative.  Chest x-ray was negative.  COVID-19 negative.  Current temperature 100.9.  She is tachycardic.  Current labs include WBC 15.8, hemoglobin 4.3, platelets 383k, creatinine 0.56, total bilirubin 5.3.  Elevated reticulocyte count.  LDH 331.  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  Request now received from oncology for CT-guided bone marrow biopsy for further evaluation, rule out lymphoma/leukemia.Risks and benefits of procedure was  discussed with the patient/spouse including, but not limited to bleeding, infection, damage to adjacent structures or low yield requiring additional tests.  All of the questions were answered and there is agreement to proceed.  Consent signed and in chart.  Procedure tentatively scheduled for 4/18   Thank you for this interesting consult.  I greatly enjoyed meeting AVALINE STILLSON and look forward to participating in their care.  A copy of this report was sent to the requesting provider on this date.  Electronically Signed: D. Rowe Robert, PA-C 01/03/2021, 2:53 PM   I spent a total of  20 minutes   in face to face in clinical consultation, greater than 50% of which was counseling/coordinating care for CT-guided bone marrow  biopsy

## 2021-01-03 NOTE — Progress Notes (Signed)
Pts oral temp at 1500 was 99.7. Dr.Gorsuch notified and gave verbal instructions to restart blood. Temp at 1515 rechecked before restart of blood and temp was found to be 98.7. Blood restarted at 1517 and temp checked after restart and was found to be 100.4. Blood stopped and disconnected at this time. Primary RN notified.

## 2021-01-03 NOTE — Progress Notes (Signed)
Night hospitalist [place] paged bp and hgb.

## 2021-01-03 NOTE — Progress Notes (Addendum)
Called to follow up with physician about patient receiving blood and to inquire about pre-medication prior to blood transfusion. Per MD Gorsch, the prednisone she received this a.m is considered pre-medication. Also made MD aware of the " best match," blood notification received from blood blank. MD Gorsch aware, and would like to proceed with the transfusion of both units.

## 2021-01-03 NOTE — Progress Notes (Signed)
PROGRESS NOTE [facility]   [name]   IRW:[id number] DOB: [birth year]  DOA: 01/02/2021 PCP: Lorene Dy, MD   Brief Narrative:  [name] is a 75 year old female with past medical history significant for hypothyroidism and polyneuropathy who presented to the ER with generalized weakness.  Upon ED evaluation she was found to be anemic with hemoglobin of 5.7 from 13.  Elevated bilirubin, mostly indirect, elevated LDH and elevated D-dimer.  CT abdomen and pelvis was normal with negative FOBT.  Negative chest x-ray.  Patient was admitted to the hospital for evaluation of hemolytic anemia.  Hematology is consulted currently on prednisone, famciclovir and Diflucan.  She is awaiting blood transfusions.   Subjective: Patient seen and examined with husband at beside, she report feeling ok beside headaches. Awaiting blood transfusion.  Her blood was positive for antigen and no blood available locally, match coming from [place].  No acute events overnight, no other concerns.   Assessment & Plan:   Active Problems:   Symptomatic anemia  Hemolytic anemia Likely autoimmune, unclear what triggered this.  Hematology recommendations appreciated.  Currently on prednisone 80 mg daily and folic acid 2 mg daily.  We are awaiting for transfusion, she developed mild low grade fever, she needs to be pretreated for transfusions with Pepcid, IV steroid and Tylenol.  If temp below 100 okay to transfuse.  She needs to continue antifungal and antiviral prophylaxis.  Scheduled for bone marrow biopsy.  Reticulocyte count will be measured to utilize as how well she is responding to treatment.  Holding on IV Ig for now.  Bilirubin improving.  Hypothyroidism  Continue Synthroid at current dose.  Anxiety  Continue Zoloft at home dose.  Ativan as needed for anxiety.  Polyneuropathy Continue gabapentin  DVT prophylaxis: SCD's  Code Status: Full  Family Communication: Husband at  bedside Disposition Plan: Inpatient, MedSurg no stable for discharge as she needs blood transfusion and confirm that hemolysis have been stabilized  Consultants:   Hematologist  IR  Procedures:     Antimicrobials:  Diflucan   Famciclovir   Objective: Vitals:   01/02/21 2203 01/03/21 0403 01/03/21 0405 01/03/21 0535  BP: (!) 117/41 (!) 87/44 (!) 95/49 (!) 128/55  Pulse: 83 81 81 94  Resp: (!) 21 18    Temp: 98.4 F (36.9 C) 98.5 F (36.9 C)    TempSrc: Oral Oral    SpO2: 98% 98%    Weight:      Height:        Intake/Output Summary (Last 24 hours) at 01/03/2021 1020 Last data filed at 01/03/2021 0900 Gross per 24 hour  Intake 1188.41 ml  Output 1230 ml  Net -41.59 ml   Filed Weights   01/02/21 1035  Weight: 72.6 kg    Examination:  General exam: Appears calm and comfortable, pale HEENT: AC/AT, PERRLA, OP moist and clear Respiratory system: Clear to auscultation. No wheezes,crackle or rhonchi Cardiovascular system: S1 & S2 heard, RRR. No JVD, murmurs, rubs or gallops Gastrointestinal system: Abdomen is nondistended, soft and nontender. No organomegaly or masses felt. Normal bowel sounds heard. Central nervous system: Alert and oriented. No focal neurological deficits. Extremities: No pedal edema. Symmetric, strength 5/5   Skin: No rashes, lesions or ulcers Psychiatry: Judgement and insight appear normal. Mood & affect appropriate.    Data Reviewed: I have personally reviewed following labs and imaging studies  CBC: Recent Labs  Lab 01/02/21 1056 01/02/21 1400 01/02/21 1612 01/03/21 0341  WBC 24.7*  --   --  15.8*  NEUTROABS 20.6*  --   --   --   HGB 5.7*  --  5.3* 4.3*  HCT 15.9*  --  15.3* 12.9*  MCV 109.7*  --   --  112.2*  PLT 522* 451*  --  147   Basic Metabolic Panel: Recent Labs  Lab 01/02/21 1056 01/03/21 0341  NA 136 137  K 3.5 4.0  CL 107 109  CO2 17* 21*  GLUCOSE 156* 107*  BUN 20 18  CREATININE 0.70 0.56  CALCIUM 9.3 8.5*    GFR: Estimated Creatinine Clearance: 63.4 mL/min (by C-G formula based on SCr of 0.56 mg/dL). Liver Function Tests: Recent Labs  Lab 01/02/21 1056 01/03/21 0341  AST 33 29  ALT 25 20  ALKPHOS 60 46  BILITOT 6.5* 5.3*  PROT 7.5 5.8*  ALBUMIN 4.3 3.4*   Recent Labs  Lab 01/02/21 1056  LIPASE 37   No results for input(s): AMMONIA in the last 168 hours. Coagulation Profile: Recent Labs  Lab 01/02/21 1400  INR 1.1   Cardiac Enzymes: No results for input(s): CKTOTAL, CKMB, CKMBINDEX, TROPONINI in the last 168 hours. BNP (last 3 results) No results for input(s): PROBNP in the last 8760 hours. HbA1C: No results for input(s): HGBA1C in the last 72 hours. CBG: No results for input(s): GLUCAP in the last 168 hours. Lipid Profile: No results for input(s): CHOL, HDL, LDLCALC, TRIG, CHOLHDL, LDLDIRECT in the last 72 hours. Thyroid Function Tests: No results for input(s): TSH, T4TOTAL, FREET4, T3FREE, THYROIDAB in the last 72 hours. Anemia Panel: Recent Labs    01/02/21 1612 01/03/21 0341  VITAMINB12 422  --   FOLATE 39.0  --   FERRITIN 1,354*  --   TIBC 344  --   IRON 213*  --   RETICCTPCT 14.7* 17.1*   Sepsis Labs: Recent Labs  Lab 01/02/21 1612 01/02/21 1851  LATICACIDVEN 2.5* 3.2*    Recent Results (from the past 240 hour(s))  Resp Panel by RT-PCR (Flu A&B, Covid) Nasopharyngeal Swab     Status: None   Collection Time: 01/02/21 12:15 PM   Specimen: Nasopharyngeal Swab; Nasopharyngeal(NP) swabs in vial transport medium  Result Value Ref Range Status   SARS Coronavirus 2 by RT PCR NEGATIVE NEGATIVE Final    Comment: (NOTE) SARS-CoV-2 target nucleic acids are NOT DETECTED.  The SARS-CoV-2 RNA is generally detectable in upper respiratory specimens during the acute phase of infection. The lowest concentration of SARS-CoV-2 viral copies this assay can detect is 138 copies/mL. A negative result does not preclude SARS-Cov-2 infection and should not be used as  the sole basis for treatment or other patient management decisions. A negative result may occur with  improper specimen collection/handling, submission of specimen other than nasopharyngeal swab, presence of viral mutation(s) within the areas targeted by this assay, and inadequate number of viral copies(<138 copies/mL). A negative result must be combined with clinical observations, patient history, and epidemiological information. The expected result is Negative.  Fact Sheet for Patients:  EntrepreneurPulse.com.au  Fact Sheet for Healthcare Providers:  IncredibleEmployment.be  This test is no t yet approved or cleared by the [place] FDA and  has been authorized for detection and/or diagnosis of SARS-CoV-2 by FDA under an Emergency Use Authorization (EUA). This EUA will remain  in effect (meaning this test can be used) for the duration of the COVID-19 declaration under Section 564(b)(1) of the Act, 21 U.S.C.section 360bbb-3(b)(1), unless the authorization is terminated  or revoked sooner.  Influenza A by PCR NEGATIVE NEGATIVE Final   Influenza B by PCR NEGATIVE NEGATIVE Final    Comment: (NOTE) The Xpert Xpress SARS-CoV-2/FLU/RSV plus assay is intended as an aid in the diagnosis of influenza from Nasopharyngeal swab specimens and should not be used as a sole basis for treatment. Nasal washings and aspirates are unacceptable for Xpert Xpress SARS-CoV-2/FLU/RSV testing.  Fact Sheet for Patients: EntrepreneurPulse.com.au  Fact Sheet for Healthcare Providers: IncredibleEmployment.be  This test is not yet approved or cleared by the [place] FDA and has been authorized for detection and/or diagnosis of SARS-CoV-2 by FDA under an Emergency Use Authorization (EUA). This EUA will remain in effect (meaning this test can be used) for the duration of the COVID-19 declaration under Section 564(b)(1) of  the Act, 21 U.S.C. section 360bbb-3(b)(1), unless the authorization is terminated or revoked.  Performed at [facility] [place] [address], [place], [place] [address]       Radiology Studies: CT Abdomen Pelvis Wo Contrast  Result Date: 01/02/2021 CLINICAL DATA:  Acute onset abdominal and back pain 4 days ago. EXAM: CT ABDOMEN AND PELVIS WITHOUT CONTRAST TECHNIQUE: Multidetector CT imaging of the abdomen and pelvis was performed following the standard protocol without IV contrast. COMPARISON:  CT abdomen and pelvis 09/16/2020. FINDINGS: Lower chest: Mild dependent atelectasis. Lung bases otherwise clear. No pleural or pericardial effusion. Hepatobiliary: No focal liver abnormality is seen. No gallstones, gallbladder wall thickening, or biliary dilatation. Pancreas: Unremarkable. No pancreatic ductal dilatation or surrounding inflammatory changes. Spleen: Normal in size without focal abnormality. Adrenals/Urinary Tract: The adrenal glands appear normal. No renal or ureteral stones or hydronephrosis. Small cyst in the right kidney is noted but better seen on the patient's prior CT without IV contrast. Urinary bladder appears normal. Stomach/Bowel: Stomach is within normal limits. Negative for appendicitis. Small appendicolith noted. No evidence of bowel wall thickening, distention, or inflammatory changes. Vascular/Lymphatic: Aortic atherosclerosis. No enlarged abdominal or pelvic lymph nodes. Reproductive: Uterus and bilateral adnexa are unremarkable. Other: None. Musculoskeletal: No acute abnormality. 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 IMPRESSION: Negative for urinary tract stone. No acute abnormality or finding to explain the patient's symptoms. Aortic Atherosclerosis (ICD10-I70.0). Electronically Signed   By: Inge Rise M.D.   On: 01/02/2021 13:25   DG Chest Port 1 View  Result Date: 01/02/2021 CLINICAL DATA:  Shortness of breath and  abdominal pain. EXAM: PORTABLE CHEST 1 VIEW COMPARISON:  04/18/2019 FINDINGS: Lungs are adequately inflated and otherwise clear. Cardiomediastinal silhouette and remainder of the exam is unchanged. IMPRESSION: No active disease. Electronically Signed   By: Marin Olp M.D.   On: 01/02/2021 12:30      Scheduled Meds: . famciclovir  500 mg Oral Daily  . fluconazole  100 mg Oral Daily  . folic acid  2 mg Oral Daily  . gabapentin  300 mg Oral 2 times per day   And  . gabapentin  600 mg Oral QHS  . levothyroxine  112 mcg Oral Q0600  . multivitamin with minerals  1 tablet Oral Daily  . pantoprazole  40 mg Oral BID  . polycarbophil  1,250 mg Oral Q lunch  . predniSONE  80 mg Oral Q breakfast  . sertraline  50 mg Oral Daily  . sucralfate  1 g Oral BID   Continuous Infusions: . sodium chloride 75 mL/hr at 01/03/21 0439     LOS: 0 days    Time spent: Total of 45 minutes spent with pt,  greater than 50% of which was spent in discussion of  treatment, counseling and coordination of care    Chipper Oman, MD  How to contact the [facility] Attending or Consulting provider [place] or covering provider during after hours [place], for this patient?  1. Check the care team in [facility] and look for a) attending/consulting [facility] provider listed and b) the [facility] team listed 2. Log into [email or web address] and use [place]'s universal password to access. If you do not have the password, please contact the hospital operator. 3. Locate the [facility] provider you are looking for under [facility] and page to a number that you can be directly reached. 4. If you still have difficulty reaching the provider, please page the [facility] (Director on Call) for the Hospitalists listed on amion for assistance.

## 2021-01-03 NOTE — Progress Notes (Addendum)
   01/03/21 1326 01/03/21 1330 01/03/21 1333  Vitals  Vital Signs Type (Include Temp, Pulse, RR, and B/P) 15 min. post blood start  --   --   Temp 99.9 F (37.7 C) (!) 100.5 F (38.1 C) (!) 102.1 F (38.9 C)  Temp Source Oral Oral Oral  Pulse Rate (!) 104  --   --   ECG Heart Rate (!) 104  --   --   Resp 16  --   --   BP (!) 116/48  --   --   Oxygen Therapy  SpO2 98 %  --   --   O2 Device Nasal Cannula  --   --   O2 Flow Rate (L/min) 4 L/min  --   --   15 minute post transfusion. Low grade fever noted. Patient endorses feeling fine. Patient alert and oriented x 4, rr even and unlabored on 4L of nasal cannula. Checked temperature 3 minutes after 15 minute post blood starting vitals and temperature continued to be elevated above baseline temperature starting. Made MD Goshner aware, and pre-transfusion medications were ordered.  Blood transfusion stopped at this time.  Per MD give medications and restart medications after medications have been administered.

## 2021-01-03 NOTE — Plan of Care (Signed)

## 2021-01-03 NOTE — Progress Notes (Signed)
Transferred to [place] icu 1231 with belongings. Patient alert and oriented during this time. On 4L of nasal cannula. Slid over to the bed. Report given to [facility] RN.

## 2021-01-03 NOTE — Progress Notes (Signed)
[name] is a little more fatigued.  She has not yet been transfused.  The blood bank says that she is getting her blood from [place].  The blood is on his way.  Her hemoglobin today is 4.3 so she clearly needs it.   She is still hemolyzing.  Reticulocyte count is 17%.  She was started on prednisone.  I will try to hold off on IVIG right now.  She is not iron deficient.  Iron saturation is 62%.  Her B12 is okay at 422.  She has a LDH of 331.  She has a normal BUN and creatinine.  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  I am not sure why she has the autoimmune hemolysis.  This is a warm autoantibody.  We will have her set up for a bone marrow biopsy to make sure she does not have any type of bone marrow malignancy.  She has had no fever.  There is been no obvious bleeding.  She definitely needs to have a transfusion.  The blood bank says that she will have her blood arrived this morning.  I would like to hope that in the future, we might build to use blood locally that is least incompatible.  I suspect we are probably going to have to transfuse her again.  I do not see any obvious evidence of congestive heart failure.  She certainly is at risk for high output failure.  I know that she will get wonderful care from all staff on 4 W.  Lattie Haw, MD  [place] 28:6

## 2021-01-03 NOTE — Progress Notes (Addendum)
Due to patient spiking temperature after second attempt at restarting blood. Blood transfusion has now been stopped. Made MD Gosher aware, per MD Moishe Spice, "she does not recommend transfusing patient with temperature." Recommends reaching out to the lab and hospitalist for further recommendations.Rapid nurse also aware of temperature spiking on patient. Patient is not symptomatic at this time. Also made MD [facility] aware. No new recommendation at this time. Will continue to monitor.

## 2021-01-04 DIAGNOSIS — D589 Hereditary hemolytic anemia, unspecified: Secondary | ICD-10-CM | POA: Diagnosis not present

## 2021-01-04 DIAGNOSIS — I82402 Acute embolism and thrombosis of unspecified deep veins of left lower extremity: Secondary | ICD-10-CM | POA: Diagnosis not present

## 2021-01-04 DIAGNOSIS — D591 Autoimmune hemolytic anemia, unspecified: Secondary | ICD-10-CM | POA: Diagnosis not present

## 2021-01-04 DIAGNOSIS — E039 Hypothyroidism, unspecified: Secondary | ICD-10-CM | POA: Diagnosis not present

## 2021-01-04 DIAGNOSIS — D72829 Elevated white blood cell count, unspecified: Secondary | ICD-10-CM | POA: Diagnosis not present

## 2021-01-04 LAB — CBC
HCT: 13.2 % — ABNORMAL LOW (ref 36.0–46.0)
Hemoglobin: 4.3 g/dL — CL (ref 12.0–15.0)
MCH: 38.1 pg — ABNORMAL HIGH (ref 26.0–34.0)
MCHC: 32.6 g/dL (ref 30.0–36.0)
MCV: 116.8 fL — ABNORMAL HIGH (ref 80.0–100.0)
Platelets: 339 10*3/uL (ref 150–400)
RBC: 1.13 MIL/uL — ABNORMAL LOW (ref 3.87–5.11)
WBC: 19.3 10*3/uL — ABNORMAL HIGH (ref 4.0–10.5)
nRBC: 5.3 % — ABNORMAL HIGH (ref 0.0–0.2)

## 2021-01-04 LAB — RETICULOCYTES
Immature Retic Fract: 50.2 % — ABNORMAL HIGH (ref 2.3–15.9)
RBC.: 1.14 MIL/uL — ABNORMAL LOW (ref 3.87–5.11)
Retic Count, Absolute: 248.1 10*3/uL — ABNORMAL HIGH (ref 19.0–186.0)
Retic Ct Pct: 21.8 % — ABNORMAL HIGH (ref 0.4–3.1)

## 2021-01-04 LAB — COMPREHENSIVE METABOLIC PANEL
ALT: 24 U/L (ref 0–44)
AST: 27 U/L (ref 15–41)
Albumin: 3.3 g/dL — ABNORMAL LOW (ref 3.5–5.0)
Alkaline Phosphatase: 45 U/L (ref 38–126)
Anion gap: 8 (ref 5–15)
BUN: 16 mg/dL (ref 8–23)
CO2: 21 mmol/L — ABNORMAL LOW (ref 22–32)
Calcium: 8.6 mg/dL — ABNORMAL LOW (ref 8.9–10.3)
Chloride: 109 mmol/L (ref 98–111)
Creatinine, Ser: 0.59 mg/dL (ref 0.44–1.00)
GFR, Estimated: >60 mL/min (ref >60)
Glucose, Bld: 132 mg/dL — ABNORMAL HIGH (ref 70–99)
Potassium: 3.8 mmol/L (ref 3.5–5.1)
Sodium: 138 mmol/L (ref 135–145)
Total Bilirubin: 2.3 mg/dL — ABNORMAL HIGH (ref 0.3–1.2)
Total Protein: 5.9 g/dL — ABNORMAL LOW (ref 6.5–8.1)

## 2021-01-04 LAB — LACTATE DEHYDROGENASE: LDH: 324 U/L — ABNORMAL HIGH (ref 98–192)

## 2021-01-04 MED ORDER — SODIUM CHLORIDE 0.9 % IV SOLN: INTRAVENOUS | Status: AC

## 2021-01-04 MED ORDER — IMMUNE GLOBULIN (HUMAN) 10 GM/100ML IV SOLN: 1.0000 g/kg | INTRAVENOUS | Status: DC

## 2021-01-04 MED ORDER — IMMUNE GLOBULIN (HUMAN) 10 GM/100ML IV SOLN
1.0000 g/kg | INTRAVENOUS | Status: AC
  Administered 2021-01-04 – 2021-01-05 (×2): 75 g via INTRAVENOUS
  Filled 2021-01-04: qty 600
  Filled 2021-01-04: qty 50

## 2021-01-04 MED ORDER — FAMOTIDINE 20 MG PO TABS
20.0000 mg | ORAL_TABLET | ORAL | Status: AC
Start: 2021-01-04 — End: 2021-01-05
  Administered 2021-01-04 – 2021-01-05 (×2): 20 mg via ORAL
  Filled 2021-01-04 (×2): qty 1

## 2021-01-04 MED ORDER — DIPHENHYDRAMINE HCL 25 MG PO CAPS
25.0000 mg | ORAL_CAPSULE | ORAL | Status: AC
Start: 2021-01-04 — End: 2021-01-05
  Administered 2021-01-04 – 2021-01-05 (×2): 25 mg via ORAL
  Filled 2021-01-04 (×2): qty 1

## 2021-01-04 NOTE — Progress Notes (Signed)
PROGRESS NOTE    [name]  VVO:[id number] DOB: [birth year] DOA: 01/02/2021 PCP: Lorene Dy, MD   Brief Narrative:  [name] is a 75 year old female with past medical history significant for hypothyroidism and polyneuropathy who presented to the ER with generalized weakness.  Upon ED evaluation she was found to be anemic with hemoglobin of 5.7 from 13.  Elevated bilirubin, mostly indirect, elevated LDH and elevated D-dimer. CT abdomen and pelvis was normal with negative FOBT.  Negative chest x-ray.  Patient was admitted to the hospital for evaluation of hemolytic anemia.  Hematology consulted currently on prednisone, famciclovir and Diflucan.   Assessment & Plan:   Active Problems:   Symptomatic anemia   Hemolytic anemia (HCC)   Autoimmune hemolytic anemia, POA Hematology recommendations appreciated.  Currently on prednisone 80 mg daily and folic acid 2 mg daily.   Mild transfusion reaction 4/16 - holding further transfusion for now per heme/onc - IVIG initiated 4/17 She needs to continue antifungal and antiviral prophylaxis.   Scheduled for bone marrow biopsy.   Reticulocyte count improving 14.7>>17.1>>21.8 Lab Results  Component Value Date   RETICCTPCT 21.8 (H) 01/04/2021   Hypothyroidism  Continue Synthroid at current dose.  Anxiety  Continue Zoloft at home dose.  Ativan as needed for anxiety.  Polyneuropathy Continue gabapentin  DVT prophylaxis: SCD's  Code Status: Full  Family Communication: Husband at bedside  Status is: inpt  Dispo: The patient is from: home              Anticipated d/c is to: home              Anticipated d/c date is: 48-72h              Patient currently NOT medically stable for discharge  Consultants:   Heme/Onc  Procedures:   None  Antimicrobials:  Fluconazole, famcyclovir ongoing  Subjective: No acute issues/events overnight  Objective: Vitals:   01/04/21 0202 01/04/21 0300 01/04/21 0400 01/04/21 0600   BP: (!) 107/37 (!) 108/40 (!) 104/42 (!) 102/39  Pulse: 79 76 77 84  Resp: (!) 25 17 (!) 21 (!) 21  Temp:   98.3 F (36.8 C)   TempSrc:   Oral   SpO2: 100% 100% 100% 100%  Weight:      Height:        Intake/Output Summary (Last 24 hours) at 01/04/2021 0711 Last data filed at 01/04/2021 0501 Gross per 24 hour  Intake 537.36 ml  Output 300 ml  Net 237.36 ml   Filed Weights   01/02/21 1035  Weight: 72.6 kg    Examination:  General exam: Appears calm and comfortable; somewhat palorous Respiratory system: Clear to auscultation. Respiratory effort normal. Cardiovascular system: S1 & S2 heard, RRR. No JVD, murmurs, rubs, gallops or clicks. No pedal edema. Gastrointestinal system: Abdomen is nondistended, soft and nontender. No organomegaly or masses felt. Normal bowel sounds heard. Central nervous system: Alert and oriented. No focal neurological deficits. Extremities: Symmetric 5 x 5 power. Skin: No rashes, lesions or ulcers Psychiatry: Judgement and insight appear normal. Mood & affect appropriate.   Data Reviewed: I have personally reviewed following labs and imaging studies  CBC: Recent Labs  Lab 01/02/21 1056 01/02/21 1400 01/02/21 1612 01/03/21 0341 01/04/21 0244  WBC 24.7*  --   --  15.8* 19.3*  NEUTROABS 20.6*  --   --   --   --   HGB 5.7*  --  5.3* 4.3* 4.3*  HCT 15.9*  --  15.3* 12.9* 13.2*  MCV 109.7*  --   --  112.2* 116.8*  PLT 522* 451*  --  383 277   Basic Metabolic Panel: Recent Labs  Lab 01/02/21 1056 01/03/21 0341 01/04/21 0244  NA 136 137 138  K 3.5 4.0 3.8  CL 107 109 109  CO2 17* 21* 21*  GLUCOSE 156* 107* 132*  BUN 20 18 16   CREATININE 0.70 0.56 0.59  CALCIUM 9.3 8.5* 8.6*   GFR: Estimated Creatinine Clearance: 63.4 mL/min (by C-G formula based on SCr of 0.59 mg/dL). Liver Function Tests: Recent Labs  Lab 01/02/21 1056 01/03/21 0341 01/04/21 0244  AST 33 29 27  ALT 25 20 24   ALKPHOS 60 46 45  BILITOT 6.5* 5.3* 2.3*  PROT 7.5  5.8* 5.9*  ALBUMIN 4.3 3.4* 3.3*   Recent Labs  Lab 01/02/21 1056  LIPASE 37   No results for input(s): AMMONIA in the last 168 hours. Coagulation Profile: Recent Labs  Lab 01/02/21 1400  INR 1.1   Cardiac Enzymes: No results for input(s): CKTOTAL, CKMB, CKMBINDEX, TROPONINI in the last 168 hours. BNP (last 3 results) No results for input(s): PROBNP in the last 8760 hours. HbA1C: No results for input(s): HGBA1C in the last 72 hours. CBG: No results for input(s): GLUCAP in the last 168 hours. Lipid Profile: No results for input(s): CHOL, HDL, LDLCALC, TRIG, CHOLHDL, LDLDIRECT in the last 72 hours. Thyroid Function Tests: No results for input(s): TSH, T4TOTAL, FREET4, T3FREE, THYROIDAB in the last 72 hours. Anemia Panel: Recent Labs    01/02/21 1612 01/03/21 0341 01/04/21 0244  VITAMINB12 422  --   --   FOLATE 39.0  --   --   FERRITIN 1,354*  --   --   TIBC 344  --   --   IRON 213*  --   --   RETICCTPCT 14.7* 17.1* 21.8*   Sepsis Labs: Recent Labs  Lab 01/02/21 1612 01/02/21 1851  LATICACIDVEN 2.5* 3.2*    Recent Results (from the past 240 hour(s))  Resp Panel by RT-PCR (Flu A&B, Covid) Nasopharyngeal Swab     Status: None   Collection Time: 01/02/21 12:15 PM   Specimen: Nasopharyngeal Swab; Nasopharyngeal(NP) swabs in vial transport medium  Result Value Ref Range Status   SARS Coronavirus 2 by RT PCR NEGATIVE NEGATIVE Final    Comment: (NOTE) SARS-CoV-2 target nucleic acids are NOT DETECTED.  The SARS-CoV-2 RNA is generally detectable in upper respiratory specimens during the acute phase of infection. The lowest concentration of SARS-CoV-2 viral copies this assay can detect is 138 copies/mL. A negative result does not preclude SARS-Cov-2 infection and should not be used as the sole basis for treatment or other patient management decisions. A negative result may occur with  improper specimen collection/handling, submission of specimen other than  nasopharyngeal swab, presence of viral mutation(s) within the areas targeted by this assay, and inadequate number of viral copies(<138 copies/mL). A negative result must be combined with clinical observations, patient history, and epidemiological information. The expected result is Negative.  Fact Sheet for Patients:  EntrepreneurPulse.com.au  Fact Sheet for Healthcare Providers:  IncredibleEmployment.be  This test is no t yet approved or cleared by the [place] FDA and  has been authorized for detection and/or diagnosis of SARS-CoV-2 by FDA under an Emergency Use Authorization (EUA). This EUA will remain  in effect (meaning this test can be used) for the duration of the COVID-19 declaration under Section 564(b)(1) of the Act, 21 U.S.C.section 360bbb-3(b)(1),  unless the authorization is terminated  or revoked sooner.       Influenza A by PCR NEGATIVE NEGATIVE Final   Influenza B by PCR NEGATIVE NEGATIVE Final    Comment: (NOTE) The Xpert Xpress SARS-CoV-2/FLU/RSV plus assay is intended as an aid in the diagnosis of influenza from Nasopharyngeal swab specimens and should not be used as a sole basis for treatment. Nasal washings and aspirates are unacceptable for Xpert Xpress SARS-CoV-2/FLU/RSV testing.  Fact Sheet for Patients: EntrepreneurPulse.com.au  Fact Sheet for Healthcare Providers: IncredibleEmployment.be  This test is not yet approved or cleared by the [place] FDA and has been authorized for detection and/or diagnosis of SARS-CoV-2 by FDA under an Emergency Use Authorization (EUA). This EUA will remain in effect (meaning this test can be used) for the duration of the COVID-19 declaration under Section 564(b)(1) of the Act, 21 U.S.C. section 360bbb-3(b)(1), unless the authorization is terminated or revoked.  Performed at [facility], [address] [place], [place] [address]   MRSA PCR Screening     Status: None   Collection Time: 01/03/21  6:24 PM   Specimen: Nasal Mucosa; Nasopharyngeal  Result Value Ref Range Status   MRSA by PCR NEGATIVE NEGATIVE Final    Comment:        The GeneXpert MRSA Assay (FDA approved for NASAL specimens only), is one component of a comprehensive MRSA colonization surveillance program. It is not intended to diagnose MRSA infection nor to guide or monitor treatment for MRSA infections. Performed at [facility], [place] [address], [place], [place] [address]          Radiology Studies: CT Abdomen Pelvis Wo Contrast  Result Date: 01/02/2021 CLINICAL DATA:  Acute onset abdominal and back pain 4 days ago. EXAM: CT ABDOMEN AND PELVIS WITHOUT CONTRAST TECHNIQUE: Multidetector CT imaging of the abdomen and pelvis was performed following the standard protocol without IV contrast. COMPARISON:  CT abdomen and pelvis 09/16/2020. FINDINGS: Lower chest: Mild dependent atelectasis. Lung bases otherwise clear. No pleural or pericardial effusion. Hepatobiliary: No focal liver abnormality is seen. No gallstones, gallbladder wall thickening, or biliary dilatation. Pancreas: Unremarkable. No pancreatic ductal dilatation or surrounding inflammatory changes. Spleen: Normal in size without focal abnormality. Adrenals/Urinary Tract: The adrenal glands appear normal. No renal or ureteral stones or hydronephrosis. Small cyst in the right kidney is noted but better seen on the patient's prior CT without IV contrast. Urinary bladder appears normal. Stomach/Bowel: Stomach is within normal limits. Negative for appendicitis. Small appendicolith noted. No evidence of bowel wall thickening, distention, or inflammatory changes. Vascular/Lymphatic: Aortic atherosclerosis. No enlarged abdominal or pelvic lymph nodes. Reproductive: Uterus and bilateral adnexa are unremarkable. Other: None. Musculoskeletal: No acute  abnormality. 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 IMPRESSION: Negative for urinary tract stone. No acute abnormality or finding to explain the patient's symptoms. Aortic Atherosclerosis (ICD10-I70.0). Electronically Signed   By: Inge Rise M.D.   On: 01/02/2021 13:25   DG Chest Port 1 View  Result Date: 01/02/2021 CLINICAL DATA:  Shortness of breath and abdominal pain. EXAM: PORTABLE CHEST 1 VIEW COMPARISON:  04/18/2019 FINDINGS: Lungs are adequately inflated and otherwise clear. Cardiomediastinal silhouette and remainder of the exam is unchanged. IMPRESSION: No active disease. Electronically Signed   By: Marin Olp M.D.   On: 01/02/2021 12:30    Scheduled Meds: . Chlorhexidine Gluconate Cloth  6 each Topical Daily  . famciclovir  500 mg Oral Daily  . fluconazole  100 mg Oral Daily  . folic acid  2 mg Oral Daily  . gabapentin  300 mg Oral 2 times per day   And  . gabapentin  600 mg Oral QHS  . levothyroxine  112 mcg Oral Q0600  . mouth rinse  15 mL Mouth Rinse BID  . multivitamin with minerals  1 tablet Oral Daily  . pantoprazole  40 mg Oral BID  . polycarbophil  1,250 mg Oral Q lunch  . predniSONE  80 mg Oral Q breakfast  . sertraline  50 mg Oral Daily  . sucralfate  1 g Oral BID   Continuous Infusions: . sodium chloride 75 mL/hr at 01/04/21 0501     LOS: 1 day   Time spent: 101mn  Chieko Neises C Bryar Dahms, DO [facility]  If 7PM-7AM, please contact night-coverage [email or web address]  01/04/2021, 7:11 AM

## 2021-01-04 NOTE — Progress Notes (Signed)
While rounding on unit pt's nurse requested a visit for pt.  Chaplain found pt alert and anxious to talk as her pastor cannot come today to visit with her. Chaplain offered ministry of presence while pt spoke of having anxiety around all that is going on in her life, concerns about her husband who has early-onset dementia being without her.  Pt was panting so Chaplain laid hands on forehead and heart (with permission) and helped pt slow her breathing.  We imaged Christ as he was with the little children when he held them in his lap.  Chaplain found on YouTube. her favorite hymn, "Christ the Lord Is Risen Today" sung by [employer].    Patient asked Chaplain to make return visit.  Chaplain explained she would make a referral to day Chaplains who will visit this week, assuring pt I would return when I'm next in the hospital.  Vernell Morgans Chaplain

## 2021-01-04 NOTE — Progress Notes (Signed)
[name]   DOB:[birth year]   HY#:[id number]    ASSESSMENT & PLAN:  Severe hemolytic anemia She has good and appropriate reticulocytosis I spent a lot of time explaining the pathophysiology of hemolytic anemia to the patient and her husband It appears that she has good reticulocytosis but likely has significant peripheral destruction of blood While prednisone is helpful, I discussed with the patient and her husband the risks, benefits, side effects of additional IVIG She is in agreement to try I will order premedication before IVIG I plan to prescribe 1 g/kg x 2 days She will continue high-dose folic acid supplementation  We also have significant discussion about risk, benefits, side effects of blood transfusion The patient is not keen to have that repeated given her significant reaction to blood last night I told the patient not to exert herself too much over the next 2 days I think it is appropriate to keep her in the ICU for close monitoring for now  Recurrent fever Could be related to transfusion reaction versus undiagnosed infection Cultures are pending  Discharge planning She is not ready for discharge The risk of premature discharge is risk of death Recommend at least 1 more day of stepdown unit for close monitoring  All questions were answered. The patient knows to call the clinic with any problems, questions or concerns.   Artis Delay, MD 01/04/2021 9:46 AM  Subjective:  I was contacted by nursing staff many times yesterday over complications related to blood transfusion The patient developed recurrent high-grade fever with initiation of blood products despite premedication and subsequently, further attempts to restart blood transfusion was abandoned She was moved to stepdown unit due to high-grade fever and her severe anemia  This morning, right before I saw her, she walked a lap around the nursing station She developed significant shortness of breath at the end of her  lab but once she is settled in her bed, her breathing has returned back to normal Her husband is by her bedside She has no further documented fever once blood transfusion was discontinued She feels well today She denies pain The patient denies any recent signs or symptoms of bleeding such as spontaneous epistaxis, hematuria or hematochezia.  Objective:  Vitals:   01/04/21 0803 01/04/21 0806  BP: (!) 114/53   Pulse:  98  Resp:  17  Temp:    SpO2:  100%     Intake/Output Summary (Last 24 hours) at 01/04/2021 0946 Last data filed at 01/04/2021 0800 Gross per 24 hour  Intake 658.32 ml  Output 300 ml  Net 358.32 ml    GENERAL:alert, no distress and comfortable.  She looks pale and jaundiced SKIN: She appears jaundiced EYES: She have scleral icterus OROPHARYNX:no exudate, no erythema and lips, buccal mucosa, and tongue normal  NECK: supple, thyroid normal size, non-tender, without nodularity LYMPH:  no palpable lymphadenopathy in the cervical, axillary or inguinal LUNGS: clear to auscultation and percussion with normal breathing effort HEART: mildly tachycardic, no murmur ABDOMEN:abdomen soft, non-tender and normal bowel sounds.  No splenomegaly Musculoskeletal:no cyanosis of digits and no clubbing  NEURO: alert & oriented x 3 with fluent speech, no focal motor/sensory deficits   Labs:  Recent Labs    08/21/20 1519 01/02/21 1056 01/02/21 1102 01/03/21 0341 01/04/21 0244  NA 139 136  --  137 138  K 4.5 3.5  --  4.0 3.8  CL 102 107  --  109 109  CO2 22 17*  --  21* 21*  GLUCOSE 95 156*  --  107* 132*  BUN 8 20  --  18 16  CREATININE 0.51* 0.70  --  0.56 0.59  CALCIUM 10.1 9.3  --  8.5* 8.6*  GFRNONAA 96 >60  --  >60 >60  GFRAA 111  --   --   --   --   PROT 6.8 7.5  --  5.8* 5.9*  ALBUMIN 4.5 4.3  --  3.4* 3.3*  AST 19 33  --  29 27  ALT 19 25  --  20 24  ALKPHOS 61 60  --  46 45  BILITOT 0.3 6.5*  --  5.3* 2.3*  BILIDIR  --   --  0.4*  --   --     Studies:  CT  Abdomen Pelvis Wo Contrast  Result Date: 01/02/2021 CLINICAL DATA:  Acute onset abdominal and back pain 4 days ago. EXAM: CT ABDOMEN AND PELVIS WITHOUT CONTRAST TECHNIQUE: Multidetector CT imaging of the abdomen and pelvis was performed following the standard protocol without IV contrast. COMPARISON:  CT abdomen and pelvis 09/16/2020. FINDINGS: Lower chest: Mild dependent atelectasis. Lung bases otherwise clear. No pleural or pericardial effusion. Hepatobiliary: No focal liver abnormality is seen. No gallstones, gallbladder wall thickening, or biliary dilatation. Pancreas: Unremarkable. No pancreatic ductal dilatation or surrounding inflammatory changes. Spleen: Normal in size without focal abnormality. Adrenals/Urinary Tract: The adrenal glands appear normal. No renal or ureteral stones or hydronephrosis. Small cyst in the right kidney is noted but better seen on the patient's prior CT without IV contrast. Urinary bladder appears normal. Stomach/Bowel: Stomach is within normal limits. Negative for appendicitis. Small appendicolith noted. No evidence of bowel wall thickening, distention, or inflammatory changes. Vascular/Lymphatic: Aortic atherosclerosis. No enlarged abdominal or pelvic lymph nodes. Reproductive: Uterus and bilateral adnexa are unremarkable. Other: None. Musculoskeletal: No acute abnormality. 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 IMPRESSION: Negative for urinary tract stone. No acute abnormality or finding to explain the patient's symptoms. Aortic Atherosclerosis (ICD10-I70.0). Electronically Signed   By: Drusilla Kanner M.D.   On: 01/02/2021 13:25   DG Chest Port 1 View  Result Date: 01/02/2021 CLINICAL DATA:  Shortness of breath and abdominal pain. EXAM: PORTABLE CHEST 1 VIEW COMPARISON:  04/18/2019 FINDINGS: Lungs are adequately inflated and otherwise clear. Cardiomediastinal silhouette and remainder of the exam is unchanged. IMPRESSION: No active  disease. Electronically Signed   By: Elberta Fortis M.D.   On: 01/02/2021 12:30

## 2021-01-05 ENCOUNTER — Inpatient Hospital Stay (HOSPITAL_COMMUNITY): Payer: Medicare Other

## 2021-01-05 DIAGNOSIS — I639 Cerebral infarction, unspecified: Secondary | ICD-10-CM | POA: Diagnosis not present

## 2021-01-05 DIAGNOSIS — D649 Anemia, unspecified: Secondary | ICD-10-CM | POA: Diagnosis not present

## 2021-01-05 DIAGNOSIS — D591 Autoimmune hemolytic anemia, unspecified: Secondary | ICD-10-CM | POA: Diagnosis not present

## 2021-01-05 DIAGNOSIS — D72829 Elevated white blood cell count, unspecified: Secondary | ICD-10-CM | POA: Diagnosis not present

## 2021-01-05 DIAGNOSIS — D589 Hereditary hemolytic anemia, unspecified: Secondary | ICD-10-CM | POA: Diagnosis not present

## 2021-01-05 DIAGNOSIS — I82402 Acute embolism and thrombosis of unspecified deep veins of left lower extremity: Secondary | ICD-10-CM | POA: Diagnosis not present

## 2021-01-05 DIAGNOSIS — E039 Hypothyroidism, unspecified: Secondary | ICD-10-CM | POA: Diagnosis not present

## 2021-01-05 HISTORY — DX: Cerebral infarction, unspecified: I63.9

## 2021-01-05 LAB — GLUCOSE, CAPILLARY: Glucose-Capillary: 111 mg/dL — ABNORMAL HIGH (ref 70–99)

## 2021-01-05 LAB — COMPREHENSIVE METABOLIC PANEL
ALT: 23 U/L (ref 0–44)
AST: 22 U/L (ref 15–41)
Albumin: 3.1 g/dL — ABNORMAL LOW (ref 3.5–5.0)
Alkaline Phosphatase: 39 U/L (ref 38–126)
Anion gap: 7 (ref 5–15)
BUN: 19 mg/dL (ref 8–23)
CO2: 22 mmol/L (ref 22–32)
Calcium: 8.4 mg/dL — ABNORMAL LOW (ref 8.9–10.3)
Chloride: 108 mmol/L (ref 98–111)
Creatinine, Ser: 0.66 mg/dL (ref 0.44–1.00)
GFR, Estimated: >60 mL/min (ref >60)
Glucose, Bld: 125 mg/dL — ABNORMAL HIGH (ref 70–99)
Potassium: 4.1 mmol/L (ref 3.5–5.1)
Sodium: 137 mmol/L (ref 135–145)
Total Bilirubin: 1.1 mg/dL (ref 0.3–1.2)
Total Protein: 7.2 g/dL (ref 6.5–8.1)

## 2021-01-05 LAB — CBC WITH DIFFERENTIAL/PLATELET
Abs Immature Granulocytes: 0.72 10*3/uL — ABNORMAL HIGH (ref 0.00–0.07)
Basophils Absolute: 0 10*3/uL (ref 0.0–0.1)
Basophils Relative: 0 %
Eosinophils Absolute: 0 10*3/uL (ref 0.0–0.5)
Eosinophils Relative: 0 %
HCT: 13.5 % — ABNORMAL LOW (ref 36.0–46.0)
Hemoglobin: 4.3 g/dL — CL (ref 12.0–15.0)
Immature Granulocytes: 4 %
Lymphocytes Relative: 11 %
Lymphs Abs: 2.2 10*3/uL (ref 0.7–4.0)
MCH: 41.3 pg — ABNORMAL HIGH (ref 26.0–34.0)
MCHC: 31.9 g/dL (ref 30.0–36.0)
MCV: 129.8 fL — ABNORMAL HIGH (ref 80.0–100.0)
Monocytes Absolute: 1.1 10*3/uL — ABNORMAL HIGH (ref 0.1–1.0)
Monocytes Relative: 6 %
Neutro Abs: 15.5 10*3/uL — ABNORMAL HIGH (ref 1.7–7.7)
Neutrophils Relative %: 79 %
Platelets: 266 10*3/uL (ref 150–400)
RBC: 1.04 MIL/uL — ABNORMAL LOW (ref 3.87–5.11)
WBC: 19.5 10*3/uL — ABNORMAL HIGH (ref 4.0–10.5)
nRBC: 9.1 % — ABNORMAL HIGH (ref 0.0–0.2)

## 2021-01-05 LAB — HEMOGLOBIN A1C
Hgb A1c MFr Bld: 3.5 % — ABNORMAL LOW (ref 4.8–5.6)
Mean Plasma Glucose: 53.75 mg/dL

## 2021-01-05 LAB — LACTATE DEHYDROGENASE: LDH: 286 U/L — ABNORMAL HIGH (ref 98–192)

## 2021-01-05 LAB — RETICULOCYTES
Immature Retic Fract: 45.9 % — ABNORMAL HIGH (ref 2.3–15.9)
RBC.: 1.04 MIL/uL — ABNORMAL LOW (ref 3.87–5.11)
Retic Count, Absolute: 297.9 10*3/uL — ABNORMAL HIGH (ref 19.0–186.0)
Retic Ct Pct: 28.6 % — ABNORMAL HIGH (ref 0.4–3.1)

## 2021-01-05 LAB — PATHOLOGIST SMEAR REVIEW

## 2021-01-05 LAB — LIPID PANEL
Cholesterol: 158 mg/dL (ref 0–200)
HDL: 44 mg/dL (ref >40)
LDL Cholesterol: 100 mg/dL — ABNORMAL HIGH (ref 0–99)
Total CHOL/HDL Ratio: 3.6 RATIO
Triglycerides: 70 mg/dL (ref <150)
VLDL: 14 mg/dL (ref 0–40)

## 2021-01-05 MED ORDER — LACTATED RINGERS IV SOLN: INTRAVENOUS | Status: AC

## 2021-01-05 MED ORDER — LACTATED RINGERS IV BOLUS
500.0000 mL | Freq: Once | INTRAVENOUS | Status: AC
  Administered 2021-01-05: 500 mL via INTRAVENOUS

## 2021-01-05 MED ORDER — ASPIRIN EC 81 MG PO TBEC
81.0000 mg | DELAYED_RELEASE_TABLET | Freq: Every day | ORAL | Status: DC
  Administered 2021-01-06: 81 mg via ORAL
  Filled 2021-01-05: qty 1

## 2021-01-05 MED ORDER — ACETAMINOPHEN 325 MG PO TABS
650.0000 mg | ORAL_TABLET | Freq: Once | ORAL | Status: AC
  Administered 2021-01-05: 650 mg via ORAL
  Filled 2021-01-05: qty 2

## 2021-01-05 MED ORDER — SODIUM CHLORIDE 0.9% IV SOLUTION: Freq: Once | INTRAVENOUS | Status: DC

## 2021-01-05 MED ORDER — FENTANYL CITRATE (PF) 100 MCG/2ML IJ SOLN
INTRAMUSCULAR | Status: AC
  Filled 2021-01-05: qty 4

## 2021-01-05 MED ORDER — DIPHENHYDRAMINE HCL 25 MG PO CAPS
25.0000 mg | ORAL_CAPSULE | Freq: Once | ORAL | Status: AC
Start: 2021-01-05 — End: 2021-01-05
  Administered 2021-01-05: 25 mg via ORAL
  Filled 2021-01-05: qty 1

## 2021-01-05 MED ORDER — MIDAZOLAM HCL 2 MG/2ML IJ SOLN
INTRAMUSCULAR | Status: AC
  Filled 2021-01-05: qty 4

## 2021-01-05 NOTE — Progress Notes (Signed)
Follow up visit with Ms. [name] while rounding on unit.  [name] has had a stroke today and was very tearful when I saw her.  She reported having nightmares last night.  Chaplain offered ministry of presence and prayed at bedside.  Nurse reports Ms. [name] will be moved to [facility]. Will place referral with [facility]pl[facility].  [name] Chaplain

## 2021-01-05 NOTE — Progress Notes (Signed)
[name] is now down in the ICU.  She was brought down because of the close monitoring necessary due to her severe autoimmune hemolytic anemia.  She had a transfusion on Saturday.  As she appeared to have a reaction to the blood.  She obviously is going be very hard to crossmatch because of the autoantibodies.  She was started on IVIG.  She is on prednisone.  She is on folic acid.  Her reticulocyte count today is 28%.  This clearly is when the highest that I have ever seen.  Her white cell count is 19.5.  Platelet count 266,000.  Her creatinine is 0.66.  Her liver function studies are normal.  Her total bilirubin is down to 1.1.  This has to mean something.  She supposed to have a bone marrow biopsy.  This may not be done today.  I have to believe that the hemolysis will start to slow down.  I think 1 measurement, outside of the reticulocyte count, will be the LDH.  All of her vital signs are stable.  Her temperature is 97.7.  Pulse is 67.  Blood pressure is 106/45.  Oxygen saturations 98%.  There really is no change in her overall physical exam.  We really have to see about the bone marrow biopsy.  We will hold on transfusing her since it is apparently very difficult to do an adequate crossmatch.  This is definitely is a problem with autoimmune hemolysis.  I would like to hope that the prednisone and IVIG will start to work soon.  Depending on what the bone marrow biopsy shows, we may have to consider Rituxan on her.  I know that she will get outstanding care from all of the staff down in the ICU.   Lattie Haw, MD  Romans 3:28

## 2021-01-05 NOTE — Progress Notes (Addendum)
eLink Physician-Brief Progress Note Patient Name: [name] DOB: 1946-06-21 MRN: [id number]   Date of Service  01/05/2021  HPI/Events of Note  Received request for blood transfusion pre medication  eICU Interventions  Tylenol and Benadryl ordered  Further orders as per primary team. Patient is not referred to critical care.     Intervention Category Minor Interventions: Other:  [name] 01/05/2021, 8:07 PM

## 2021-01-05 NOTE — TOC Initial Note (Signed)
Transition of Care [facility]) - Initial/Assessment Note    Patient Details  Name: [name] MRN: [id number] Date of Birth: April 22, 1946  Transition of Care [facility]) CM/SW Contact:    Leeroy Cha, RN Phone Number: 01/05/2021, 8:10 AM  Clinical Narrative:                  75 year old female with past medical history significant for hypothyroidism and polyneuropathy who presented to the ER with generalized weakness. Upon ED evaluation she was found to be anemic with hemoglobin of 5.7 from 13. Elevated bilirubin, mostly indirect, elevated LDH and elevated D-dimer. CT abdomen and pelvis was normal with negative FOBT.Negative chest x-ray. Patient was admitted to the hospital for evaluation of hemolytic anemia. Hematology consulted currently on prednisone, famciclovir and Diflucan.   Assessment & Plan:   Active Problems:   Symptomatic anemia   Hemolytic anemia (HCC)   Autoimmune hemolytic anemia, POA Hematology recommendations appreciated.  Currently on prednisone 80 mg daily and folic acid 2 mg daily.  Mild transfusion reaction 4/16 - holding further transfusion for now per heme/onc - IVIG initiated 4/17 She needs to continue antifungal and antiviral prophylaxis.  Scheduled for bone marrow biopsy.  Reticulocyte count improving 14.7>>17.1>>21.8 PLAN: to return to home with husband who has dementia(early) has adult children to assist.  [id number] 4.3, wbc -19.5, iv immune gloibin being giving. Expected Discharge Plan: Home/Self Care Barriers to Discharge: Continued Medical Work up   Patient Goals and CMS Choice Patient states their goals for this hospitalization and ongoing recovery are:: to go home CMS Medicare.gov Compare Post Acute Care list provided to:: Patient Choice offered to / list presented to : Patient  Expected Discharge Plan and Services Expected Discharge Plan: Home/Self Care   Discharge Planning Services: CM Consult   Living arrangements for the past 2  months: Single Family Home                                      Prior Living Arrangements/Services Living arrangements for the past 2 months: Single Family Home Lives with:: Spouse Patient language and need for interpreter reviewed:: Yes Do you feel safe going back to the place where you live?: Yes      Need for Family Participation in Patient Care: Yes (Comment) Care giver support system in place?: Yes (comment)   Criminal Activity/Legal Involvement Pertinent to Current Situation/Hospitalization: No - Comment as needed  Activities of Daily Living Home Assistive Devices/Equipment: Eyeglasses,Grab bars in shower ADL Screening (condition at time of admission) Patient's cognitive ability adequate to safely complete daily activities?: Yes Is the patient deaf or have difficulty hearing?: No Does the patient have difficulty seeing, even when wearing glasses/contacts?: No Does the patient have difficulty concentrating, remembering, or making decisions?: No Patient able to express need for assistance with ADLs?: Yes Does the patient have difficulty dressing or bathing?: No Independently performs ADLs?: Yes (appropriate for developmental age) Does the patient have difficulty walking or climbing stairs?: No Weakness of Legs: None Weakness of Arms/Hands: None  Permission Sought/Granted                  Emotional Assessment Appearance:: Appears stated age Attitude/Demeanor/Rapport: Engaged Affect (typically observed): Anxious Orientation: : Oriented to Self,Oriented to Place,Oriented to  Time,Oriented to Situation Alcohol / Substance Use: Not Applicable Psych Involvement: No (comment)  Admission diagnosis:  Hyperbilirubinemia [E80.6] Generalized abdominal pain [R10.84] Symptomatic anemia [  D64.9] Leukocytosis, unspecified type [D72.829] Hemolytic anemia ([place]) [D58.9] Patient Active Problem List   Diagnosis Date Noted  . Hemolytic anemia ([place]) 01/03/2021  . Symptomatic  anemia 01/02/2021  . 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 Polyneuropathy 08/22/2020  . 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 PVC (premature ventricular contraction) 07/11/2013  . Dysphagia   . HYPERTHYROIDISM 07/17/2010  . MIGRAINE HEADACHE 07/17/2010  . LYMPHADENOPATHY 07/17/2010   PCP:  Lorene Dy, MD Pharmacy:   [facility] [place], [place] [address] Phone: [phone] Fax: [phone]     Social Determinants of Health (SDOH) Interventions    Readmission Risk Interventions No flowsheet data found.

## 2021-01-05 NOTE — Progress Notes (Signed)
Patient transported to CT for bone marrow biopsy by primary RN, Haywood Pao., and charge nurse, Royston Cowper. Upon arrival to CT, patient developed slurred speech (different from baseline which was last assessed by primary RN at 8:15AM).  Charge nurse, Royston Cowper, performed NIH Stroke Scale and notified Dr. Avon Gully.  Patient alert and oriented x3. On 4 L [place], sating 100%. BP stable. CBG 111. EKG NSR w/nonspecific ST abnormality. Dr. Avon Gully arrived to CT and decided to order STAT MRI brain and to cancel bone marrow biopsy for the time being. Dr. Avon Gully to reach out to neurology if needed. Pending MRI.

## 2021-01-05 NOTE — Sedation Documentation (Signed)
Pt arrived to CT for BM Bx accompanied by nurse. Pt with expressive aphagia, unable to follow commands. Bone marrow biopsy canceled.

## 2021-01-05 NOTE — Consult Note (Signed)
Neurology Consultation  Reason for Consult: Acute dysarthria Referring Physician: Dr. Avon Gully  CC: Acute dysarthria  History is obtained from: Patient, Chart review  HPI: [name] is a 75 y.o. female with a medical history significant for dysphagia, hyperthyroidism, polyneuropathy, and migraines with recent admission to [facility] for evaluation of generalized weakness and dizziness and found to have a leukocytosis with a WBC of 24.7K and a hemoglobin of 5.7. 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 She received a blood transfusion on 4/16 but unfortunately had a transfusion reaction. On 4/18, she was being taken for a bone marrow biopsy to evaluate for a marrow malignancy when she became acutely aphasic and dysarthric and the procedure was cancelled for brain imaging. MRI brain revealed multiple acute infarcts and neurology was consulted for further evaluation and management with plans for patient to transfer to [facility].   LKW: 08:15 tpa given?: no, acute hemolytic anemia with hemoglobin of 4.3 IR Thrombectomy? No, low suspicion for LVO mRS: 1  ROS: A complete ROS was performed and is negative except as noted in the HPI.   Past Medical History:  Diagnosis Date  . Dysphagia   . HYPERTHYROIDISM   . LYMPHADENOPATHY   . MIGRAINE HEADACHE   . Neuromuscular disorder ([place])    condition where muscle separates from bone left chest, quarter size, pain intermittent   Past Surgical History:  Procedure Laterality Date  . BALLOON DILATION N/A 09/18/2020   Procedure: BALLOON DILATION;  Surgeon: Ronnette Juniper, MD;  Location: [facility];  Service: Gastroenterology;  Laterality: N/A;  . ESOPHAGOGASTRODUODENOSCOPY (EGD) WITH PROPOFOL N/A 09/18/2020   Procedure: ESOPHAGOGASTRODUODENOSCOPY (EGD) WITH PROPOFOL;  Surgeon: Ronnette Juniper, MD;  Location: [facility];  Service: Gastroenterology;  Laterality: N/A;  . EYE SURGERY Bilateral    Cataract L eye  1-20, R eye 3-20   . FOREIGN BODY REMOVAL  09/18/2020   Procedure: FOREIGN BODY REMOVAL;  Surgeon: Ronnette Juniper, MD;  Location: [facility];  Service: Gastroenterology;;  . [place]  2001  . REVERSE SHOULDER ARTHROPLASTY Right 03/16/2019   Procedure: REVERSE SHOULDER ARTHROPLASTY;  Surgeon: Netta Cedars, MD;  Location: [facility];  Service: Orthopedics;  Laterality: Right;  interscalene block  . TUBAL LIGATION     Family History  Problem Relation Age of Onset  . Heart disease Mother   . Stroke Father   . Arthritis Sister   . Diabetes Brother   . Arthritis Brother   . Neuropathy Brother   . Neuropathy Sister   . Diabetes Brother    Social History:   reports that she quit smoking about 53 years ago. She has never used smokeless tobacco. She reports that she does not drink alcohol and does not use drugs.  Medications  Current Facility-Administered Medications:  .  Chlorhexidine Gluconate Cloth 2 % PADS 6 each, 6 each, Topical, Daily, Patrecia Pour, Christean Grief, MD, 6 each at 01/04/21 1108 .  famciclovir [facility]) tablet 500 mg, 500 mg, Oral, Daily, Volanda Napoleon, MD, 500 mg at 01/04/21 1025 .  fluconazole (DIFLUCAN) tablet 100 mg, 100 mg, Oral, Daily, Volanda Napoleon, MD, 100 mg at 01/04/21 1019 .  folic acid (FOLVITE) tablet 2 mg, 2 mg, Oral, Daily, Ennever, Rudell Cobb, MD, 2 mg at 01/04/21 1019 .  gabapentin (NEURONTIN) capsule 300 mg, 300 mg, Oral, 2 times per day, 300 mg at 01/04/21 1651 **AND** gabapentin (NEURONTIN) capsule 600 mg, 600 mg, Oral, QHS, Green, Terri L, RPH, 600 mg  at 01/04/21 2100 .  HYDROcodone-acetaminophen (NORCO/VICODIN) 5-325 MG per tablet 1-2 tablet, 1-2 tablet, Oral, Q6H PRN, Marylyn Ishihara, Tyrone A, DO, 1 tablet at 01/05/21 1357 .  levothyroxine (SYNTHROID) tablet 112 mcg, 112 mcg, Oral, Q0600, Cherylann Ratel A, DO, 112 mcg at 01/05/21 7035 .  LORazepam (ATIVAN) tablet 2 mg, 2 mg, Oral, TID PRN, Marylyn Ishihara, Tyrone A, DO, 2 mg at 01/05/21 0320 .  MEDLINE mouth rinse, 15 mL, Mouth  Rinse, BID, Patrecia Pour, Christean Grief, MD, 15 mL at 01/04/21 2100 .  multivitamin with minerals tablet 1 tablet, 1 tablet, Oral, Daily, Kyle, Tyrone A, DO, 1 tablet at 01/04/21 1019 .  pantoprazole (PROTONIX) EC tablet 40 mg, 40 mg, Oral, BID, Volanda Napoleon, MD, 40 mg at 01/04/21 2100 .  polycarbophil (FIBERCON) tablet 1,250 mg, 1,250 mg, Oral, Q lunch, Kyle, Tyrone A, DO, 1,250 mg at 01/05/21 1402 .  predniSONE (DELTASONE) tablet 80 mg, 80 mg, Oral, Q breakfast, Volanda Napoleon, MD, 80 mg at 01/05/21 0811 .  sertraline (ZOLOFT) tablet 50 mg, 50 mg, Oral, Daily, Kyle, Tyrone A, DO, 50 mg at 01/04/21 1019 .  sucralfate (CARAFATE) tablet 1 g, 1 g, Oral, BID, Kyle, Tyrone A, DO, 1 g at 01/05/21 0093  Exam: Current vital signs: BP (!) 118/51   Pulse 86   Temp 98.6 F (37 C) (Axillary)   Resp (!) 22   Ht 5' 5"  (1.651 m)   Wt 72.6 kg   SpO2 97%   BMI 26.63 kg/m  Vital signs in last 24 hours: Temp:  [97.5 F (36.4 C)-98.9 F (37.2 C)] 98.6 F (37 C) (04/18 1200) Pulse Rate:  [62-95] 86 (04/18 1515) Resp:  [13-26] 22 (04/18 1515) BP: (90-149)/(37-111) 118/51 (04/18 1515) SpO2:  [84 %-100 %] 97 % (04/18 1515)  GENERAL: Awake, alert, sitting up in bed in no acute distress Head: Normocephalic and atraumatic without obvious abnormality EENT: No OP obstruction, normal conjunctivae LUNGS: Normal respiratory effort, non-labored breathing. Patient does become short of breath during physical examination.  CV: Regular rate on tele, extremities warm with 1+ non-pitting edema ABDOMEN: Soft, nontender Ext: warm, well perfused, without obvious deformity  NEURO:  Mental Status: Awake, alert, and oriented to self Speech/Language: speech is moderately dysarthric with some stuttering speech. No aphasia noted during examination but with patient and RN complaints of word finding difficulties throughout the day.  Naming, repetition, fluency, and comprehension intact. Cranial Nerves:  II: PERRL 3 mm/brisk.  Visual fields full.  III, IV, VI: EOMI. Lid elevation symmetric and full.  V: Sensation is intact to light touch and symmetrical to face.  VII: Face is symmetric resting and smiling. Able to puff cheeks and raise eyebrows.  VIII: Hearing is intact to voice IX, X: Palate elevation is symmetric. Phonation normal.  XI: Normal sternocleidomastoid and trapezius muscle strength XII: Tongue protrudes midline without fasciculations.   Motor: 5/5 strength is all muscle groups with antigravity movements without vertical drift. Tone is normal. Bulk is normal.  Sensation: Intact to light touch bilaterally in all four extremities. No extinction to DSS present.  Coordination: FTN intact bilaterally. HKS intact bilaterally. No pronator drift.  DTRs: 2+ throughout.  Plantars: downgoing bilaterally Gait: Deferred  NIHSS: 1a Level of Conscious.: 0 1b LOC Questions: 0 1c LOC Commands: 0 2 Best Gaze: 0 3 Visual: 0 4 Facial Palsy: 0 5a Motor Arm - left: 0 5b Motor Arm - Right: 0 6a Motor Leg - Left: 0 6b Motor Leg - Right: 0 7  Limb Ataxia: 0 8 Sensory: 0 9 Best Language: 0 10 Dysarthria: 2 11 Extinct. and Inatten.: 0 TOTAL: 2  Labs I have reviewed labs in epic and the results pertinent to this consultation are: CBC    Component Value Date/Time   WBC 19.5 (H) 01/05/2021 0307   RBC 1.04 (L) 01/05/2021 0307   RBC 1.04 (L) 01/05/2021 0307   HGB 4.3 (LL) 01/05/2021 0307   HGB 13.8 08/21/2020 1519   HCT 13.5 (L) 01/05/2021 0307   HCT 40.6 08/21/2020 1519   PLT 266 01/05/2021 0307   PLT 388 08/21/2020 1519   MCV 129.8 (H) 01/05/2021 0307   MCV 95 08/21/2020 1519   MCH 41.3 (H) 01/05/2021 0307   MCHC 31.9 01/05/2021 0307   RDW Not Measured 01/05/2021 0307   RDW 12.0 08/21/2020 1519   LYMPHSABS 2.2 01/05/2021 0307   MONOABS 1.1 (H) 01/05/2021 0307   EOSABS 0.0 01/05/2021 0307   BASOSABS 0.0 01/05/2021 0307   CMP     Component Value Date/Time   NA 137 01/05/2021 0307   NA 139  08/21/2020 1519   K 4.1 01/05/2021 0307   CL 108 01/05/2021 0307   CO2 22 01/05/2021 0307   GLUCOSE 125 (H) 01/05/2021 0307   BUN 19 01/05/2021 0307   BUN 8 08/21/2020 1519   CREATININE 0.66 01/05/2021 0307   CALCIUM 8.4 (L) 01/05/2021 0307   PROT 7.2 01/05/2021 0307   PROT 6.8 08/21/2020 1519   ALBUMIN 3.1 (L) 01/05/2021 0307   ALBUMIN 4.5 08/21/2020 1519   AST 22 01/05/2021 0307   ALT 23 01/05/2021 0307   ALKPHOS 39 01/05/2021 0307   BILITOT 1.1 01/05/2021 0307   BILITOT 0.3 08/21/2020 1519   GFRNONAA >60 01/05/2021 0307   GFRAA 111 08/21/2020 1519   Lipid Panel  No results found for: CHOL, TRIG, HDL, CHOLHDL, VLDL, LDLCALC, LDLDIRECT   Imaging I have reviewed the images obtained:  MRI Brain 4/18: Acute infarcts of the right parietotemporal lobes greater than left frontal lobe. Additional punctate left parietal and right occipital acute infarcts. Mild chronic microvascular ischemic changes. Few chronic microhemorrhages.  Assessment: 75 year old female with history as above with evidence of acute infarcts on MRI brain imaging following acute onset of dysarthria while inpatient today. - Examination reveals patient with dysarthria and stuttering but without evidence of aphasia on examination. Patient with complaints of intermittent word finding difficulties throughout the day on Monday.  - tPA inappropriate due to low hemoglobin levels and suspected hemolytic anemia. Imaging without concern for hypoxic ischemic injury, imaging more consistent with embolic etiology of strokes. - Vessel imaging and further work up pending, patient transfer to [facility] pending. Stroke work up pending.  - MRI shows several acute and subacute ischemic infarctions in separate vascular territories, suggestive of a cardioembolic etiology. The infarct locations are; right parietotemporal, left frontal, punctate left parietal, punctate right occipital.  Recommendations: - Vessel imaging: MRA head -  Carotid duplex US- will need order when at [facility] - HgbA1c, fasting lipid panel - Frequent neuro checks - Transthoracic Echocardiogram- will likely need TEE if TTE is negative.  - Prophylactic therapy- Antiplatelet med:  - Risk factor modification - Telemetry monitoring - PT consult, OT consult, Speech consult - Stroke team to follow  Pt seen by NP/Neuro Anibal Henderson, AGAC-NP [facility] Pager: [phone]  I have seen and examined the patient. I have amended the assessment and recommendations.  75 year old female with with evidence of acute infarcts in separate  vascular territories on MRI brain imaging. Assessment and recommendations as above.  Electronically signed: Dr. Kerney Elbe

## 2021-01-05 NOTE — Progress Notes (Signed)
PROGRESS NOTE    [name]  VEH:[id number] DOB: 1945-11-21 DOA: 01/02/2021 PCP: Lorene Dy, MD   Brief Narrative:  [name] is a 75 year old female with past medical history significant for hypothyroidism and polyneuropathy who presented to the ER with generalized weakness.  Upon ED evaluation she was found to be anemic with hemoglobin of 5.7 from 13.  Elevated bilirubin, mostly indirect, elevated LDH and elevated D-dimer. CT abdomen and pelvis was normal with negative FOBT.  Negative chest x-ray.  Patient was admitted to the hospital for evaluation of hemolytic anemia.  Hematology consulted currently on prednisone, famciclovir and Diflucan.   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   - Scheduled for bone marrow biopsy on the 18th this was canceled in the setting of acute stroke, will need to reattempt in the next 24 to 48 hours once patient is more stable.   - Reticulocyte count improving appropriately with treatment 14.7>>17.1>>21.8>>28.6  Acute ischemic stroke in the setting of above Cannot rule out concurrent embolic disease -On the morning of 01/05/2021 while in route to CT scanner for bone marrow biopsy patient had mental status changes with slurred speech and confusion. -Code stroke was called, discussed with teleneurology, MRI  shows - "Acute infarcts of the right parietotemporal lobes greater than left frontal lobe. Additional punctate left parietal and right occipital acute infarcts" -Neurology consulted given atypical appearance, concern for embolic disease transfer to [facility] main hospital for further evaluation with neurology, blood cultures pending, echo, MRA pending.  May require TEE pending cultures versus other findings.  Family updated at bedside. -Hold off on pressors --> small boluses of normal saline to keep MAP greater than 65 per discussion with neurology.  Hypothyroidism  Continue Synthroid at current dose.  Anxiety  Continue Zoloft at home dose.  Ativan as needed for anxiety.  Polyneuropathy Continue gabapentin  DVT prophylaxis: SCD's  Code Status: Full  Family Communication: Husband updated over the phone  Status is: inpt  Dispo: The patient is from: home              Anticipated d/c is to: home              Anticipated d/c date is: >72h              Patient currently NOT medically stable for discharge  Consultants:   Heme/Onc  Procedures:   None  Antimicrobials:  Fluconazole, famcyclovir ongoing  Subjective: Acute onset slurred speech this morning, somewhat more responsive later this morning much more appropriate but still not yet back to baseline.  Review of systems somewhat limited given patient's mental status.  Objective: Vitals:   01/05/21 0500 01/05/21 0600 01/05/21 0700 01/05/21 0720  BP: 113/67 (!) 94/41 (!) 91/44 (!) 106/45  Pulse: 87 69 77 67  Resp: (!) 26 (!) 22 (!) 21 20  Temp:      TempSrc:      SpO2: 99% 98% 100% 98%  Weight:  Height:        Intake/Output Summary (Last 24 hours) at 01/05/2021 0721 Last data filed at 01/05/2021 0400 Gross per 24 hour  Intake 1899.04 ml  Output --  Net 1899.04 ml   Filed Weights   01/02/21 1035  Weight: 72.6 kg    Examination:  General exam: Somewhat pallorous, confused, unable to orient Respiratory system:  Clear to auscultation. Respiratory effort normal. Cardiovascular system: S1 & S2 heard, RRR. No JVD, murmurs, rubs, gallops or clicks. No pedal edema. Gastrointestinal system: Abdomen is nondistended, soft and nontender. No organomegaly or masses felt. Normal bowel sounds heard. Central nervous system: Awake, disoriented unable to appropriately answer orientation questions; slurred speech but without focal deficit; some difficulty following simple commands Extremities: Symmetric 5 x 5 power bilateral upper and lower extremities. Skin: No rashes, lesions or ulcers  Data Reviewed: I have personally reviewed following labs and imaging studies  CBC: Recent Labs  Lab 01/02/21 1056 01/02/21 1400 01/02/21 1612 01/03/21 0341 01/04/21 0244 01/05/21 0307  WBC 24.7*  --   --  15.8* 19.3* 19.5*  NEUTROABS 20.6*  --   --   --   --  PENDING  HGB 5.7*  --  5.3* 4.3* 4.3* 4.3*  HCT 15.9*  --  15.3* 12.9* 13.2* 13.5*  MCV 109.7*  --   --  112.2* 116.8* 129.8*  PLT 522* 451*  --  383 339 270   Basic Metabolic Panel: Recent Labs  Lab 01/02/21 1056 01/03/21 0341 01/04/21 0244 01/05/21 0307  NA 136 137 138 137  K 3.5 4.0 3.8 4.1  CL 107 109 109 108  CO2 17* 21* 21* 22  GLUCOSE 156* 107* 132* 125*  BUN 20 18 16 19   CREATININE 0.70 0.56 0.59 0.66  CALCIUM 9.3 8.5* 8.6* 8.4*   GFR: Estimated Creatinine Clearance: 63.4 mL/min (by C-G formula based on SCr of 0.66 mg/dL). Liver Function Tests: Recent Labs  Lab 01/02/21 1056 01/03/21 0341 01/04/21 0244 01/05/21 0307  AST 33 29 27 22   ALT 25 20 24 23   ALKPHOS 60 46 45 39  BILITOT 6.5* 5.3* 2.3* 1.1  PROT 7.5 5.8* 5.9* 7.2  ALBUMIN 4.3 3.4* 3.3* 3.1*   Recent Labs  Lab 01/02/21 1056  LIPASE 37   No results for input(s): AMMONIA in the last 168 hours. Coagulation Profile: Recent Labs  Lab 01/02/21 1400  INR 1.1   Cardiac Enzymes: No results for input(s): CKTOTAL, CKMB, CKMBINDEX, TROPONINI in the last 168 hours. BNP (last 3  results) No results for input(s): PROBNP in the last 8760 hours. HbA1C: No results for input(s): HGBA1C in the last 72 hours. CBG: No results for input(s): GLUCAP in the last 168 hours. Lipid Profile: No results for input(s): CHOL, HDL, LDLCALC, TRIG, CHOLHDL, LDLDIRECT in the last 72 hours. Thyroid Function Tests: No results for input(s): TSH, T4TOTAL, FREET4, T3FREE, THYROIDAB in the last 72 hours. Anemia Panel: Recent Labs    01/02/21 1612 01/03/21 0341 01/04/21 0244 01/05/21 0307  VITAMINB12 422  --   --   --   FOLATE 39.0  --   --   --   FERRITIN 1,354*  --   --   --   TIBC 344  --   --   --   IRON 213*  --   --   --   RETICCTPCT 14.7*   < > 21.8* 28.6*   < > = values in this interval not displayed.   Sepsis Labs: Recent Labs  Lab 01/02/21 1612 01/02/21 1851  LATICACIDVEN 2.5* 3.2*    Recent Results (from the past 240 hour(s))  Resp Panel by RT-PCR (Flu A&B, Covid) Nasopharyngeal Swab     Status: None   Collection Time: 01/02/21 12:15 PM   Specimen: Nasopharyngeal Swab; Nasopharyngeal(NP) swabs in vial transport medium  Result Value Ref Range Status   SARS Coronavirus 2 by RT PCR NEGATIVE NEGATIVE Final    Comment: (NOTE) SARS-CoV-2 target nucleic acids are NOT DETECTED.  The SARS-CoV-2 RNA is generally detectable in upper respiratory specimens during the acute phase of infection. The lowest concentration of SARS-CoV-2 viral copies this assay can detect is 138 copies/mL. A negative result does not preclude SARS-Cov-2 infection and should not be used as the sole basis for treatment or other patient management decisions. A negative result may occur with  improper specimen collection/handling, submission of specimen other than nasopharyngeal swab, presence of viral mutation(s) within the areas targeted by this assay, and inadequate number of viral copies(<138 copies/mL). A negative result must be combined with clinical observations, patient history, and  epidemiological information. The expected result is Negative.  Fact Sheet for Patients:  EntrepreneurPulse.com.au  Fact Sheet for Healthcare Providers:  IncredibleEmployment.be  This test is no t yet approved or cleared by the [place] FDA and  has been authorized for detection and/or diagnosis of SARS-CoV-2 by FDA under an Emergency Use Authorization (EUA). This EUA will remain  in effect (meaning this test can be used) for the duration of the COVID-19 declaration under Section 564(b)(1) of the Act, 21 U.S.C.section 360bbb-3(b)(1), unless the authorization is terminated  or revoked sooner.       Influenza A by PCR NEGATIVE NEGATIVE Final   Influenza B by PCR NEGATIVE NEGATIVE Final    Comment: (NOTE) The Xpert Xpress SARS-CoV-2/FLU/RSV plus assay is intended as an aid in the diagnosis of influenza from Nasopharyngeal swab specimens and should not be used as a sole basis for treatment. Nasal washings and aspirates are unacceptable for Xpert Xpress SARS-CoV-2/FLU/RSV testing.  Fact Sheet for Patients: EntrepreneurPulse.com.au  Fact Sheet for Healthcare Providers: IncredibleEmployment.be  This test is not yet approved or cleared by the [place] FDA and has been authorized for detection and/or diagnosis of SARS-CoV-2 by FDA under an Emergency Use Authorization (EUA). This EUA will remain in effect (meaning this test can be used) for the duration of the COVID-19 declaration under Section 564(b)(1) of the Act, 21 U.S.C. section 360bbb-3(b)(1), unless the authorization is terminated or revoked.  Performed at [facility], [place] [address] [place], [place] [address]   MRSA PCR Screening     Status: None   Collection Time: 01/03/21  6:24 PM   Specimen: Nasal Mucosa; Nasopharyngeal  Result Value Ref Range Status   MRSA by PCR NEGATIVE NEGATIVE Final    Comment:        The  GeneXpert MRSA Assay (FDA approved for NASAL specimens only), is one component of a comprehensive MRSA colonization surveillance program. It is not intended to diagnose MRSA infection nor to guide or monitor treatment for MRSA infections. Performed at [facility], [place] [address] [place], [place] [address]     Radiology Studies: No results found.  Scheduled Meds: . Chlorhexidine Gluconate Cloth  6 each Topical Daily  . diphenhydrAMINE  25 mg Oral Q24H  . famciclovir  500 mg Oral Daily  . famotidine  20 mg Oral Q24H  . fluconazole  100 mg Oral Daily  . folic  acid  2 mg Oral Daily  . gabapentin  300 mg Oral 2 times per day   And  . gabapentin  600 mg Oral QHS  . levothyroxine  112 mcg Oral Q0600  . mouth rinse  15 mL Mouth Rinse BID  . multivitamin with minerals  1 tablet Oral Daily  . pantoprazole  40 mg Oral BID  . polycarbophil  1,250 mg Oral Q lunch  . predniSONE  80 mg Oral Q breakfast  . sertraline  50 mg Oral Daily  . sucralfate  1 g Oral BID   Continuous Infusions: . IMMUNE GLOBULIN 10% (HUMAN) IV - For Fluid Restriction Only Stopped (01/05/21 0200)  . lactated ringers 75 mL/hr at 01/05/21 0400     LOS: 2 days   Time spent: 66mn  Quin Mathenia C Breleigh Carpino, DO [facility]  If 7PM-7AM, please contact night-coverage [email or web address]  01/05/2021, 7:21 AM

## 2021-01-06 ENCOUNTER — Inpatient Hospital Stay (HOSPITAL_COMMUNITY): Payer: Medicare Other

## 2021-01-06 DIAGNOSIS — I639 Cerebral infarction, unspecified: Secondary | ICD-10-CM | POA: Diagnosis not present

## 2021-01-06 DIAGNOSIS — I63413 Cerebral infarction due to embolism of bilateral middle cerebral arteries: Secondary | ICD-10-CM | POA: Diagnosis not present

## 2021-01-06 DIAGNOSIS — I6330 Cerebral infarction due to thrombosis of unspecified cerebral artery: Secondary | ICD-10-CM | POA: Insufficient documentation

## 2021-01-06 DIAGNOSIS — D72829 Elevated white blood cell count, unspecified: Secondary | ICD-10-CM | POA: Diagnosis not present

## 2021-01-06 DIAGNOSIS — D649 Anemia, unspecified: Secondary | ICD-10-CM | POA: Diagnosis not present

## 2021-01-06 DIAGNOSIS — D591 Autoimmune hemolytic anemia, unspecified: Secondary | ICD-10-CM | POA: Diagnosis not present

## 2021-01-06 DIAGNOSIS — E039 Hypothyroidism, unspecified: Secondary | ICD-10-CM | POA: Diagnosis not present

## 2021-01-06 DIAGNOSIS — D589 Hereditary hemolytic anemia, unspecified: Secondary | ICD-10-CM | POA: Diagnosis not present

## 2021-01-06 DIAGNOSIS — I6340 Cerebral infarction due to embolism of unspecified cerebral artery: Secondary | ICD-10-CM | POA: Insufficient documentation

## 2021-01-06 DIAGNOSIS — I6389 Other cerebral infarction: Secondary | ICD-10-CM

## 2021-01-06 DIAGNOSIS — I82402 Acute embolism and thrombosis of unspecified deep veins of left lower extremity: Secondary | ICD-10-CM | POA: Diagnosis not present

## 2021-01-06 LAB — BPAM RBC
Blood Product Expiration Date: 202205172359
Blood Product Expiration Date: 202205192359
ISSUE DATE / TIME: 202204161258
Unit Type and Rh: 600
Unit Type and Rh: 600

## 2021-01-06 LAB — BLOOD CULTURE ID PANEL (REFLEXED) - BCID2

## 2021-01-06 LAB — TYPE AND SCREEN
ABO/RH(D): A NEG
Antibody Screen: POSITIVE
DAT, IgG: POSITIVE
PT AG Type: NEGATIVE
Unit division: 0
Unit division: 0

## 2021-01-06 LAB — COMPREHENSIVE METABOLIC PANEL
ALT: 31 U/L (ref 0–44)
AST: 31 U/L (ref 15–41)
Albumin: 3.1 g/dL — ABNORMAL LOW (ref 3.5–5.0)
Alkaline Phosphatase: 44 U/L (ref 38–126)
Anion gap: 6 (ref 5–15)
BUN: 19 mg/dL (ref 8–23)
CO2: 22 mmol/L (ref 22–32)
Calcium: 8.4 mg/dL — ABNORMAL LOW (ref 8.9–10.3)
Chloride: 109 mmol/L (ref 98–111)
Creatinine, Ser: 0.65 mg/dL (ref 0.44–1.00)
GFR, Estimated: >60 mL/min (ref >60)
Glucose, Bld: 131 mg/dL — ABNORMAL HIGH (ref 70–99)
Potassium: 3.9 mmol/L (ref 3.5–5.1)
Sodium: 137 mmol/L (ref 135–145)
Total Bilirubin: 0.9 mg/dL (ref 0.3–1.2)
Total Protein: 8.4 g/dL — ABNORMAL HIGH (ref 6.5–8.1)

## 2021-01-06 LAB — CBC
HCT: 13.5 % — ABNORMAL LOW (ref 36.0–46.0)
Hemoglobin: 4.5 g/dL — CL (ref 12.0–15.0)
MCH: 48.4 pg — ABNORMAL HIGH (ref 26.0–34.0)
MCHC: 33.3 g/dL (ref 30.0–36.0)
MCV: 145.2 fL — ABNORMAL HIGH (ref 80.0–100.0)
Platelets: 234 10*3/uL (ref 150–400)
RBC: 0.93 MIL/uL — ABNORMAL LOW (ref 3.87–5.11)
WBC: 16.3 10*3/uL — ABNORMAL HIGH (ref 4.0–10.5)
nRBC: 20.3 % — ABNORMAL HIGH (ref 0.0–0.2)

## 2021-01-06 LAB — ECHOCARDIOGRAM COMPLETE
Area-P 1/2: 2.29 cm2
Height: 65 in
S' Lateral: 2.7 cm
Weight: 2560 oz

## 2021-01-06 LAB — RETICULOCYTES
Immature Retic Fract: 42.2 % — ABNORMAL HIGH (ref 2.3–15.9)
RBC.: 1.14 MIL/uL — ABNORMAL LOW (ref 3.87–5.11)
Retic Count, Absolute: 364 10*3/uL — ABNORMAL HIGH (ref 19.0–186.0)
Retic Ct Pct: >30 % — ABNORMAL HIGH (ref 0.4–3.1)

## 2021-01-06 LAB — HEMOGLOBIN AND HEMATOCRIT, BLOOD
HCT: 21 % — ABNORMAL LOW (ref 36.0–46.0)
Hemoglobin: 7.5 g/dL — ABNORMAL LOW (ref 12.0–15.0)

## 2021-01-06 LAB — PREPARE RBC (CROSSMATCH)

## 2021-01-06 LAB — LACTATE DEHYDROGENASE: LDH: 319 U/L — ABNORMAL HIGH (ref 98–192)

## 2021-01-06 MED ORDER — DIPHENHYDRAMINE HCL 25 MG PO CAPS
25.0000 mg | ORAL_CAPSULE | Freq: Once | ORAL | Status: AC
  Administered 2021-01-06: 25 mg via ORAL
  Filled 2021-01-06: qty 1

## 2021-01-06 MED ORDER — FAMOTIDINE 20 MG PO TABS
40.0000 mg | ORAL_TABLET | Freq: Two times a day (BID) | ORAL | Status: AC
  Administered 2021-01-06 (×2): 40 mg via ORAL
  Filled 2021-01-06 (×2): qty 2

## 2021-01-06 MED ORDER — ACETAMINOPHEN 325 MG PO TABS
650.0000 mg | ORAL_TABLET | Freq: Once | ORAL | Status: AC
  Administered 2021-01-06: 650 mg via ORAL
  Filled 2021-01-06: qty 2

## 2021-01-06 MED ORDER — SODIUM CHLORIDE 0.9% IV SOLUTION: Freq: Once | INTRAVENOUS | Status: AC

## 2021-01-06 NOTE — Progress Notes (Signed)
OT Cancellation Note  Patient Details Name: [name] MRN: [id number] DOB: 07-07-1946   Cancelled Treatment:    Reason Eval/Treat Not Completed: Medical issues which prohibited therapy. Upon chart review note patient with multiple acute infarcts with transfer order for [facility] ICU and hemoglobin of 4.5. Will hold off on OT eval today, will check back if patient still at [facility] 4/20  Marlyce Huge OT OT pager: [phone]   Carmelia Roller 01/06/2021, 6:43 AM

## 2021-01-06 NOTE — Progress Notes (Signed)
  Echocardiogram 2D Echocardiogram has been performed.  Augustine Radar 01/06/2021, 4:03 PM

## 2021-01-06 NOTE — Progress Notes (Signed)
Carotid duplex completed.  Venous duplex completed and preliminary findings given to Dr. Natale Milch.  Results can be found under chart review under CV PROC. 01/06/2021 10:58 AM [name] RVT, RDMS

## 2021-01-06 NOTE — Evaluation (Addendum)
Clinical/Bedside Swallow Evaluation Patient Details  Name: [name] MRN: [id number] Date of Birth: 10/09/1945  Today's Date: 01/06/2021 Time: SLP Start Time (ACUTE ONLY): 63 SLP Stop Time (ACUTE ONLY): 1135 SLP Time Calculation (min) (ACUTE ONLY): 30 min  Past Medical History:  Past Medical History:  Diagnosis Date  . Dysphagia   . HYPERTHYROIDISM   . LYMPHADENOPATHY   . MIGRAINE HEADACHE   . Neuromuscular disorder ([place])    condition where muscle separates from bone left chest, quarter size, pain intermittent   Past Surgical History:  Past Surgical History:  Procedure Laterality Date  . BALLOON DILATION N/A 09/18/2020   Procedure: BALLOON DILATION;  Surgeon: Ronnette Juniper, MD;  Location: [facility];  Service: Gastroenterology;  Laterality: N/A;  . ESOPHAGOGASTRODUODENOSCOPY (EGD) WITH PROPOFOL N/A 09/18/2020   Procedure: ESOPHAGOGASTRODUODENOSCOPY (EGD) WITH PROPOFOL;  Surgeon: Ronnette Juniper, MD;  Location: [facility];  Service: Gastroenterology;  Laterality: N/A;  . EYE SURGERY Bilateral    Cataract L eye 1-20, R eye 3-20   . FOREIGN BODY REMOVAL  09/18/2020   Procedure: FOREIGN BODY REMOVAL;  Surgeon: Ronnette Juniper, MD;  Location: [facility] ENDOSCOPY;  Service: Gastroenterology;;  . [place]  2001  . REVERSE SHOULDER ARTHROPLASTY Right 03/16/2019   Procedure: REVERSE SHOULDER ARTHROPLASTY;  Surgeon: Netta Cedars, MD;  Location: [facility] ORS;  Service: Orthopedics;  Laterality: Right;  interscalene block  . TUBAL LIGATION     HPI:  Per Neuro notes "[name] is a 75 y.o. female with a medical history significant for dysphagia, hyperthyroidism, polyneuropathy, and migraines with recent admission to [facility] for evaluation of generalized weakness and dizziness and found to have a leukocytosis with a WBC of 24.7K and a hemoglobin of 5.7. 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 She received a blood transfusion on 4/16 but unfortunately had a  transfusion reaction. On 4/18, she was being taken for a bone marrow biopsy to evaluate for a marrow malignancy when she became acutely aphasic and dysarthric and the procedure was cancelled for brain imaging. MRI brain revealed multiple acute infarcts and neurology was consulted for further evaluation and management with plans for patient to transfer to [facility]."    MRI 4/18 There is cortical/subcortical restricted diffusion in the  right parietal lobe extending into the posterior temporal lobe.   Small area of involvement is present in the left frontal lobe near   the operculum. Punctate foci are also present within the left   parietal lobe and right occipital lobe. UGI 03/2016 Small sliding hiatal hernia.   5. Single episode of spontaneous gastroesophageal reflux to the   level of the clavicles with slow clearing of the barium following   reflux.   6. Mild intermittent tertiary peristaltic activity in the esophagus.  Pt has endoscopy with balloon diliatation completed 08/2020.  Per spouse, pt manages bills at home on the computer, SLP advised he learn to manage as back up.  Spouse also endorses that pt becomes short of breath when walking in the house at home.  Swallow evaluation ordered.   MD Please order SLE for speech/language/cognitive evaluation given acute CVA, cognitive linguistic challenges. Thanks.   Assessment / Plan / Recommendation Clinical Impression  Pt awake in bed, sleepy but participative.  No focal cranial nerve deficits observed and she benefited from extra time to follow directions.  She was able to maintain orientation to self, location and event approximately 5 minutes after informed by this SLP.  Pt  able to self feed with hand over hand assistance to bring spoon to mouth.  She independently held small cups for intake.  Moderately strong cough noted post-swallow of water via straw -suspect potential disoordination and premature spillage of liquid into airway.  Later pt passed 3 ounce  [place] when drinking from cup.  No indication of aspiration nor severe dysphagia with thin via cup, nectar via straw, applesauce nor graham cracker.  However, pt's RR up to 25 during intake and her oxygen does decrease to high 80's and low 90s and pt has esophageal dysphagia hx - - thus strict precautions are advised.  Educated pt's spouse and their neighbor to recommendations using teach back and written precautions. SLP Visit Diagnosis: Dysphagia, oropharyngeal phase (R13.12)    Aspiration Risk  Risk for inadequate nutrition/hydration;Mild aspiration risk    Diet Recommendation Dysphagia 3 (Mech soft);Thin liquid   Liquid Administration via: Cup;No straw Medication Administration: Whole meds with puree Supervision: Staff to assist with self feeding Compensations: Slow rate;Small sips/bites Postural Changes: Seated upright at 90 degrees;Remain upright for at least 30 minutes after po intake    Other  Recommendations Oral Care Recommendations: Oral care BID   Follow up Recommendations   TBD     Frequency and Duration min 2x/week  2 weeks       Prognosis Prognosis for Safe Diet Advancement: Good      Swallow Study   General HPI: Per Neuro notes "[name] is a 75 y.o. female with a medical history significant for dysphagia, hyperthyroidism, polyneuropathy, and migraines with recent admission to [facility] for evaluation of generalized weakness and dizziness and found to have a leukocytosis with a WBC of 24.7K and a hemoglobin of 5.7. 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 She received a blood transfusion on 4/16 but unfortunately had a transfusion reaction. On 4/18, she was being taken for a bone marrow biopsy to evaluate for a marrow malignancy when she became acutely aphasic and dysarthric and the procedure was cancelled for brain imaging. MRI brain revealed multiple acute infarcts and neurology was consulted for further evaluation and  management with plans for patient to transfer to [facility]."    MRI 4/18 There is cortical/subcortical restricted diffusion in the  right parietal lobe extending into the posterior temporal lobe.   Small area of involvement is present in the left frontal lobe near   the operculum. Punctate foci are also present within the left   parietal lobe and right occipital lobe. UGI 03/2016 Small sliding hiatal hernia.   5. Single episode of spontaneous gastroesophageal reflux to the   level of the clavicles with slow clearing of the barium following   reflux.   6. Mild intermittent tertiary peristaltic activity in the esophagus.  Pt has endoscopy with balloon diliatation completed 08/2020.  Per spouse, pt manages bills at home on the computer, SLP advised he learn to manage as back up.  Spouse also endorses that pt becomes short of breath when walking in the house at home.  Swallow evaluation ordered. Type of Study: Bedside Swallow Evaluation Previous Swallow Assessment: see HPI Diet Prior to this Study: NPO Respiratory Status: Nasal cannula History of Recent Intubation: No Behavior/Cognition: Alert;Requires cueing;Cooperative (becomes sleepy during session) Oral Cavity Assessment: Within Functional Limits Oral Care Completed by SLP: No Oral Cavity - Dentition: Adequate natural dentition Vision: Functional for self-feeding Self-Feeding Abilities: Needs assist Patient Positioning: Upright in bed Baseline Vocal Quality: Normal Volitional Cough:  Strong Volitional Swallow: Able to elicit    Oral/Motor/Sensory Function Overall Oral Motor/Sensory Function: Mild impairment   Ice Chips Ice chips: Within functional limits Presentation: Spoon   Thin Liquid Thin Liquid: Impaired Presentation: Cup;Straw;Self Fed Pharyngeal  Phase Impairments: Cough - Immediate    Nectar Thick Nectar Thick Liquid: Within functional limits Presentation: Straw;Cup;Self Fed   Honey Thick Honey Thick Liquid: Not tested   Puree  Puree: Within functional limits Presentation: Self Fed;Spoon   Solid     Solid: Within functional limits Presentation: Self Fredirick Lathe 01/06/2021,12:27 PM  Kathleen Lime, MS [facility] SLP [place] Office [phone] Pager [phone]

## 2021-01-06 NOTE — Progress Notes (Signed)
Chaplain engaged in an initial visit with [name] and her husband.  He shared about [name]'s healthcare journey as she was sleeping.  He stated that it all started with [facility] [name] being very dizzy, since then they have been trying to figure out what is happening to her body.  [name] also shared about the amount of support they have which includes help from their neighbors.  [name] shared that he has dementia and he gets rides to the [name] to be with [name] from his neighbors.  [name] has been his caregiver and makes sure he doesn't try to drive on his own.    Chaplain offered support and listening.  Chaplain will follow-up.      01/06/21 1400  Clinical Encounter Type  Visited With Patient and family together  Visit Type Initial

## 2021-01-06 NOTE — Progress Notes (Signed)
PT Cancellation Note  Patient Details Name: [name] MRN: [id number] DOB: 02/10/1946   Cancelled Treatment:    Reason Eval/Treat Not Completed: Medical issues which prohibited therapy,multiple medical issues. Will follow  For PT when stable.Rada Hay 01/06/2021, 6:45 AM Blanchard Kelch PT Acute Rehabilitation Services Pager [phone] Office [phone]

## 2021-01-06 NOTE — Progress Notes (Signed)
Patient was given premedication Tylenol and Benadryl.  Blood bank requested to modify the order before they released the blood. Called [facility] but did not give release order because per Hematology notes to hold off due to possible risk. Ordered to address the blood transfusion in AM shift MD.

## 2021-01-06 NOTE — Progress Notes (Signed)
Unfortunately, [name] had a CVA yesterday.  She was down getting her bone marrow biopsy done.  She has some aphasia.  She had an MRI of the brain which ultimately showed some infarcts.  I have to believe that this is somehow associated with her anemia.  She still hemolyzing quite a bit.  What I am surprised by is the fact that her bilirubin is normal.  I would think that given the incredible reticulocytosis, that her bilirubin would be much higher.  She received IVIG.  She is on prednisone.  I do think she needs to be transfused.  I realize she may have had a transfusion reaction-manifests as a temperature-on Saturday.  However, given the fact that she had a CVA which was likely related to her anemia, I think we really have to transfuse her.  She will get least incompatible blood.  I know the blood bank is doing a fantastic job in trying to match her as much as possible.  She is on prednisone which is already a premedication.  She is getting Pepcid.  Hopefully this will be adequate premedication.  This morning, she is talking to me a little bit.  Again, you just really see this amount of hemolysis.  It is hard to figure out why she is not developing this.  Having the bone marrow biopsy would have helped.  However, this needs to be put on hold until we stabilize her neurological status.  She needs an echocardiogram.  I need to make sure that there is no vegetations on her valves that may have triggered this cerebrovascular event.  She is on baby aspirin.  I wonder if she needs to be on Plavix.  I would think that neurology would be seeing her to help manage all this.  Her BUN and creatinine are okay.  Her LDH is still somewhat elevated at 319.  Her vital signs show temperature of 98.7.  Pulse 68.  Blood pressure 95/59.  Her lungs are clear bilaterally.  Cardiac exam regular rate and rhythm.  She has a 1/6 systolic ejection murmur.  Abdomen is soft.  She has decent bowel sounds.  Extremities shows  no clubbing, cyanosis or edema.  She seems to move her extremities okay.  Neurological exam shows some speech difficulty.  However she understands what I am saying.  She does answer me in simple sentences.  [name] has a significant autoimmune hemolytic anemia.  Again, you just really see reticulocyte count is high.  You uncorrected for the hemoglobin, the reticulocyte count is still quite elevated.  Again I do think we are going to have to transfuse her.  I think the benefit at this point is greater than the risk given that she had this CVA.  I would like to hope that she will recuperate from this CVA.  We need to improve her perfusion into the brain.  Transfusion will help with this.  I know that the staff in the ICU are doing a fantastic job with her.  Tabitha Haw, MD  [name] 4:32

## 2021-01-06 NOTE — Progress Notes (Signed)
STROKE TEAM PROGRESS NOTE   INTERVAL HISTORY No family is at the bedside.  Patient lying in bed, denies acute distress.  However patient does have mild shortness of breath.  She stated that her speech much improved.  She received 1 unit PRBC today.  LE venous Doppler showed a DVT and 2D echo suggesting PE.  She was also found to have right hemianopia.  Vitals:   01/06/21 1400 01/06/21 1500 01/06/21 1600 01/06/21 1700  BP: 124/72 (!) 143/67 (!) 141/93 (!) 150/78  Pulse: 72 81 77 90  Resp: (!) 24 (!) 27 (!) 26 (!) 30  Temp:   (!) 97.5 F (36.4 C)   TempSrc:   Oral   SpO2: 93% 91% 98% 94%  Weight:      Height:       CBC:  Recent Labs  Lab 01/02/21 1056 01/02/21 1400 01/05/21 0307 01/06/21 0245 01/06/21 1419  WBC 24.7*   < > 19.5* 16.3*  --   NEUTROABS 20.6*  --  15.5*  --   --   HGB 5.7*   < > 4.3* 4.5* 7.5*  HCT 15.9*   < > 13.5* 13.5* 21.0*  MCV 109.7*   < > 129.8* 145.2*  --   PLT 522*   < > 266 234  --    < > = values in this interval not displayed.   Basic Metabolic Panel:  Recent Labs  Lab 01/05/21 0307 01/06/21 0245  NA 137 137  K 4.1 3.9  CL 108 109  CO2 22 22  GLUCOSE 125* 131*  BUN 19 19  CREATININE 0.66 0.65  CALCIUM 8.4* 8.4*   Lipid Panel:  Recent Labs  Lab 01/05/21 0317  CHOL 158  TRIG 70  HDL 44  CHOLHDL 3.6  VLDL 14  LDLCALC 100*   HgbA1c:  Recent Labs  Lab 01/05/21 0317  HGBA1C 3.5*   IMAGING and DIAGNOSTICS  MR BRAIN 4/18 Acute infarcts of the right parietotemporal lobes greater than left frontal lobe. Additional punctate left parietal and right occipital acute infarcts. Mild chronic microvascular ischemic changes. Few chronic Microhemorrhages.  MRA BRAIN 4/18 No intracranial large vessel occlusion or proximal high-grade arterial stenosis.  [place] LOWER EXTREMITIES 4/19 BILATERAL:  -No evidence of popliteal cyst, bilaterally.  RIGHT:  - Findings consistent with acute deep vein thrombosis involving the right  peroneal veins.     LEFT:  - Findings consistent with age indeterminate deep vein thrombosis  involving the left popliteal vein.   CAROTID US 4/19 Right Carotid: The extracranial vessels were near-normal with only minimal wall thickening or plaque.   Left Carotid: The extracranial vessels were near-normal with only minimal wall thickening or plaque.   Vertebrals: Bilateral vertebral arteries demonstrate antegrade flow.   Subclavians: Normal flow hemodynamics were seen in bilateral subclavian  arteries.   ECHO 4/19 1. Left ventricular ejection fraction, by estimation, is 60 to 65%. The left ventricle has normal function. The left ventricle has no regional wall motion abnormalities. Left ventricular diastolic parameters are consistent with Grade I diastolic  dysfunction (impaired relaxation).  2. McConnell's sign appears to be present.. Right ventricular systolic function is mildly reduced. The right ventricular size is moderately enlarged. There is moderately elevated pulmonary artery systolic pressure.  3. Right atrial size was moderately dilated.  4. The mitral valve is normal in structure. Mild mitral valve  regurgitation. No evidence of mitral stenosis.  5. Tricuspid valve regurgitation is moderate.  6. The aortic valve is tricuspid. Aortic  valve regurgitation is not  visualized. No aortic stenosis is present.  7. The inferior vena cava is dilated in size with <50% respiratory  variability, suggesting right atrial pressure of 15 mmHg.   PHYSICAL EXAM  Temp:  [97.5 F (36.4 C)-98.7 F (37.1 C)] 98 F (36.7 C) (04/19 2000) Pulse Rate:  [68-103] 90 (04/19 1700) Resp:  [20-33] 30 (04/19 1700) BP: (94-150)/(50-93) 150/78 (04/19 1700) SpO2:  [84 %-100 %] 94 % (04/19 1700)  General - Well nourished, well developed, in mild respiratory distress.  Ophthalmologic - fundi not visualized due to noncooperation.  Cardiovascular - Regular rhythm and rate.  Neuro - awake, alert, eyes open,  orientated to age, place, time, but mild psychomotor slowing. No significant aphasia except intermittent hesitation with speech and paraphasic errors, following all simple commands. Able to name and repeat. No gaze palsy, tracking bilaterally, visual field exam showed right hemianopia, PERRL.  Slight right nasolabial fold flattening. Tongue midline. Bilateral UEs 5/5, no drift. Bilaterally LEs 5/5, no drift. Sensation symmetrical bilaterally, b/l FTN intact, gait not tested.     ASSESSMENT/PLAN [name] is a 75 y.o. female with a medical history significant for dysphagia, hyperthyroidism, polyneuropathy, and migraines with recent admission to [facility] for evaluation of generalized weakness and dizziness and found to have a leukocytosis with a WBC of 24.7K and a hemoglobin of 5.7. 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 She received a blood transfusion on 4/16 but unfortunately had a transfusion reaction. On 4/18, she was being taken for a bone marrow biopsy to evaluate for a marrow malignancy when she became acutely aphasic and dysarthric and the procedure was cancelled for brain imaging. MRI brain revealed multiple acute infarcts and neurology was consulted for further evaluation and management with plans for patient to transfer to [facility]. However, patient deemed not stable enough for transfer so has remained at [facility].    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 Paradoxical emboli with PE and DVT if PFO present   MRI brain Acute embolic infarcts of the right parietotemporal lobes greater than left frontal lobe. Additional punctate left parietal and right occipital acute infarcts.  MRA unremarkable  2D echo EF 60 to 65% but McConnell's sign present  LE venous Doppler showed acute right peroneal DVT, age indeterminant  left popliteal DVT  Carotid Doppler unremarkable  LDL 100  A1c 3.5  VTE prophylaxis - SCDs  No AC/AP prior to admission, currently on ASA 24m. 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 EMarin Olp   If no AC, may consider IVC filter  Hyperlipidemia  Home meds:  none  LDL 100, goal < 70  Follow-up statin in setting  of acute hemolytic anemia and potential liver toxicity  Other Stroke Risk Factors  Advanced Age >/= 74   Former Cigarette smoke  Family hx stroke (father)  Migraines  Other Active Problems managed by primary team  Questionable bacteremia vs false positive culture, holding abx and following repeat cultures   Hospital day # 3  I discussed with Dr. Avon Gully extensively. I spent  35 minutes in total face-to-face time with the patient, more than 50% of which was spent in counseling and coordination of care, reviewing test results, images and medication, and discussing the diagnosis, treatment plan and potential prognosis. This patient's care requiresreview of multiple databases, neurological assessment, discussion with family, other specialists and medical decision making of high complexity.  Rosalin Hawking, MD PhD Stroke Neurology 01/06/2021 9:25 PM   To contact Stroke Continuity  provider, please refer to [email or web address] After hours, contact General Neurology

## 2021-01-06 NOTE — Progress Notes (Signed)
PROGRESS NOTE    [name]  IEP:[id number] DOB: 1946-04-21 DOA: 01/02/2021 PCP: Lorene Dy, MD   Brief Narrative:  [name] is a 75 year old female with past medical history significant for hypothyroidism and polyneuropathy who presented to the ER with generalized weakness.  Upon ED evaluation she was found to be anemic with hemoglobin of 5.7 from 13.  Elevated bilirubin, mostly indirect, elevated LDH and elevated D-dimer. CT abdomen and pelvis was normal with negative FOBT.  Negative chest x-ray.  Patient was admitted to the hospital for evaluation of hemolytic anemia.  Hematology consulted currently on prednisone, famciclovir and Diflucan.   Assessment & Plan:   Active Problems:   Symptomatic anemia   Hemolytic anemia (HCC)   Suspected autoimmune hemolytic anemia, POA - Hematology following, appreciate insight and recommendations - Continue prednisone, folic acid, IVIG to complete 01/05/2021  - We will attempt additional blood transfusion today despite previous transfusion reactions given patient's acute ischemic stroke and ongoing symptomatology - Blood completed 4/19 without overt incident - 1u PRBC since admission - Patient remains high risk for transfusion reaction, will attempt to pretreat with antihistamine per protocol   - Mild transfusion reaction 4/16 -blood transfusion stopped immediately without any prolonged issues or incidents - Continue antifungal and antiviral prophylaxis.   - Scheduled for bone marrow biopsy on the 18th this was canceled in the setting of acute stroke, will need to reattempt in the next 24 to 48 hours once patient is more stable.   - Reticulocyte count improving appropriately with treatment 14.7>>17.1>>21.8>>28.6  Acute ischemic vs embolic stroke in the setting of above Cannot rule out concurrent embolic disease -On the morning of 01/05/2021 while in route to CT scanner for bone marrow biopsy patient had mental status changes with slurred  speech and confusion. -Code stroke was called, discussed with teleneurology, MRI shows - "Acute infarcts of the right parietotemporal lobes greater than left frontal lobe. Additional punctate left parietal and right occipital acute infarcts" -Neurology consulted given atypical appearance, concern for embolic disease transfer to [facility] main hospital for further evaluation with neurology, blood cultures pending, echo, MRA pending.  May require TEE pending cultures versus other findings.  Family updated at bedside. -Hold off on pressors --> small boluses of normal saline to keep MAP greater than 65 per discussion with neurology. -Previous plan to transfer to ICU at main campus on hold given ongoing hematological issues as above  DVT and suspected PE - DVT on BLE [place] today - Echo consistent with PE - Defer to heme-onc for anticoagulation in the setting of above; if anticoagulation to start neuro recommends discontinuation of asa  Questionable bacteremia vs false positive culture - Patient does not meet sepsis criteria - Staph epi 1/2 preliminary - hold abx and follow repeat cultures - Echo pending formal evaluation - may require TEE to rule out vegetations - Will sideline ID if repeat cultures positive given unclear source although bacteremia/vegetations may explain embolic disease.  Hypothyroidism  - Continue Synthroid at current dose.  Anxiety  - Continue Zoloft at home dose.  Ativan as needed for anxiety.  Polyneuropathy - Continue gabapentin  DVT prophylaxis: SCD's  Code Status: Full  Family Communication: Husband updated over the phone  Status is: inpt  Dispo: The patient is from: Home              Anticipated d/c is to: TBD              Anticipated d/c date is: >72h  Patient currently NOT medically stable for discharge  Consultants:   Heme/Onc  Procedures:   TTE 4/19  TEE pending  MRA pending  Bone marrow biopsy pending  Antimicrobials:  Fluconazole,  famcyclovir ongoing  Subjective: No acute issues/events overnight - patient speech continues to improve. Passes speech eval - advance diet as tolerated. Pending further workup - mayneed to be NPO for biopsy/TEE - defer to specialty for timing.  Objective: Vitals:   01/06/21 0400 01/06/21 0415 01/06/21 0500 01/06/21 0600  BP: (!) 104/50  119/60 (!) 95/59  Pulse: 69 (!) 103 83 68  Resp: 20 (!) 26 (!) 23 (!) 25  Temp:      TempSrc:      SpO2: 96% (!) 84% 96% 100%  Weight:      Height:        Intake/Output Summary (Last 24 hours) at 01/06/2021 0710 Last data filed at 01/05/2021 2000 Gross per 24 hour  Intake 750 ml  Output 300 ml  Net 450 ml   Filed Weights   01/02/21 1035  Weight: 72.6 kg    Examination:  General exam: Somewhat pallorous, somewhat stuttering speech but appropriate and AOx4 this morning Respiratory system: Clear to auscultation. Respiratory effort normal. Cardiovascular system: S1 & S2 heard, RRR. No JVD, murmurs, rubs, gallops or clicks. No pedal edema. Gastrointestinal system: Abdomen is nondistended, soft and nontender. No organomegaly or masses felt. Normal bowel sounds heard. Central nervous system: Awake, disoriented unable to appropriately answer orientation questions; slurred speech but without focal deficit; some difficulty following simple commands Extremities: Symmetric 5 x 5 power bilateral upper and lower extremities. Skin: No rashes, lesions or ulcers  Data Reviewed: I have personally reviewed following labs and imaging studies  CBC: Recent Labs  Lab 01/02/21 1056 01/02/21 1400 01/02/21 1612 01/03/21 0341 01/04/21 0244 01/05/21 0307 01/06/21 0245  WBC 24.7*  --   --  15.8* 19.3* 19.5* 16.3*  NEUTROABS 20.6*  --   --   --   --  15.5*  --   HGB 5.7*  --  5.3* 4.3* 4.3* 4.3* 4.5*  HCT 15.9*  --  15.3* 12.9* 13.2* 13.5* 13.5*  MCV 109.7*  --   --  112.2* 116.8* 129.8* 145.2*  PLT 522* 451*  --  383 339 266 902   Basic Metabolic  Panel: Recent Labs  Lab 01/02/21 1056 01/03/21 0341 01/04/21 0244 01/05/21 0307 01/06/21 0245  NA 136 137 138 137 137  K 3.5 4.0 3.8 4.1 3.9  CL 107 109 109 108 109  CO2 17* 21* 21* 22 22  GLUCOSE 156* 107* 132* 125* 131*  BUN 20 18 16 19 19   CREATININE 0.70 0.56 0.59 0.66 0.65  CALCIUM 9.3 8.5* 8.6* 8.4* 8.4*   GFR: Estimated Creatinine Clearance: 63.4 mL/min (by C-G formula based on SCr of 0.65 mg/dL). Liver Function Tests: Recent Labs  Lab 01/02/21 1056 01/03/21 0341 01/04/21 0244 01/05/21 0307 01/06/21 0245  AST 33 29 27 22 31   ALT 25 20 24 23 31   ALKPHOS 60 46 45 39 44  BILITOT 6.5* 5.3* 2.3* 1.1 0.9  PROT 7.5 5.8* 5.9* 7.2 8.4*  ALBUMIN 4.3 3.4* 3.3* 3.1* 3.1*   Recent Labs  Lab 01/02/21 1056  LIPASE 37   No results for input(s): AMMONIA in the last 168 hours. Coagulation Profile: Recent Labs  Lab 01/02/21 1400  INR 1.1   Cardiac Enzymes: No results for input(s): CKTOTAL, CKMB, CKMBINDEX, TROPONINI in the last 168 hours. BNP (last 3  results) No results for input(s): PROBNP in the last 8760 hours. HbA1C: Recent Labs    01/05/21 0317  HGBA1C 3.5*   CBG: Recent Labs  Lab 01/05/21 1021  GLUCAP 111*   Lipid Profile: Recent Labs    01/05/21 0317  CHOL 158  HDL 44  LDLCALC 100*  TRIG 70  CHOLHDL 3.6   Thyroid Function Tests: No results for input(s): TSH, T4TOTAL, FREET4, T3FREE, THYROIDAB in the last 72 hours. Anemia Panel: Recent Labs    01/05/21 0307 01/06/21 0245  RETICCTPCT 28.6* >30.0*   Sepsis Labs: Recent Labs  Lab 01/02/21 1612 01/02/21 1851  LATICACIDVEN 2.5* 3.2*    Recent Results (from the past 240 hour(s))  Resp Panel by RT-PCR (Flu A&B, Covid) Nasopharyngeal Swab     Status: None   Collection Time: 01/02/21 12:15 PM   Specimen: Nasopharyngeal Swab; Nasopharyngeal(NP) swabs in vial transport medium  Result Value Ref Range Status   SARS Coronavirus 2 by RT PCR NEGATIVE NEGATIVE Final    Comment:  (NOTE) SARS-CoV-2 target nucleic acids are NOT DETECTED.  The SARS-CoV-2 RNA is generally detectable in upper respiratory specimens during the acute phase of infection. The lowest concentration of SARS-CoV-2 viral copies this assay can detect is 138 copies/mL. A negative result does not preclude SARS-Cov-2 infection and should not be used as the sole basis for treatment or other patient management decisions. A negative result may occur with  improper specimen collection/handling, submission of specimen other than nasopharyngeal swab, presence of viral mutation(s) within the areas targeted by this assay, and inadequate number of viral copies(<138 copies/mL). A negative result must be combined with clinical observations, patient history, and epidemiological information. The expected result is Negative.  Fact Sheet for Patients:  EntrepreneurPulse.com.au  Fact Sheet for Healthcare Providers:  IncredibleEmployment.be  This test is no t yet approved or cleared by the [place] FDA and  has been authorized for detection and/or diagnosis of SARS-CoV-2 by FDA under an Emergency Use Authorization (EUA). This EUA will remain  in effect (meaning this test can be used) for the duration of the COVID-19 declaration under Section 564(b)(1) of the Act, 21 U.S.C.section 360bbb-3(b)(1), unless the authorization is terminated  or revoked sooner.       Influenza A by PCR NEGATIVE NEGATIVE Final   Influenza B by PCR NEGATIVE NEGATIVE Final    Comment: (NOTE) The Xpert Xpress SARS-CoV-2/FLU/RSV plus assay is intended as an aid in the diagnosis of influenza from Nasopharyngeal swab specimens and should not be used as a sole basis for treatment. Nasal washings and aspirates are unacceptable for Xpert Xpress SARS-CoV-2/FLU/RSV testing.  Fact Sheet for Patients: EntrepreneurPulse.com.au  Fact Sheet for Healthcare  Providers: IncredibleEmployment.be  This test is not yet approved or cleared by the [place] FDA and has been authorized for detection and/or diagnosis of SARS-CoV-2 by FDA under an Emergency Use Authorization (EUA). This EUA will remain in effect (meaning this test can be used) for the duration of the COVID-19 declaration under Section 564(b)(1) of the Act, 21 U.S.C. section 360bbb-3(b)(1), unless the authorization is terminated or revoked.  Performed at [facility], [place] [address] [place], [place] [address]   MRSA PCR Screening     Status: None   Collection Time: 01/03/21  6:24 PM   Specimen: Nasal Mucosa; Nasopharyngeal  Result Value Ref Range Status   MRSA by PCR NEGATIVE NEGATIVE Final    Comment:        The GeneXpert MRSA Assay (FDA  approved for NASAL specimens only), is one component of a comprehensive MRSA colonization surveillance program. It is not intended to diagnose MRSA infection nor to guide or monitor treatment for MRSA infections. Performed at [facility], [place] [address] [place], [place] [address]     Radiology Studies: MR ANGIO HEAD WO CONTRAST  Result Date: 01/05/2021 CLINICAL DATA:  Stroke, follow-up. EXAM: MRA HEAD WITHOUT CONTRAST TECHNIQUE: Angiographic images of the Circle of Willis were obtained using MRA technique without intravenous contrast. COMPARISON:  Brain MRI 01/05/2021. FINDINGS: The intracranial internal carotid arteries are patent. The M1 middle cerebral arteries are patent. No M2 proximal branch occlusion or high-grade proximal stenosis is identified. The anterior cerebral arteries are patent. The intracranial vertebral arteries are patent. The basilar artery is patent. The posterior cerebral arteries are patent. Posterior communicating arteries are present bilaterally. No intracranial aneurysm is identified. IMPRESSION: No intracranial large vessel occlusion or proximal high-grade  arterial stenosis. Electronically Signed   By: Kellie Simmering DO   On: 01/05/2021 17:53   MR BRAIN WO CONTRAST  Result Date: 01/05/2021 CLINICAL DATA:  Mental status change, possible stroke EXAM: MRI HEAD WITHOUT CONTRAST TECHNIQUE: Multiplanar, multiecho pulse sequences of the brain and surrounding structures were obtained without intravenous contrast. COMPARISON:  None. FINDINGS: Brain: There is cortical/subcortical restricted diffusion in the right parietal lobe extending into the posterior temporal lobe. Small area of involvement is present in the left frontal lobe near the operculum. Punctate foci are also present within the left parietal lobe and right occipital lobe. There is no intracranial mass or significant mass effect. There is no hydrocephalus or extra-axial fluid collection. Ventricles and sulci are normal in size and configuration. Patchy foci of T2 hyperintensity in the supratentorial white matter are nonspecific but may reflect mild chronic microvascular ischemic changes. A few scattered foci of subcortical susceptibility hypointensity likely reflect chronic microhemorrhages Vascular: Major vessel flow voids at the skull base are preserved. Skull and upper cervical spine: Decreased T1 marrow signal likely related to known anemia. Sinuses/Orbits: Mild mucosal thickening. Bilateral lens replacements. Other: Sella is unremarkable.  Mastoid air cells are clear. IMPRESSION: Acute infarcts of the right parietotemporal lobes greater than left frontal lobe. Additional punctate left parietal and right occipital acute infarcts. Mild chronic microvascular ischemic changes. Few chronic microhemorrhages. These results will be called to the ordering clinician or representative by the Radiologist Assistant, and communication documented in the PACS or [employer]. Electronically Signed   By: Macy Mis M.D.   On: 01/05/2021 11:26    Scheduled Meds: . sodium chloride   Intravenous Once  . sodium  chloride   Intravenous Once  . acetaminophen  650 mg Oral Once  . aspirin EC  81 mg Oral Daily  . Chlorhexidine Gluconate Cloth  6 each Topical Daily  . diphenhydrAMINE  25 mg Oral Once  . famciclovir  500 mg Oral Daily  . famotidine  40 mg Oral BID  . fluconazole  100 mg Oral Daily  . folic acid  2 mg Oral Daily  . gabapentin  300 mg Oral 2 times per day   And  . gabapentin  600 mg Oral QHS  . levothyroxine  112 mcg Oral Q0600  . mouth rinse  15 mL Mouth Rinse BID  . multivitamin with minerals  1 tablet Oral Daily  . pantoprazole  40 mg Oral BID  . polycarbophil  1,250 mg Oral Q lunch  . predniSONE  80 mg Oral Q breakfast  .  sertraline  50 mg Oral Daily  . sucralfate  1 g Oral BID   Continuous Infusions:    LOS: 3 days   Time spent: 27mn  Grace Haggart C Kenden Brandt, DO [facility]  If 7PM-7AM, please contact night-coverage [email or web address]  01/06/2021, 7:10 AM

## 2021-01-06 NOTE — Progress Notes (Signed)
PHARMACY - PHYSICIAN COMMUNICATION CRITICAL VALUE ALERT - BLOOD CULTURE IDENTIFICATION (BCID)  [name] is an 75 y.o. 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 1 of 2 blood culture bottles collected on 4/18 now has GPC (BCID= staph species, staph epi - mecA/C detected).  Name of physician (or Provider) Contacted: Dr. Natale Milch  Current antibiotics: none  Changes to prescribed antibiotics recommended:  - Per Dr. Natale Milch, hold off on abx for now  Results for orders placed or performed during the hospital encounter of 01/02/21  Blood Culture ID Panel (Reflexed) (Collected: 01/05/2021  3:18 PM)  Result Value Ref Range   Enterococcus faecalis NOT DETECTED NOT DETECTED   Enterococcus Faecium NOT DETECTED NOT DETECTED   Listeria monocytogenes NOT DETECTED NOT DETECTED   Staphylococcus species DETECTED (A) NOT DETECTED   Staphylococcus aureus (BCID) NOT DETECTED NOT DETECTED   Staphylococcus epidermidis DETECTED (A) NOT DETECTED   Staphylococcus lugdunensis NOT DETECTED NOT DETECTED   Streptococcus species NOT DETECTED NOT DETECTED   Streptococcus agalactiae NOT DETECTED NOT DETECTED   Streptococcus pneumoniae NOT DETECTED NOT DETECTED   Streptococcus pyogenes NOT DETECTED NOT DETECTED   A.calcoaceticus-baumannii NOT DETECTED NOT DETECTED   Bacteroides fragilis NOT DETECTED NOT DETECTED   Enterobacterales NOT DETECTED NOT DETECTED   Enterobacter cloacae complex NOT DETECTED NOT DETECTED   Escherichia coli NOT DETECTED NOT DETECTED   Klebsiella aerogenes NOT DETECTED NOT DETECTED   Klebsiella oxytoca NOT DETECTED NOT DETECTED   Klebsiella pneumoniae NOT DETECTED NOT DETECTED   Proteus species NOT DETECTED NOT DETECTED   Salmonella species NOT DETECTED NOT DETECTED   Serratia marcescens NOT DETECTED NOT DETECTED   Haemophilus influenzae NOT DETECTED NOT DETECTED   Neisseria meningitidis  NOT DETECTED NOT DETECTED   Pseudomonas aeruginosa NOT DETECTED NOT DETECTED   Stenotrophomonas maltophilia NOT DETECTED NOT DETECTED   Candida albicans NOT DETECTED NOT DETECTED   Candida auris NOT DETECTED NOT DETECTED   Candida glabrata NOT DETECTED NOT DETECTED   Candida krusei NOT DETECTED NOT DETECTED   Candida parapsilosis NOT DETECTED NOT DETECTED   Candida tropicalis NOT DETECTED NOT DETECTED   Cryptococcus neoformans/gattii NOT DETECTED NOT DETECTED   Methicillin resistance mecA/C DETECTED (A) NOT DETECTED    Gabryel Talamo P 01/06/2021  2:14 PM

## 2021-01-07 DIAGNOSIS — I82402 Acute embolism and thrombosis of unspecified deep veins of left lower extremity: Secondary | ICD-10-CM | POA: Diagnosis not present

## 2021-01-07 DIAGNOSIS — I63413 Cerebral infarction due to embolism of bilateral middle cerebral arteries: Secondary | ICD-10-CM | POA: Diagnosis not present

## 2021-01-07 DIAGNOSIS — D72829 Elevated white blood cell count, unspecified: Secondary | ICD-10-CM | POA: Diagnosis not present

## 2021-01-07 DIAGNOSIS — D589 Hereditary hemolytic anemia, unspecified: Secondary | ICD-10-CM | POA: Diagnosis not present

## 2021-01-07 DIAGNOSIS — D591 Autoimmune hemolytic anemia, unspecified: Secondary | ICD-10-CM | POA: Diagnosis not present

## 2021-01-07 DIAGNOSIS — E039 Hypothyroidism, unspecified: Secondary | ICD-10-CM | POA: Diagnosis not present

## 2021-01-07 LAB — COMPREHENSIVE METABOLIC PANEL
ALT: 34 U/L (ref 0–44)
AST: 41 U/L (ref 15–41)
Albumin: 3.3 g/dL — ABNORMAL LOW (ref 3.5–5.0)
Alkaline Phosphatase: 46 U/L (ref 38–126)
Anion gap: 8 (ref 5–15)
BUN: 18 mg/dL (ref 8–23)
CO2: 21 mmol/L — ABNORMAL LOW (ref 22–32)
Calcium: 8.6 mg/dL — ABNORMAL LOW (ref 8.9–10.3)
Chloride: 108 mmol/L (ref 98–111)
Creatinine, Ser: 0.64 mg/dL (ref 0.44–1.00)
GFR, Estimated: >60 mL/min (ref >60)
Glucose, Bld: 138 mg/dL — ABNORMAL HIGH (ref 70–99)
Potassium: 4.2 mmol/L (ref 3.5–5.1)
Sodium: 137 mmol/L (ref 135–145)
Total Bilirubin: 1.3 mg/dL — ABNORMAL HIGH (ref 0.3–1.2)
Total Protein: 8.5 g/dL — ABNORMAL HIGH (ref 6.5–8.1)

## 2021-01-07 LAB — CBC
HCT: 18.9 % — ABNORMAL LOW (ref 36.0–46.0)
Hemoglobin: 7.1 g/dL — ABNORMAL LOW (ref 12.0–15.0)
MCH: 49 pg — ABNORMAL HIGH (ref 26.0–34.0)
MCHC: 37.6 g/dL — ABNORMAL HIGH (ref 30.0–36.0)
MCV: 130.3 fL — ABNORMAL HIGH (ref 80.0–100.0)
Platelets: 214 10*3/uL (ref 150–400)
RBC: 1.45 MIL/uL — ABNORMAL LOW (ref 3.87–5.11)
WBC: 14.2 10*3/uL — ABNORMAL HIGH (ref 4.0–10.5)
nRBC: 36.7 % — ABNORMAL HIGH (ref 0.0–0.2)

## 2021-01-07 LAB — RETICULOCYTES
Immature Retic Fract: 36.7 % — ABNORMAL HIGH (ref 2.3–15.9)
RBC.: 1.95 MIL/uL — ABNORMAL LOW (ref 3.87–5.11)
Retic Count, Absolute: 496 10*3/uL — ABNORMAL HIGH (ref 19.0–186.0)
Retic Ct Pct: 25.4 % — ABNORMAL HIGH (ref 0.4–3.1)

## 2021-01-07 LAB — CULTURE, BLOOD (ROUTINE X 2): Special Requests: ADEQUATE

## 2021-01-07 LAB — HEMOGLOBIN AND HEMATOCRIT, BLOOD
HCT: 23.1 % — ABNORMAL LOW (ref 36.0–46.0)
Hemoglobin: 7.4 g/dL — ABNORMAL LOW (ref 12.0–15.0)

## 2021-01-07 LAB — ANTITHROMBIN III: AntiThromb III Func: 111 % (ref 75–120)

## 2021-01-07 LAB — LACTATE DEHYDROGENASE: LDH: 390 U/L — ABNORMAL HIGH (ref 98–192)

## 2021-01-07 LAB — HEPARIN LEVEL (UNFRACTIONATED): Heparin Unfractionated: 0.12 IU/mL — ABNORMAL LOW (ref 0.30–0.70)

## 2021-01-07 MED ORDER — HEPARIN (PORCINE) 25000 UT/250ML-% IV SOLN
850.0000 [IU]/h | INTRAVENOUS | Status: DC
  Administered 2021-01-07: 850 [IU]/h via INTRAVENOUS
  Filled 2021-01-07: qty 250

## 2021-01-07 MED ORDER — HEPARIN (PORCINE) 25000 UT/250ML-% IV SOLN
800.0000 [IU]/h | INTRAVENOUS | Status: DC
  Administered 2021-01-08: 1000 [IU]/h via INTRAVENOUS
  Filled 2021-01-07: qty 250

## 2021-01-07 NOTE — Progress Notes (Signed)
PROGRESS NOTE    [name]  EEF:[id number] DOB: 1945-10-09 DOA: 01/02/2021 PCP: Lorene Dy, MD   Brief Narrative:  This 75 years old female with PMH significant for hypothyroidism and polyneuropathy who presented in the ED with generalized weakness.  Upon ED evaluation she was found to be anemic with a hemoglobin of 5.7 down from 13.0 which is her baseline. She was also found to have elevated bilirubin mostly indirect, elevated LDH and elevated D-dimer.  CT abdomen and pelvis was normal. Stool for occult blood negative.  chest x-ray was normal. 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  Hematology was consulted,  Patient started on prednisone, famciclovir and Diflucan.  She is also found to have  stroke, neurology was consulted.  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 Stool for occult blood negative, found to have elevated LDH, elevated bilirubin. 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 Continue prednisone, folic acid, IVIG given on 01/05/2021  Blood completed on 4/19 without overt incident - 1u PRBC since admission Patient remains high risk for transfusion reaction, will attempt to pretreat with antihistamine per protocol  Mild transfusion reaction on 4/16 -blood transfusion stopped immediately without any prolonged issues or incidents She was scheduled for bone marrow biopsy on the 18th,  this was canceled in the setting of acute stroke,  will need to reattempt in the next 24 to 48 hours once patient is more stable.  Reticulocyte count improving appropriately with treatment 14.7>>17.1>>21.8>>28.6 Plan: Bone marrow biopsy when stable.  Hemoglobin 7.1.  Acute ischemic vs  embolic stroke in the setting of above: On 01/05/2021 while in route to CT scanner for bone marrow biopsy,  patient had mental status changes with slurred speech and confusion. Code stroke was called, discussed with teleneurology, MRI shows - "Acute infarcts of the right parietotemporal lobes greater than left frontal lobe. Additional punctate left parietal and right occipital acute infarcts" Neurology consulted given atypical appearance, concern for embolic disease, suggest transfer to [facility] main hospital for further evaluation with neurology, May require TEE pending cultures versus other findings.  Family updated at bedside. Hold off on pressors --> small boluses of normal saline to keep MAP greater than 65 per discussion with neurology. Previous plan to transfer to ICU at main campus on hold given ongoing hematological issues as above. Echo: LVEF 60-65%, no regional wall motion abnormalities.McConnell's sign appears to be present.. Right ventricular systolic function is mildly reduced. The right ventricular size is moderately  enlarged. There is moderately elevated pulmonary artery systolic pressure.    DVT and suspected PE - DVT on BLE US - Echo consistent with PE -Hematology recommended anticoagulation.  He recommended discontinuation of aspirin if anticoagulation is started. -Continue heparin gtt. with the plan to transition to oral DOAC.  Questionable bacteremia vs false positive culture - Patient does not meet sepsis criteria. - Staph epi 1/2 preliminary - hold abx and follow repeat cultures - Echo no vegetation,  may require TEE to rule out vegetations - Will consult ID if repeat cultures positive given unclear source although bacteremia/vegetations may explain embolic disease.  Hypothyroidism - Continue Synthroid at current dose.  Anxiety - Continue Zoloft at home dose. Ativan as needed for anxiety.  Polyneuropathy - Continue gabapentin  DVT prophylaxis: Heparin  gtt Code Status:  Full code. Family Communication:  No family at bed side. Disposition Plan:   Status is: Inpatient  Remains inpatient appropriate because:Inpatient level of care appropriate due to severity of illness   Dispo: The patient is from: Home              Anticipated d/c is to: Home              Patient currently is not medically stable to d/c.   Difficult to place patient No   Consultants:   Hematology  Neurology  Procedures: TTE on 4/19. TEE pending Bone marrow biopsy pending  Antimicrobials:   Anti-infectives (From admission, onward)   Start     Dose/Rate Route Frequency Ordered Stop   01/02/21 1800  fluconazole (DIFLUCAN) tablet 100 mg        100 mg Oral Daily 01/02/21 1713     01/02/21 1800  famciclovir (FAMVIR) tablet 500 mg        500 mg Oral Daily 01/02/21 1713        Subjective: Patient was seen and examined at bedside.  Overnight events noted.  Patient reports feeling better.   Overnight her O2 sat has dropped significantly requiring 9 L of supplemental oxygen to keep saturation above 94%. She denies any pain.  Objective: Vitals:   01/07/21 1100 01/07/21 1200 01/07/21 1211 01/07/21 1300  BP: 129/61 (!) 145/67  140/74  Pulse: 83 86 66 76  Resp: (!) 27 19 (!) 23 20  Temp:  97.8 F (36.6 C)    TempSrc:  Oral    SpO2: 98% (!) 87% 100% 100%  Weight:      Height:        Intake/Output Summary (Last 24 hours) at 01/07/2021 1418 Last data filed at 01/07/2021 1328 Gross per 24 hour  Intake 1502.01 ml  Output --  Net 1502.01 ml   Filed Weights   01/02/21 1035  Weight: 72.6 kg    Examination:  General exam: Appears calm and comfortable, not in any acute distress. Respiratory system: Clear to auscultation. Respiratory effort normal. Cardiovascular system: S1 & S2 heard, RRR. No JVD, murmurs, rubs, gallops or clicks. No pedal edema. Gastrointestinal system: Abdomen is nondistended, soft and nontender. No organomegaly or masses felt. Normal  bowel sounds heard. Central nervous system: Alert and oriented. No focal neurological deficits. Extremities: Symmetric 5 x 5 power.  No edema, no cyanosis, no clubbing. Skin: No rashes, lesions or ulcers Psychiatry: Judgement and insight appear normal. Mood & affect appropriate.     Data Reviewed: I have personally reviewed following labs and imaging studies  CBC: Recent Labs  Lab 01/02/21 1056 01/02/21 1400 01/03/21 0341 01/04/21 0244 01/05/21 0307 01/06/21 0245 01/06/21 1419 01/07/21 0237  WBC 24.7*  --  15.8* 19.3* 19.5* 16.3*  --  14.2*  NEUTROABS 20.6*  --   --   --  15.5*  --   --   --   HGB 5.7*   < > 4.3* 4.3* 4.3* 4.5* 7.5* 7.1*  HCT 15.9*   < > 12.9* 13.2* 13.5* 13.5* 21.0* 18.9*  MCV 109.7*  --  112.2* 116.8* 129.8* 145.2*  --  130.3*  PLT 522*   < > 383 339 266 234  --  214   < > = values in this interval not displayed.   Basic Metabolic Panel: Recent Labs  Lab 01/03/21 0341 01/04/21 0244 01/05/21 0307 01/06/21 0245 01/07/21 0237  NA 137 138  137 137 137  K 4.0 3.8 4.1 3.9 4.2  CL 109 109 108 109 108  CO2 21* 21* 22 22 21*  GLUCOSE 107* 132* 125* 131* 138*  BUN 18 16 19 19 18   CREATININE 0.56 0.59 0.66 0.65 0.64  CALCIUM 8.5* 8.6* 8.4* 8.4* 8.6*   GFR: Estimated Creatinine Clearance: 63.4 mL/min (by C-G formula based on SCr of 0.64 mg/dL). Liver Function Tests: Recent Labs  Lab 01/03/21 0341 01/04/21 0244 01/05/21 0307 01/06/21 0245 01/07/21 0237  AST 29 27 22 31  41  ALT 20 24 23 31  34  ALKPHOS 46 45 39 44 46  BILITOT 5.3* 2.3* 1.1 0.9 1.3*  PROT 5.8* 5.9* 7.2 8.4* 8.5*  ALBUMIN 3.4* 3.3* 3.1* 3.1* 3.3*   Recent Labs  Lab 01/02/21 1056  LIPASE 37   No results for input(s): AMMONIA in the last 168 hours. Coagulation Profile: Recent Labs  Lab 01/02/21 1400  INR 1.1   Cardiac Enzymes: No results for input(s): CKTOTAL, CKMB, CKMBINDEX, TROPONINI in the last 168 hours. BNP (last 3 results) No results for input(s): PROBNP in the last  8760 hours. HbA1C: Recent Labs    01/05/21 0317  HGBA1C 3.5*   CBG: Recent Labs  Lab 01/05/21 1021  GLUCAP 111*   Lipid Profile: Recent Labs    01/05/21 0317  CHOL 158  HDL 44  LDLCALC 100*  TRIG 70  CHOLHDL 3.6   Thyroid Function Tests: No results for input(s): TSH, T4TOTAL, FREET4, T3FREE, THYROIDAB in the last 72 hours. Anemia Panel: Recent Labs    01/06/21 0245 01/07/21 0237  RETICCTPCT >30.0* 25.4*   Sepsis Labs: Recent Labs  Lab 01/02/21 1612 01/02/21 1851  LATICACIDVEN 2.5* 3.2*    Recent Results (from the past 240 hour(s))  Resp Panel by RT-PCR (Flu A&B, Covid) Nasopharyngeal Swab     Status: None   Collection Time: 01/02/21 12:15 PM   Specimen: Nasopharyngeal Swab; Nasopharyngeal(NP) swabs in vial transport medium  Result Value Ref Range Status   SARS Coronavirus 2 by RT PCR NEGATIVE NEGATIVE Final    Comment: (NOTE) SARS-CoV-2 target nucleic acids are NOT DETECTED.  The SARS-CoV-2 RNA is generally detectable in upper respiratory specimens during the acute phase of infection. The lowest concentration of SARS-CoV-2 viral copies this assay can detect is 138 copies/mL. A negative result does not preclude SARS-Cov-2 infection and should not be used as the sole basis for treatment or other patient management decisions. A negative result may occur with  improper specimen collection/handling, submission of specimen other than nasopharyngeal swab, presence of viral mutation(s) within the areas targeted by this assay, and inadequate number of viral copies(<138 copies/mL). A negative result must be combined with clinical observations, patient history, and epidemiological information. The expected result is Negative.  Fact Sheet for Patients:  EntrepreneurPulse.com.au  Fact Sheet for Healthcare Providers:  IncredibleEmployment.be  This test is no t yet approved or cleared by the [place] FDA and  has been  authorized for detection and/or diagnosis of SARS-CoV-2 by FDA under an Emergency Use Authorization (EUA). This EUA will remain  in effect (meaning this test can be used) for the duration of the COVID-19 declaration under Section 564(b)(1) of the Act, 21 U.S.C.section 360bbb-3(b)(1), unless the authorization is terminated  or revoked sooner.       Influenza A by PCR NEGATIVE NEGATIVE Final   Influenza B by PCR NEGATIVE NEGATIVE Final    Comment: (NOTE) The Xpert Xpress SARS-CoV-2/FLU/RSV plus assay is intended as an  aid in the diagnosis of influenza from Nasopharyngeal swab specimens and should not be used as a sole basis for treatment. Nasal washings and aspirates are unacceptable for Xpert Xpress SARS-CoV-2/FLU/RSV testing.  Fact Sheet for Patients: EntrepreneurPulse.com.au  Fact Sheet for Healthcare Providers: IncredibleEmployment.be  This test is not yet approved or cleared by the [place] FDA and has been authorized for detection and/or diagnosis of SARS-CoV-2 by FDA under an Emergency Use Authorization (EUA). This EUA will remain in effect (meaning this test can be used) for the duration of the COVID-19 declaration under Section 564(b)(1) of the Act, 21 U.S.C. section 360bbb-3(b)(1), unless the authorization is terminated or revoked.  Performed at [facility], [place] [address], [place], [place] [address]   MRSA PCR Screening     Status: None   Collection Time: 01/03/21  6:24 PM   Specimen: Nasal Mucosa; Nasopharyngeal  Result Value Ref Range Status   MRSA by PCR NEGATIVE NEGATIVE Final    Comment:        The GeneXpert MRSA Assay (FDA approved for NASAL specimens only), is one component of a comprehensive MRSA colonization surveillance program. It is not intended to diagnose MRSA infection nor to guide or monitor treatment for MRSA infections. Performed at [facility], [place] [address] [place], [place] [address]   Culture, blood (routine x 2)     Status: None (Preliminary result)   Collection Time: 01/05/21  3:18 PM   Specimen: BLOOD LEFT FOREARM  Result Value Ref Range Status   Specimen Description   Final    BLOOD LEFT FOREARM Performed at [facility] [address], [place], [place] [address]    Special Requests   Final    BOTTLES DRAWN AEROBIC ONLY Blood Culture adequate volume Performed at [facility] [address], [place], [place] [address]    Culture   Final    NO GROWTH 2 DAYS Performed at [facility] [address] [place], [place] [address]    Report Status PENDING  Incomplete  Culture, blood (routine x 2)     Status: Abnormal   Collection Time: 01/05/21  3:18 PM   Specimen: BLOOD  Result Value Ref Range Status   Specimen Description   Final    BLOOD LEFT ANTECUBITAL Performed at [facility] [address], [place], [place] [address]    Special Requests   Final    BOTTLES DRAWN AEROBIC ONLY Blood Culture adequate volume Performed at [facility] [address] [place], [place] [address]    Culture  Setup Time   Final    GRAM POSITIVE COCCI AEROBIC BOTTLE ONLY CRITICAL RESULT CALLED TO, READ BACK BY AND VERIFIED WITH: D,WOFFORD PHARMD @1411  01/06/21 EB    Culture (A)  Final    STAPHYLOCOCCUS EPIDERMIDIS THE SIGNIFICANCE OF ISOLATING THIS ORGANISM FROM A SINGLE SET OF BLOOD CULTURES WHEN MULTIPLE SETS ARE DRAWN IS UNCERTAIN. PLEASE NOTIFY THE MICROBIOLOGY DEPARTMENT WITHIN ONE WEEK IF SPECIATION AND SENSITIVITIES ARE REQUIRED. Performed at [facility], [place] [address] [place],  [address]    Report Status 01/07/2021 FINAL  Final  Blood Culture ID Panel (Reflexed)     Status: Abnormal   Collection Time: 01/05/21  3:18 PM  Result Value Ref Range Status   Enterococcus faecalis NOT DETECTED NOT DETECTED Final   Enterococcus Faecium NOT  DETECTED NOT DETECTED Final   Listeria monocytogenes NOT DETECTED NOT DETECTED Final  Staphylococcus species DETECTED (A) NOT DETECTED Final    Comment: CRITICAL RESULT CALLED TO, READ BACK BY AND VERIFIED WITH: D,WOFFORD PHARMD @1411  01/06/21 EB    Staphylococcus aureus (BCID) NOT DETECTED NOT DETECTED Final   Staphylococcus epidermidis DETECTED (A) NOT DETECTED Final    Comment: Methicillin (oxacillin) resistant coagulase negative staphylococcus. Possible blood culture contaminant (unless isolated from more than one blood culture draw or clinical case suggests pathogenicity). No antibiotic treatment is indicated for blood  culture contaminants. CRITICAL RESULT CALLED TO, READ BACK BY AND VERIFIED WITH: D,WOFFORD PHARMD @1411  01/06/21 EB    Staphylococcus lugdunensis NOT DETECTED NOT DETECTED Final   Streptococcus species NOT DETECTED NOT DETECTED Final   Streptococcus agalactiae NOT DETECTED NOT DETECTED Final   Streptococcus pneumoniae NOT DETECTED NOT DETECTED Final   Streptococcus pyogenes NOT DETECTED NOT DETECTED Final   A.calcoaceticus-baumannii NOT DETECTED NOT DETECTED Final   Bacteroides fragilis NOT DETECTED NOT DETECTED Final   Enterobacterales NOT DETECTED NOT DETECTED Final   Enterobacter cloacae complex NOT DETECTED NOT DETECTED Final   Escherichia coli NOT DETECTED NOT DETECTED Final   Klebsiella aerogenes NOT DETECTED NOT DETECTED Final   Klebsiella oxytoca NOT DETECTED NOT DETECTED Final   Klebsiella pneumoniae NOT DETECTED NOT DETECTED Final   Proteus species NOT DETECTED NOT DETECTED Final   Salmonella species NOT DETECTED NOT DETECTED Final   Serratia marcescens NOT DETECTED NOT DETECTED Final   Haemophilus influenzae NOT DETECTED NOT DETECTED Final   Neisseria meningitidis NOT DETECTED NOT DETECTED Final   Pseudomonas aeruginosa NOT DETECTED NOT DETECTED Final   Stenotrophomonas maltophilia NOT DETECTED NOT DETECTED Final   Candida albicans NOT DETECTED NOT  DETECTED Final   Candida auris NOT DETECTED NOT DETECTED Final   Candida glabrata NOT DETECTED NOT DETECTED Final   Candida krusei NOT DETECTED NOT DETECTED Final   Candida parapsilosis NOT DETECTED NOT DETECTED Final   Candida tropicalis NOT DETECTED NOT DETECTED Final   Cryptococcus neoformans/gattii NOT DETECTED NOT DETECTED Final   Methicillin resistance mecA/C DETECTED (A) NOT DETECTED Final    Comment: CRITICAL RESULT CALLED TO, READ BACK BY AND VERIFIED WITH: D,WOFFORD PHARMD @1411  01/06/21 EB Performed at [facility], [address], [place], [place] [address]   Culture, blood (routine x 2)     Status: None (Preliminary result)   Collection Time: 01/06/21  4:05 PM   Specimen: BLOOD  Result Value Ref Range Status   Specimen Description   Final    BLOOD LEFT ANTECUBITAL Performed at [facility] [address], [place], [place] [address]    Special Requests   Final    BOTTLES DRAWN AEROBIC ONLY Blood Culture adequate volume Performed at [facility] [address] [place], [place] [address]    Culture   Final    NO GROWTH < 12 HOURS Performed at [facility] [address] [place], [place] [address]    Report Status PENDING  Incomplete  Culture, blood (routine x 2)     Status: None (Preliminary result)   Collection Time: 01/06/21  4:05 PM   Specimen: BLOOD LEFT HAND  Result Value Ref Range Status   Specimen Description   Final    BLOOD LEFT HAND Performed at [facility] [address] [place], [place] [address]    Special Requests   Final    BOTTLES DRAWN AEROBIC ONLY Blood Culture results may not be optimal due to an inadequate volume of  blood received in culture bottles Performed at [facility], [place] [address], [place], [place] [address]    Culture   Final    NO GROWTH < 12 HOURS Performed at [facility] [address], [place], [place] [address]    Report  Status PENDING  Incomplete         Radiology Studies: MR ANGIO HEAD WO CONTRAST  Result Date: 01/05/2021 CLINICAL DATA:  Stroke, follow-up. EXAM: MRA HEAD WITHOUT CONTRAST TECHNIQUE: Angiographic images of the Circle of Willis were obtained using MRA technique without intravenous contrast. COMPARISON:  Brain MRI 01/05/2021. FINDINGS: The intracranial internal carotid arteries are patent. The M1 middle cerebral arteries are patent. No M2 proximal branch occlusion or high-grade proximal stenosis is identified. The anterior cerebral arteries are patent. The intracranial vertebral arteries are patent. The basilar artery is patent. The posterior cerebral arteries are patent. Posterior communicating arteries are present bilaterally. No intracranial aneurysm is identified. IMPRESSION: No intracranial large vessel occlusion or proximal high-grade arterial stenosis. Electronically Signed   By: Kellie Simmering DO   On: 01/05/2021 17:53   ECHOCARDIOGRAM COMPLETE  Result Date: 01/06/2021    ECHOCARDIOGRAM REPORT   Patient Name:   [name] Date of Exam: 01/06/2021 Medical Rec #:  [id number]      Height:       65.0 in Accession #:    [id number]     Weight:       160.0 lb Date of Birth:  1945-09-22      BSA:          1.799 m Patient Age:    78 years       BP:           95/59 mmHg Patient Gender: F              HR:           73 bpm. Exam Location:  Inpatient Procedure: 2D Echo, Color Doppler and Cardiac Doppler Indications:    Stroke I63.9  History:        Patient has prior history of Echocardiogram examinations, most                 recent 07/31/2013. Signs/Symptoms:Dyspnea; Risk                 Factors:Diabetes.  Sonographer:    Bernadene Person RDCS Referring Phys: Rudell Cobb ENNEVER IMPRESSIONS  1. Left ventricular ejection fraction, by estimation, is 60 to 65%. The left ventricle has normal function. The left ventricle has no regional wall motion abnormalities. Left ventricular diastolic parameters are consistent  with Grade I diastolic dysfunction (impaired relaxation).  2. McConnell's sign appears to be present.. Right ventricular systolic function is mildly reduced. The right ventricular size is moderately enlarged. There is moderately elevated pulmonary artery systolic pressure.  3. Right atrial size was moderately dilated.  4. The mitral valve is normal in structure. Mild mitral valve regurgitation. No evidence of mitral stenosis.  5. Tricuspid valve regurgitation is moderate.  6. The aortic valve is tricuspid. Aortic valve regurgitation is not visualized. No aortic stenosis is present.  7. The inferior vena cava is dilated in size with <50% respiratory variability, suggesting right atrial pressure of 15 mmHg. Comparison(s): Prior images unable to be directly viewed, comparison made by report only. Findings suggest cor pulmonale, possibly acute. Consider pulmonary embolism. Discussed with primary team. FINDINGS  Left Ventricle: Left ventricular ejection fraction, by estimation, is 60 to 65%.  The left ventricle has normal function. The left ventricle has no regional wall motion abnormalities. The left ventricular internal cavity size was normal in size. There is  no left ventricular hypertrophy. Left ventricular diastolic parameters are consistent with Grade I diastolic dysfunction (impaired relaxation). Normal left ventricular filling pressure. Right Ventricle: McConnell's sign appears to be present. The right ventricular size is moderately enlarged. No increase in right ventricular wall thickness. Right ventricular systolic function is mildly reduced. There is moderately elevated pulmonary artery systolic pressure. The tricuspid regurgitant velocity is 3.30 m/s, and with an assumed right atrial pressure of 15 mmHg, the estimated right ventricular systolic pressure is 41.9 mmHg. Left Atrium: Left atrial size was normal in size. Right Atrium: Right atrial size was moderately dilated. Prominent Eustachian valve.  Pericardium: There is no evidence of pericardial effusion. Mitral Valve: The mitral valve is normal in structure. Mild mitral valve regurgitation, with centrally-directed jet. No evidence of mitral valve stenosis. Tricuspid Valve: The tricuspid valve is normal in structure. Tricuspid valve regurgitation is moderate . No evidence of tricuspid stenosis. Aortic Valve: The aortic valve is tricuspid. Aortic valve regurgitation is not visualized. No aortic stenosis is present. Pulmonic Valve: The pulmonic valve was normal in structure. Pulmonic valve regurgitation is mild. No evidence of pulmonic stenosis. Aorta: The aortic root is normal in size and structure. Venous: The inferior vena cava is dilated in size with less than 50% respiratory variability, suggesting right atrial pressure of 15 mmHg. IAS/Shunts: No atrial level shunt detected by color flow Doppler.  LEFT VENTRICLE PLAX 2D LVIDd:         4.30 cm  Diastology LVIDs:         2.70 cm  LV e' medial:    7.27 cm/s LV PW:         0.80 cm  LV E/e' medial:  8.8 LV IVS:        0.80 cm  LV e' lateral:   12.90 cm/s LVOT diam:     1.80 cm  LV E/e' lateral: 5.0 LV SV:         55 LV SV Index:   30 LVOT Area:     2.54 cm  RIGHT VENTRICLE TAPSE (M-mode): 1.9 cm LEFT ATRIUM             Index       RIGHT ATRIUM           Index LA diam:        2.60 cm 1.45 cm/m  RA Area:     25.40 cm LA Vol (A2C):   37.8 ml 21.01 ml/m RA Volume:   96.80 ml  53.80 ml/m LA Vol (A4C):   30.6 ml 17.01 ml/m LA Biplane Vol: 35.6 ml 19.79 ml/m  AORTIC VALVE LVOT Vmax:   105.00 cm/s LVOT Vmean:  70.800 cm/s LVOT VTI:    0.215 m  AORTA Ao Root diam: 3.30 cm Ao Asc diam:  3.60 cm MITRAL VALVE               TRICUSPID VALVE MV Area (PHT): 2.29 cm    TR Peak grad:   43.6 mmHg MV Decel Time: 331 msec    TR Vmax:        330.00 cm/s MV E velocity: 64.30 cm/s MV A velocity: 81.40 cm/s  SHUNTS MV E/A ratio:  0.79        Systemic VTI:  0.22 m  Systemic Diam: 1.80 cm Sanda Klein  MD Electronically signed by Sanda Klein MD Signature Date/Time: 01/06/2021/4:50:13 PM    Final    VAS US CAROTID  Result Date: 01/06/2021 Carotid Arterial Duplex Study Indications:       CVA and Aphasia & Dysarthria. Risk Factors:      Past history of smoking. Other Factors:     Autoimmune hemolytic anemia. Comparison Study:  No previous exams Performing Technologist: Rogelia Rohrer  Examination Guidelines: A complete evaluation includes B-mode imaging, spectral Doppler, color Doppler, and power Doppler as needed of all accessible portions of each vessel. Bilateral testing is considered an integral part of a complete examination. Limited examinations for reoccurring indications may be performed as noted.  Right Carotid Findings: +----------+--------+--------+--------+------------------+------------------+           PSV cm/sEDV cm/sStenosisPlaque DescriptionComments           +----------+--------+--------+--------+------------------+------------------+ CCA Prox  76      17              heterogenous      intimal thickening +----------+--------+--------+--------+------------------+------------------+ CCA Distal76      27                                intimal thickening +----------+--------+--------+--------+------------------+------------------+ ICA Prox  104     35                                intimal thickening +----------+--------+--------+--------+------------------+------------------+ ICA Distal100     26                                                   +----------+--------+--------+--------+------------------+------------------+ ECA       62      7                                                    +----------+--------+--------+--------+------------------+------------------+ +----------+--------+-------+----------------+-------------------+           PSV cm/sEDV cmsDescribe        Arm Pressure (mmHG)  +----------+--------+-------+----------------+-------------------+ [id number]             Multiphasic, WNL                    +----------+--------+-------+----------------+-------------------+ +---------+--------+--+--------+--+---------+ VertebralPSV cm/s46EDV cm/s16Antegrade +---------+--------+--+--------+--+---------+  Left Carotid Findings: +----------+--------+--------+--------+------------------+------------------+           PSV cm/sEDV cm/sStenosisPlaque DescriptionComments           +----------+--------+--------+--------+------------------+------------------+ CCA Prox  69      19                                intimal thickening +----------+--------+--------+--------+------------------+------------------+ CCA Distal65      18                                intimal thickening +----------+--------+--------+--------+------------------+------------------+ ICA Prox  80      25                                                   +----------+--------+--------+--------+------------------+------------------+  ICA Distal107     36                                                   +----------+--------+--------+--------+------------------+------------------+ ECA       66      16                                                   +----------+--------+--------+--------+------------------+------------------+ +----------+--------+--------+----------------+-------------------+           PSV cm/sEDV cm/sDescribe        Arm Pressure (mmHG) +----------+--------+--------+----------------+-------------------+ [id number]             Multiphasic, WNL                    +----------+--------+--------+----------------+-------------------+ +---------+--------+--+--------+--+---------+ VertebralPSV cm/s62EDV cm/s25Antegrade +---------+--------+--+--------+--+---------+   Summary: Right Carotid: The extracranial vessels were near-normal with only minimal wall                 thickening or plaque. Left Carotid: The extracranial vessels were near-normal with only minimal wall               thickening or plaque. Vertebrals:  Bilateral vertebral arteries demonstrate antegrade flow. Subclavians: Normal flow hemodynamics were seen in bilateral subclavian              arteries. *See table(s) above for measurements and observations.  Electronically signed by Curt Jews MD on 01/06/2021 at 5:10:01 PM.    Final    VAS [place] LOWER EXTREMITY VENOUS (DVT)  Result Date: 01/06/2021  Lower Venous DVT Study Indications: Stroke.  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 Comparison Study: No previous exams Performing Technologist: Rogelia Rohrer  Examination Guidelines: A complete evaluation includes B-mode imaging, spectral Doppler, color Doppler, and power Doppler as needed of all accessible portions of each vessel. Bilateral testing is considered an integral part of a complete examination. Limited examinations for reoccurring indications may be performed as noted. The reflux portion of the exam is performed with the patient in reverse Trendelenburg.  +---------+---------------+---------+-----------+----------+-------------------+ RIGHT    CompressibilityPhasicitySpontaneityPropertiesThrombus Aging      +---------+---------------+---------+-----------+----------+-------------------+ CFV      Full           Yes      Yes                                      +---------+---------------+---------+-----------+----------+-------------------+ SFJ      Full                                                             +---------+---------------+---------+-----------+----------+-------------------+ FV Prox  Full           Yes      Yes                                      +---------+---------------+---------+-----------+----------+-------------------+  FV Mid   Full           Yes      Yes                                       +---------+---------------+---------+-----------+----------+-------------------+ FV DistalFull           Yes      Yes                                      +---------+---------------+---------+-----------+----------+-------------------+ PFV      Full                                                             +---------+---------------+---------+-----------+----------+-------------------+ POP      Full           Yes      Yes                                      +---------+---------------+---------+-----------+----------+-------------------+ PTV      Full                                                             +---------+---------------+---------+-----------+----------+-------------------+ PERO     None           No       No                   Acute DVT of one of                                                       paired peroneal                                                           veins               +---------+---------------+---------+-----------+----------+-------------------+   +---------+---------------+---------+-----------+----------+-----------------+ LEFT     CompressibilityPhasicitySpontaneityPropertiesThrombus Aging    +---------+---------------+---------+-----------+----------+-----------------+ CFV      Full           Yes      Yes                                    +---------+---------------+---------+-----------+----------+-----------------+ SFJ      Full                                                           +---------+---------------+---------+-----------+----------+-----------------+  FV Prox  Full           Yes      Yes                                    +---------+---------------+---------+-----------+----------+-----------------+ FV Mid   Full           Yes      Yes                                    +---------+---------------+---------+-----------+----------+-----------------+ FV DistalFull            Yes      Yes                                    +---------+---------------+---------+-----------+----------+-----------------+ PFV      Full                                                           +---------+---------------+---------+-----------+----------+-----------------+ POP      Partial        No       No                   Age Indeterminate +---------+---------------+---------+-----------+----------+-----------------+ PTV      Full                                                           +---------+---------------+---------+-----------+----------+-----------------+ PERO     Full                                                           +---------+---------------+---------+-----------+----------+-----------------+     Summary: BILATERAL: -No evidence of popliteal cyst, bilaterally. RIGHT: - Findings consistent with acute deep vein thrombosis involving the right peroneal veins.  LEFT: - Findings consistent with age indeterminate deep vein thrombosis involving the left popliteal vein.  *See table(s) above for measurements and observations. Electronically signed by Curt Jews MD on 01/06/2021 at 5:09:40 PM.    Final    Scheduled Meds: . sodium chloride   Intravenous Once  . Chlorhexidine Gluconate Cloth  6 each Topical Daily  . famciclovir  500 mg Oral Daily  . fluconazole  100 mg Oral Daily  . folic acid  2 mg Oral Daily  . gabapentin  300 mg Oral 2 times per day   And  . gabapentin  600 mg Oral QHS  . levothyroxine  112 mcg Oral Q0600  . mouth rinse  15 mL Mouth Rinse BID  . multivitamin with minerals  1 tablet Oral Daily  . pantoprazole  40 mg Oral BID  . polycarbophil  1,250 mg Oral Q lunch  . predniSONE  80 mg Oral Q breakfast  .  sertraline  50 mg Oral Daily  . sucralfate  1 g Oral BID   Continuous Infusions: . heparin 850 Units/hr (01/07/21 1213)     LOS: 4 days    Time spent: 35 mins    Tahlor Berenguer, MD [facility]   If 7PM-7AM,  please contact night-coverage

## 2021-01-07 NOTE — Progress Notes (Signed)
PT Cancellation Note  Patient Details Name: [name] MRN: [id number] DOB: 12/10/1945   Cancelled Treatment:    Reason Eval/Treat Not Completed: Medical issues which prohibited therapy, per  PT Clinical protocol, Initiate PT after heparin is  In therapeutic range. Will check back tomorrow.   Rada Hay 01/07/2021, 9:35 AM  Blanchard Kelch PT Acute Rehabilitation Services Pager [phone] Office [phone]

## 2021-01-07 NOTE — Progress Notes (Signed)
STROKE TEAM PROGRESS NOTE   INTERVAL HISTORY RN is at the bedside.  Patient lying in bed, initially sleeping but easily arousable for cooperative with exam. She denies any distress, RN told me that her O2 down from 10L to 8L now. Her labs certainly reassuring with stable Hb and improving leukocytosis. She is on heparin IV now. Hypercoagulable labs pending.   Vitals:   01/07/21 1627 01/07/21 1628 01/07/21 1629 01/07/21 1630  BP:      Pulse: 80 76 77 73  Resp: 18 20 (!) 23 (!) 22  Temp:      TempSrc:      SpO2: 98% 98% 99% 94%  Weight:      Height:       CBC:  Recent Labs  Lab 01/02/21 1056 01/02/21 1400 01/05/21 0307 01/06/21 0245 01/06/21 1419 01/07/21 0237  WBC 24.7*   < > 19.5* 16.3*  --  14.2*  NEUTROABS 20.6*  --  15.5*  --   --   --   HGB 5.7*   < > 4.3* 4.5* 7.5* 7.1*  HCT 15.9*   < > 13.5* 13.5* 21.0* 18.9*  MCV 109.7*   < > 129.8* 145.2*  --  130.3*  PLT 522*   < > 266 234  --  214   < > = values in this interval not displayed.   Basic Metabolic Panel:  Recent Labs  Lab 01/06/21 0245 01/07/21 0237  NA 137 137  K 3.9 4.2  CL 109 108  CO2 22 21*  GLUCOSE 131* 138*  BUN 19 18  CREATININE 0.65 0.64  CALCIUM 8.4* 8.6*   Lipid Panel:  Recent Labs  Lab 01/05/21 0317  CHOL 158  TRIG 70  HDL 44  CHOLHDL 3.6  VLDL 14  LDLCALC 100*   HgbA1c:  Recent Labs  Lab 01/05/21 0317  HGBA1C 3.5*   IMAGING and DIAGNOSTICS  MR BRAIN 4/18 Acute infarcts of the right parietotemporal lobes greater than left frontal lobe. Additional punctate left parietal and right occipital acute infarcts. Mild chronic microvascular ischemic changes. Few chronic Microhemorrhages.  MRA BRAIN 4/18 No intracranial large vessel occlusion or proximal high-grade arterial stenosis.  [place] LOWER EXTREMITIES 4/19 BILATERAL:  -No evidence of popliteal cyst, bilaterally.  RIGHT:  - Findings consistent with acute deep vein thrombosis involving the right  peroneal veins.    LEFT:   - Findings consistent with age indeterminate deep vein thrombosis  involving the left popliteal vein.   CAROTID US 4/19 Right Carotid: The extracranial vessels were near-normal with only minimal wall thickening or plaque.   Left Carotid: The extracranial vessels were near-normal with only minimal wall thickening or plaque.   Vertebrals: Bilateral vertebral arteries demonstrate antegrade flow.   Subclavians: Normal flow hemodynamics were seen in bilateral subclavian  arteries.   ECHO 4/19 1. Left ventricular ejection fraction, by estimation, is 60 to 65%. The left ventricle has normal function. The left ventricle has no regional wall motion abnormalities. Left ventricular diastolic parameters are consistent with Grade I diastolic  dysfunction (impaired relaxation).  2. McConnell's sign appears to be present.. Right ventricular systolic function is mildly reduced. The right ventricular size is moderately enlarged. There is moderately elevated pulmonary artery systolic pressure.  3. Right atrial size was moderately dilated.  4. The mitral valve is normal in structure. Mild mitral valve  regurgitation. No evidence of mitral stenosis.  5. Tricuspid valve regurgitation is moderate.  6. The aortic valve is tricuspid. Aortic valve regurgitation is  not  visualized. No aortic stenosis is present.  7. The inferior vena cava is dilated in size with <50% respiratory  variability, suggesting right atrial pressure of 15 mmHg.   PHYSICAL EXAM  Temp:  [97.6 F (36.4 C)-98.8 F (37.1 C)] 98.8 F (37.1 C) (04/20 1600) Pulse Rate:  [62-95] 73 (04/20 1630) Resp:  [15-31] 22 (04/20 1630) BP: (102-149)/(50-88) 111/50 (04/20 1615) SpO2:  [87 %-100 %] 94 % (04/20 1630)  General - Well nourished, well developed, in mild respiratory distress.  Ophthalmologic - fundi not visualized due to noncooperation.  Cardiovascular - Regular rhythm and rate.  Neuro - awake, alert, eyes open, orientated to  age, place, time, but mild psychomotor slowing. No significant aphasia except intermittent hesitation with speech and paraphasic errors, following all simple commands. Able to name and repeat. No gaze palsy, tracking bilaterally, visual field exam showed right lower quadrantanopia, PERRL.  Slight right nasolabial fold flattening. Tongue midline. Bilateral UEs 5/5, no drift. Bilaterally LEs 5/5, no drift. Sensation symmetrical bilaterally, b/l FTN intact, gait not tested.    ASSESSMENT/PLAN [name] is a 75 y.o. female with a medical history significant for dysphagia, hyperthyroidism, polyneuropathy, and migraines with recent admission to [facility] for evaluation of generalized weakness and dizziness and found to have a leukocytosis with a WBC of 24.7K and a hemoglobin of 5.7. 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 She received a blood transfusion on 4/16 but unfortunately had a transfusion reaction. On 4/18, she was being taken for a bone marrow biopsy to evaluate for a marrow malignancy when she became acutely aphasic and dysarthric and the procedure was cancelled for brain imaging. MRI brain revealed multiple acute infarcts and neurology was consulted for further evaluation and management with plans for patient to transfer to [facility]. However, patient deemed not stable enough for transfer so has remained at [facility].    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 Paradoxical emboli with PE and DVT if PFO present   MRI brain Acute embolic infarcts of the right parietotemporal lobes greater than left frontal lobe. Additional punctate left parietal and right occipital acute infarcts.  MRA unremarkable  2D echo EF 60 to 65% but McConnell's sign present  LE venous Doppler showed acute right peroneal DVT,  age indeterminant left popliteal DVT  Carotid Doppler unremarkable  LDL 100  A1c 3.5  Hypercoagulable lab pending  VTE prophylaxis - SCDs  No AC/AP prior to admission, currently heparin IV. 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 Further AC regimen per hem/onc  Hyperlipidemia  Home meds:  none  LDL 100, goal < 70  Hold off statin in setting of acute hemolytic anemia and potential liver toxicity  Other Stroke Risk Factors  Advanced Age >/=  18   Former Cigarette smoke  Family hx stroke (father)  Migraines  Other Active Problems managed by primary team  Questionable bacteremia vs false positive culture, holding abx and following repeat cultures   Hospital day # 4   Rosalin Hawking, MD PhD Stroke Neurology 01/07/2021 6:27 PM   To contact Stroke Continuity provider, please refer to [email or web address] After hours, contact General Neurology

## 2021-01-07 NOTE — Plan of Care (Signed)
RN will continue to monitor patient's progression of care plan.  

## 2021-01-07 NOTE — Progress Notes (Signed)
OT Cancellation Note  Patient Details Name: [name] MRN: [id number] DOB: 10/19/1945   Cancelled Treatment:    Reason Eval/Treat Not Completed: Medical issues which prohibited therapy Medical issues which prohibited therapy, per protocol initiate threapy after heparin is  In therapeutic range. Will check back tomorrow.  Erisa Mehlman L Jaice Lague 01/07/2021, 11:33 AM

## 2021-01-07 NOTE — Progress Notes (Signed)
ANTICOAGULATION CONSULT NOTE - Initial Consult  Pharmacy Consult for Heparin Indication: DVT/PE, concurrent CVA  Allergies  Allergen Reactions  . Aspirin     Avoid due to stomach issues  . Cephalexin     Stomach cramps  . Imipenem     Warm and tingly all over  . Losartan Potassium-Hctz     Other reaction(s): do not remember  . Oxycodone     Hallucinations   . Statins     leg pain, muscle cramps  . Clindamycin/Lincomycin Nausea And Vomiting and Rash  . Contrast Media [Iodinated Diagnostic Agents] Rash  . Penicillins Rash    Did it involve swelling of the face/tongue/throat, SOB, or low BP? No Did it involve sudden or severe rash/hives, skin peeling, or any reaction on the inside of your mouth or nose? Yes Did you need to seek medical attention at a hospital or doctor's office? No When did it last happen?2011 If all above answers are "NO", may proceed with cephalosporin use.   . Quinolones Rash    Patient Measurements: Height: 5\' 5"  (165.1 cm) Weight: 72.6 kg (160 lb) IBW/kg (Calculated) : 57 Heparin Dosing Weight: total weight  Vital Signs: Temp: 97.7 F (36.5 C) (04/19 2355) Temp Source: Axillary (04/19 2355) BP: 145/78 (04/20 0700) Pulse Rate: 71 (04/20 0700)  Labs: Recent Labs    01/05/21 0307 01/06/21 0245 01/06/21 1419 01/07/21 0237  HGB 4.3* 4.5* 7.5* 7.1*  HCT 13.5* 13.5* 21.0* 18.9*  PLT 266 234  --  214  CREATININE 0.66 0.65  --  0.64    Estimated Creatinine Clearance: 63.4 mL/min (by C-G formula based on SCr of 0.64 mg/dL).   Medical History: Past Medical History:  Diagnosis Date  . Dysphagia   . HYPERTHYROIDISM   . LYMPHADENOPATHY   . MIGRAINE HEADACHE   . 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 sodium chloride   Intravenous Once  . Chlorhexidine Gluconate Cloth  6 each Topical Daily  . famciclovir  500 mg Oral Daily   . fluconazole  100 mg Oral Daily  . folic acid  2 mg Oral Daily  . gabapentin  300 mg Oral 2 times per day   And  . gabapentin  600 mg Oral QHS  . levothyroxine  112 mcg Oral Q0600  . mouth rinse  15 mL Mouth Rinse BID  . multivitamin with minerals  1 tablet Oral Daily  . pantoprazole  40 mg Oral BID  . polycarbophil  1,250 mg Oral Q lunch  . predniSONE  80 mg Oral Q breakfast  . sertraline  50 mg Oral Daily  . sucralfate  1 g Oral BID   Infusions:   PRN: HYDROcodone-acetaminophen, LORazepam  Assessment: 75 yo female with severe autoimmune hemolytic anemiaofunknown etiology.  Hgb 5.3 on admit, down as low ast 4.3 - transfused 4/16 and again 4/19.  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  Pharmacy is consulted to dose IV heparin.  Goal of Therapy:  Heparin level 0.3-0.5 units/ml Monitor platelets by anticoagulation protocol: Yes   Plan:   No heparin bolus with concurrent CVA and anemia  Start IV heparin infusion 850 units/hr (~12 units/kg/hr)  Check heparin level 8hrs after starting  Daily CBC  Monitor very closely for signs/symptoms of bleeding  5/18, PharmD, BCPS Pharmacy: [phone] 01/07/2021,7:24  AM

## 2021-01-07 NOTE — Progress Notes (Signed)
  Speech Language Pathology Treatment: Dysphagia  Patient Details Name: [name] MRN: [id number] DOB: [birth year] Today's Date: 01/07/2021 Time: 1650-1700 SLP Time Calculation (min) (ACUTE ONLY): 10 min  Assessment / Plan / Recommendation Clinical Impression  Pt seen for skilled SLP to assure po tolerance and for education. Pt fully alert today and very responsive with no dysarthria or weakness. She does admit to some dyspnea with intake and is on 8 liters of oxygen.  SLP observed pt with po of medicine with ice cream and water. NO indication of aspiration or significant dysphagia. Subtle throat clearing observed after swallowing ice cream. Pt does have h/o GERD, pyloric stenosis requiring dilatation in 08/2020.  Advised pt continue diet with strict precautions- assuring adequate rest breaks if dyspneic, drinking liquids during meals and staying upright after - using teach back and written precautions.  Pt has made tremendous progress in swallowing with much improved mentation.  Will follow up once more preferrably during meal to assess for respiratory/swallow reciprocity for airway protection.    HPI HPI: Per Neuro notes "[name] is a 75 y.o. female with a medical history significant for dysphagia, hyperthyroidism, polyneuropathy, and migraines with recent admission to [facility] for evaluation of generalized weakness and dizziness and found to have a leukocytosis with a WBC of 24.7K and a hemoglobin of 5.7. 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 She received a blood transfusion on 4/16 but unfortunately had a transfusion reaction. On 4/18, she was being taken for a bone marrow biopsy to evaluate for a marrow malignancy when she became acutely aphasic and dysarthric and the procedure was cancelled for brain imaging. MRI brain revealed multiple acute infarcts and neurology was consulted for further evaluation and management with plans for patient to  transfer to [facility]."    MRI 4/18 There is cortical/subcortical restricted diffusion in the  right parietal lobe extending into the posterior temporal lobe.   Small area of involvement is present in the left frontal lobe near   the operculum. Punctate foci are also present within the left   parietal lobe and right occipital lobe. UGI 03/2016 Small sliding hiatal hernia.   5. Single episode of spontaneous gastroesophageal reflux to the   level of the clavicles with slow clearing of the barium following   reflux.   6. Mild intermittent tertiary peristaltic activity in the esophagus.  Pt has endoscopy with balloon diliatation completed 08/2020.  Per spouse, pt manages bills at home on the computer, SLP advised he learn to manage as back up.  Spouse also endorses that pt becomes short of breath when walking in the house at home.  Swallow evaluation ordered.      SLP Plan  Continue with current plan of care       Recommendations  Diet recommendations: Dysphagia 3 (mechanical soft);Thin liquid Liquids provided via: Cup;No straw Medication Administration: Whole meds with puree Compensations: Slow rate;Small sips/bites Postural Changes and/or Swallow Maneuvers: Seated upright 90 degrees;Upright 30-60 min after meal                Oral Care Recommendations: Oral care BID SLP Visit Diagnosis: Dysphagia, oropharyngeal phase (R13.12) Plan: Continue with current plan of care       GO                Macario Golds 01/07/2021, 5:46 PM  Kathleen Lime, MS [facility] SLP Acute Rehab Services Office [phone] Pager [phone]

## 2021-01-07 NOTE — Progress Notes (Signed)
Unfortunately, we have a new problem now.  She has thromboembolic disease in her left leg.  She has an acute thrombus in the left leg.  There is an indeterminate thrombus in the right leg.  She may have a pulmonary embolism by the echocardiogram report.  She must go on anticoagulation now.  We will have her on heparin.  I think this would be reasonable for right now until we know that all invasive procedures are done.  She did get a transfusion yesterday.  She did okay with this.  She had a least incompatible blood.  We should be able to transfuse again if we needed to.  Her hemoglobin is up to 7.1.  Her white cell count is 14.2.  Platelet count 214,000.  The reticulocyte count is 25%.  Her total bilirubin is 1.3.  LDH is 390.  Neurologically, she seems to be doing pretty well.  She is talking to me okay.  She is moving all extremities.  She denies any count of visual changes.  I suspect she is  Need some kind of physical therapy.  She might be getting speech therapy.  At some point, the bone marrow is going to have to be done again.  I suspect this probably go to be next week.  Hopefully, the prednisone and the IVIG that she had received is starting to help.  I think the reticulocyte count will give [place] a good idea as to what is going on.  Her vital signs show temperature 97.7.  Pulse 68.  Blood pressure 119/65.  Her lungs are clear bilaterally.  Cardiac exam regular rate and rhythm.  Abdomen is soft.  Bowel sounds are present.  She has no fluid wave.  She has no palpable liver or spleen tip.  Extremities she has no obvious venous cord in her legs.  There is no swelling in her legs that I can tell.  She has good range of motion of her joints.  Neurological exam shows no focal neurological deficits.  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  She has had the CVA.  She has had the thromboembolic disease in her legs.  She may have a pulmonary embolism.  I wonder if she has some kind of  underlying hypercoagulable issue.  I wonder if she Mannam have an element of the anticardiolipin antibody syndrome.  We will have to check for any thrombophilic state.  Again, I think she needs IV heparin for right now.  The aspirin will be stopped.  Christin Bach, MD  Philippians 4:13

## 2021-01-07 NOTE — Progress Notes (Signed)
ANTICOAGULATION CONSULT NOTE - Follow Up Consult  Pharmacy Consult for Heparin Indication: DVT/PE, concurrent CVA  Patient Measurements: Height: 5\' 5"  (165.1 cm) Weight: 72.6 kg (160 lb) IBW/kg (Calculated) : 57 Heparin Dosing Weight: 71.6 kg   Medications:  Infusions:  . heparin 850 Units/hr (01/07/21 1213)    Assessment: Brief note, see full assessment earlier today from 01/09/21, PharmD. Summary:  38 yoF on heparin for new PE/DVT and concurrent CVA and severe autoimmune hemolytic anemia.  Heparin level 0.12, subtherapeutic at first level on heparin 850 units/hr (no bolus) No bleeding or complications reported.  Goal of Therapy:  Heparin level 0.3-0.5 units/ml Monitor platelets by anticoagulation protocol: Yes   Plan:  No bolus Increase to heparin IV infusion at 1000 units/hr Heparin level in 8 hours  Daily heparin level and CBC Continue to monitor closely for s/s bleeding   61 PharmD, BCPS Clinical Pharmacist [facility] main pharmacy [phone] 01/07/2021 2:29 PM

## 2021-01-08 DIAGNOSIS — D649 Anemia, unspecified: Secondary | ICD-10-CM | POA: Diagnosis not present

## 2021-01-08 DIAGNOSIS — E039 Hypothyroidism, unspecified: Secondary | ICD-10-CM | POA: Diagnosis not present

## 2021-01-08 DIAGNOSIS — I63413 Cerebral infarction due to embolism of bilateral middle cerebral arteries: Secondary | ICD-10-CM | POA: Diagnosis not present

## 2021-01-08 DIAGNOSIS — I82402 Acute embolism and thrombosis of unspecified deep veins of left lower extremity: Secondary | ICD-10-CM | POA: Diagnosis not present

## 2021-01-08 DIAGNOSIS — D589 Hereditary hemolytic anemia, unspecified: Secondary | ICD-10-CM | POA: Diagnosis not present

## 2021-01-08 DIAGNOSIS — D72829 Elevated white blood cell count, unspecified: Secondary | ICD-10-CM | POA: Diagnosis not present

## 2021-01-08 LAB — RETICULOCYTES
Immature Retic Fract: 36.5 % — ABNORMAL HIGH (ref 2.3–15.9)
RBC.: 1.89 MIL/uL — ABNORMAL LOW (ref 3.87–5.11)
Retic Count, Absolute: 531.5 10*3/uL — ABNORMAL HIGH (ref 19.0–186.0)
Retic Ct Pct: 28.1 % — ABNORMAL HIGH (ref 0.4–3.1)

## 2021-01-08 LAB — LUPUS ANTICOAGULANT PANEL
DRVVT: 42.6 s (ref 0.0–47.0)
PTT Lupus Anticoagulant: 30.2 s (ref 0.0–51.9)

## 2021-01-08 LAB — CBC
HCT: 21.7 % — ABNORMAL LOW (ref 36.0–46.0)
Hemoglobin: 6.8 g/dL — CL (ref 12.0–15.0)
MCH: 36.4 pg — ABNORMAL HIGH (ref 26.0–34.0)
MCHC: 31.3 g/dL (ref 30.0–36.0)
MCV: 116 fL — ABNORMAL HIGH (ref 80.0–100.0)
Platelets: 158 10*3/uL (ref 150–400)
RBC: 1.87 MIL/uL — ABNORMAL LOW (ref 3.87–5.11)
RDW: 30.4 % — ABNORMAL HIGH (ref 11.5–15.5)
WBC: 12.8 10*3/uL — ABNORMAL HIGH (ref 4.0–10.5)
nRBC: 15.4 % — ABNORMAL HIGH (ref 0.0–0.2)

## 2021-01-08 LAB — PROTEIN C, TOTAL: Protein C, Total: 104 % (ref 60–150)

## 2021-01-08 LAB — HOMOCYSTEINE: Homocysteine: 4.8 umol/L (ref 0.0–19.2)

## 2021-01-08 LAB — CARDIOLIPIN ANTIBODIES, IGG, IGM, IGA
Anticardiolipin IgA: <9 APL U/mL (ref 0–11)
Anticardiolipin IgG: <9 GPL U/mL (ref 0–14)
Anticardiolipin IgM: 17 MPL U/mL — ABNORMAL HIGH (ref 0–12)

## 2021-01-08 LAB — HEMOGLOBIN AND HEMATOCRIT, BLOOD
HCT: 23.3 % — ABNORMAL LOW (ref 36.0–46.0)
Hemoglobin: 8.1 g/dL — ABNORMAL LOW (ref 12.0–15.0)

## 2021-01-08 LAB — PROTEIN S, TOTAL: Protein S Ag, Total: 82 % (ref 60–150)

## 2021-01-08 LAB — HEPARIN LEVEL (UNFRACTIONATED)
Heparin Unfractionated: 0.46 IU/mL (ref 0.30–0.70)
Heparin Unfractionated: 0.83 IU/mL — ABNORMAL HIGH (ref 0.30–0.70)

## 2021-01-08 LAB — BETA-2-GLYCOPROTEIN I ABS, IGG/M/A
Beta-2 Glyco I IgG: <9 GPI IgG units (ref 0–20)
Beta-2-Glycoprotein I IgA: <9 GPI IgA units (ref 0–25)
Beta-2-Glycoprotein I IgM: <9 GPI IgM units (ref 0–32)

## 2021-01-08 LAB — PROTEIN C ACTIVITY: Protein C Activity: 135 % (ref 73–180)

## 2021-01-08 LAB — PROTEIN S ACTIVITY: Protein S Activity: 58 % — ABNORMAL LOW (ref 63–140)

## 2021-01-08 LAB — PREPARE RBC (CROSSMATCH)

## 2021-01-08 MED ORDER — SODIUM CHLORIDE 0.9% IV SOLUTION: Freq: Once | INTRAVENOUS | Status: DC

## 2021-01-08 MED ORDER — SALINE SPRAY 0.65 % NA SOLN
1.0000 | NASAL | Status: DC | PRN
  Administered 2021-01-08 – 2021-01-10 (×2): 1 via NASAL
  Filled 2021-01-08: qty 44

## 2021-01-08 MED ORDER — ACETAMINOPHEN 325 MG PO TABS: 650.0000 mg | ORAL_TABLET | Freq: Once | ORAL | Status: DC

## 2021-01-08 MED ORDER — DIPHENHYDRAMINE HCL 50 MG/ML IJ SOLN: 25.0000 mg | Freq: Once | INTRAMUSCULAR | Status: DC

## 2021-01-08 NOTE — Progress Notes (Addendum)
STROKE TEAM PROGRESS NOTE   INTERVAL HISTORY No family is at the bedside.  Patient lying in bed, stated some nose and chest congestion but no SOB. No other complains. Her quadrantanopia seems resolved today. She still has anemia with Hb 6.8 but does not need transfusion at this time per Dr. Marin Olp. On heparin IV tolerating well.   Vitals:   01/08/21 1500 01/08/21 1600 01/08/21 1900 01/08/21 1920  BP: (!) 106/57 132/68 135/64   Pulse: 64 66 67   Resp: (!) 23 17 (!) 24   Temp:  98.6 F (37 C)  98 F (36.7 C)  TempSrc:  Axillary  Oral  SpO2: 96% 98% 99%   Weight:      Height:       CBC:  Recent Labs  Lab 01/02/21 1056 01/02/21 1400 01/05/21 0307 01/06/21 0245 01/07/21 0237 01/07/21 2028 01/08/21 0246 01/08/21 0908  WBC 24.7*   < > 19.5*   < > 14.2*  --  12.8*  --   NEUTROABS 20.6*  --  15.5*  --   --   --   --   --   HGB 5.7*   < > 4.3*   < > 7.1*   < > 6.8* 8.1*  HCT 15.9*   < > 13.5*   < > 18.9*   < > 21.7* 23.3*  MCV 109.7*   < > 129.8*   < > 130.3*  --  116.0*  --   PLT 522*   < > 266   < > 214  --  158  --    < > = values in this interval not displayed.   Basic Metabolic Panel:  Recent Labs  Lab 01/06/21 0245 01/07/21 0237  NA 137 137  K 3.9 4.2  CL 109 108  CO2 22 21*  GLUCOSE 131* 138*  BUN 19 18  CREATININE 0.65 0.64  CALCIUM 8.4* 8.6*   Lipid Panel:  Recent Labs  Lab 01/05/21 0317  CHOL 158  TRIG 70  HDL 44  CHOLHDL 3.6  VLDL 14  LDLCALC 100*   HgbA1c:  Recent Labs  Lab 01/05/21 0317  HGBA1C 3.5*   IMAGING and DIAGNOSTICS  MR BRAIN 4/18 Acute infarcts of the right parietotemporal lobes greater than left frontal lobe. Additional punctate left parietal and right occipital acute infarcts. Mild chronic microvascular ischemic changes. Few chronic Microhemorrhages.  MRA BRAIN 4/18 No intracranial large vessel occlusion or proximal high-grade arterial stenosis.  [place] LOWER EXTREMITIES 4/19 BILATERAL:  -No evidence of popliteal cyst,  bilaterally.  RIGHT:  - Findings consistent with acute deep vein thrombosis involving the right  peroneal veins.    LEFT:  - Findings consistent with age indeterminate deep vein thrombosis  involving the left popliteal vein.   CAROTID US 4/19 Right Carotid: The extracranial vessels were near-normal with only minimal wall thickening or plaque.   Left Carotid: The extracranial vessels were near-normal with only minimal wall thickening or plaque.   Vertebrals: Bilateral vertebral arteries demonstrate antegrade flow.   Subclavians: Normal flow hemodynamics were seen in bilateral subclavian  arteries.   ECHO 4/19 1. Left ventricular ejection fraction, by estimation, is 60 to 65%. The left ventricle has normal function. The left ventricle has no regional wall motion abnormalities. Left ventricular diastolic parameters are consistent with Grade I diastolic  dysfunction (impaired relaxation).  2. McConnell's sign appears to be present.. Right ventricular systolic function is mildly reduced. The right ventricular size is moderately enlarged. There is  moderately elevated pulmonary artery systolic pressure.  3. Right atrial size was moderately dilated.  4. The mitral valve is normal in structure. Mild mitral valve  regurgitation. No evidence of mitral stenosis.  5. Tricuspid valve regurgitation is moderate.  6. The aortic valve is tricuspid. Aortic valve regurgitation is not  visualized. No aortic stenosis is present.  7. The inferior vena cava is dilated in size with <50% respiratory  variability, suggesting right atrial pressure of 15 mmHg.   PHYSICAL EXAM  Temp:  [97.1 F (36.2 C)-98.6 F (37 C)] 98 F (36.7 C) (04/21 1920) Pulse Rate:  [48-99] 67 (04/21 1900) Resp:  [17-26] 24 (04/21 1900) BP: (106-157)/(47-120) 135/64 (04/21 1900) SpO2:  [79 %-100 %] 99 % (04/21 1900)  General - Well nourished, well developed, in mild respiratory distress.  Ophthalmologic - fundi not  visualized due to noncooperation.  Cardiovascular - Regular rhythm and rate.  Neuro - awake, alert, eyes open, orientated to age, place, time, but mild psychomotor slowing. No significant aphasia except intermittent hesitation with speech and paraphasic errors, following all simple commands. Able to name and repeat. No gaze palsy, tracking bilaterally, visual field full today, PERRL.  Slight right nasolabial fold flattening. Tongue midline. Bilateral UEs 5/5, no drift. Bilaterally LEs 5/5, no drift. Sensation symmetrical bilaterally, b/l FTN intact, gait not tested.    ASSESSMENT/PLAN [name] is a 75 y.o. female with a medical history significant for dysphagia, hyperthyroidism, polyneuropathy, and migraines with recent admission to [facility] for evaluation of generalized weakness and dizziness and found to have a leukocytosis with a WBC of 24.7K and a hemoglobin of 5.7. 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 She received a blood transfusion on 4/16 but unfortunately had a transfusion reaction. On 4/18, she was being taken for a bone marrow biopsy to evaluate for a marrow malignancy when she became acutely aphasic and dysarthric and the procedure was cancelled for brain imaging. MRI brain revealed multiple acute infarcts and neurology was consulted for further evaluation and management with plans for patient to transfer to [facility]. However, patient deemed not stable enough for transfer so has remained at [facility].    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 Paradoxical emboli with PE and DVT if PFO present   MRI brain Acute embolic infarcts of the right parietotemporal lobes greater than left frontal lobe. Additional punctate left parietal and right occipital acute infarcts.  MRA unremarkable  2D  echo EF 60 to 65% but McConnell's sign present  LE venous Doppler showed acute right peroneal DVT, age indeterminant left popliteal DVT  Carotid Doppler unremarkable  LDL 100  A1c 3.5  Hypercoagulable lab not significant. Protein S activity mildly low largely due to on heparin IV.  VTE prophylaxis - SCDs  No AC/AP prior to admission, currently heparin IV. 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 Marin Olp  PE/DVT  2D echo McConnell's sign present  LE venous Doppler showed acute right peroneal DVT, age indeterminant left popliteal DVT  May consider TCD bubble study to rule out PFO as outpt with neuro follow up as it will not change management now  On heparin IV. Further AC regimen per hem/onc  Hyperlipidemia  Home meds:  none  LDL 100, goal < 70  Hold off statin in setting of acute hemolytic anemia and potential liver toxicity  Other Stroke Risk Factors  Advanced Age >/= 44   Former Cigarette smoke  Family hx stroke (father)  Migraines  Other Active Problems managed by primary team  Questionable bacteremia vs false positive culture, holding abx and following repeat cultures   Hospital day # 5  Neurology will sign off. Please call with questions. Pt will follow up with Dr. Jaynee Eagles at [facility] in about 4 weeks. Thanks for the consult.   Rosalin Hawking, MD PhD Stroke Neurology 01/08/2021 7:57 PM   To contact Stroke Continuity provider, please refer to [email or web address] After hours, contact General Neurology

## 2021-01-08 NOTE — Progress Notes (Signed)
PROGRESS NOTE    DEBERAH ADOLF  YIA:[id number] DOB: 1946-07-09 DOA: 01/02/2021 PCP: Lorene Dy, MD   Brief Narrative:  This 75 years old female with PMH significant for hypothyroidism and polyneuropathy who presented in the ED with generalized weakness.  Upon ED evaluation she was found to be anemic with a hemoglobin of 5.7 down from 13.0 which is her baseline. She was also found to have elevated bilirubin mostly indirect, elevated LDH and elevated D-dimer.  CT abdomen and pelvis was normal. Stool for occult blood negative.  chest x-ray was normal. 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  Hematology was consulted,  Patient started on prednisone, famciclovir and Diflucan.  She is also found to have  stroke, neurology was consulted.  Patient is started on heparin gtt. for confirmed DVT and suspected PE on echocardiogram.  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 Stool for occult blood negative, found to have elevated LDH, elevated bilirubin. 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 Continue prednisone, folic acid, IVIG given on 01/05/2021  Blood transfusion completed on 4/19 without overt incident - 1u PRBC since admission Patient remains high risk for transfusion reaction, will attempt to pretreat with antihistamine per protocol  Mild transfusion reaction on 4/16 -blood transfusion stopped immediately without any prolonged issues or incidents She was scheduled for bone marrow biopsy on the 18th,  this was canceled in the setting of acute stroke,  will need to reattempt in the next 24 to 48 hours once patient is more stable.  Reticulocyte count improving appropriately with treatment  14.7>>17.1>>21.8>>28.6 Plan: Bone marrow biopsy when stable.  Hemoglobin 7.1> 6.8 > 8.1  Acute ischemic vs embolic stroke in the setting of above: On 01/05/2021 while in route to CT scanner for bone marrow biopsy,  patient had mental status changes with slurred speech and confusion. Code stroke was called, discussed with teleneurology, MRI shows - "Acute infarcts of the right parietotemporal lobes greater than left frontal lobe. Additional punctate left parietal and right occipital acute infarcts" Neurology consulted given atypical appearance, concern for embolic disease, suggest transfer to [facility] main hospital for further evaluation with neurology, May require TEE pending cultures versus other findings.  Family updated at bedside. Hold off on pressors --> small boluses of normal saline to keep MAP greater than 65 per discussion with neurology. Previous plan was to transfer to ICU at main campus on hold given ongoing hematological issues as above. Echo: LVEF 60-65%, no regional wall motion abnormalities. McConnell's sign appears to be present.. Right ventricular systolic function is mildly reduced. The right ventricular size is moderately enlarged. There is moderately elevated pulmonary artery systolic pressure.  Continue heparin gtt. further anticoagulation regimen per heme-onc.  DVT and suspected PE DVT on BLE US Echo consistent with PE Hematology recommended anticoagulation.  He recommended discontinuation of aspirin if anticoagulation is started. Continue heparin gtt. with the plan to transition to oral DOAC.  Questionable bacteremia vs false positive culture Patient does not meet sepsis criteria. Staph epi 1/2 preliminary - hold abx and follow repeat cultures Echo no vegetation,  may require TEE to rule out vegetations Will consult ID if repeat cultures positive given unclear source although bacteremia/vegetations may explain embolic disease.  Hypothyroidism Continue Synthroid at  current dose.  Anxiety Continue Zoloft at home dose. Ativan as needed for anxiety.  Polyneuropathy Continue gabapentin    DVT prophylaxis: Heparin gtt Code Status:  Full code. Family Communication:  No family at bed side. Disposition Plan:   Status is: Inpatient  Remains inpatient appropriate because:Inpatient level of care appropriate due to severity of illness   Dispo: The patient is from: Home              Anticipated d/c is to: Home              Patient currently is not medically stable to d/c.   Difficult to place patient No   Consultants:   Hematology  Neurology  Procedures: TTE on 4/19. TEE pending Bone marrow biopsy pending  Antimicrobials:   Anti-infectives (From admission, onward)   Start     Dose/Rate Route Frequency Ordered Stop   01/02/21 1800  fluconazole (DIFLUCAN) tablet 100 mg        100 mg Oral Daily 01/02/21 1713     01/02/21 1800  famciclovir (FAMVIR) tablet 500 mg        500 mg Oral Daily 01/02/21 1713        Subjective: Patient was seen and examined at bedside.  Overnight events noted.  Patient reports feeling better.  He is sitting comfortably on the chair having breakfast.  She is weaned down to 6 L/min sats 94%.  Patient has participated in physical therapy. She denies any pain.  Objective: Vitals:   01/08/21 0500 01/08/21 0600 01/08/21 0700 01/08/21 0800  BP: 122/63 (!) 112/56 (!) 147/81   Pulse: (!) 52 (!) 48 62   Resp: 19 (!) 22 (!) 21   Temp:    (!) 97.1 F (36.2 C)  TempSrc:    Axillary  SpO2: 99% 100% 99%   Weight:      Height:        Intake/Output Summary (Last 24 hours) at 01/08/2021 1040 Last data filed at 01/08/2021 0800 Gross per 24 hour  Intake 1060.19 ml  Output --  Net 1060.19 ml   Filed Weights   01/02/21 1035  Weight: 72.6 kg    Examination:  General exam: Appears calm and comfortable, not in any acute distress. Respiratory system: Clear to auscultation. Respiratory effort normal. Cardiovascular  system: S1 & S2 heard, RRR. No JVD, murmurs, rubs, gallops or clicks. No pedal edema. Gastrointestinal system: Abdomen is nondistended, soft and nontender. No organomegaly or masses felt. Normal bowel sounds heard. Central nervous system: Alert and oriented. No focal neurological deficits. Extremities: Symmetric 5 x 5 power.  No edema, no cyanosis, no clubbing. Skin: No rashes, lesions or ulcers Psychiatry: Judgement and insight appear normal. Mood & affect appropriate.     Data Reviewed: I have personally reviewed following labs and imaging studies  CBC: Recent Labs  Lab 01/02/21 1056 01/02/21 1400 01/04/21 0244 01/05/21 0307 01/06/21 0245 01/06/21 1419 01/07/21 0237 01/07/21 2028 01/08/21 0246 01/08/21 0908  WBC 24.7*   < > 19.3* 19.5* 16.3*  --  14.2*  --  12.8*  --   NEUTROABS 20.6*  --   --  15.5*  --   --   --   --   --   --   HGB 5.7*   < > 4.3* 4.3* 4.5* 7.5* 7.1* 7.4* 6.8* 8.1*  HCT 15.9*   < > 13.2* 13.5* 13.5* 21.0* 18.9* 23.1* 21.7* 23.3*  MCV 109.7*   < > 116.8* 129.8* 145.2*  --  130.3*  --  116.0*  --   PLT 522*   < > 339 266 234  --  214  --  158  --    < > = values in this interval not displayed.   Basic Metabolic Panel: Recent Labs  Lab 01/03/21 0341 01/04/21 0244 01/05/21 0307 01/06/21 0245 01/07/21 0237  NA 137 138 137 137 137  K 4.0 3.8 4.1 3.9 4.2  CL 109 109 108 109 108  CO2 21* 21* 22 22 21*  GLUCOSE 107* 132* 125* 131* 138*  BUN 18 16 19 19 18   CREATININE 0.56 0.59 0.66 0.65 0.64  CALCIUM 8.5* 8.6* 8.4* 8.4* 8.6*   GFR: Estimated Creatinine Clearance: 63.4 mL/min (by C-G formula based on SCr of 0.64 mg/dL). Liver Function Tests: Recent Labs  Lab 01/03/21 0341 01/04/21 0244 01/05/21 0307 01/06/21 0245 01/07/21 0237  AST 29 27 22 31  41  ALT 20 24 23 31  34  ALKPHOS 46 45 39 44 46  BILITOT 5.3* 2.3* 1.1 0.9 1.3*  PROT 5.8* 5.9* 7.2 8.4* 8.5*  ALBUMIN 3.4* 3.3* 3.1* 3.1* 3.3*   Recent Labs  Lab 01/02/21 1056  LIPASE 37   No  results for input(s): AMMONIA in the last 168 hours. Coagulation Profile: Recent Labs  Lab 01/02/21 1400  INR 1.1   Cardiac Enzymes: No results for input(s): CKTOTAL, CKMB, CKMBINDEX, TROPONINI in the last 168 hours. BNP (last 3 results) No results for input(s): PROBNP in the last 8760 hours. HbA1C: No results for input(s): HGBA1C in the last 72 hours. CBG: Recent Labs  Lab 01/05/21 1021  GLUCAP 111*   Lipid Profile: No results for input(s): CHOL, HDL, LDLCALC, TRIG, CHOLHDL, LDLDIRECT in the last 72 hours. Thyroid Function Tests: No results for input(s): TSH, T4TOTAL, FREET4, T3FREE, THYROIDAB in the last 72 hours. Anemia Panel: Recent Labs    01/07/21 0237 01/08/21 0246  RETICCTPCT 25.4* 28.1*   Sepsis Labs: Recent Labs  Lab 01/02/21 1612 01/02/21 1851  LATICACIDVEN 2.5* 3.2*    Recent Results (from the past 240 hour(s))  Resp Panel by RT-PCR (Flu A&B, Covid) Nasopharyngeal Swab     Status: None   Collection Time: 01/02/21 12:15 PM   Specimen: Nasopharyngeal Swab; Nasopharyngeal(NP) swabs in vial transport medium  Result Value Ref Range Status   SARS Coronavirus 2 by RT PCR NEGATIVE NEGATIVE Final    Comment: (NOTE) SARS-CoV-2 target nucleic acids are NOT DETECTED.  The SARS-CoV-2 RNA is generally detectable in upper respiratory specimens during the acute phase of infection. The lowest concentration of SARS-CoV-2 viral copies this assay can detect is 138 copies/mL. A negative result does not preclude SARS-Cov-2 infection and should not be used as the sole basis for treatment or other patient management decisions. A negative result may occur with  improper specimen collection/handling, submission of specimen other than nasopharyngeal swab, presence of viral mutation(s) within the areas targeted by this assay, and inadequate number of viral copies(<138 copies/mL). A negative result must be combined with clinical observations, patient history, and  epidemiological information. The expected result is Negative.  Fact Sheet for Patients:  EntrepreneurPulse.com.au  Fact Sheet for Healthcare Providers:  IncredibleEmployment.be  This test is no t yet approved or cleared by the [place] FDA and  has been authorized for detection and/or diagnosis of SARS-CoV-2 by FDA under an Emergency Use Authorization (EUA). This EUA will remain  in effect (meaning this test can be used) for the duration of the COVID-19 declaration under Section 564(b)(1)  of the Act, 21 U.S.C.section 360bbb-3(b)(1), unless the authorization is terminated  or revoked sooner.       Influenza A by PCR NEGATIVE NEGATIVE Final   Influenza B by PCR NEGATIVE NEGATIVE Final    Comment: (NOTE) The Xpert Xpress SARS-CoV-2/FLU/RSV plus assay is intended as an aid in the diagnosis of influenza from Nasopharyngeal swab specimens and should not be used as a sole basis for treatment. Nasal washings and aspirates are unacceptable for Xpert Xpress SARS-CoV-2/FLU/RSV testing.  Fact Sheet for Patients: EntrepreneurPulse.com.au  Fact Sheet for Healthcare Providers: IncredibleEmployment.be  This test is not yet approved or cleared by the [place] FDA and has been authorized for detection and/or diagnosis of SARS-CoV-2 by FDA under an Emergency Use Authorization (EUA). This EUA will remain in effect (meaning this test can be used) for the duration of the COVID-19 declaration under Section 564(b)(1) of the Act, 21 U.S.C. section 360bbb-3(b)(1), unless the authorization is terminated or revoked.  Performed at [facility], [place] [address], [place], [place] [address]   MRSA PCR Screening     Status: None   Collection Time: 01/03/21  6:24 PM   Specimen: Nasal Mucosa; Nasopharyngeal  Result Value Ref Range Status   MRSA by PCR NEGATIVE NEGATIVE Final    Comment:        The  GeneXpert MRSA Assay (FDA approved for NASAL specimens only), is one component of a comprehensive MRSA colonization surveillance program. It is not intended to diagnose MRSA infection nor to guide or monitor treatment for MRSA infections. Performed at [facility], [place] [address], [place], [place] [address]   Culture, blood (routine x 2)     Status: None (Preliminary result)   Collection Time: 01/05/21  3:18 PM   Specimen: BLOOD LEFT FOREARM  Result Value Ref Range Status   Specimen Description   Final    BLOOD LEFT FOREARM Performed at [facility] [address] [place], [place] [address]    Special Requests   Final    BOTTLES DRAWN AEROBIC ONLY Blood Culture adequate volume Performed at [facility] [address], [place], [place] [address]    Culture   Final    NO GROWTH 3 DAYS Performed at [facility], [place] [address] [place], [place] [address]    Report Status PENDING  Incomplete  Culture, blood (routine x 2)     Status: Abnormal   Collection Time: 01/05/21  3:18 PM   Specimen: BLOOD  Result Value Ref Range Status   Specimen Description   Final    BLOOD LEFT ANTECUBITAL Performed at [facility] [address], [place], [place] [address]    Special Requests   Final    BOTTLES DRAWN AEROBIC ONLY Blood Culture adequate volume Performed at [facility] [address] [place], [place] [address]    Culture  Setup Time   Final    GRAM POSITIVE COCCI AEROBIC BOTTLE ONLY CRITICAL RESULT CALLED TO, READ BACK BY AND VERIFIED WITH: D,WOFFORD PHARMD @1411  01/06/21 EB    Culture (A)  Final    STAPHYLOCOCCUS EPIDERMIDIS THE SIGNIFICANCE OF ISOLATING THIS ORGANISM FROM A SINGLE SET OF BLOOD CULTURES WHEN MULTIPLE SETS ARE DRAWN IS UNCERTAIN. PLEASE NOTIFY THE MICROBIOLOGY DEPARTMENT WITHIN ONE WEEK IF SPECIATION AND SENSITIVITIES ARE REQUIRED. Performed at [facility], [place] [address] [place], [place] [address]    Report Status 01/07/2021 FINAL  Final  Blood Culture ID Panel (Reflexed)     Status: Abnormal   Collection Time: 01/05/21  3:18 PM  Result Value Ref Range Status   Enterococcus faecalis NOT DETECTED NOT DETECTED Final   Enterococcus Faecium NOT DETECTED NOT DETECTED Final   Listeria monocytogenes NOT DETECTED NOT DETECTED Final   Staphylococcus species DETECTED (A) NOT DETECTED Final    Comment: CRITICAL RESULT CALLED TO, READ BACK BY AND VERIFIED WITH: D,WOFFORD PHARMD @1411  01/06/21 EB    Staphylococcus aureus (BCID) NOT DETECTED NOT DETECTED Final   Staphylococcus epidermidis DETECTED (A) NOT DETECTED Final    Comment: Methicillin (oxacillin) resistant coagulase negative staphylococcus. Possible blood culture contaminant (unless isolated from more than one blood culture draw or clinical case suggests pathogenicity). No antibiotic treatment is indicated for blood  culture contaminants. CRITICAL RESULT CALLED TO, READ BACK BY AND VERIFIED WITH: D,WOFFORD PHARMD @1411  01/06/21 EB    Staphylococcus lugdunensis NOT DETECTED NOT DETECTED Final   Streptococcus species NOT DETECTED NOT DETECTED Final   Streptococcus agalactiae NOT DETECTED NOT DETECTED Final   Streptococcus pneumoniae NOT DETECTED NOT DETECTED Final   Streptococcus pyogenes NOT DETECTED NOT DETECTED Final   A.calcoaceticus-baumannii NOT DETECTED NOT DETECTED Final   Bacteroides fragilis NOT DETECTED NOT DETECTED Final   Enterobacterales NOT DETECTED NOT DETECTED Final   Enterobacter cloacae complex NOT DETECTED NOT DETECTED Final   Escherichia coli NOT DETECTED NOT DETECTED Final   Klebsiella aerogenes NOT DETECTED NOT DETECTED Final   Klebsiella oxytoca NOT DETECTED NOT DETECTED Final   Klebsiella pneumoniae NOT DETECTED NOT DETECTED Final   Proteus species NOT DETECTED NOT DETECTED Final   Salmonella species NOT DETECTED NOT DETECTED Final   Serratia marcescens  NOT DETECTED NOT DETECTED Final   Haemophilus influenzae NOT DETECTED NOT DETECTED Final   Neisseria meningitidis NOT DETECTED NOT DETECTED Final   Pseudomonas aeruginosa NOT DETECTED NOT DETECTED Final   Stenotrophomonas maltophilia NOT DETECTED NOT DETECTED Final   Candida albicans NOT DETECTED NOT DETECTED Final   Candida auris NOT DETECTED NOT DETECTED Final   Candida glabrata NOT DETECTED NOT DETECTED Final   Candida krusei NOT DETECTED NOT DETECTED Final   Candida parapsilosis NOT DETECTED NOT DETECTED Final   Candida tropicalis NOT DETECTED NOT DETECTED Final   Cryptococcus neoformans/gattii NOT DETECTED NOT DETECTED Final   Methicillin resistance mecA/C DETECTED (A) NOT DETECTED Final    Comment: CRITICAL RESULT CALLED TO, READ BACK BY AND VERIFIED WITH: D,WOFFORD PHARMD @1411  01/06/21 EB Performed at [facility], [address], [place], [place] [address]   Culture, blood (routine x 2)     Status: None (Preliminary result)   Collection Time: 01/06/21  4:05 PM   Specimen: BLOOD  Result Value Ref Range Status   Specimen Description   Final    BLOOD LEFT ANTECUBITAL Performed at [facility] [address], [place], [place] [address]    Special Requests   Final    BOTTLES DRAWN AEROBIC ONLY Blood Culture adequate volume Performed at [facility] [address], [place], [place] [address]    Culture   Final    NO GROWTH 2 DAYS Performed at [facility] [address] [place],  [address]    Report Status PENDING  Incomplete  Culture, blood (routine x 2)     Status: None (Preliminary result)   Collection Time: 01/06/21  4:05 PM   Specimen: BLOOD LEFT HAND  Result Value Ref Range Status  Specimen Description   Final    BLOOD LEFT HAND Performed at [facility] [address] [place], [place] [address]    Special Requests   Final    BOTTLES DRAWN AEROBIC ONLY Blood Culture results may not  be optimal due to an inadequate volume of blood received in culture bottles Performed at [facility] [address] [place], [place] [address]    Culture  Setup Time   Final    AEROBIC BOTTLE ONLY GRAM POSITIVE COCCI CRITICAL VALUE NOTED.  VALUE IS CONSISTENT WITH PREVIOUSLY REPORTED AND CALLED VALUE.    Culture   Final    GRAM POSITIVE COCCI IDENTIFICATION TO FOLLOW Performed at [facility], [place] [address] [place],  [address]    Report Status PENDING  Incomplete         Radiology Studies: ECHOCARDIOGRAM COMPLETE  Result Date: 01/06/2021    ECHOCARDIOGRAM REPORT   Patient Name:   YOCHEVED DEPNER Date of Exam: 01/06/2021 Medical Rec #:  [id number]      Height:       65.0 in Accession #:    [id number]     Weight:       160.0 lb Date of Birth:  06/08/1946      BSA:          1.799 m Patient Age:    36 years       BP:           95/59 mmHg Patient Gender: F              HR:           73 bpm. Exam Location:  Inpatient Procedure: 2D Echo, Color Doppler and Cardiac Doppler Indications:    Stroke I63.9  History:        Patient has prior history of Echocardiogram examinations, most                 recent 07/31/2013. Signs/Symptoms:Dyspnea; Risk                 Factors:Diabetes.  Sonographer:    Bernadene Person RDCS Referring Phys: Rudell Cobb ENNEVER IMPRESSIONS  1. Left ventricular ejection fraction, by estimation, is 60 to 65%. The left ventricle has normal function. The left ventricle has no regional wall motion abnormalities. Left ventricular diastolic parameters are consistent with Grade I diastolic dysfunction (impaired relaxation).  2. McConnell's sign appears to be present.. Right ventricular systolic function is mildly reduced. The right ventricular size is moderately enlarged. There is moderately elevated pulmonary artery systolic pressure.  3. Right atrial size was moderately dilated.  4. The mitral valve is normal in structure. Mild mitral valve regurgitation. No  evidence of mitral stenosis.  5. Tricuspid valve regurgitation is moderate.  6. The aortic valve is tricuspid. Aortic valve regurgitation is not visualized. No aortic stenosis is present.  7. The inferior vena cava is dilated in size with <50% respiratory variability, suggesting right atrial pressure of 15 mmHg. Comparison(s): Prior images unable to be directly viewed, comparison made by report only. Findings suggest cor pulmonale, possibly acute. Consider pulmonary embolism. Discussed with primary team. FINDINGS  Left Ventricle: Left ventricular ejection fraction, by estimation, is 60 to 65%. The left ventricle has normal function. The left ventricle has no regional wall motion abnormalities. The left ventricular internal cavity size was normal in size. There is  no left ventricular hypertrophy. Left ventricular diastolic parameters are consistent with Grade I diastolic  dysfunction (impaired relaxation). Normal left ventricular filling pressure. Right Ventricle: McConnell's sign appears to be present. The right ventricular size is moderately enlarged. No increase in right ventricular wall thickness. Right ventricular systolic function is mildly reduced. There is moderately elevated pulmonary artery systolic pressure. The tricuspid regurgitant velocity is 3.30 m/s, and with an assumed right atrial pressure of 15 mmHg, the estimated right ventricular systolic pressure is 19.4 mmHg. Left Atrium: Left atrial size was normal in size. Right Atrium: Right atrial size was moderately dilated. Prominent Eustachian valve. Pericardium: There is no evidence of pericardial effusion. Mitral Valve: The mitral valve is normal in structure. Mild mitral valve regurgitation, with centrally-directed jet. No evidence of mitral valve stenosis. Tricuspid Valve: The tricuspid valve is normal in structure. Tricuspid valve regurgitation is moderate . No evidence of tricuspid stenosis. Aortic Valve: The aortic valve is tricuspid. Aortic valve  regurgitation is not visualized. No aortic stenosis is present. Pulmonic Valve: The pulmonic valve was normal in structure. Pulmonic valve regurgitation is mild. No evidence of pulmonic stenosis. Aorta: The aortic root is normal in size and structure. Venous: The inferior vena cava is dilated in size with less than 50% respiratory variability, suggesting right atrial pressure of 15 mmHg. IAS/Shunts: No atrial level shunt detected by color flow Doppler.  LEFT VENTRICLE PLAX 2D LVIDd:         4.30 cm  Diastology LVIDs:         2.70 cm  LV e' medial:    7.27 cm/s LV PW:         0.80 cm  LV E/e' medial:  8.8 LV IVS:        0.80 cm  LV e' lateral:   12.90 cm/s LVOT diam:     1.80 cm  LV E/e' lateral: 5.0 LV SV:         55 LV SV Index:   30 LVOT Area:     2.54 cm  RIGHT VENTRICLE TAPSE (M-mode): 1.9 cm LEFT ATRIUM             Index       RIGHT ATRIUM           Index LA diam:        2.60 cm 1.45 cm/m  RA Area:     25.40 cm LA Vol (A2C):   37.8 ml 21.01 ml/m RA Volume:   96.80 ml  53.80 ml/m LA Vol (A4C):   30.6 ml 17.01 ml/m LA Biplane Vol: 35.6 ml 19.79 ml/m  AORTIC VALVE LVOT Vmax:   105.00 cm/s LVOT Vmean:  70.800 cm/s LVOT VTI:    0.215 m  AORTA Ao Root diam: 3.30 cm Ao Asc diam:  3.60 cm MITRAL VALVE               TRICUSPID VALVE MV Area (PHT): 2.29 cm    TR Peak grad:   43.6 mmHg MV Decel Time: 331 msec    TR Vmax:        330.00 cm/s MV E velocity: 64.30 cm/s MV A velocity: 81.40 cm/s  SHUNTS MV E/A ratio:  0.79        Systemic VTI:  0.22 m                            Systemic Diam: 1.80 cm Dani Gobble Croitoru MD Electronically signed by Sanda Klein MD Signature Date/Time: 01/06/2021/4:50:13 PM    Final    Scheduled Meds: .  Chlorhexidine Gluconate Cloth  6 each Topical Daily  . famciclovir  500 mg Oral Daily  . fluconazole  100 mg Oral Daily  . folic acid  2 mg Oral Daily  . gabapentin  300 mg Oral 2 times per day   And  . gabapentin  600 mg Oral QHS  . levothyroxine  112 mcg Oral Q0600  . mouth rinse   15 mL Mouth Rinse BID  . multivitamin with minerals  1 tablet Oral Daily  . pantoprazole  40 mg Oral BID  . polycarbophil  1,250 mg Oral Q lunch  . predniSONE  80 mg Oral Q breakfast  . sertraline  50 mg Oral Daily  . sucralfate  1 g Oral BID   Continuous Infusions: . heparin 1,000 Units/hr (01/07/21 1827)     LOS: 5 days    Time spent: 35 mins    Kushi Kun, MD [facility]   If 7PM-7AM, please contact night-coverage

## 2021-01-08 NOTE — Progress Notes (Signed)
ANTICOAGULATION CONSULT NOTE - Follow Up Consult  Pharmacy Consult for Heparin Indication: DVT/PE, concurrent CVA  Patient Measurements: Height: 5\' 5"  (165.1 cm) Weight: 72.6 kg (160 lb) IBW/kg (Calculated) : 57 Heparin Dosing Weight: 71.6 kg   Medications:  Infusions:  . heparin 1,000 Units/hr (01/07/21 1827)    Assessment: Brief note, see full assessment earlier today from 01/09/21, PharmD. Summary:  47 yoF on heparin for new PE/DVT and concurrent CVA and severe autoimmune hemolytic anemia. No bolus  HL 0.46, therapeutic on 1000 units/hr No bleeding or complications per RN  Goal of Therapy:  Heparin level 0.3-0.5 units/ml Monitor platelets by anticoagulation protocol: Yes   Plan:  Continue heparin IV infusion at 1000 units/hr Heparin level in 8 hours  Daily heparin level and CBC Continue to monitor closely for s/s bleeding   61 RPh 01/08/2021, 3:16 AM

## 2021-01-08 NOTE — Progress Notes (Signed)
ANTICOAGULATION CONSULT NOTE - Follow Up Consult  Pharmacy Consult for Heparin Indication: DVT/PE, concurrent CVA  Patient Measurements: Height: 5\' 5"  (165.1 cm) Weight: Tabitha.6 kg (160 lb) IBW/kg (Calculated) : 57 Heparin Dosing Weight: 71.6 kg   Medications:  Infusions:  . 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  Heparin level 0.12, subtherapeutic at first level on heparin 850 units/hr (no bolus) No bleeding or complications reported. Heparin increased to 1000 units/hr and 4/21 am level 0.46 IV access lost later, Heparin resumed ~ 1300 at 1000 units/hr 2030 Heparin level = 0.83, greatly elevated with same rate  Sample drawn via venipuncture, Heparin infusing in L upper arm, unfortunately the KVO IV fluid was infusing with the Heparin > increased Heparin rate.  IV administration setup corrected   Goal of Therapy:  Heparin level 0.3-0.5 units/ml Monitor platelets by anticoagulation protocol: Yes   Plan:   Continue Heparin infusion at 1000 units/hr Heparin level at 5am Daily heparin level and CBC Continue to monitor closely for s/s bleeding   5/21 PharmD 01/08/2021, 9:24 PM

## 2021-01-08 NOTE — Evaluation (Signed)
Occupational Therapy Evaluation Patient Details Name: [name] MRN: [id number] DOB: 09-Apr-1946 Today's Date: 01/08/2021    History of Present Illness [name] is a 75 y.o. female with a medical history significant for dysphagia, hyperthyroidism, polyneuropathy, and migraines with recent admission to [facility] for evaluation of generalized weakness and dizziness and found to have a leukocytosis with a WBC of 24.7K and a hemoglobin of 5.7. 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 She received a blood transfusion on 4/16 but unfortunately had a transfusion reaction. On 4/18, she was being taken for a bone marrow biopsy to evaluate for a marrow malignancy when she became acutely aphasic and dysarthric and the procedure was cancelled for brain imaging. MRI brain revealed multiple acute infarcts--Acute embolic infarcts of the right parietotemporal lobes greater than left frontal lobe. Additional punctate left parietal and right occipital acute infarcts.   Clinical Impression   This 75 yo female admitted and found to have above presents to acute OT with PLOF of totally independent including driving. Currently pt is min A-setup/S for all basic ADLs with decreased vision in right eye (right lower field) impacting her ability to use her cell phone for texting and calling people using touch screen--her scrolling ability is fine. She will continue to benefit from acute OT with follow up [place].    Follow Up Recommendations  Home health OT;Supervision/Assistance - 24 hour    Equipment Recommendations  None recommended by OT       Precautions / Restrictions Precautions Precautions: Fall Precaution Comments: monitor O2 (dropped on RA to 86% with ambulation) Restrictions Weight Bearing Restrictions: No      Mobility Bed Mobility Overal bed mobility: Needs Assistance Bed Mobility: Supine to Sit     Supine to sit: Supervision           Transfers Overall transfer level: Needs assistance Equipment used: 1 person hand held assist Transfers: Sit to/from Stand Sit to Stand: Min assist              Balance Overall balance assessment: Mild deficits observed, not formally tested                                         ADL either performed or assessed with clinical judgement   ADL                                         General ADL Comments: RN came in and gave pt a stylus pen which didn't really help alot (it is a different concept for her); however using the feature of word prediction works for her, but still more slow than normal per her report.     Vision Baseline Vision/History: Wears glasses Wears Glasses: At all times Patient Visual Report: Blurring of vision Vision Assessment?: Yes Visual Fields: Other (comment) (right lower quadrantanopia) Additional Comments: Discussed that she would need okay from doctor to drive, needs to watch when cutting food in kitchen and when she is working to the right of the stove with stove eyes on. Recommended she see her eye doctor in 3 months for a full visual field test.            Pertinent Vitals/Pain Pain Assessment: No/denies pain     Hand  Dominance Right   Extremity/Trunk Assessment Upper Extremity Assessment Upper Extremity Assessment: Overall WFL for tasks assessed (has a little trouble with hitting "keys" on phone with right index finger (feel this more of a visual perceptual issue than a coordination issue).)           Communication Communication Communication: No difficulties   Cognition Arousal/Alertness: Awake/alert Behavior During Therapy: WFL for tasks assessed/performed Overall Cognitive Status: Within Functional Limits for tasks assessed                                                Home Living Family/patient expects to be discharged to:: Private residence Living Arrangements:  Spouse/significant other Available Help at Discharge: Family;Available 24 hours/day Type of Home: House Home Access: Stairs to enter [employer] of Steps: 1   Home Layout: One level     Bathroom Shower/Tub: Walk-in shower;Curtain   Bathroom Toilet: Handicapped height     Home Equipment: Environmental consultant - 2 wheels;Shower seat;Grab bars - tub/shower   Additional Comments: sink beside toilet      Prior Functioning/Environment Level of Independence: Independent        Comments: Both she and husband drive; reports husband in in starting stages of dementia        OT Problem List: Impaired balance (sitting and/or standing);Impaired vision/perception      OT Treatment/Interventions: Self-care/ADL training;DME and/or AE instruction;Patient/family education;Balance training;Visual/perceptual remediation/compensation    OT Goals(Current goals can be found in the care plan section) Acute Rehab OT Goals Patient Stated Goal: to go home OT Goal Formulation: With patient Time For Goal Achievement: 01/22/21 Potential to Achieve Goals: Good  OT Frequency: Min 2X/week           Co-evaluation PT/OT/SLP Co-Evaluation/Treatment: Yes Reason for Co-Treatment: For patient/therapist safety PT goals addressed during session: Mobility/safety with mobility OT goals addressed during session: ADL's and self-care;Strengthening/ROM      AM-PAC OT "6 Clicks" Daily Activity     Outcome Measure Help from another person eating meals?: None Help from another person taking care of personal grooming?: A Little Help from another person toileting, which includes using toliet, bedpan, or urinal?: A Little Help from another person bathing (including washing, rinsing, drying)?: A Little Help from another person to put on and taking off regular upper body clothing?: A Little Help from another person to put on and taking off regular lower body clothing?: A Little 6 Click Score: 19   End of Session  Equipment Utilized During Treatment: Gait belt Nurse Communication:  (RN assisted with ambulation by pushing IV pole; she also ok'd to turn O2 down to 2 liters)  Activity Tolerance: Patient tolerated treatment well Patient left: in chair;with call bell/phone within reach;with chair alarm set  OT Visit Diagnosis: Unsteadiness on feet (R26.81);Other abnormalities of gait and mobility (R26.89);Other (comment) (low vision 1 eye)                Time: 3086-5784 OT Time Calculation (min): 49 min Charges:  OT General Charges $OT Visit: 1 Visit OT Evaluation $OT Eval Moderate Complexity: 1 Mod OT Treatments $Self Care/Home Management : 8-22 mins  Golden Circle, OTR/L Acute [employer] Pager [phone] Office [phone]     Almon Register 01/08/2021, 9:49 AM

## 2021-01-08 NOTE — Evaluation (Addendum)
Physical Therapy Evaluation Patient Details Name: [name] MRN: [id number] DOB: Dec 17, 1945 Today's Date: 01/08/2021   History of Present Illness  [name] is a 75 y.o. female with a medical history significant for dysphagia, hyperthyroidism, polyneuropathy, and migraines with recent admission to [facility] for evaluation of generalized weakness and dizziness and found to have a leukocytosis with a WBC of 24.7K and a hemoglobin of 5.7. 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 She received a blood transfusion on 4/16 but unfortunately had a transfusion reaction. On 4/18, she was being taken for a bone marrow biopsy to evaluate for a marrow malignancy when she became acutely aphasic and dysarthric and the procedure was cancelled for brain imaging. MRI brain revealed multiple acute infarcts--Acute embolic infarcts of the right parietotemporal lobes greater than left frontal lobe. Additional punctate left parietal and right occipital acute infarcts.  Clinical Impression  The patient  Ambulated x 200' with 1 hand hold assist, on RA. Lowest SPO2 91%. HR 108. Patient with 3/4 dyspnea.  patient will mild balance losses, patient does endorse neuropathy. Patient should progress to DC home with spouse.  Pt admitted with above diagnosis.  Pt currently with functional limitations due to the deficits listed below (see PT Problem List). Pt will benefit from skilled PT to increase their independence and safety with mobility to allow discharge to the venue listed below.       Follow Up Recommendations Home health PT    Equipment Recommendations  None recommended by PT    Recommendations for Other Services       Precautions / Restrictions Precautions Precautions: Fall Precaution Comments: monitor O2 (dropped on RA to 86% with ambulation)      Mobility  Bed Mobility Overal bed mobility: Needs Assistance Bed Mobility: Supine to Sit     Supine to sit:  Supervision     General bed mobility comments: extra time, no support required    Transfers Overall transfer level: Needs assistance Equipment used: 1 person hand held assist Transfers: Sit to/from Stand Sit to Stand: Min assist         General transfer comment: steady assist to stnad up  Ambulation/Gait Ambulation/Gait assistance: Min assist;+2 safety/equipment Gait Distance (Feet): 200 Feet Assistive device: 1 person hand held assist Gait Pattern/deviations: Drifts right/left;Decreased stride length Gait velocity: decr   General Gait Details: intermittent balance loss, expecially when turns head.  mildly SOB.  Stairs            Wheelchair Mobility    Modified Rankin (Stroke Patients Only)       Balance Overall balance assessment: Needs assistance Sitting-balance support: Feet supported;No upper extremity supported Sitting balance-Leahy Scale: Good     Standing balance support: During functional activity;No upper extremity supported Standing balance-Leahy Scale: Fair                               Pertinent Vitals/Pain Pain Assessment: No/denies pain    Home Living Family/patient expects to be discharged to:: Private residence Living Arrangements: Spouse/significant other Available Help at Discharge: Family;Available 24 hours/day Type of Home: House Home Access: Stairs to enter   [employer] of Steps: 1 Home Layout: One level Home Equipment: Walker - 2 wheels;Shower seat;Grab bars - tub/shower Additional Comments: sink beside toilet    Prior Function           Comments: both she and husband drive; reports  husband in in starting stages of dementia     Hand Dominance        Extremity/Trunk Assessment   Upper Extremity Assessment Upper Extremity Assessment: Overall WFL for tasks assessed    Lower Extremity Assessment Lower Extremity Assessment: Generalized weakness    Cervical / Trunk Assessment Cervical /  Trunk Assessment: Normal  Communication   Communication: No difficulties  Cognition Arousal/Alertness: Awake/alert Behavior During Therapy: WFL for tasks assessed/performed Overall Cognitive Status: Within Functional Limits for tasks assessed                                        General Comments      Exercises     Assessment/Plan    PT Assessment Patient needs continued PT services  PT Problem List Decreased mobility;Decreased activity tolerance;Cardiopulmonary status limiting activity;Decreased balance       PT Treatment Interventions DME instruction;Therapeutic activities;Gait training;Therapeutic exercise;Patient/family education;Functional mobility training;Balance training    PT Goals (Current goals can be found in the Care Plan section)  Acute Rehab PT Goals Patient Stated Goal: to go home PT Goal Formulation: With patient Time For Goal Achievement: 01/22/21 Potential to Achieve Goals: Good    Frequency Min 3X/week   Barriers to discharge        Co-evaluation PT/OT/SLP Co-Evaluation/Treatment: Yes Reason for Co-Treatment: For patient/therapist safety PT goals addressed during session: Mobility/safety with mobility OT goals addressed during session: ADL's and self-care       AM-PAC PT "6 Clicks" Mobility  Outcome Measure Help needed turning from your back to your side while in a flat bed without using bedrails?: None Help needed moving from lying on your back to sitting on the side of a flat bed without using bedrails?: None Help needed moving to and from a bed to a chair (including a wheelchair)?: A Little Help needed standing up from a chair using your arms (e.g., wheelchair or bedside chair)?: A Little Help needed to walk in hospital room?: A Little Help needed climbing 3-5 steps with a railing? : A Lot 6 Click Score: 19    End of Session Equipment Utilized During Treatment: Gait belt Activity Tolerance: Patient tolerated treatment  well Patient left: in chair;with call bell/phone within reach;with chair alarm set Nurse Communication: Mobility status PT Visit Diagnosis: Unsteadiness on feet (R26.81);Difficulty in walking, not elsewhere classified (R26.2)    Time: 1448-1856 PT Time Calculation (min) (ACUTE ONLY): 32 min   Charges:   PT Evaluation $PT Eval Low Complexity: [place] PT Acute Rehabilitation Services Pager [phone] Office [phone]   Claretha Cooper 01/08/2021, 12:47 PM

## 2021-01-08 NOTE — Progress Notes (Signed)
[name] is now on heparin.  She is doing well on the heparin.  I appreciate pharmacy helping out with managing the levels.  Her hemoglobin is down a little bit.  Hemoglobin is 6.8.  I would hold off on transfusing her since she does have a tough crossmatch because of the warm autoantibody.  Hopefully, the IVIG that she received and the prednisone that she is currently receiving will begin to help slow down her immune hemolysis.  She seems pretty well neurologically.  Hopefully, she will be able to eat a little bit more.  She has had no bleeding.  She has had no fever.  Her labs are still pending for right now.  We just have the CBC back.  Her platelet count is 158,000.  We still have to think about a bone marrow test on her.  I probably would consider this next week.  Hopefully, by then, she will have stabilized a little bit.  Her vital signs are stable.  Temperature 98.3.  Pulse 48.  Blood pressure 112/56.  Her lungs are clear bilaterally.  She has good air movement bilaterally.  Cardiac exam regular rate and rhythm.  She has no murmurs.  Abdomen is soft.  Bowel sounds are present.  There is no guarding or rebound tenderness.  Extremities shows good strength in upper and lower extremities.  She has good range of motion of her extremities.  Neurological exam shows no obvious neurological deficits.  She does have the reason DVT.  She may have a pulmonary embolism with this although she cannot have a CT angiogram because of dye allergy.  I would keep her on the heparin for right now.  Again, I would hold off on transfusing her.  It is tough to crossmatch her because of the warm autoantibody.  If we can try to decrease the level of the autoantibody, it might be easier to crossmatch her with more compatible units.  Obviously, she is getting outstanding care from the wonderful staff in the ICU.  They really do a fantastic job with all of our patients.  Psalm 92: 1-2

## 2021-01-09 DIAGNOSIS — D72829 Elevated white blood cell count, unspecified: Secondary | ICD-10-CM | POA: Diagnosis not present

## 2021-01-09 DIAGNOSIS — E039 Hypothyroidism, unspecified: Secondary | ICD-10-CM | POA: Diagnosis not present

## 2021-01-09 DIAGNOSIS — I82402 Acute embolism and thrombosis of unspecified deep veins of left lower extremity: Secondary | ICD-10-CM | POA: Diagnosis not present

## 2021-01-09 DIAGNOSIS — D591 Autoimmune hemolytic anemia, unspecified: Secondary | ICD-10-CM | POA: Diagnosis not present

## 2021-01-09 DIAGNOSIS — D589 Hereditary hemolytic anemia, unspecified: Secondary | ICD-10-CM | POA: Diagnosis not present

## 2021-01-09 LAB — COMPREHENSIVE METABOLIC PANEL
ALT: 29 U/L (ref 0–44)
AST: 39 U/L (ref 15–41)
Albumin: 2.9 g/dL — ABNORMAL LOW (ref 3.5–5.0)
Alkaline Phosphatase: 42 U/L (ref 38–126)
Anion gap: 9 (ref 5–15)
BUN: 18 mg/dL (ref 8–23)
CO2: 23 mmol/L (ref 22–32)
Calcium: 8.8 mg/dL — ABNORMAL LOW (ref 8.9–10.3)
Chloride: 106 mmol/L (ref 98–111)
Creatinine, Ser: 0.7 mg/dL (ref 0.44–1.00)
GFR, Estimated: >60 mL/min (ref >60)
Glucose, Bld: 98 mg/dL (ref 70–99)
Potassium: 4.2 mmol/L (ref 3.5–5.1)
Sodium: 138 mmol/L (ref 135–145)
Total Bilirubin: 1.9 mg/dL — ABNORMAL HIGH (ref 0.3–1.2)
Total Protein: 7.2 g/dL (ref 6.5–8.1)

## 2021-01-09 LAB — BPAM RBC
Blood Product Expiration Date: 202205172359
Blood Product Expiration Date: 202205282359
ISSUE DATE / TIME: 202204190942
Unit Type and Rh: 600
Unit Type and Rh: 600

## 2021-01-09 LAB — TYPE AND SCREEN
ABO/RH(D): A NEG
Antibody Screen: POSITIVE
DAT, IgG: POSITIVE
Unit division: 0
Unit division: 0

## 2021-01-09 LAB — RETICULOCYTES
Immature Retic Fract: 26.2 % — ABNORMAL HIGH (ref 2.3–15.9)
RBC.: 2.1 MIL/uL — ABNORMAL LOW (ref 3.87–5.11)
Retic Count, Absolute: 581.5 10*3/uL — ABNORMAL HIGH (ref 19.0–186.0)
Retic Ct Pct: 27.7 % — ABNORMAL HIGH (ref 0.4–3.1)

## 2021-01-09 LAB — CBC WITH DIFFERENTIAL/PLATELET
Abs Immature Granulocytes: 0.16 10*3/uL — ABNORMAL HIGH (ref 0.00–0.07)
Basophils Absolute: 0 10*3/uL (ref 0.0–0.1)
Basophils Relative: 0 %
Eosinophils Absolute: 0.1 10*3/uL (ref 0.0–0.5)
Eosinophils Relative: 1 %
HCT: 23.4 % — ABNORMAL LOW (ref 36.0–46.0)
Hemoglobin: 7.7 g/dL — ABNORMAL LOW (ref 12.0–15.0)
Immature Granulocytes: 1 %
Lymphocytes Relative: 16 %
Lymphs Abs: 2 10*3/uL (ref 0.7–4.0)
MCH: 39.3 pg — ABNORMAL HIGH (ref 26.0–34.0)
MCHC: 32.9 g/dL (ref 30.0–36.0)
MCV: 119.4 fL — ABNORMAL HIGH (ref 80.0–100.0)
Monocytes Absolute: 1.2 10*3/uL — ABNORMAL HIGH (ref 0.1–1.0)
Monocytes Relative: 9 %
Neutro Abs: 9.1 10*3/uL — ABNORMAL HIGH (ref 1.7–7.7)
Neutrophils Relative %: 73 %
Platelets: 172 10*3/uL (ref 150–400)
RBC: 1.96 MIL/uL — ABNORMAL LOW (ref 3.87–5.11)
WBC: 12.5 10*3/uL — ABNORMAL HIGH (ref 4.0–10.5)
nRBC: 8.2 % — ABNORMAL HIGH (ref 0.0–0.2)

## 2021-01-09 LAB — HEPARIN LEVEL (UNFRACTIONATED)
Heparin Unfractionated: 0.94 IU/mL — ABNORMAL HIGH (ref 0.30–0.70)
Heparin Unfractionated: >1.1 IU/mL — ABNORMAL HIGH (ref 0.30–0.70)

## 2021-01-09 LAB — CULTURE, BLOOD (ROUTINE X 2)

## 2021-01-09 LAB — LACTATE DEHYDROGENASE: LDH: 468 U/L — ABNORMAL HIGH (ref 98–192)

## 2021-01-09 MED ORDER — HEPARIN (PORCINE) 25000 UT/250ML-% IV SOLN
450.0000 [IU]/h | INTRAVENOUS | Status: DC
  Administered 2021-01-09: 600 [IU]/h via INTRAVENOUS
  Administered 2021-01-11: 500 [IU]/h via INTRAVENOUS
  Filled 2021-01-09 (×3): qty 250

## 2021-01-09 NOTE — Progress Notes (Signed)
ANTICOAGULATION CONSULT NOTE - Follow Up Consult  Pharmacy Consult for Heparin Indication: DVT/PE, concurrent CVA  Patient Measurements: Height: 5\' 5"  (165.1 cm) Weight: 72.6 kg (160 lb) IBW/kg (Calculated) : 57 Heparin Dosing Weight: 71.6 kg   Medications:  Infusions:  . heparin 1,000 Units/hr (01/09/21 0400)    Assessment: Summary:  74 yoF on heparin for new PE/DVT and concurrent CVA and severe autoimmune hemolytic anemia.  01/09/2021 HL 0.94 supra-therapeutic on 1000 units/hr Per RN no bleeding   Goal of Therapy:  Heparin level 0.3-0.5 units/ml Monitor platelets by anticoagulation protocol: Yes   Plan:  Decrease heparin to 800 units/hr Heparin level in 8 hours Daily heparin level and CBC Continue to monitor closely for s/s bleeding   01/11/2021 RPh 01/09/2021, 4:57 AM

## 2021-01-09 NOTE — Progress Notes (Signed)
ANTICOAGULATION CONSULT NOTE - Follow Up Consult  Pharmacy Consult for Heparin Indication: DVT/PE, concurrent CVA  Patient Measurements: Height: 5' 5"  (165.1 cm) Weight: 72.6 kg (160 lb) IBW/kg (Calculated) : 57 Heparin Dosing Weight: 71.6 kg   Medications:  Infusions:  . heparin 800 Units/hr (01/09/21 1200)    Assessment: 29 yoF with severe autoimmune hemolytic anemiaofunknown etiology.  Hgb 5.3 on admit, down as low ast 4.3 - transfused 4/16 and again 4/19.  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  Pharmacy is consulted to dose IV heparin.  01/09/2021  Heparin level remains supratherapeutic (>1.1) on 800 units/hr  Confirmed that heparin is infusing via peripheral IV in left arm, lab was drawn from right/opposite arm  RN reports new mild nosebleed, MD notified. Of note, patient fell overnight onto her elbows, did not hit head.  CBC: Hgb down 7.7, Plts wnl - no transfusion today  Plan noted for bone marrow biopsy Mon 4/25  Goal of Therapy:  Heparin level 0.3-0.5 units/ml Monitor platelets by anticoagulation protocol: Yes   Plan:  Hold heparin x 2hr Resume at a lower rate 600 units/hr Heparin level in 8 hours Daily heparin level and CBC Continue to monitor closely for s/s bleeding  Peggyann Juba, PharmD, BCPS Pharmacy: [phone] 01/09/2021, 1:15 PM

## 2021-01-09 NOTE — Progress Notes (Signed)
Chaplain engaged in a follow-up visit with [facility] and her husband, [name].  Their son was also present today as well.  [name] shared that she is anxious in waiting to find out what is happening to her body.  She also is feeling anxiety about possibly moving floors or going to [facility] for a procedure.  The moving pieces and finding out all the logistics of what is happening seem to be having an impact on her, but she is also receiving a lot of comfort from family.  [name] continues to be by her side and she has two sons who are present during this time as well, with one son traveling from [place] to be with her too.  Chaplain affirmed the love and support she is surrounded by.  Chaplain also offered a blessing around resting in finding answers.    Chaplain offered listening, support and presence.  Chaplain is available to follow-up.    01/09/21 1200  Clinical Encounter Type  Visited With Patient and family together  Visit Type Follow-up

## 2021-01-09 NOTE — Progress Notes (Signed)
This nurse ambulated patient to bathroom. On the way back to bed, patient attempted to lift her leg and lost balance. Patient hit the back of her right arm as this nurse ease her to the floor. This nurse called for staff help. Staff was able to get her back to bed. Linton Flemings NP was notified and patient was assessed.

## 2021-01-09 NOTE — Progress Notes (Signed)
Report called to [facility] 4E RN. All questions answered at this time. Pt was transported in the wheelchair on RA by RN. All patient belongings and patient chart transported with pt. 4E will continue to care for pt.

## 2021-01-09 NOTE — Progress Notes (Signed)
[name] is doing okay this morning.  She apparently had a little bit of a fall last evening.  The nurse was with her.  There is no bleeding.  I think she had gotten a unit of blood yesterday.  Her hemoglobin was 8.1.  This morning, her hemoglobin is 7.7.  Her reticulocyte count might be slowly trending downward.  It was 27% this morning.  The LDH is on the high side at 468.  Her bilirubin is 1.9.  She continues on heparin for the thromboembolic disease.  Her hypercoagulable studies all appear to be normal so far.  The marginally decreased Protein S is uncertain in significance.  She seems to be doing well neurologically.  She is talking well.  She is moving all extremities.  White cell count is 12.5.  Platelet count 172,000.  She is still on prednisone.  She received IVIG several days ago.  I would probably see how the blood counts go over the weekend.  If we find that there is still are's trending downward, I would consider her for Rituxan.  Her vital signs show temperature 98.  Pulse 58.  Blood pressure 121/52.  Her lungs are clear bilaterally.  She has good air movement bilaterally.  Cardiac exam regular rate and rhythm with no murmurs.  Abdomen is soft.  She has good bowel sounds.  There is no fluid wave.  There is no palpable liver or spleen tip.  Extremities she has good movement bilaterally.  She has maybe a little bit of swelling in the left leg.  She has no obvious venous cord.  Neurological exam shows no focal deficits.  We will see about stopping her oxygen.  The nasal cannula bothers her quite a bit.  Her oxygen saturation has been pretty good.  Hopefully we can get her off the supplemental oxygen.  I would keep her on the heparin through the weekend.  We still have to do the bone marrow biopsy on her.  I would see about doing this on Monday.  I think she will be stable enough to have it done on Monday.   I know that she is getting wonderful care by all the staff in the  ICU.  Lattie Haw, MD  Psalm 46:1

## 2021-01-09 NOTE — Progress Notes (Signed)
PROGRESS NOTE    [name]  BWG:[id number] DOB: 1946-07-27 DOA: 01/02/2021 PCP: Lorene Dy, MD   Brief Narrative:  This 75 years old female with PMH significant for hypothyroidism and polyneuropathy who presented in the ED with generalized weakness.  Upon ED evaluation she was found to be anemic with a hemoglobin of 5.7 down from 13.0 which is her baseline. She was also found to have elevated bilirubin mostly indirect, elevated LDH and elevated D-dimer.  CT abdomen and pelvis was normal. Stool for occult blood negative.  chest x-ray was normal. 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  Hematology was consulted,  Patient started on prednisone, famciclovir and Diflucan.  She is also found to have  stroke, neurology was consulted.  Patient is started on heparin gtt. for confirmed DVT and suspected PE on echocardiogram.  Plan is for bone marrow biopsy on Monday.  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 Stool for occult blood negative, found to have elevated LDH, elevated bilirubin. 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 Continue prednisone, folic acid, IVIG given on 01/05/2021  Blood transfusion completed on 4/19 without overt incident - 1u PRBC since admission Patient remains high risk for transfusion reaction, will attempt to pretreat with antihistamine per protocol  Mild transfusion reaction on 4/16 -blood transfusion stopped immediately without any prolonged issues or incidents She was scheduled for bone marrow biopsy on the 18th,  this was canceled in the setting of acute stroke,   Reticulocyte count improving appropriately with treatment 14.7>>17.1>>21.8>>28.6 Plan: Bone marrow  biopsy on Monday.  Hemoglobin 7.1> 6.8 > 8.1>7.7 No further transfusion as per hematology since she is maintaining her hemoglobin.  Acute ischemic vs embolic stroke in the setting of above: On 01/05/2021 while in route to CT scanner for bone marrow biopsy,  patient had mental status changes with slurred speech and confusion. Code stroke was called, discussed with teleneurology, MRI shows - "Acute infarcts of the right parietotemporal lobes greater than left frontal lobe. Additional punctate left parietal and right occipital acute infarcts" Neurology consulted given atypical appearance, concern for embolic disease, suggest transfer to [facility] main hospital for further evaluation with neurology, May require TEE pending cultures versus other findings.   Previous plan was to transfer to ICU at main campus on hold given ongoing hematological issues as above. Echo: LVEF 60-65%, no regional wall motion abnormalities. McConnell's sign appears to be present.. Right ventricular systolic function is mildly reduced. The right ventricular size is moderately enlarged. There is moderately elevated pulmonary artery systolic pressure.  Continue heparin gtt. further anticoagulation regimen per heme-onc.  DVT and suspected PE DVT on BLE US Echo consistent with PE Hematology recommended anticoagulation.  He recommended discontinuation of aspirin if anticoagulation is started. Continue heparin gtt. with the plan to transition to oral DOAC. Hypercoagulable work-up negative so far.  Questionable bacteremia vs false positive culture Patient does not meet sepsis criteria. Staph epi 1/2 preliminary - hold abx and follow repeat cultures Echo no vegetation,  may require TEE to rule out vegetations Will consult ID if repeat cultures positive given unclear source although bacteremia/vegetations may explain embolic disease. Repeat blood cultures no growth so far.  Hypothyroidism Continue Synthroid at current  dose.  Anxiety Continue Zoloft  at home dose. Ativan as needed for anxiety.  Polyneuropathy Continue gabapentin    DVT prophylaxis: Heparin gtt Code Status:  Full code. Family Communication:  No family at bed side. Disposition Plan:   Status is: Inpatient  Remains inpatient appropriate because:Inpatient level of care appropriate due to severity of illness   Dispo: The patient is from: Home              Anticipated d/c is to: Home              Patient currently is not medically stable to d/c.   Difficult to place patient No   Consultants:   Hematology  Neurology  Procedures: TTE on 4/19. TEE pending Bone marrow biopsy pending  Antimicrobials:   Anti-infectives (From admission, onward)   Start     Dose/Rate Route Frequency Ordered Stop   01/02/21 1800  fluconazole (DIFLUCAN) tablet 100 mg        100 mg Oral Daily 01/02/21 1713     01/02/21 1800  famciclovir (FAMVIR) tablet 500 mg        500 mg Oral Daily 01/02/21 1713        Subjective: Patient was seen and examined at bedside.  Overnight events noted.  Patient reports feeling much improved. She is sitting comfortably on the bed, RN reports she fell last night but denies any head injury, she has miner scratches on both elbows.  She is weaned down to 2 L/min sats 97%.   Objective: Vitals:   01/09/21 0800 01/09/21 0850 01/09/21 0900 01/09/21 1000  BP: (!) 147/73  (!) 148/75 (!) 148/66  Pulse: 65  68 62  Resp: 16  (!) 21 (!) 21  Temp: 98.3 F (36.8 C)     TempSrc: Oral     SpO2: 97% 100% 97% 98%  Weight:      Height:        Intake/Output Summary (Last 24 hours) at 01/09/2021 1157 Last data filed at 01/09/2021 0600 Gross per 24 hour  Intake 164.14 ml  Output --  Net 164.14 ml   Filed Weights   01/02/21 1035  Weight: 72.6 kg    Examination:  General exam: Appears calm and comfortable, not in any acute distress. Respiratory system: Clear to auscultation. Respiratory effort normal. Cardiovascular  system: S1 & S2 heard, RRR. No JVD, murmurs, rubs, gallops or clicks. No pedal edema. Gastrointestinal system: Abdomen is nondistended, soft and nontender. No organomegaly or masses felt. Normal bowel sounds heard. Central nervous system: Alert and oriented. No focal neurological deficits. Extremities: Symmetric 5 x 5 power.  No edema, no cyanosis, no clubbing. Skin: No rashes, lesions or ulcers Psychiatry: Judgement and insight appear normal. Mood & affect appropriate.     Data Reviewed: I have personally reviewed following labs and imaging studies  CBC: Recent Labs  Lab 01/05/21 0307 01/06/21 0245 01/06/21 1419 01/07/21 0237 01/07/21 2028 01/08/21 0246 01/08/21 0908 01/09/21 0425  WBC 19.5* 16.3*  --  14.2*  --  12.8*  --  12.5*  NEUTROABS 15.5*  --   --   --   --   --   --  9.1*  HGB 4.3* 4.5*   < > 7.1* 7.4* 6.8* 8.1* 7.7*  HCT 13.5* 13.5*   < > 18.9* 23.1* 21.7* 23.3* 23.4*  MCV 129.8* 145.2*  --  130.3*  --  116.0*  --  119.4*  PLT 266 234  --  214  --  158  --  172   < > =  values in this interval not displayed.   Basic Metabolic Panel: Recent Labs  Lab 01/04/21 0244 01/05/21 0307 01/06/21 0245 01/07/21 0237 01/09/21 0425  NA 138 137 137 137 138  K 3.8 4.1 3.9 4.2 4.2  CL 109 108 109 108 106  CO2 21* 22 22 21* 23  GLUCOSE 132* 125* 131* 138* 98  BUN _0 CREATININE 0.59 0.66 0.65 0.64 0.70  CALCIUM 8.6* 8.4* 8.4* 8.6* 8.8*   GFR: Estimated Creatinine Clearance: 63.4 mL/min (by C-G formula based on SCr of 0.7 mg/dL). Liver Function Tests: Recent Labs  Lab 01/04/21 0244 01/05/21 0307 01/06/21 0245 01/07/21 0237 01/09/21 0425  AST _1 41 39  ALT _2 34 29  ALKPHOS 45 39 44 46 42  BILITOT 2.3* 1.1 0.9 1.3* 1.9*  PROT 5.9* 7.2 8.4* 8.5* 7.2  ALBUMIN 3.3* 3.1* 3.1* 3.3* 2.9*   No results for input(s): LIPASE, AMYLASE in the last 168 hours. No results for input(s): AMMONIA in the last 168 hours. Coagulation Profile: Recent Labs   Lab 01/02/21 1400  INR 1.1   Cardiac Enzymes: No results for input(s): CKTOTAL, CKMB, CKMBINDEX, TROPONINI in the last 168 hours. BNP (last 3 results) No results for input(s): PROBNP in the last 8760 hours. HbA1C: No results for input(s): HGBA1C in the last 72 hours. CBG: Recent Labs  Lab 01/05/21 1021  GLUCAP 111*   Lipid Profile: No results for input(s): CHOL, HDL, LDLCALC, TRIG, CHOLHDL, LDLDIRECT in the last 72 hours. Thyroid Function Tests: No results for input(s): TSH, T4TOTAL, FREET4, T3FREE, THYROIDAB in the last 72 hours. Anemia Panel: Recent Labs    01/08/21 0246 01/09/21 0425  RETICCTPCT 28.1* 27.7*   Sepsis Labs: Recent Labs  Lab 01/02/21 1612 01/02/21 1851  LATICACIDVEN 2.5* 3.2*    Recent Results (from the past 240 hour(s))  Resp Panel by RT-PCR (Flu A&B, Covid) Nasopharyngeal Swab     Status: None   Collection Time: 01/02/21 12:15 PM   Specimen: Nasopharyngeal Swab; Nasopharyngeal(NP) swabs in vial transport medium  Result Value Ref Range Status   SARS Coronavirus 2 by RT PCR NEGATIVE NEGATIVE Final    Comment: (NOTE) SARS-CoV-2 target nucleic acids are NOT DETECTED.  The SARS-CoV-2 RNA is generally detectable in upper respiratory specimens during the acute phase of infection. The lowest concentration of SARS-CoV-2 viral copies this assay can detect is 138 copies/mL. A negative result does not preclude SARS-Cov-2 infection and should not be used as the sole basis for treatment or other patient management decisions. A negative result may occur with  improper specimen collection/handling, submission of specimen other than nasopharyngeal swab, presence of viral mutation(s) within the areas targeted by this assay, and inadequate number of viral copies(<138 copies/mL). A negative result must be combined with clinical observations, patient history, and epidemiological information. The expected result is Negative.  Fact Sheet for Patients:   EntrepreneurPulse.com.au  Fact Sheet for Healthcare Providers:  IncredibleEmployment.be  This test is no t yet approved or cleared by the [place] FDA and  has been authorized for detection and/or diagnosis of SARS-CoV-2 by FDA under an Emergency Use Authorization (EUA). This EUA will remain  in effect (meaning this test can be used) for the duration of the COVID-19 declaration under Section 564(b)(1) of the Act, 21 U.S.C.section 360bbb-3(b)(1), unless the authorization is terminated  or revoked sooner.       Influenza A by PCR NEGATIVE NEGATIVE Final   Influenza B by PCR  NEGATIVE NEGATIVE Final    Comment: (NOTE) The Xpert Xpress SARS-CoV-2/FLU/RSV plus assay is intended as an aid in the diagnosis of influenza from Nasopharyngeal swab specimens and should not be used as a sole basis for treatment. Nasal washings and aspirates are unacceptable for Xpert Xpress SARS-CoV-2/FLU/RSV testing.  Fact Sheet for Patients: EntrepreneurPulse.com.au  Fact Sheet for Healthcare Providers: IncredibleEmployment.be  This test is not yet approved or cleared by the [place] FDA and has been authorized for detection and/or diagnosis of SARS-CoV-2 by FDA under an Emergency Use Authorization (EUA). This EUA will remain in effect (meaning this test can be used) for the duration of the COVID-19 declaration under Section 564(b)(1) of the Act, 21 U.S.C. section 360bbb-3(b)(1), unless the authorization is terminated or revoked.  Performed at [facility], [place] [address], [place], [place] [address]   MRSA PCR Screening     Status: None   Collection Time: 01/03/21  6:24 PM   Specimen: Nasal Mucosa; Nasopharyngeal  Result Value Ref Range Status   MRSA by PCR NEGATIVE NEGATIVE Final    Comment:        The GeneXpert MRSA Assay (FDA approved for NASAL specimens only), is one component of  a comprehensive MRSA colonization surveillance program. It is not intended to diagnose MRSA infection nor to guide or monitor treatment for MRSA infections. Performed at [facility], [place] [address], [place], [place] [address]   Culture, blood (routine x 2)     Status: None (Preliminary result)   Collection Time: 01/05/21  3:18 PM   Specimen: BLOOD LEFT FOREARM  Result Value Ref Range Status   Specimen Description   Final    BLOOD LEFT FOREARM Performed at [place] [address], [place], [place] [address]    Special Requests   Final    BOTTLES DRAWN AEROBIC ONLY Blood Culture adequate volume Performed at [facility] [address], [place], [place] [address]    Culture   Final    NO GROWTH 4 DAYS Performed at [facility], [place] [address] [place], [place] [address]    Report Status PENDING  Incomplete  Culture, blood (routine x 2)     Status: Abnormal   Collection Time: 01/05/21  3:18 PM   Specimen: BLOOD  Result Value Ref Range Status   Specimen Description   Final    BLOOD LEFT ANTECUBITAL Performed at [place] [facility] [address], [place], [place] [address]    Special Requests   Final    BOTTLES DRAWN AEROBIC ONLY Blood Culture adequate volume Performed at [facility] [address], [place], [place] [address]    Culture  Setup Time   Final    GRAM POSITIVE COCCI AEROBIC BOTTLE ONLY CRITICAL RESULT CALLED TO, READ BACK BY AND VERIFIED WITH: D,WOFFORD PHARMD _0  01/06/21 EB    Culture (A)  Final    STAPHYLOCOCCUS EPIDERMIDIS THE SIGNIFICANCE OF ISOLATING THIS ORGANISM FROM A SINGLE SET OF BLOOD CULTURES WHEN MULTIPLE SETS ARE DRAWN IS UNCERTAIN. PLEASE NOTIFY THE MICROBIOLOGY DEPARTMENT WITHIN ONE WEEK IF SPECIATION AND SENSITIVITIES ARE REQUIRED. Performed at [facility], [place] [address], [place],  [address]    Report Status 01/07/2021  FINAL  Final  Blood Culture ID Panel (Reflexed)     Status: Abnormal   Collection Time: 01/05/21  3:18 PM  Result Value Ref Range Status   Enterococcus faecalis NOT DETECTED NOT DETECTED Final  Enterococcus Faecium NOT DETECTED NOT DETECTED Final   Listeria monocytogenes NOT DETECTED NOT DETECTED Final   Staphylococcus species DETECTED (A) NOT DETECTED Final    Comment: CRITICAL RESULT CALLED TO, READ BACK BY AND VERIFIED WITH: D,WOFFORD PHARMD _0  01/06/21 EB    Staphylococcus aureus (BCID) NOT DETECTED NOT DETECTED Final   Staphylococcus epidermidis DETECTED (A) NOT DETECTED Final    Comment: Methicillin (oxacillin) resistant coagulase negative staphylococcus. Possible blood culture contaminant (unless isolated from more than one blood culture draw or clinical case suggests pathogenicity). No antibiotic treatment is indicated for blood  culture contaminants. CRITICAL RESULT CALLED TO, READ BACK BY AND VERIFIED WITH: D,WOFFORD PHARMD _1  01/06/21 EB    Staphylococcus lugdunensis NOT DETECTED NOT DETECTED Final   Streptococcus species NOT DETECTED NOT DETECTED Final   Streptococcus agalactiae NOT DETECTED NOT DETECTED Final   Streptococcus pneumoniae NOT DETECTED NOT DETECTED Final   Streptococcus pyogenes NOT DETECTED NOT DETECTED Final   A.calcoaceticus-baumannii NOT DETECTED NOT DETECTED Final   Bacteroides fragilis NOT DETECTED NOT DETECTED Final   Enterobacterales NOT DETECTED NOT DETECTED Final   Enterobacter cloacae complex NOT DETECTED NOT DETECTED Final   Escherichia coli NOT DETECTED NOT DETECTED Final   Klebsiella aerogenes NOT DETECTED NOT DETECTED Final   Klebsiella oxytoca NOT DETECTED NOT DETECTED Final   Klebsiella pneumoniae NOT DETECTED NOT DETECTED Final   Proteus species NOT DETECTED NOT DETECTED Final   Salmonella species NOT DETECTED NOT DETECTED Final   Serratia marcescens NOT DETECTED NOT DETECTED Final   Haemophilus influenzae NOT DETECTED NOT DETECTED  Final   Neisseria meningitidis NOT DETECTED NOT DETECTED Final   Pseudomonas aeruginosa NOT DETECTED NOT DETECTED Final   Stenotrophomonas maltophilia NOT DETECTED NOT DETECTED Final   Candida albicans NOT DETECTED NOT DETECTED Final   Candida auris NOT DETECTED NOT DETECTED Final   Candida glabrata NOT DETECTED NOT DETECTED Final   Candida krusei NOT DETECTED NOT DETECTED Final   Candida parapsilosis NOT DETECTED NOT DETECTED Final   Candida tropicalis NOT DETECTED NOT DETECTED Final   Cryptococcus neoformans/gattii NOT DETECTED NOT DETECTED Final   Methicillin resistance mecA/C DETECTED (A) NOT DETECTED Final    Comment: CRITICAL RESULT CALLED TO, READ BACK BY AND VERIFIED WITH: D,WOFFORD PHARMD _2  01/06/21 EB Performed at [facility], [address], [place], [place] [address]   Culture, blood (routine x 2)     Status: None (Preliminary result)   Collection Time: 01/06/21  4:05 PM   Specimen: BLOOD  Result Value Ref Range Status   Specimen Description   Final    BLOOD LEFT ANTECUBITAL Performed at [place] [address], [place], [place] [address]    Special Requests   Final    BOTTLES DRAWN AEROBIC ONLY Blood Culture adequate volume Performed at [place] [facility] [address], [place], [place] [address]    Culture   Final    NO GROWTH 3 DAYS Performed at [facility], [place] [address] [place], [place] [address]    Report Status PENDING  Incomplete  Culture, blood (routine x 2)     Status: Abnormal   Collection Time: 01/06/21  4:05 PM   Specimen: BLOOD LEFT HAND  Result Value Ref Range Status   Specimen Description   Final    BLOOD LEFT HAND Performed at [facility] [address], [place], [place] [address]    Special Requests   Final    BOTTLES DRAWN  AEROBIC ONLY Blood Culture results may not be optimal due to an inadequate volume of blood received in culture bottles Performed at [facility], [place] [address], [place], [place] [address]    Culture  Setup Time   Final    AEROBIC BOTTLE ONLY GRAM POSITIVE COCCI CRITICAL VALUE NOTED.  VALUE IS CONSISTENT WITH PREVIOUSLY REPORTED AND CALLED VALUE.    Culture (A)  Final    STAPHYLOCOCCUS HOMINIS THE SIGNIFICANCE OF ISOLATING THIS ORGANISM FROM A SINGLE SET OF BLOOD CULTURES WHEN MULTIPLE SETS ARE DRAWN IS UNCERTAIN. PLEASE NOTIFY THE MICROBIOLOGY DEPARTMENT WITHIN ONE WEEK IF SPECIATION AND SENSITIVITIES ARE REQUIRED. Performed at [facility], [place] [address] [place], [place] [address]    Report Status 01/09/2021 FINAL  Final         Radiology Studies: No results found. Scheduled Meds: . Chlorhexidine Gluconate Cloth  6 each Topical Daily  . famciclovir  500 mg Oral Daily  . fluconazole  100 mg Oral Daily  . folic acid  2 mg Oral Daily  . gabapentin  300 mg Oral 2 times per day   And  . gabapentin  600 mg Oral QHS  . levothyroxine  112 mcg Oral Q0600  . mouth rinse  15 mL Mouth Rinse BID  . multivitamin with minerals  1 tablet Oral Daily  . pantoprazole  40 mg Oral BID  . polycarbophil  1,250 mg Oral Q lunch  . predniSONE  80 mg Oral Q breakfast  . sertraline  50 mg Oral Daily  . sucralfate  1 g Oral BID   Continuous Infusions: . heparin 800 Units/hr (01/09/21 0600)     LOS: 6 days    Time spent: 25 mins    PARDEEP KUMAR, MD [facility]   If 7PM-7AM, please contact night-coverage

## 2021-01-09 NOTE — Progress Notes (Signed)
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  Patient currently denies fever, headache, chest pain, cough, abdominal/back pain, nausea, vomiting or bleeding.  She does have some mild dyspnea and is very anxious.  Past Medical History:  Diagnosis Date  . Dysphagia   . HYPERTHYROIDISM   . LYMPHADENOPATHY   . MIGRAINE HEADACHE   . Neuromuscular disorder ([place])    condition where muscle separates from bone left chest, quarter size, pain intermittent   Past Surgical History:  Procedure Laterality Date  . BALLOON DILATION N/A 09/18/2020   Procedure: BALLOON DILATION;  Surgeon: Ronnette Juniper, MD;  Location: [facility];  Service: Gastroenterology;  Laterality: N/A;  . ESOPHAGOGASTRODUODENOSCOPY (EGD) WITH PROPOFOL N/A 09/18/2020   Procedure: ESOPHAGOGASTRODUODENOSCOPY (EGD) WITH PROPOFOL;  Surgeon: Ronnette Juniper, MD;  Location: [facility];  Service: Gastroenterology;  Laterality: N/A;  . EYE SURGERY Bilateral    Cataract L eye 1-20, R eye 3-20   . FOREIGN BODY REMOVAL  09/18/2020   Procedure: FOREIGN BODY REMOVAL;  Surgeon: Ronnette Juniper, MD;  Location: [facility];  Service: Gastroenterology;;  . [place]  2001  . REVERSE SHOULDER ARTHROPLASTY Right 03/16/2019   Procedure: REVERSE SHOULDER ARTHROPLASTY;  Surgeon: Netta Cedars, MD;  Location: [facility];  Service: Orthopedics;  Laterality: Right;  interscalene block  . TUBAL LIGATION        Allergies: Aspirin, Cephalexin, Imipenem, Losartan potassium-hctz, Oxycodone, Statins, Clindamycin/lincomycin, Contrast media [iodinated  diagnostic agents], Penicillins, and Quinolones  Medications: Prior to Admission medications   Medication Sig Start Date End Date Taking? Authorizing Provider  acetaminophen (TYLENOL) 500 MG tablet Take 1,000 mg by mouth every 6 (six) hours as needed for headache.   Yes [provider]  Calcium Carbonate+Vitamin D 600-200 MG-UNIT TABS Take 2 tablets by mouth 2 (two) times daily.   Yes [provider]  gabapentin (NEURONTIN) 300 MG capsule Take 390m(1 cap) in the morning, 3086m1 cap) in the afternoon and 60072m cap) before bed Patient taking differently: Take 300 mg by mouth See admin instructions. Take 300  mg in the morning, 300 mg in the afternoon and 600m68mfore bed 08/27/20  Yes AherMelvenia Beam  HYDROcodone-acetaminophen (NORCO/VICODIN) 5-325 MG tablet Take 1-2 tablets by mouth every 6 (six) hours as needed for moderate pain or severe pain. Patient taking differently: Take 2 tablets by mouth every 6 (six) hours as needed (headaches). 03/16/19  Yes NorrNetta Cedars  levothyroxine (SYNTHROID) 112 MCG tablet Take 112 mcg by mouth daily before breakfast.   Yes [provider]  LORazepam (ATIVAN) 2 MG tablet Take 2 mg by mouth 3 (three) times daily as needed for anxiety.   Yes [provider]  Menthol, Topical Analgesic, (ICY HOT EX) Apply 1 application topically as needed (back pain).   Yes [provider]  Multiple Vitamins-Minerals (MULTIVITAMIN WITH MINERALS) tablet Take 1 tablet by mouth daily.   Yes [provider]  polycarbophil (FIBERCON) 625 MG tablet Take 1,250 mg by mouth daily.   Yes [provider]  sertraline (ZOLOFT) 50 MG tablet Take 50 mg by mouth daily.   Yes [provider]  sucralfate (CARAFATE) 1 g tablet Take 1 g by mouth 2 (two) times daily.   Yes [provider]     Vital Signs: BP 115/60 (BP Location: Right Arm)   Pulse 70   Temp 98.2 F (36.8 C) (Oral)   Resp 18   Ht 5' 5"   (1.651 m)   Wt 160 lb (72.6 kg)   SpO2 97%   BMI 26.63 kg/m   Physical Exam patient awake, alert and oriented x3.  Chest clear to auscultation bilaterally.  Heart with regular rate and rhythm.  Abdomen soft, positive bowel sounds, nontender.  Trace edema left lower extremity.  Imaging: MR ANGIO HEAD WO CONTRAST  Result Date: 01/05/2021 CLINICAL DATA:  Stroke, follow-up. EXAM: MRA HEAD WITHOUT CONTRAST TECHNIQUE: Angiographic images of the Circle of Willis were obtained using MRA technique without intravenous contrast. COMPARISON:  Brain MRI 01/05/2021. FINDINGS: The intracranial internal carotid arteries are patent. The M1 middle cerebral arteries are patent. No M2 proximal branch occlusion or high-grade proximal stenosis is identified. The anterior cerebral arteries are patent. The intracranial vertebral arteries are patent. The basilar artery is patent. The posterior cerebral arteries are patent. Posterior communicating arteries are present bilaterally. No intracranial aneurysm is identified. IMPRESSION: No intracranial large vessel occlusion or proximal high-grade arterial stenosis. Electronically Signed   By: Kellie Simmering DO   On: 01/05/2021 17:53   ECHOCARDIOGRAM COMPLETE  Result Date: 01/06/2021    ECHOCARDIOGRAM REPORT   Patient Name:   [name] Date of Exam: 01/06/2021 Medical Rec #:  [id number]      Height:       65.0 in Accession #:    [id number]     Weight:       160.0 lb Date of Birth:  Nov 13, 1945      BSA:          1.799 m Patient Age:    9 years       BP:           95/59 mmHg Patient Gender: F              HR:           73 bpm. Exam Location:  Inpatient Procedure: 2D Echo, Color Doppler and Cardiac Doppler Indications:    Stroke I63.9  History:        Patient has prior history of Echocardiogram examinations, most                 recent 07/31/2013. Signs/Symptoms:Dyspnea; Risk                 Factors:Diabetes.  Sonographer:    Bernadene Person RDCS Referring Phys: Rudell Cobb ENNEVER  IMPRESSIONS  1. Left ventricular ejection fraction, by estimation, is 60 to 65%. The left ventricle has normal function. The left ventricle has no regional wall motion abnormalities. Left ventricular diastolic parameters are consistent with Grade I diastolic dysfunction (impaired relaxation).  2. McConnell's sign appears to be present.. Right ventricular systolic function is mildly reduced. The right ventricular size is moderately enlarged. There is moderately elevated pulmonary artery systolic pressure.  3. Right atrial size was moderately dilated.  4. The mitral valve is normal in structure. Mild mitral valve regurgitation. No evidence of mitral stenosis.  5. Tricuspid valve regurgitation is moderate.  6. The aortic valve is tricuspid. Aortic valve regurgitation is not visualized. No aortic stenosis is present.  7. The inferior vena cava is dilated in size with <50% respiratory variability, suggesting right  atrial pressure of 15 mmHg. Comparison(s): Prior images unable to be directly viewed, comparison made by report only. Findings suggest cor pulmonale, possibly acute. Consider pulmonary embolism. Discussed with primary team. FINDINGS  Left Ventricle: Left ventricular ejection fraction, by estimation, is 60 to 65%. The left ventricle has normal function. The left ventricle has no regional wall motion abnormalities. The left ventricular internal cavity size was normal in size. There is  no left ventricular hypertrophy. Left ventricular diastolic parameters are consistent with Grade I diastolic dysfunction (impaired relaxation). Normal left ventricular filling pressure. Right Ventricle: McConnell's sign appears to be present. The right ventricular size is moderately enlarged. No increase in right ventricular wall thickness. Right ventricular systolic function is mildly reduced. There is moderately elevated pulmonary artery systolic pressure. The tricuspid regurgitant velocity is 3.30 m/s, and with an assumed right  atrial pressure of 15 mmHg, the estimated right ventricular systolic pressure is 86.7 mmHg. Left Atrium: Left atrial size was normal in size. Right Atrium: Right atrial size was moderately dilated. Prominent Eustachian valve. Pericardium: There is no evidence of pericardial effusion. Mitral Valve: The mitral valve is normal in structure. Mild mitral valve regurgitation, with centrally-directed jet. No evidence of mitral valve stenosis. Tricuspid Valve: The tricuspid valve is normal in structure. Tricuspid valve regurgitation is moderate . No evidence of tricuspid stenosis. Aortic Valve: The aortic valve is tricuspid. Aortic valve regurgitation is not visualized. No aortic stenosis is present. Pulmonic Valve: The pulmonic valve was normal in structure. Pulmonic valve regurgitation is mild. No evidence of pulmonic stenosis. Aorta: The aortic root is normal in size and structure. Venous: The inferior vena cava is dilated in size with less than 50% respiratory variability, suggesting right atrial pressure of 15 mmHg. IAS/Shunts: No atrial level shunt detected by color flow Doppler.  LEFT VENTRICLE PLAX 2D LVIDd:         4.30 cm  Diastology LVIDs:         2.70 cm  LV e' medial:    7.27 cm/s LV PW:         0.80 cm  LV E/e' medial:  8.8 LV IVS:        0.80 cm  LV e' lateral:   12.90 cm/s LVOT diam:     1.80 cm  LV E/e' lateral: 5.0 LV SV:         55 LV SV Index:   30 LVOT Area:     2.54 cm  RIGHT VENTRICLE TAPSE (M-mode): 1.9 cm LEFT ATRIUM             Index       RIGHT ATRIUM           Index LA diam:        2.60 cm 1.45 cm/m  RA Area:     25.40 cm LA Vol (A2C):   37.8 ml 21.01 ml/m RA Volume:   96.80 ml  53.80 ml/m LA Vol (A4C):   30.6 ml 17.01 ml/m LA Biplane Vol: 35.6 ml 19.79 ml/m  AORTIC VALVE LVOT Vmax:   105.00 cm/s LVOT Vmean:  70.800 cm/s LVOT VTI:    0.215 m  AORTA Ao Root diam: 3.30 cm Ao Asc diam:  3.60 cm MITRAL VALVE               TRICUSPID VALVE MV Area (PHT): 2.29 cm    TR Peak grad:   43.6 mmHg MV  Decel Time: 331 msec    TR Vmax:  330.00 cm/s MV E velocity: 64.30 cm/s MV A velocity: 81.40 cm/s  SHUNTS MV E/A ratio:  0.79        Systemic VTI:  0.22 m                            Systemic Diam: 1.80 cm Sanda Klein MD Electronically signed by Sanda Klein MD Signature Date/Time: 01/06/2021/4:50:13 PM    Final    VAS US CAROTID  Result Date: 01/06/2021 Carotid Arterial Duplex Study Indications:       CVA and Aphasia & Dysarthria. Risk Factors:      Past history of smoking. Other Factors:     Autoimmune hemolytic anemia. Comparison Study:  No previous exams Performing Technologist: Rogelia Rohrer  Examination Guidelines: A complete evaluation includes B-mode imaging, spectral Doppler, color Doppler, and power Doppler as needed of all accessible portions of each vessel. Bilateral testing is considered an integral part of a complete examination. Limited examinations for reoccurring indications may be performed as noted.  Right Carotid Findings: +----------+--------+--------+--------+------------------+------------------+           PSV cm/sEDV cm/sStenosisPlaque DescriptionComments           +----------+--------+--------+--------+------------------+------------------+ CCA Prox  76      17              heterogenous      intimal thickening +----------+--------+--------+--------+------------------+------------------+ CCA Distal76      27                                intimal thickening +----------+--------+--------+--------+------------------+------------------+ ICA Prox  104     35                                intimal thickening +----------+--------+--------+--------+------------------+------------------+ ICA Distal100     26                                                   +----------+--------+--------+--------+------------------+------------------+ ECA       62      7                                                     +----------+--------+--------+--------+------------------+------------------+ +----------+--------+-------+----------------+-------------------+           PSV cm/sEDV cmsDescribe        Arm Pressure (mmHG) +----------+--------+-------+----------------+-------------------+ [id number]             Multiphasic, WNL                    +----------+--------+-------+----------------+-------------------+ +---------+--------+--+--------+--+---------+ VertebralPSV cm/s46EDV cm/s16Antegrade +---------+--------+--+--------+--+---------+  Left Carotid Findings: +----------+--------+--------+--------+------------------+------------------+           PSV cm/sEDV cm/sStenosisPlaque DescriptionComments           +----------+--------+--------+--------+------------------+------------------+ CCA Prox  69      19                                intimal thickening +----------+--------+--------+--------+------------------+------------------+ CCA Distal65      18  intimal thickening +----------+--------+--------+--------+------------------+------------------+ ICA Prox  80      25                                                   +----------+--------+--------+--------+------------------+------------------+ ICA Distal107     36                                                   +----------+--------+--------+--------+------------------+------------------+ ECA       66      16                                                   +----------+--------+--------+--------+------------------+------------------+ +----------+--------+--------+----------------+-------------------+           PSV cm/sEDV cm/sDescribe        Arm Pressure (mmHG) +----------+--------+--------+----------------+-------------------+ [id number]             Multiphasic, WNL                    +----------+--------+--------+----------------+-------------------+  +---------+--------+--+--------+--+---------+ VertebralPSV cm/s62EDV cm/s25Antegrade +---------+--------+--+--------+--+---------+   Summary: Right Carotid: The extracranial vessels were near-normal with only minimal wall                thickening or plaque. Left Carotid: The extracranial vessels were near-normal with only minimal wall               thickening or plaque. Vertebrals:  Bilateral vertebral arteries demonstrate antegrade flow. Subclavians: Normal flow hemodynamics were seen in bilateral subclavian              arteries. *See table(s) above for measurements and observations.  Electronically signed by Curt Jews MD on 01/06/2021 at 5:10:01 PM.    Final    VAS [place] LOWER EXTREMITY VENOUS (DVT)  Result Date: 01/06/2021  Lower Venous DVT Study Indications: Stroke.  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 Comparison Study: No previous exams Performing Technologist: Rogelia Rohrer  Examination Guidelines: A complete evaluation includes B-mode imaging, spectral Doppler, color Doppler, and power Doppler as needed of all accessible portions of each vessel. Bilateral testing is considered an integral part of a complete examination. Limited examinations for reoccurring indications may be performed as noted. The reflux portion of the exam is performed with the patient in reverse Trendelenburg.  +---------+---------------+---------+-----------+----------+-------------------+ RIGHT    CompressibilityPhasicitySpontaneityPropertiesThrombus Aging      +---------+---------------+---------+-----------+----------+-------------------+ CFV      Full           Yes      Yes                                      +---------+---------------+---------+-----------+----------+-------------------+ SFJ      Full                                                             +---------+---------------+---------+-----------+----------+-------------------+  FV Prox  Full           Yes      Yes                                       +---------+---------------+---------+-----------+----------+-------------------+ FV Mid   Full           Yes      Yes                                      +---------+---------------+---------+-----------+----------+-------------------+ FV DistalFull           Yes      Yes                                      +---------+---------------+---------+-----------+----------+-------------------+ PFV      Full                                                             +---------+---------------+---------+-----------+----------+-------------------+ POP      Full           Yes      Yes                                      +---------+---------------+---------+-----------+----------+-------------------+ PTV      Full                                                             +---------+---------------+---------+-----------+----------+-------------------+ PERO     None           No       No                   Acute DVT of one of                                                       paired peroneal                                                           veins               +---------+---------------+---------+-----------+----------+-------------------+   +---------+---------------+---------+-----------+----------+-----------------+ LEFT     CompressibilityPhasicitySpontaneityPropertiesThrombus Aging    +---------+---------------+---------+-----------+----------+-----------------+ CFV      Full           Yes      Yes                                    +---------+---------------+---------+-----------+----------+-----------------+  SFJ      Full                                                           +---------+---------------+---------+-----------+----------+-----------------+ FV Prox  Full           Yes      Yes                                    +---------+---------------+---------+-----------+----------+-----------------+  FV Mid   Full           Yes      Yes                                    +---------+---------------+---------+-----------+----------+-----------------+ FV DistalFull           Yes      Yes                                    +---------+---------------+---------+-----------+----------+-----------------+ PFV      Full                                                           +---------+---------------+---------+-----------+----------+-----------------+ POP      Partial        No       No                   Age Indeterminate +---------+---------------+---------+-----------+----------+-----------------+ PTV      Full                                                           +---------+---------------+---------+-----------+----------+-----------------+ PERO     Full                                                           +---------+---------------+---------+-----------+----------+-----------------+     Summary: BILATERAL: -No evidence of popliteal cyst, bilaterally. RIGHT: - Findings consistent with acute deep vein thrombosis involving the right peroneal veins.  LEFT: - Findings consistent with age indeterminate deep vein thrombosis involving the left popliteal vein.  *See table(s) above for measurements and observations. Electronically signed by Curt Jews MD on 01/06/2021 at 5:09:40 PM.    Final     Labs:  CBC: Recent Labs    01/06/21 0245 01/06/21 1419 01/07/21 0237 01/07/21 2028 01/08/21 0246 01/08/21 0908 01/09/21 0425  WBC 16.3*  --  14.2*  --  12.8*  --  12.5*  HGB 4.5*   < > 7.1* 7.4* 6.8* 8.1* 7.7*  HCT 13.5*   < >  18.9* 23.1* 21.7* 23.3* 23.4*  PLT 234  --  214  --  158  --  172   < > = values in this interval not displayed.    COAGS: Recent Labs    01/02/21 1400  INR 1.1  APTT 29    BMP: Recent Labs    08/21/20 1519 01/02/21 1056 01/05/21 0307 01/06/21 0245 01/07/21 0237 01/09/21 0425  NA 139   < > 137 137 137 138  K 4.5   < > 4.1  3.9 4.2 4.2  CL 102   < > 108 109 108 106  CO2 22   < > 22 22 21* 23  GLUCOSE 95   < > 125* 131* 138* 98  BUN 8   < > 19 19 18 18   CALCIUM 10.1   < > 8.4* 8.4* 8.6* 8.8*  CREATININE 0.51*   < > 0.66 0.65 0.64 0.70  GFRNONAA 96   < > >60 >60 >60 >60  GFRAA 111  --   --   --   --   --    < > = values in this interval not displayed.    LIVER FUNCTION TESTS: Recent Labs    01/05/21 0307 01/06/21 0245 01/07/21 0237 01/09/21 0425  BILITOT 1.1 0.9 1.3* 1.9*  AST 22 31 41 39  ALT 23 31 34 29  ALKPHOS 39 44 46 42  PROT 7.2 8.4* 8.5* 7.2  ALBUMIN 3.1* 3.1* 3.3* 2.9*    Assessment and Plan: 75 y.o. female with past medical history of hypothyroidism, migraine headaches, and polyneuropathy.  She was [facility] to [facility] on 4/15 with generalized weakness, recent abdominal and back pain, nausea, some dyspnea with exertion, mild temperature elevation ,presyncope-dizziness, pounding/ throbbing in her ear.  She was found to be anemic and 2 units of red blood cells were given.  CT abdomen pelvis was negative.  Chest x-ray was negative.  COVID-19 negative.  Patient also with recently diagnosed lower extremity DVT and acute stroke, neurologically improving-on IV heparin; laboratory presentation concerning for severe autoimmune hemolytic anemia; Request now received from oncology for CT-guided bone marrow biopsy for further evaluation, rule out lymphoma/leukemia.Risks and benefits of procedure was discussed with the patient/spouse including, but not limited to bleeding, infection, damage to adjacent structures or low yield requiring additional tests.  All of the questions were answered and there is agreement to proceed.  Consent signed and in chart.  Procedure tentatively scheduled for 4/25   Electronically Signed: D. Rowe Robert, PA-C 01/09/2021, 3:03 PM   I spent a total of 20 minutes at the the patient's bedside AND on the patient's hospital floor or unit, greater than 50% of  which was counseling/coordinating care for CT-guided bone marrow biopsy    Patient ID: [name], female   DOB: 1946-01-02, 75 y.o.   MRN: [id number]

## 2021-01-10 DIAGNOSIS — D591 Autoimmune hemolytic anemia, unspecified: Secondary | ICD-10-CM | POA: Diagnosis not present

## 2021-01-10 LAB — CBC WITH DIFFERENTIAL/PLATELET
Abs Immature Granulocytes: 0.11 10*3/uL — ABNORMAL HIGH (ref 0.00–0.07)
Basophils Absolute: 0 10*3/uL (ref 0.0–0.1)
Basophils Relative: 0 %
Eosinophils Absolute: 0 10*3/uL (ref 0.0–0.5)
Eosinophils Relative: 0 %
HCT: 24.9 % — ABNORMAL LOW (ref 36.0–46.0)
Hemoglobin: 8.1 g/dL — ABNORMAL LOW (ref 12.0–15.0)
Immature Granulocytes: 1 %
Lymphocytes Relative: 13 %
Lymphs Abs: 1.6 10*3/uL (ref 0.7–4.0)
MCH: 36.3 pg — ABNORMAL HIGH (ref 26.0–34.0)
MCHC: 32.5 g/dL (ref 30.0–36.0)
MCV: 111.7 fL — ABNORMAL HIGH (ref 80.0–100.0)
Monocytes Absolute: 1.2 10*3/uL — ABNORMAL HIGH (ref 0.1–1.0)
Monocytes Relative: 10 %
Neutro Abs: 9.8 10*3/uL — ABNORMAL HIGH (ref 1.7–7.7)
Neutrophils Relative %: 76 %
Platelets: 224 10*3/uL (ref 150–400)
RBC: 2.23 MIL/uL — ABNORMAL LOW (ref 3.87–5.11)
WBC: 12.8 10*3/uL — ABNORMAL HIGH (ref 4.0–10.5)
nRBC: 5.6 % — ABNORMAL HIGH (ref 0.0–0.2)

## 2021-01-10 LAB — RETICULOCYTES
Immature Retic Fract: 24.2 % — ABNORMAL HIGH (ref 2.3–15.9)
Retic Count, Absolute: 589.5 10*3/uL — ABNORMAL HIGH (ref 19.0–186.0)
Retic Ct Pct: 25.6 % — ABNORMAL HIGH (ref 0.4–3.1)

## 2021-01-10 LAB — COMPREHENSIVE METABOLIC PANEL
ALT: 25 U/L (ref 0–44)
AST: 29 U/L (ref 15–41)
Albumin: 2.9 g/dL — ABNORMAL LOW (ref 3.5–5.0)
Alkaline Phosphatase: 40 U/L (ref 38–126)
Anion gap: 8 (ref 5–15)
BUN: 18 mg/dL (ref 8–23)
CO2: 26 mmol/L (ref 22–32)
Calcium: 9.4 mg/dL (ref 8.9–10.3)
Chloride: 103 mmol/L (ref 98–111)
Creatinine, Ser: 0.65 mg/dL (ref 0.44–1.00)
GFR, Estimated: >60 mL/min (ref >60)
Glucose, Bld: 108 mg/dL — ABNORMAL HIGH (ref 70–99)
Potassium: 4.3 mmol/L (ref 3.5–5.1)
Sodium: 137 mmol/L (ref 135–145)
Total Bilirubin: 1.9 mg/dL — ABNORMAL HIGH (ref 0.3–1.2)
Total Protein: 7.5 g/dL (ref 6.5–8.1)

## 2021-01-10 LAB — CULTURE, BLOOD (ROUTINE X 2)
Culture: NO GROWTH
Special Requests: ADEQUATE

## 2021-01-10 LAB — HEPARIN LEVEL (UNFRACTIONATED)
Heparin Unfractionated: 0.29 IU/mL — ABNORMAL LOW (ref 0.30–0.70)
Heparin Unfractionated: 0.68 IU/mL (ref 0.30–0.70)
Heparin Unfractionated: 0.48 IU/mL (ref 0.30–0.70)

## 2021-01-10 LAB — LACTATE DEHYDROGENASE: LDH: 413 U/L — ABNORMAL HIGH (ref 98–192)

## 2021-01-10 MED ORDER — LORATADINE 10 MG PO TABS
10.0000 mg | ORAL_TABLET | Freq: Every day | ORAL | Status: DC
  Administered 2021-01-10 – 2021-01-12 (×3): 10 mg via ORAL
  Filled 2021-01-10 (×3): qty 1

## 2021-01-10 MED ORDER — METHOCARBAMOL 500 MG PO TABS
500.0000 mg | ORAL_TABLET | Freq: Three times a day (TID) | ORAL | Status: DC | PRN
  Administered 2021-01-10: 500 mg via ORAL
  Filled 2021-01-10: qty 1

## 2021-01-10 NOTE — Progress Notes (Signed)
Pharmacy: [facility]  Pt's a 75 yo F with severeautoimmune hemolytic anemiaofunknown etiology.She suffered a CVA on 4/18 and has now found to have DVTs and possible PE by echo report. Pharmacy is consulted to dose IV heparin.  - heparin level is therapeutic at 0.48 (goal 0.3-0.5) - hgb 8.1, plts ok - no bleeding documented  Plan: -  Continue heparin drip at 500 ml/hr - daily heparin level - monitor for s/sx bleeding  Dorna Leitz, PharmD, BCPS 01/10/2021 8:05 PM

## 2021-01-10 NOTE — Progress Notes (Signed)
ANTICOAGULATION CONSULT NOTE - Follow Up Consult  Pharmacy Consult for Heparin Indication: DVT/PE, concurrent CVA  Patient Measurements: Height: _0  (165.1 cm) Weight: 72.5 kg (159 lb 13.3 oz) IBW/kg (Calculated) : 57 Heparin Dosing Weight: 71.6 kg   Medications:  Infusions:  . heparin 650 Units/hr (01/10/21 0141)    Assessment: 77 yoF with severe autoimmune hemolytic anemiaofunknown etiology.  Hgb 5.3 on admit, down as low ast 4.3 - transfused 4/16 and again 4/19.  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  Pharmacy is consulted to dose IV heparin.  01/10/2021 10:28 AM   HL 0.68 above goal  Trace amount of blood when blowing nose per RN  Plan noted for bone marrow biopsy Mon 4/25  Goal of Therapy:  Heparin level 0.3-0.5 units/ml Monitor platelets by anticoagulation protocol: Yes   Plan:  Decrease heparin to 500 units/hr  Heparin level in 8 hours Daily heparin level and CBC Continue to monitor closely for s/s bleeding  Napoleon Form  01/10/2021, 10:28 AM

## 2021-01-10 NOTE — Plan of Care (Signed)
  Problem: Clinical Measurements: Goal: Will remain free from infection Outcome: Progressing Goal: Cardiovascular complication will be avoided Outcome: Progressing   Problem: Activity: Goal: Risk for activity intolerance will decrease Outcome: Progressing   Problem: Coping: Goal: Level of anxiety will decrease Outcome: Progressing   Problem: Education: Goal: Knowledge of disease or condition will improve Outcome: Progressing Goal: Knowledge of secondary prevention will improve Outcome: Progressing

## 2021-01-10 NOTE — Progress Notes (Signed)
ANTICOAGULATION CONSULT NOTE - Follow Up Consult  Pharmacy Consult for Heparin Indication: DVT/PE, concurrent CVA  Patient Measurements: Height: _0  (165.1 cm) Weight: 72.5 kg (159 lb 13.3 oz) IBW/kg (Calculated) : 57 Heparin Dosing Weight: 71.6 kg   Medications:  Infusions:  . heparin 600 Units/hr (01/09/21 2211)    Assessment: 78 yoF with severe autoimmune hemolytic anemiaofunknown etiology.  Hgb 5.3 on admit, down as low ast 4.3 - transfused 4/16 and again 4/19.  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  Pharmacy is consulted to dose IV heparin.  01/10/2021  HL 0.29 slightly subtherapeutic on 600 units/hr  No bleeding per RN  Plan noted for bone marrow biopsy Mon 4/25  Goal of Therapy:  Heparin level 0.3-0.5 units/ml Monitor platelets by anticoagulation protocol: Yes   Plan:  Increase heparin to 650 units/hr Heparin level in 8 hours Daily heparin level and CBC Continue to monitor closely for s/s bleeding  Dolly Rias RPh 01/10/2021, 1:35 AM

## 2021-01-10 NOTE — Progress Notes (Signed)
PROGRESS NOTE    [name]  OEV:[id number] DOB: [birth year] DOA: 01/02/2021 PCP: Lorene Dy, MD   Brief Narrative:  This 75 years old female with PMH significant for hypothyroidism and polyneuropathy who presented in the ED with generalized weakness.  Upon ED evaluation she was found to be anemic with a hemoglobin of 5.7 down from 13.0 which is her baseline. She was also found to have elevated bilirubin mostly indirect, elevated LDH and elevated D-dimer.  CT abdomen and pelvis was normal. Stool for occult blood negative.  chest x-ray was normal. 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  Hematology was consulted,  Patient started on prednisone, famciclovir and Diflucan.  She is also found to have  stroke, neurology was consulted.  Patient is started on heparin gtt. for confirmed DVT and suspected PE on echocardiogram.  Plan is for  CT guided bone marrow biopsy by IR on Monday.  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 Stool for occult blood negative, found to have elevated LDH, elevated bilirubin. 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 Continue prednisone, folic acid, IVIG given on 01/05/2021  Blood transfusion completed on 4/19 without overt incident - 1u PRBC since admission Patient remains high risk for transfusion reaction, will attempt to pretreat with antihistamine per protocol  Mild transfusion reaction on 4/16 -blood transfusion stopped immediately without any prolonged issues or incidents She was scheduled for bone marrow biopsy on the 18th,  this was canceled in the setting of acute stroke,   Reticulocyte count improving appropriately with treatment  14.7>>17.1>>21.8>>28.6 Plan: Bone marrow biopsy on Monday.  Hemoglobin 7.1> 6.8 > 8.1>7.7> 8.1 No further transfusion as per hematology since she is maintaining her hemoglobin.  Acute ischemic vs embolic stroke in the setting of above: On 01/05/2021 while in route to CT scanner for bone marrow biopsy,  patient had mental status changes with slurred speech and confusion. Code stroke was called, discussed with teleneurology, MRI shows - "Acute infarcts of the right parietotemporal lobes greater than left frontal lobe. Additional punctate left parietal and right occipital acute infarcts" Neurology consulted given atypical appearance, concern for embolic disease, suggest transfer to [facility] main hospital for further evaluation with neurology, May require TEE pending cultures versus other findings.   Previous plan was to transfer to ICU at main campus on hold given ongoing hematological issues as above. Echo: LVEF 60-65%, no regional wall motion abnormalities. McConnell's sign appears to be present.. Right ventricular systolic function is mildly reduced. The right ventricular size is moderately enlarged. There is moderately elevated pulmonary artery systolic pressure.  # May consider TCD bubble study to rule out PFO as outpt with neuro follow up as it will not change management now. Outpatient follow-up with Dr. Jaynee Eagles in 3 to 4 weeks. Continue heparin gtt. further anticoagulation regimen per heme-onc.  DVT and suspected PE DVT on BLE US Echo consistent with PE Hematology recommended anticoagulation.  He recommended discontinuation of aspirin if anticoagulation is started. Continue heparin gtt. with the plan to transition to oral DOAC. Hypercoagulable work-up negative so far.  Questionable bacteremia vs false positive culture Patient does not meet sepsis criteria. Staph epi 1/2 preliminary - hold abx and follow repeat cultures Echo no vegetation,  may require TEE  to rule out vegetations Will consult  ID if repeat cultures positive given unclear source although bacteremia/vegetations may explain embolic disease. Repeat blood cultures no growth so far.  Hypothyroidism Continue Synthroid at current dose.  Anxiety Continue Zoloft at home dose. Ativan as needed for anxiety.  Polyneuropathy Continue gabapentin. Patient reports chest wall spasms. Resume Robaxin 500 mg every 8 hours as needed.    DVT prophylaxis: Heparin gtt Code Status:  Full code. Family Communication:  No family at bed side. Disposition Plan:   Status is: Inpatient  Remains inpatient appropriate because:Inpatient level of care appropriate due to severity of illness   Dispo: The patient is from: Home              Anticipated d/c is to: Home              Patient currently is not medically stable to d/c.   Difficult to place patient No   Consultants:   Hematology  Neurology  Procedures: TTE on 4/19. TEE pending Bone marrow biopsy pending  Antimicrobials:   Anti-infectives (From admission, onward)   Start     Dose/Rate Route Frequency Ordered Stop   01/02/21 1800  fluconazole (DIFLUCAN) tablet 100 mg        100 mg Oral Daily 01/02/21 1713     01/02/21 1800  famciclovir (FAMVIR) tablet 500 mg        500 mg Oral Daily 01/02/21 1713        Subjective: Patient was seen and examined at bedside.  Overnight events noted.   Patient complains of having chest wall tightness. She reports having Muscle spasm, takes Robaxin at home. Chest wall tightness resolved with Robaxin.  Patient seems very anxious and cries about future. Sat down with her, calmed her down,  explained in detail that she will be fine. Objective: Vitals:   01/09/21 2011 01/09/21 2055 01/10/21 0219 01/10/21 0522  BP:  (!) 111/53 138/74 124/67  Pulse:  60 (!) 59 65  Resp:  _0 Temp:  98.2 F (36.8 C) 98.2 F (36.8 C) (!) 97.5 F (36.4 C)  TempSrc:  Oral Oral Oral  SpO2:  96% 97% 95%  Weight: 72.5 kg     Height: _1   (1.651 m)       Intake/Output Summary (Last 24 hours) at 01/10/2021 1225 Last data filed at 01/10/2021 0602 Gross per 24 hour  Intake 269.98 ml  Output --  Net 269.98 ml   Filed Weights   01/02/21 1035 01/09/21 2011  Weight: 72.6 kg 72.5 kg    Examination:  General exam: Appears Anxious and tense, not in any acute distress. Respiratory system: Clear to auscultation. Respiratory effort normal. Cardiovascular system: S1 & S2 heard, RRR. No JVD, murmurs, rubs, gallops or clicks. No pedal edema. Gastrointestinal system: Abdomen is nondistended, soft and nontender. No organomegaly or masses felt. Normal bowel sounds heard. Central nervous system: Alert and oriented. No focal neurological deficits. Extremities: Symmetric 5 x 5 power.  No edema, no cyanosis, no clubbing. Skin: No rashes, lesions or ulcers Psychiatry: Judgement and insight appear normal. Mood & affect appropriate.     Data Reviewed: I have personally reviewed following labs and imaging studies  CBC: Recent Labs  Lab 01/05/21 0307 01/06/21 0245 01/06/21 1419 01/07/21 0237 01/07/21 2028 01/08/21 0246 01/08/21 0908 01/09/21 0425 01/10/21 0439  WBC 19.5* 16.3*  --  14.2*  --  12.8*  --  12.5* 12.8*  NEUTROABS 15.5*  --   --   --   --   --   --  9.1* 9.8*  HGB 4.3* 4.5*   < > 7.1* 7.4* 6.8* 8.1* 7.7* 8.1*  HCT 13.5* 13.5*   < > 18.9* 23.1* 21.7* 23.3* 23.4* 24.9*  MCV 129.8* 145.2*  --  130.3*  --  116.0*  --  119.4* 111.7*  PLT 266 234  --  214  --  158  --  172 224   < > = values in this interval not displayed.   Basic Metabolic Panel: Recent Labs  Lab 01/05/21 0307 01/06/21 0245 01/07/21 0237 01/09/21 0425 01/10/21 0439  NA 137 137 137 138 137  K 4.1 3.9 4.2 4.2 4.3  CL 108 109 108 106 103  CO2 22 22 21* 23 26  GLUCOSE 125* 131* 138* 98 108*  BUN _0 CREATININE 0.66 0.65 0.64 0.70 0.65  CALCIUM 8.4* 8.4* 8.6* 8.8* 9.4   GFR: Estimated Creatinine Clearance: 63.4 mL/min (by C-G  formula based on SCr of 0.65 mg/dL). Liver Function Tests: Recent Labs  Lab 01/05/21 0307 01/06/21 0245 01/07/21 0237 01/09/21 0425 01/10/21 0439  AST 22 31 41 39 29  ALT 23 31 34 29 25  ALKPHOS 39 44 46 42 40  BILITOT 1.1 0.9 1.3* 1.9* 1.9*  PROT 7.2 8.4* 8.5* 7.2 7.5  ALBUMIN 3.1* 3.1* 3.3* 2.9* 2.9*   No results for input(s): LIPASE, AMYLASE in the last 168 hours. No results for input(s): AMMONIA in the last 168 hours. Coagulation Profile: No results for input(s): INR, PROTIME in the last 168 hours. Cardiac Enzymes: No results for input(s): CKTOTAL, CKMB, CKMBINDEX, TROPONINI in the last 168 hours. BNP (last 3 results) No results for input(s): PROBNP in the last 8760 hours. HbA1C: No results for input(s): HGBA1C in the last 72 hours. CBG: Recent Labs  Lab 01/05/21 1021  GLUCAP 111*   Lipid Profile: No results for input(s): CHOL, HDL, LDLCALC, TRIG, CHOLHDL, LDLDIRECT in the last 72 hours. Thyroid Function Tests: No results for input(s): TSH, T4TOTAL, FREET4, T3FREE, THYROIDAB in the last 72 hours. Anemia Panel: Recent Labs    01/09/21 0425 01/10/21 0439  RETICCTPCT 27.7* 25.6*   Sepsis Labs: No results for input(s): PROCALCITON, LATICACIDVEN in the last 168 hours.  Recent Results (from the past 240 hour(s))  Resp Panel by RT-PCR (Flu A&B, Covid) Nasopharyngeal Swab     Status: None   Collection Time: 01/02/21 12:15 PM   Specimen: Nasopharyngeal Swab; Nasopharyngeal(NP) swabs in vial transport medium  Result Value Ref Range Status   SARS Coronavirus 2 by RT PCR NEGATIVE NEGATIVE Final    Comment: (NOTE) SARS-CoV-2 target nucleic acids are NOT DETECTED.  The SARS-CoV-2 RNA is generally detectable in upper respiratory specimens during the acute phase of infection. The lowest concentration of SARS-CoV-2 viral copies this assay can detect is 138 copies/mL. A negative result does not preclude SARS-Cov-2 infection and should not be used as the sole basis for  treatment or other patient management decisions. A negative result may occur with  improper specimen collection/handling, submission of specimen other than nasopharyngeal swab, presence of viral mutation(s) within the areas targeted by this assay, and inadequate number of viral copies(<138 copies/mL). A negative result must be combined with clinical observations, patient history, and epidemiological information. The expected result is Negative.  Fact Sheet for Patients:  EntrepreneurPulse.com.au  Fact Sheet for Healthcare Providers:  IncredibleEmployment.be  This test is no t yet approved or cleared by the [place] FDA and  has been authorized for detection and/or diagnosis of  SARS-CoV-2 by FDA under an Emergency Use Authorization (EUA). This EUA will remain  in effect (meaning this test can be used) for the duration of the COVID-19 declaration under Section 564(b)(1) of the Act, 21 U.S.C.section 360bbb-3(b)(1), unless the authorization is terminated  or revoked sooner.       Influenza A by PCR NEGATIVE NEGATIVE Final   Influenza B by PCR NEGATIVE NEGATIVE Final    Comment: (NOTE) The Xpert Xpress SARS-CoV-2/FLU/RSV plus assay is intended as an aid in the diagnosis of influenza from Nasopharyngeal swab specimens and should not be used as a sole basis for treatment. Nasal washings and aspirates are unacceptable for Xpert Xpress SARS-CoV-2/FLU/RSV testing.  Fact Sheet for Patients: EntrepreneurPulse.com.au  Fact Sheet for Healthcare Providers: IncredibleEmployment.be  This test is not yet approved or cleared by the [place] FDA and has been authorized for detection and/or diagnosis of SARS-CoV-2 by FDA under an Emergency Use Authorization (EUA). This EUA will remain in effect (meaning this test can be used) for the duration of the COVID-19 declaration under Section 564(b)(1) of the Act, 21  U.S.C. section 360bbb-3(b)(1), unless the authorization is terminated or revoked.  Performed at [facility], [place] [address], [place], [place] [address]   MRSA PCR Screening     Status: None   Collection Time: 01/03/21  6:24 PM   Specimen: Nasal Mucosa; Nasopharyngeal  Result Value Ref Range Status   MRSA by PCR NEGATIVE NEGATIVE Final    Comment:        The GeneXpert MRSA Assay (FDA approved for NASAL specimens only), is one component of a comprehensive MRSA colonization surveillance program. It is not intended to diagnose MRSA infection nor to guide or monitor treatment for MRSA infections. Performed at [facility], [place] [address] [place], [place] [address]   Culture, blood (routine x 2)     Status: None   Collection Time: 01/05/21  3:18 PM   Specimen: BLOOD LEFT FOREARM  Result Value Ref Range Status   Specimen Description   Final    BLOOD LEFT FOREARM Performed at [facility] [address] [place], [place] [address]    Special Requests   Final    BOTTLES DRAWN AEROBIC ONLY Blood Culture adequate volume Performed at [facility] [address], [place], [place] [address]    Culture   Final    NO GROWTH 5 DAYS Performed at [facility], [place] [address], [place], [place] [address]    Report Status 01/10/2021 FINAL  Final  Culture, blood (routine x 2)     Status: Abnormal   Collection Time: 01/05/21  3:18 PM   Specimen: BLOOD  Result Value Ref Range Status   Specimen Description   Final    BLOOD LEFT ANTECUBITAL Performed at [facility] [address], [place], [place] [address]    Special Requests   Final    BOTTLES DRAWN AEROBIC ONLY Blood Culture adequate volume Performed at [place] [address] [place],  [address]    Culture  Setup Time   Final    GRAM POSITIVE COCCI AEROBIC BOTTLE ONLY CRITICAL RESULT CALLED TO,  READ BACK BY AND VERIFIED WITH: D,WOFFORD PHARMD _0  01/06/21 EB    Culture (A)  Final    STAPHYLOCOCCUS EPIDERMIDIS THE SIGNIFICANCE OF ISOLATING THIS ORGANISM FROM A SINGLE SET OF BLOOD CULTURES WHEN MULTIPLE SETS ARE DRAWN IS UNCERTAIN. PLEASE NOTIFY THE MICROBIOLOGY DEPARTMENT WITHIN  ONE WEEK IF SPECIATION AND SENSITIVITIES ARE REQUIRED. Performed at [facility], [place] [address] [place], [place] [address]    Report Status 01/07/2021 FINAL  Final  Blood Culture ID Panel (Reflexed)     Status: Abnormal   Collection Time: 01/05/21  3:18 PM  Result Value Ref Range Status   Enterococcus faecalis NOT DETECTED NOT DETECTED Final   Enterococcus Faecium NOT DETECTED NOT DETECTED Final   Listeria monocytogenes NOT DETECTED NOT DETECTED Final   Staphylococcus species DETECTED (A) NOT DETECTED Final    Comment: CRITICAL RESULT CALLED TO, READ BACK BY AND VERIFIED WITH: D,WOFFORD PHARMD _0  01/06/21 EB    Staphylococcus aureus (BCID) NOT DETECTED NOT DETECTED Final   Staphylococcus epidermidis DETECTED (A) NOT DETECTED Final    Comment: Methicillin (oxacillin) resistant coagulase negative staphylococcus. Possible blood culture contaminant (unless isolated from more than one blood culture draw or clinical case suggests pathogenicity). No antibiotic treatment is indicated for blood  culture contaminants. CRITICAL RESULT CALLED TO, READ BACK BY AND VERIFIED WITH: D,WOFFORD PHARMD _1  01/06/21 EB    Staphylococcus lugdunensis NOT DETECTED NOT DETECTED Final   Streptococcus species NOT DETECTED NOT DETECTED Final   Streptococcus agalactiae NOT DETECTED NOT DETECTED Final   Streptococcus pneumoniae NOT DETECTED NOT DETECTED Final   Streptococcus pyogenes NOT DETECTED NOT DETECTED Final   A.calcoaceticus-baumannii NOT DETECTED NOT DETECTED Final   Bacteroides fragilis NOT DETECTED NOT DETECTED Final   Enterobacterales NOT DETECTED NOT DETECTED Final   Enterobacter cloacae complex NOT  DETECTED NOT DETECTED Final   Escherichia coli NOT DETECTED NOT DETECTED Final   Klebsiella aerogenes NOT DETECTED NOT DETECTED Final   Klebsiella oxytoca NOT DETECTED NOT DETECTED Final   Klebsiella pneumoniae NOT DETECTED NOT DETECTED Final   Proteus species NOT DETECTED NOT DETECTED Final   Salmonella species NOT DETECTED NOT DETECTED Final   Serratia marcescens NOT DETECTED NOT DETECTED Final   Haemophilus influenzae NOT DETECTED NOT DETECTED Final   Neisseria meningitidis NOT DETECTED NOT DETECTED Final   Pseudomonas aeruginosa NOT DETECTED NOT DETECTED Final   Stenotrophomonas maltophilia NOT DETECTED NOT DETECTED Final   Candida albicans NOT DETECTED NOT DETECTED Final   Candida auris NOT DETECTED NOT DETECTED Final   Candida glabrata NOT DETECTED NOT DETECTED Final   Candida krusei NOT DETECTED NOT DETECTED Final   Candida parapsilosis NOT DETECTED NOT DETECTED Final   Candida tropicalis NOT DETECTED NOT DETECTED Final   Cryptococcus neoformans/gattii NOT DETECTED NOT DETECTED Final   Methicillin resistance mecA/C DETECTED (A) NOT DETECTED Final    Comment: CRITICAL RESULT CALLED TO, READ BACK BY AND VERIFIED WITH: D,WOFFORD PHARMD _2  01/06/21 EB Performed at [facility], [address], [place], [place] [address]   Culture, blood (routine x 2)     Status: None (Preliminary result)   Collection Time: 01/06/21  4:05 PM   Specimen: BLOOD  Result Value Ref Range Status   Specimen Description   Final    BLOOD LEFT ANTECUBITAL Performed at [facility] [address], [place], [place] [address]    Special Requests   Final    BOTTLES DRAWN AEROBIC ONLY Blood Culture adequate volume Performed at [facility] [address], [place], [place] [address]    Culture   Final    NO GROWTH 4 DAYS Performed at [facility], [place] [address], [place], [place] [address]    Report Status PENDING  Incomplete  Culture, blood (routine  x  2)     Status: Abnormal   Collection Time: 01/06/21  4:05 PM   Specimen: BLOOD LEFT HAND  Result Value Ref Range Status   Specimen Description   Final    BLOOD LEFT HAND Performed at [facility] [address], [place], [place] [address]    Special Requests   Final    BOTTLES DRAWN AEROBIC ONLY Blood Culture results may not be optimal due to an inadequate volume of blood received in culture bottles Performed at [facility] [address], [place], [place] [address]    Culture  Setup Time   Final    AEROBIC BOTTLE ONLY GRAM POSITIVE COCCI CRITICAL VALUE NOTED.  VALUE IS CONSISTENT WITH PREVIOUSLY REPORTED AND CALLED VALUE.    Culture (A)  Final    STAPHYLOCOCCUS HOMINIS THE SIGNIFICANCE OF ISOLATING THIS ORGANISM FROM A SINGLE SET OF BLOOD CULTURES WHEN MULTIPLE SETS ARE DRAWN IS UNCERTAIN. PLEASE NOTIFY THE MICROBIOLOGY DEPARTMENT WITHIN ONE WEEK IF SPECIATION AND SENSITIVITIES ARE REQUIRED. Performed at [facility], [place] [address], [place], [place] [address]    Report Status 01/09/2021 FINAL  Final         Radiology Studies: No results found. Scheduled Meds: . Chlorhexidine Gluconate Cloth  6 each Topical Daily  . famciclovir  500 mg Oral Daily  . fluconazole  100 mg Oral Daily  . folic acid  2 mg Oral Daily  . gabapentin  300 mg Oral 2 times per day   And  . gabapentin  600 mg Oral QHS  . levothyroxine  112 mcg Oral Q0600  . loratadine  10 mg Oral Daily  . mouth rinse  15 mL Mouth Rinse BID  . multivitamin with minerals  1 tablet Oral Daily  . pantoprazole  40 mg Oral BID  . polycarbophil  1,250 mg Oral Q lunch  . predniSONE  80 mg Oral Q breakfast  . sertraline  50 mg Oral Daily  . sucralfate  1 g Oral BID   Continuous Infusions: . heparin 500 Units/hr (01/10/21 1121)     LOS: 7 days    Time spent: 25 mins    Candy Leverett, MD [facility]   If 7PM-7AM, please contact night-coverage

## 2021-01-11 DIAGNOSIS — E039 Hypothyroidism, unspecified: Secondary | ICD-10-CM | POA: Diagnosis not present

## 2021-01-11 DIAGNOSIS — D72829 Elevated white blood cell count, unspecified: Secondary | ICD-10-CM | POA: Diagnosis not present

## 2021-01-11 DIAGNOSIS — D591 Autoimmune hemolytic anemia, unspecified: Secondary | ICD-10-CM | POA: Diagnosis not present

## 2021-01-11 DIAGNOSIS — D589 Hereditary hemolytic anemia, unspecified: Secondary | ICD-10-CM | POA: Diagnosis not present

## 2021-01-11 DIAGNOSIS — I82402 Acute embolism and thrombosis of unspecified deep veins of left lower extremity: Secondary | ICD-10-CM | POA: Diagnosis not present

## 2021-01-11 LAB — CULTURE, BLOOD (ROUTINE X 2)
Culture: NO GROWTH
Special Requests: ADEQUATE

## 2021-01-11 LAB — CBC WITH DIFFERENTIAL/PLATELET
Abs Immature Granulocytes: 0.14 10*3/uL — ABNORMAL HIGH (ref 0.00–0.07)
Basophils Absolute: 0 10*3/uL (ref 0.0–0.1)
Basophils Relative: 0 %
Eosinophils Absolute: 0 10*3/uL (ref 0.0–0.5)
Eosinophils Relative: 0 %
HCT: 26.6 % — ABNORMAL LOW (ref 36.0–46.0)
Hemoglobin: 9.3 g/dL — ABNORMAL LOW (ref 12.0–15.0)
Immature Granulocytes: 1 %
Lymphocytes Relative: 12 %
Lymphs Abs: 1.7 10*3/uL (ref 0.7–4.0)
MCH: 39.2 pg — ABNORMAL HIGH (ref 26.0–34.0)
MCHC: 35 g/dL (ref 30.0–36.0)
MCV: 112.2 fL — ABNORMAL HIGH (ref 80.0–100.0)
Monocytes Absolute: 1.5 10*3/uL — ABNORMAL HIGH (ref 0.1–1.0)
Monocytes Relative: 11 %
Neutro Abs: 10.9 10*3/uL — ABNORMAL HIGH (ref 1.7–7.7)
Neutrophils Relative %: 76 %
Platelets: 279 10*3/uL (ref 150–400)
RBC: 2.37 MIL/uL — ABNORMAL LOW (ref 3.87–5.11)
WBC: 14.2 10*3/uL — ABNORMAL HIGH (ref 4.0–10.5)
nRBC: 3.9 % — ABNORMAL HIGH (ref 0.0–0.2)

## 2021-01-11 LAB — COMPREHENSIVE METABOLIC PANEL
ALT: 26 U/L (ref 0–44)
AST: 29 U/L (ref 15–41)
Albumin: 3.2 g/dL — ABNORMAL LOW (ref 3.5–5.0)
Alkaline Phosphatase: 47 U/L (ref 38–126)
Anion gap: 9 (ref 5–15)
BUN: 19 mg/dL (ref 8–23)
CO2: 23 mmol/L (ref 22–32)
Calcium: 9.3 mg/dL (ref 8.9–10.3)
Chloride: 102 mmol/L (ref 98–111)
Creatinine, Ser: 0.66 mg/dL (ref 0.44–1.00)
GFR, Estimated: >60 mL/min (ref >60)
Glucose, Bld: 112 mg/dL — ABNORMAL HIGH (ref 70–99)
Potassium: 4.3 mmol/L (ref 3.5–5.1)
Sodium: 134 mmol/L — ABNORMAL LOW (ref 135–145)
Total Bilirubin: 1.9 mg/dL — ABNORMAL HIGH (ref 0.3–1.2)
Total Protein: 8 g/dL (ref 6.5–8.1)

## 2021-01-11 LAB — RETICULOCYTES
Immature Retic Fract: 25.1 % — ABNORMAL HIGH (ref 2.3–15.9)
RBC.: 2.65 MIL/uL — ABNORMAL LOW (ref 3.87–5.11)
Retic Count, Absolute: 421.5 10*3/uL — ABNORMAL HIGH (ref 19.0–186.0)
Retic Ct Pct: 15.9 % — ABNORMAL HIGH (ref 0.4–3.1)

## 2021-01-11 LAB — LACTATE DEHYDROGENASE: LDH: 397 U/L — ABNORMAL HIGH (ref 98–192)

## 2021-01-11 LAB — HEPARIN LEVEL (UNFRACTIONATED): Heparin Unfractionated: 0.35 IU/mL (ref 0.30–0.70)

## 2021-01-11 MED ORDER — DOCUSATE SODIUM 100 MG PO CAPS
100.0000 mg | ORAL_CAPSULE | Freq: Three times a day (TID) | ORAL | Status: DC
  Administered 2021-01-11 – 2021-01-12 (×3): 100 mg via ORAL
  Filled 2021-01-11 (×3): qty 1

## 2021-01-11 NOTE — Plan of Care (Signed)
  Problem: Education: Goal: Knowledge of disease or condition will improve Outcome: Progressing Goal: Knowledge of secondary prevention will improve Outcome: Progressing   

## 2021-01-11 NOTE — Progress Notes (Signed)
PROGRESS NOTE    [name]  PPJ:[id number] DOB: 11/04/1945 DOA: 01/02/2021 PCP: Lorene Dy, MD   Brief Narrative:  This 75 years old female with PMH significant for hypothyroidism and polyneuropathy who presented in the ED with generalized weakness.  Upon ED evaluation she was found to be anemic with a hemoglobin of 5.7 down from 13.0 which is her baseline. She was also found to have elevated bilirubin mostly indirect, elevated LDH and elevated D-dimer.  CT abdomen and pelvis was normal. Stool for occult blood negative. 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  Hematology was consulted,  Patient started on prednisone, has received IVIG dose. She has developed stroke during this hospitalization , Neurology was consulted.  Patient is started on heparin gtt. for confirmed DVT and suspected PE on echocardiogram.  Plan is for CT guided bone marrow biopsy by IR on Monday 4/25.  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 Stool for occult blood negative, found to have elevated LDH, elevated  Indirect bilirubin. 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 Continue prednisone, folic acid, IVIG given on 01/05/2021  Mild transfusion reaction on 4/16 -blood transfusion stopped immediately without any prolonged issues or incidents Blood transfusion completed on 4/19 without overt incident - 1u PRBC since admission Patient remains high risk for transfusion reaction, will attempt to pretreat with antihistamine per protocol  She was scheduled for bone marrow biopsy on the 18th,  this was canceled in the setting of acute stroke,   Reticulocyte count improving appropriately with treatment  14.7>>17.1>>21.8>>28.6 Plan: Bone marrow biopsy on Monday 4/25.  Hemoglobin 7.1> 6.8 > 8.1>7.7> 8.1 >9.3 No further transfusion as per hematology since she is maintaining her hemoglobin.  Acute ischemic vs embolic stroke in the setting of above: On 01/05/2021 while in route to CT scanner for bone marrow biopsy,  patient had mental status changes with slurred speech and confusion. Code stroke was called, discussed with teleneurology, MRI shows - "Acute infarcts of the right parietotemporal lobes greater than left frontal lobe. Additional punctate left parietal and right occipital acute infarcts" Neurology consulted given atypical appearance, concern for embolic disease, suggest transfer to [facility] main hospital for further evaluation with neurology, May require TEE pending cultures versus other findings.   Previous plan was to transfer to ICU at main campus on hold given ongoing hematological issues as above. Echo: LVEF 60-65%, no regional wall motion abnormalities. McConnell's sign appears to be present.. # May consider TCD bubble study to rule out PFO as outpt with neuro follow up as it will not change management now.  Outpatient follow-up with Dr. Jaynee Eagles in 3 to 4 weeks. Continue heparin gtt. further anticoagulation regimen per heme-onc.  DVT and suspected PE DVT  confirmed on BLE US Echo consistent with PE Hematology recommended anticoagulation.  He recommended discontinuation of aspirin if anticoagulation is started. Continue heparin gtt. with the plan to transition to oral DOAC after bone marrow biopsy. Hypercoagulable work-up negative so far.  Questionable bacteremia vs false positive culture Patient does not meet sepsis criteria. Staph epi 1/2 preliminary - hold abx and follow repeat cultures Echo no vegetation,  may require TEE to rule out vegetations Repeat blood cultures: No growth so far.  Hypothyroidism Continue Synthroid at current  dose.  Anxiety Continue Zoloft at home  dose. Ativan as needed for anxiety.  Polyneuropathy Continue gabapentin. Patient reports chest wall spasms. Resume Robaxin 500 mg every 8 hours as needed.    DVT prophylaxis: Heparin gtt Code Status:  Full code. Family Communication:  No family at bed side. Disposition Plan:   Status is: Inpatient  Remains inpatient appropriate because:Inpatient level of care appropriate due to severity of illness   Dispo: The patient is from: Home              Anticipated d/c is to: Home in 1-2 days.              Patient currently is not medically stable to d/c.   Difficult to place patient No   Consultants:   Hematology  Neurology  Procedures: TTE on 4/19. TEE pending Bone marrow biopsy  4/25  Antimicrobials:   Anti-infectives (From admission, onward)   Start     Dose/Rate Route Frequency Ordered Stop   01/02/21 1800  fluconazole (DIFLUCAN) tablet 100 mg        100 mg Oral Daily 01/02/21 1713     01/02/21 1800  famciclovir (FAMVIR) tablet 500 mg        500 mg Oral Daily 01/02/21 1713        Subjective: Patient was seen and examined at bedside.  Overnight events noted.   Patient appears much calmer, cheerful. She denies any chest wall pain.  She is scheduled to have bone marrow biopsy tomorrow.  Objective: Vitals:   01/10/21 1500 01/10/21 2056 01/11/21 0432 01/11/21 1249  BP: 122/70 120/75 133/72 138/80  Pulse: 67 61 60 71  Resp:  _0 Temp: (!) 97.5 F (36.4 C) 98.3 F (36.8 C) 98.2 F (36.8 C) 98.1 F (36.7 C)  TempSrc: Oral Oral Oral Oral  SpO2: 96% 96% 97% 99%  Weight:      Height:        Intake/Output Summary (Last 24 hours) at 01/11/2021 1401 Last data filed at 01/11/2021 1308 Gross per 24 hour  Intake 1345 ml  Output --  Net 1345 ml   Filed Weights   01/02/21 1035 01/09/21 2011  Weight: 72.6 kg 72.5 kg    Examination:  General exam: Appears calmer and cheerful, not in any acute distress. Respiratory system: Clear to auscultation.  Respiratory effort normal. Cardiovascular system: S1 & S2 heard, RRR. No JVD, murmurs, rubs, gallops or clicks. No pedal edema. Gastrointestinal system: Abdomen is nondistended, soft and nontender. No organomegaly or masses felt.  Normal bowel sounds heard. Central nervous system: Alert and oriented. No focal neurological deficits. Extremities: No edema, no cyanosis, no clubbing. Skin: No rashes, lesions or ulcers Psychiatry: Judgement and insight appear normal. Mood & affect appropriate.     Data Reviewed: I have personally reviewed following labs and imaging studies  CBC: Recent Labs  Lab 01/05/21 0307 01/06/21 0245 01/07/21 0237 01/07/21 2028 01/08/21 0246 01/08/21 0908 01/09/21 0425 01/10/21 0439 01/11/21 0625  WBC 19.5*   < > 14.2*  --  12.8*  --  12.5* 12.8* 14.2*  NEUTROABS 15.5*  --   --   --   --   --  9.1* 9.8* 10.9*  HGB 4.3*   < > 7.1*   < > 6.8* 8.1* 7.7* 8.1* 9.3*  HCT 13.5*   < > 18.9*   < > 21.7* 23.3* 23.4* 24.9* 26.6*  MCV 129.8*   < > 130.3*  --  116.0*  --  119.4* 111.7* 112.2*  PLT 266   < > 214  --  158  --  172 224 279   < > = values in this interval not displayed.   Basic Metabolic Panel: Recent Labs  Lab 01/06/21 0245 01/07/21 0237 01/09/21 0425 01/10/21 0439 01/11/21 0625  NA 137 137 138 137 134*  K 3.9 4.2 4.2 4.3 4.3  CL 109 108 106 103 102  CO2 22 21* _0 GLUCOSE 131* 138* 98 108* 112*  BUN _1 CREATININE 0.65 0.64 0.70 0.65 0.66  CALCIUM 8.4* 8.6* 8.8* 9.4 9.3   GFR: Estimated Creatinine Clearance: 63.4 mL/min (by C-G formula based on SCr of 0.66 mg/dL). Liver Function Tests: Recent Labs  Lab 01/06/21 0245 01/07/21 0237 01/09/21 0425 01/10/21 0439 01/11/21 0625  AST 31 41 39 29 29  ALT 31 34 _2 ALKPHOS 44 46 42 40 47  BILITOT 0.9 1.3* 1.9* 1.9* 1.9*  PROT 8.4* 8.5* 7.2 7.5 8.0  ALBUMIN 3.1* 3.3* 2.9* 2.9* 3.2*   No results for input(s): LIPASE, AMYLASE in the last 168 hours. No results for  input(s): AMMONIA in the last 168 hours. Coagulation Profile: No results for input(s): INR, PROTIME in the last 168 hours. Cardiac Enzymes: No results for input(s): CKTOTAL, CKMB, CKMBINDEX, TROPONINI in the last 168 hours. BNP (last 3 results) No results for input(s): PROBNP in the last 8760 hours. HbA1C: No results for input(s): HGBA1C in the last 72 hours. CBG: Recent Labs  Lab 01/05/21 1021  GLUCAP 111*   Lipid Profile: No results for input(s): CHOL, HDL, LDLCALC, TRIG, CHOLHDL, LDLDIRECT in the last 72 hours. Thyroid Function Tests: No results for input(s): TSH, T4TOTAL, FREET4, T3FREE, THYROIDAB in the last 72 hours. Anemia Panel: Recent Labs    01/10/21 0439 01/11/21 0625  RETICCTPCT 25.6* 15.9*   Sepsis Labs: No results for input(s): PROCALCITON, LATICACIDVEN in the last 168 hours.  Recent Results (from the past 240 hour(s))  Resp Panel by RT-PCR (Flu A&B, Covid) Nasopharyngeal Swab     Status: None   Collection Time: 01/02/21 12:15 PM   Specimen: Nasopharyngeal Swab; Nasopharyngeal(NP) swabs in vial transport medium  Result Value Ref Range Status   SARS Coronavirus 2 by RT PCR NEGATIVE NEGATIVE Final    Comment: (NOTE) SARS-CoV-2 target nucleic acids are NOT DETECTED.  The SARS-CoV-2 RNA is generally detectable in upper respiratory specimens during the acute phase of infection. The lowest concentration of SARS-CoV-2 viral copies this assay can detect is 138 copies/mL. A negative result does not preclude SARS-Cov-2 infection and should not be used as the sole basis for treatment or other patient management decisions. A negative result may occur with  improper specimen collection/handling, submission of specimen other than nasopharyngeal swab, presence of viral mutation(s) within the areas targeted by this assay, and inadequate number of viral copies(<138 copies/mL). A negative result must be combined with clinical observations, patient history, and  epidemiological information. The expected result is Negative.  Fact Sheet for Patients:  EntrepreneurPulse.com.au  Fact Sheet for Healthcare Providers:  IncredibleEmployment.be  This test is no t yet approved or cleared by the [place] FDA and  has been authorized for detection and/or diagnosis of SARS-CoV-2 by FDA under an Emergency Use Authorization (EUA). This EUA will remain  in effect (meaning this test can be used) for the duration of the COVID-19 declaration under Section 564(b)(1) of the Act, 21 U.S.C.section 360bbb-3(b)(1), unless the authorization is terminated  or revoked sooner.       Influenza A by PCR NEGATIVE NEGATIVE Final   Influenza B by PCR NEGATIVE NEGATIVE Final    Comment: (NOTE) The Xpert Xpress SARS-CoV-2/FLU/RSV plus assay is intended as an aid in the diagnosis of influenza from Nasopharyngeal swab specimens and should not be used as a sole basis for treatment. Nasal washings and aspirates are unacceptable for Xpert Xpress SARS-CoV-2/FLU/RSV testing.  Fact Sheet for Patients: EntrepreneurPulse.com.au  Fact Sheet for Healthcare Providers: IncredibleEmployment.be  This test is not yet approved or cleared by the [place] FDA and has been authorized for detection and/or diagnosis of SARS-CoV-2 by FDA under an Emergency Use Authorization (EUA). This EUA will remain in effect (meaning this test can be used) for the duration of the COVID-19 declaration under Section 564(b)(1) of the Act, 21 U.S.C. section 360bbb-3(b)(1), unless the authorization is terminated or revoked.  Performed at [facility], [place] [address] [place], [place] [address]   MRSA PCR Screening     Status: None   Collection Time: 01/03/21  6:24 PM   Specimen: Nasal Mucosa; Nasopharyngeal  Result Value Ref Range Status   MRSA by PCR NEGATIVE NEGATIVE Final    Comment:        The  GeneXpert MRSA Assay (FDA approved for NASAL specimens only), is one component of a comprehensive MRSA colonization surveillance program. It is not intended to diagnose MRSA infection nor to guide or monitor treatment for MRSA infections. Performed at [facility], [place] [address] [place], [place] [address]   Culture, blood (routine x 2)     Status: None   Collection Time: 01/05/21  3:18 PM   Specimen: BLOOD LEFT FOREARM  Result Value Ref Range Status   Specimen Description   Final    BLOOD LEFT FOREARM Performed at [place] [address] [place], [place] [address]    Special Requests   Final    BOTTLES DRAWN AEROBIC ONLY Blood Culture adequate volume Performed at [place] [address] [place], [place] [address]    Culture   Final    NO GROWTH 5 DAYS Performed at [facility], [place] [address] [place], [place] [address]    Report Status 01/10/2021 FINAL  Final  Culture, blood (routine x 2)     Status: Abnormal   Collection Time: 01/05/21  3:18 PM   Specimen: BLOOD  Result Value Ref Range Status   Specimen Description   Final    BLOOD LEFT ANTECUBITAL Performed at [place] [address] [place], [place] [address]    Special Requests   Final    BOTTLES DRAWN AEROBIC ONLY Blood Culture adequate volume Performed at [place] [address], [place], [place] [address]    Culture  Setup Time   Final    GRAM POSITIVE COCCI AEROBIC BOTTLE ONLY CRITICAL RESULT CALLED TO, READ BACK BY AND VERIFIED WITH: D,WOFFORD PHARMD _0  01/06/21 EB    Culture (A)  Final    STAPHYLOCOCCUS EPIDERMIDIS THE SIGNIFICANCE OF ISOLATING THIS ORGANISM FROM A SINGLE SET OF BLOOD CULTURES WHEN MULTIPLE SETS ARE DRAWN IS UNCERTAIN. PLEASE NOTIFY THE MICROBIOLOGY DEPARTMENT WITHIN ONE WEEK IF SPECIATION AND SENSITIVITIES ARE REQUIRED. Performed at [facility], [place] [address], [place],  [address]    Report Status 01/07/2021 FINAL  Final  Blood Culture ID Panel (Reflexed)     Status: Abnormal  Collection Time: 01/05/21  3:18 PM  Result Value Ref Range Status   Enterococcus faecalis NOT DETECTED NOT DETECTED Final   Enterococcus Faecium NOT DETECTED NOT DETECTED Final   Listeria monocytogenes NOT DETECTED NOT DETECTED Final   Staphylococcus species DETECTED (A) NOT DETECTED Final    Comment: CRITICAL RESULT CALLED TO, READ BACK BY AND VERIFIED WITH: D,WOFFORD PHARMD _0  01/06/21 EB    Staphylococcus aureus (BCID) NOT DETECTED NOT DETECTED Final   Staphylococcus epidermidis DETECTED (A) NOT DETECTED Final    Comment: Methicillin (oxacillin) resistant coagulase negative staphylococcus. Possible blood culture contaminant (unless isolated from more than one blood culture draw or clinical case suggests pathogenicity). No antibiotic treatment is indicated for blood  culture contaminants. CRITICAL RESULT CALLED TO, READ BACK BY AND VERIFIED WITH: D,WOFFORD PHARMD _1  01/06/21 EB    Staphylococcus lugdunensis NOT DETECTED NOT DETECTED Final   Streptococcus species NOT DETECTED NOT DETECTED Final   Streptococcus agalactiae NOT DETECTED NOT DETECTED Final   Streptococcus pneumoniae NOT DETECTED NOT DETECTED Final   Streptococcus pyogenes NOT DETECTED NOT DETECTED Final   A.calcoaceticus-baumannii NOT DETECTED NOT DETECTED Final   Bacteroides fragilis NOT DETECTED NOT DETECTED Final   Enterobacterales NOT DETECTED NOT DETECTED Final   Enterobacter cloacae complex NOT DETECTED NOT DETECTED Final   Escherichia coli NOT DETECTED NOT DETECTED Final   Klebsiella aerogenes NOT DETECTED NOT DETECTED Final   Klebsiella oxytoca NOT DETECTED NOT DETECTED Final   Klebsiella pneumoniae NOT DETECTED NOT DETECTED Final   Proteus species NOT DETECTED NOT DETECTED Final   Salmonella species NOT DETECTED NOT DETECTED Final   Serratia marcescens NOT DETECTED NOT  DETECTED Final   Haemophilus influenzae NOT DETECTED NOT DETECTED Final   Neisseria meningitidis NOT DETECTED NOT DETECTED Final   Pseudomonas aeruginosa NOT DETECTED NOT DETECTED Final   Stenotrophomonas maltophilia NOT DETECTED NOT DETECTED Final   Candida albicans NOT DETECTED NOT DETECTED Final   Candida auris NOT DETECTED NOT DETECTED Final   Candida glabrata NOT DETECTED NOT DETECTED Final   Candida krusei NOT DETECTED NOT DETECTED Final   Candida parapsilosis NOT DETECTED NOT DETECTED Final   Candida tropicalis NOT DETECTED NOT DETECTED Final   Cryptococcus neoformans/gattii NOT DETECTED NOT DETECTED Final   Methicillin resistance mecA/C DETECTED (A) NOT DETECTED Final    Comment: CRITICAL RESULT CALLED TO, READ BACK BY AND VERIFIED WITH: D,WOFFORD PHARMD _2  01/06/21 EB Performed at [facility], [address] [place], [place] [address]   Culture, blood (routine x 2)     Status: None   Collection Time: 01/06/21  4:05 PM   Specimen: BLOOD  Result Value Ref Range Status   Specimen Description   Final    BLOOD LEFT ANTECUBITAL Performed at [place] [address] [place], [place] [address]    Special Requests   Final    BOTTLES DRAWN AEROBIC ONLY Blood Culture adequate volume Performed at [place] [address], [place], [place] [address]    Culture   Final    NO GROWTH 5 DAYS Performed at [facility], [place] [address], [place], [place] [address]    Report Status 01/11/2021 FINAL  Final  Culture, blood (routine x 2)     Status: Abnormal   Collection Time: 01/06/21  4:05 PM   Specimen: BLOOD LEFT HAND  Result Value Ref Range Status   Specimen Description   Final    BLOOD LEFT HAND Performed at [facility]  [facility], [place] [address], [place], [place] [address]    Special Requests   Final    BOTTLES DRAWN AEROBIC ONLY Blood Culture results may not be optimal due to an inadequate volume of blood  received in culture bottles Performed at [place] [address], [place], [place] [address]    Culture  Setup Time   Final    AEROBIC BOTTLE ONLY GRAM POSITIVE COCCI CRITICAL VALUE NOTED.  VALUE IS CONSISTENT WITH PREVIOUSLY REPORTED AND CALLED VALUE.    Culture (A)  Final    STAPHYLOCOCCUS HOMINIS THE SIGNIFICANCE OF ISOLATING THIS ORGANISM FROM A SINGLE SET OF BLOOD CULTURES WHEN MULTIPLE SETS ARE DRAWN IS UNCERTAIN. PLEASE NOTIFY THE MICROBIOLOGY DEPARTMENT WITHIN ONE WEEK IF SPECIATION AND SENSITIVITIES ARE REQUIRED. Performed at [facility], [place] [address], [place], [place] [address]    Report Status 01/09/2021 FINAL  Final    Radiology Studies: No results found. Scheduled Meds: . Chlorhexidine Gluconate Cloth  6 each Topical Daily  . docusate sodium  100 mg Oral TID  . famciclovir  500 mg Oral Daily  . fluconazole  100 mg Oral Daily  . folic acid  2 mg Oral Daily  . gabapentin  300 mg Oral 2 times per day   And  . gabapentin  600 mg Oral QHS  . levothyroxine  112 mcg Oral Q0600  . loratadine  10 mg Oral Daily  . mouth rinse  15 mL Mouth Rinse BID  . multivitamin with minerals  1 tablet Oral Daily  . pantoprazole  40 mg Oral BID  . polycarbophil  1,250 mg Oral Q lunch  . predniSONE  80 mg Oral Q breakfast  . sertraline  50 mg Oral Daily  . sucralfate  1 g Oral BID   Continuous Infusions: . heparin 500 Units/hr (01/10/21 1121)     LOS: 8 days    Time spent: 25 mins    Karyn Brull, MD [facility]   If 7PM-7AM, please contact night-coverage

## 2021-01-11 NOTE — Progress Notes (Signed)
ANTICOAGULATION CONSULT NOTE - Follow Up Consult  Pharmacy Consult for Heparin Indication: DVT/PE, concurrent CVA  Patient Measurements: Height: 5' 5" (165.1 cm) Weight: 72.5 kg (159 lb 13.3 oz) IBW/kg (Calculated) : 57 Heparin Dosing Weight: 71.6 kg   Medications:  Infusions:  . heparin 500 Units/hr (01/10/21 1121)    Assessment: 57 yoF with severe autoimmune hemolytic anemiaofunknown etiology.  Hgb 5.3 on admit, down as low ast 4.3 - transfused 4/16 and again 4/19.  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  Pharmacy is consulted to dose IV heparin.  01/11/2021 10:28 AM   HL 0.35 remains at goal  CBC stable  Trace amount of blood when blowing nose per RN 4/23  4/24 RN reports nosebleed off and on with large clot this AM when patient blew her nose  Plan noted for bone marrow biopsy Mon 4/25  Goal of Therapy:  Heparin level 0.35 remains at goal Monitor platelets by anticoagulation protocol: Yes   Plan:  Continue heparin infusion at 500 units/hr Daily heparin level and CBC Continue to monitor closely for s/s bleeding  Napoleon Form  01/11/2021, 10:28 AM

## 2021-01-11 NOTE — Progress Notes (Signed)
Chaplain responded to page per pt request for prayers prior to bone marrow test early tomorrow morning.  Chaplain and [name] have established a relationship throughout her stay here so today was alike a homecoming for both of [place]. Chaplain complimented [name] on how well she is looking. [name] reports less anxiety and is not worried about the procedure tomorrow.  Chaplain prayed at bedside offering praise and thanksgiving for [name]'s progress and praying for peace as she goes to surgery tomorrow.  Chaplain is on duty tomorrow so will follow up.  [name] Chaplain

## 2021-01-11 NOTE — Progress Notes (Signed)
Ms. Sporer really looks good.  She is now up on 4 E.  She still is on a heparin infusion.  I think she will have the bone marrow biopsy on Monday.  Yesterday, her hemoglobin is 8.1.  Her reticulocyte count is slowly coming down.  Hopefully, this will continue to trend downward and her hemoglobin will trend upward.  She is on prednisone.  She received the IVIG a week ago.  Hopefully this is starting to help [place] out.  She is walking from what she says.  She is eating a little bit better.  Her blood sugars actually doing quite nicely.  Her LDH is slowly trending downward.  Her bilirubin is 1.9 and stable.  She is alert and oriented.  This CVA that she had certainly has been transient.  Her vital signs all are stable.  Temperature 98.2.  Pulse 60.  Blood pressure 133/72.  Her head and neck exam is unremarkable.  There is no scleral icterus.  Her lungs are clear.  Cardiac exam regular rate and rhythm.  Abdomen is soft.  Bowel sounds are present.  Extremities shows no clubbing, cyanosis or edema.  Neurological exam is nonfocal.  We had a very good prayer.  Faith remains incredibly strong.  I think the great staff on 4 E. for helping out now.  Hopefully, if we see everything stable or trending upward, it should be able to go home after the bone marrow test is completed.  When she has a bone marrow test done, I would put her on oral anticoagulation.  So far, her hypercoagulable studies are normal.  Lattie Haw, MD  [place] 14:14

## 2021-01-12 ENCOUNTER — Encounter (HOSPITAL_COMMUNITY): Payer: Self-pay | Admitting: Family Medicine

## 2021-01-12 ENCOUNTER — Inpatient Hospital Stay (HOSPITAL_COMMUNITY): Payer: Medicare Other

## 2021-01-12 ENCOUNTER — Other Ambulatory Visit (HOSPITAL_COMMUNITY): Payer: Self-pay

## 2021-01-12 DIAGNOSIS — E039 Hypothyroidism, unspecified: Secondary | ICD-10-CM | POA: Diagnosis not present

## 2021-01-12 DIAGNOSIS — F419 Anxiety disorder, unspecified: Secondary | ICD-10-CM

## 2021-01-12 DIAGNOSIS — D72829 Elevated white blood cell count, unspecified: Secondary | ICD-10-CM | POA: Diagnosis not present

## 2021-01-12 DIAGNOSIS — D589 Hereditary hemolytic anemia, unspecified: Secondary | ICD-10-CM | POA: Diagnosis not present

## 2021-01-12 DIAGNOSIS — I82409 Acute embolism and thrombosis of unspecified deep veins of unspecified lower extremity: Secondary | ICD-10-CM

## 2021-01-12 DIAGNOSIS — I82402 Acute embolism and thrombosis of unspecified deep veins of left lower extremity: Secondary | ICD-10-CM | POA: Diagnosis not present

## 2021-01-12 DIAGNOSIS — I2699 Other pulmonary embolism without acute cor pulmonale: Secondary | ICD-10-CM

## 2021-01-12 DIAGNOSIS — D591 Autoimmune hemolytic anemia, unspecified: Secondary | ICD-10-CM | POA: Diagnosis not present

## 2021-01-12 LAB — CBC WITH DIFFERENTIAL/PLATELET
Abs Immature Granulocytes: 0.14 10*3/uL — ABNORMAL HIGH (ref 0.00–0.07)
Basophils Absolute: 0 10*3/uL (ref 0.0–0.1)
Basophils Relative: 0 %
Eosinophils Absolute: 0 10*3/uL (ref 0.0–0.5)
Eosinophils Relative: 0 %
HCT: 27.9 % — ABNORMAL LOW (ref 36.0–46.0)
HCT: 28 % — ABNORMAL LOW (ref 36.0–46.0)
Hemoglobin: 10.1 g/dL — ABNORMAL LOW (ref 12.0–15.0)
Hemoglobin: 9.9 g/dL — ABNORMAL LOW (ref 12.0–15.0)
Immature Granulocytes: 1 %
Lymphocytes Relative: 9 %
Lymphs Abs: 1.3 10*3/uL (ref 0.7–4.0)
MCH: 40.9 pg — ABNORMAL HIGH (ref 26.0–34.0)
MCH: 42.6 pg — ABNORMAL HIGH (ref 26.0–34.0)
MCHC: 35.4 g/dL (ref 30.0–36.0)
MCHC: 36.2 g/dL — ABNORMAL HIGH (ref 30.0–36.0)
MCV: 117.7 fL — ABNORMAL HIGH (ref 80.0–100.0)
MCV: >110 fL — ABNORMAL HIGH (ref 80.0–100.0)
Monocytes Absolute: 1.5 10*3/uL — ABNORMAL HIGH (ref 0.1–1.0)
Monocytes Relative: 11 %
Neutro Abs: 11.3 10*3/uL — ABNORMAL HIGH (ref 1.7–7.7)
Neutrophils Relative %: 79 %
Platelets: 323 10*3/uL (ref 150–400)
Platelets: 333 10*3/uL (ref 150–400)
RBC: 2.37 MIL/uL — ABNORMAL LOW (ref 3.87–5.11)
RBC: 2.42 MIL/uL — ABNORMAL LOW (ref 3.87–5.11)
WBC: 14.1 10*3/uL — ABNORMAL HIGH (ref 4.0–10.5)
WBC: 14.3 10*3/uL — ABNORMAL HIGH (ref 4.0–10.5)
nRBC: 3.3 % — ABNORMAL HIGH (ref 0.0–0.2)
nRBC: 3.4 % — ABNORMAL HIGH (ref 0.0–0.2)

## 2021-01-12 LAB — COMPREHENSIVE METABOLIC PANEL
ALT: 29 U/L (ref 0–44)
AST: 32 U/L (ref 15–41)
Albumin: 3.3 g/dL — ABNORMAL LOW (ref 3.5–5.0)
Alkaline Phosphatase: 45 U/L (ref 38–126)
Anion gap: 10 (ref 5–15)
BUN: 18 mg/dL (ref 8–23)
CO2: 23 mmol/L (ref 22–32)
Calcium: 9.5 mg/dL (ref 8.9–10.3)
Chloride: 105 mmol/L (ref 98–111)
Creatinine, Ser: 0.58 mg/dL (ref 0.44–1.00)
GFR, Estimated: >60 mL/min (ref >60)
Glucose, Bld: 115 mg/dL — ABNORMAL HIGH (ref 70–99)
Potassium: 4.6 mmol/L (ref 3.5–5.1)
Sodium: 138 mmol/L (ref 135–145)
Total Bilirubin: 1.8 mg/dL — ABNORMAL HIGH (ref 0.3–1.2)
Total Protein: 8 g/dL (ref 6.5–8.1)

## 2021-01-12 LAB — LACTATE DEHYDROGENASE: LDH: 412 U/L — ABNORMAL HIGH (ref 98–192)

## 2021-01-12 LAB — FACTOR 5 LEIDEN

## 2021-01-12 LAB — RETICULOCYTES
Immature Retic Fract: 23.8 % — ABNORMAL HIGH (ref 2.3–15.9)
RBC.: 2.8 MIL/uL — ABNORMAL LOW (ref 3.87–5.11)
Retic Count, Absolute: 451 10*3/uL — ABNORMAL HIGH (ref 19.0–186.0)
Retic Ct Pct: 16.1 % — ABNORMAL HIGH (ref 0.4–3.1)

## 2021-01-12 LAB — HEPARIN LEVEL (UNFRACTIONATED): Heparin Unfractionated: 0.59 IU/mL (ref 0.30–0.70)

## 2021-01-12 MED ORDER — FLUMAZENIL 0.5 MG/5ML IV SOLN
INTRAVENOUS | Status: AC
  Filled 2021-01-12: qty 5

## 2021-01-12 MED ORDER — FLUCONAZOLE 100 MG PO TABS: 100.0000 mg | ORAL_TABLET | Freq: Every day | ORAL | 0 refills | Status: DC

## 2021-01-12 MED ORDER — PREDNISONE 20 MG PO TABS
60.0000 mg | ORAL_TABLET | Freq: Every day | ORAL | Status: DC
  Administered 2021-01-12: 60 mg via ORAL
  Filled 2021-01-12: qty 3

## 2021-01-12 MED ORDER — MIDAZOLAM HCL 2 MG/2ML IJ SOLN
INTRAMUSCULAR | Status: AC
  Filled 2021-01-12: qty 4

## 2021-01-12 MED ORDER — RIVAROXABAN (XARELTO) VTE STARTER PACK (15 & 20 MG)
ORAL_TABLET | ORAL | 0 refills | Status: DC
  Filled 2021-01-12: qty 51, 30d supply, fill #0
  Filled 2021-01-12: qty 1, 28d supply, fill #0

## 2021-01-12 MED ORDER — FENTANYL CITRATE (PF) 100 MCG/2ML IJ SOLN
INTRAMUSCULAR | Status: AC | PRN
  Administered 2021-01-12 (×2): 50 ug via INTRAVENOUS

## 2021-01-12 MED ORDER — MIDAZOLAM HCL 2 MG/2ML IJ SOLN
INTRAMUSCULAR | Status: AC | PRN
  Administered 2021-01-12 (×2): 1 mg via INTRAVENOUS

## 2021-01-12 MED ORDER — RIVAROXABAN 20 MG PO TABS: 20.0000 mg | ORAL_TABLET | Freq: Every day | ORAL | Status: DC

## 2021-01-12 MED ORDER — FENTANYL CITRATE (PF) 100 MCG/2ML IJ SOLN
INTRAMUSCULAR | Status: AC
  Filled 2021-01-12: qty 2

## 2021-01-12 MED ORDER — PREDNISONE 20 MG PO TABS: 60.0000 mg | ORAL_TABLET | Freq: Every day | ORAL | 0 refills | Status: AC

## 2021-01-12 MED ORDER — SODIUM CHLORIDE 0.9 % IV SOLN
INTRAVENOUS | Status: AC
  Filled 2021-01-12: qty 250

## 2021-01-12 MED ORDER — NALOXONE HCL 0.4 MG/ML IJ SOLN
INTRAMUSCULAR | Status: AC
  Filled 2021-01-12: qty 1

## 2021-01-12 MED ORDER — FAMCICLOVIR 500 MG PO TABS: 500.0000 mg | ORAL_TABLET | Freq: Every day | ORAL | 0 refills | Status: DC

## 2021-01-12 MED ORDER — RIVAROXABAN 15 MG PO TABS
15.0000 mg | ORAL_TABLET | Freq: Two times a day (BID) | ORAL | Status: DC
  Administered 2021-01-12: 15 mg via ORAL
  Filled 2021-01-12: qty 1

## 2021-01-12 MED ORDER — PANTOPRAZOLE SODIUM 40 MG PO TBEC: 40.0000 mg | DELAYED_RELEASE_TABLET | Freq: Two times a day (BID) | ORAL | 0 refills | Status: DC

## 2021-01-12 MED ORDER — RIVAROXABAN (XARELTO) VTE STARTER PACK (15 & 20 MG): ORAL_TABLET | ORAL | 0 refills | Status: DC

## 2021-01-12 MED ORDER — LIDOCAINE HCL (PF) 1 % IJ SOLN
INTRAMUSCULAR | Status: AC | PRN
Start: 2021-01-12 — End: 2021-01-12
  Administered 2021-01-12: 10 mL via INTRADERMAL

## 2021-01-12 NOTE — TOC Transition Note (Signed)
Transition of Care [facility]) - CM/SW Discharge Note   Patient Details  Name: [name] MRN: [id number] Date of Birth: December 29, 1945  Transition of Care [facility]) CM/SW Contact:  Lanier Clam, RN Phone Number: 01/12/2021, 12:41 PM   Clinical Narrative:d/c home w/[facility]-patient agree to [facility] Start of Care Saturday-MD notified. No further CM needs.       Final next level of care: Home w Home Health Services Barriers to Discharge: No Barriers Identified   Patient Goals and CMS Choice Patient states their goals for this hospitalization and ongoing recovery are:: to go home CMS Medicare.gov Compare Post Acute Care list provided to:: Patient Choice offered to / list presented to : Patient  Discharge Placement                       Discharge Plan and Services   Discharge Planning Services: CM Consult                      HH Arranged: PT [facility] Agency: [facility] Date [facility] Agency Contacted: 01/12/21 Time HH Agency Contacted: 1241 Representative spoke with at [facility] Agency: Pearson Grippe  Social Determinants of Health (SDOH) Interventions     Readmission Risk Interventions No flowsheet data found.

## 2021-01-12 NOTE — Progress Notes (Signed)
Discharge instructions provided and discussed. Addressed all questions and concerns. IV removed and intact. Val Eagle

## 2021-01-12 NOTE — Progress Notes (Signed)
ANTICOAGULATION CONSULT NOTE - Follow Up Consult  Pharmacy Consult for Heparin Indication: DVT/PE, concurrent CVA  Patient Measurements: Height: _0  (165.1 cm) Weight: 72.5 kg (159 lb 13.3 oz) IBW/kg (Calculated) : 57 Heparin Dosing Weight: 71.6 kg   Medications:  Infusions:  . heparin 500 Units/hr (01/11/21 2134)    Assessment: 69 yoF with severe autoimmune hemolytic anemiaofunknown etiology.  Hgb 5.3 on admit, down as low ast 4.3 - transfused 4/16 and again 4/19.  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  Pharmacy is consulted to dose IV heparin.  01/12/2021 6:26 AM   HL 0.59 above goal  CBC stable (4/24)  No bleeding noted  Plan noted for bone marrow biopsy Mon 4/25  Goal of Therapy:  Heparin level 0.3-0.5 Monitor platelets by anticoagulation protocol: Yes   Plan:  decrease heparin infusion to 450 units/hr Daily heparin level and CBC Continue to monitor closely for s/s bleeding  Dolly Rias RPh 01/12/2021, 6:28 AM

## 2021-01-12 NOTE — Discharge Instructions (Signed)

## 2021-01-12 NOTE — Care Management Important Message (Signed)
Important Message  Patient Details IM Letter given to the Patient. Name: [name] MRN: [id number] Date of Birth: June 10, 1946   Medicare Important Message Given:  Yes     Caren Macadam 01/12/2021, 12:37 PM

## 2021-01-12 NOTE — Progress Notes (Signed)
ANTICOAGULATION CONSULT NOTE - Follow Up Consult  Pharmacy Consult for Heparin >> Xarelto Indication: DVT/PE, concurrent CVA  Patient Measurements: Height: _0  (165.1 cm) Weight: 72.5 kg (159 lb 13.3 oz) IBW/kg (Calculated) : 57 Heparin Dosing Weight: 71.6 kg   Medications:  Infusions:  . sodium chloride      Assessment: 39 yoF with severe autoimmune hemolytic anemiaofunknown etiology.  Hgb 5.3 on admit, down as low ast 4.3 - transfused 4/16 and again 4/19.  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  Pharmacy is consulted to dose IV heparin.  01/12/2021 10:01 AM   Heparin drip stopped at 07:25 for bone marrow biopsy this morning  CBC stable   No bleeding noted  Noted that patient having mild to moderate nosebleeds off and on with heparin drip (levels had been > goal)  Goal of Therapy:  Therapeutic anticoagularion Monitor platelets by anticoagulation protocol: Yes   Plan:  Resume anticoagulation at 11:00 per IR orders Begin Xarelto 57m PO bid x 21 days, then 278mwith supper thereafter Will provide 30d free coupon voucher prior to discharge Monitor closely for signs/symptoms of bleeding  ErPeggyann JubaPharmD, BCPS Pharmacy: [phone]/25/2022, 10:01 AM

## 2021-01-12 NOTE — Procedures (Signed)
Interventional Radiology Procedure Note  Procedure: CT BM asp and core bx    Complications: None  Estimated Blood Loss:  min  Findings: 11 g core and asp    M. TREVOR Dalena Plantz, MD    

## 2021-01-12 NOTE — Progress Notes (Signed)
It looks like we are starting to see the results of the prednisone and the IVIG.  Yesterday, [name]'s hemoglobin was up to 9.3.  The reticulocyte count was 16%.  We do not have today's labs back yet.  Had to believe that it would be similar or better with respect to her hemoglobin.  She goes for her bone marrow test today.  She is on heparin.  I would switch her over to Xarelto.  I think this will make a good option for her.  I would like to think that she might be able to go home today if the Xarelto is covered as an outpatient.  Her hemoglobin is 14.1.  This is up because of the prednisone.  We will decrease her prednisone dose down to 60 mg a day.  She must go home on the Famvir and the Diflucan.  Her blood sugars are doing quite nicely.  Her bilirubin is 1.8.  The bone marrow report will really be interesting.  We will see if there is some underlying lymphoproliferative process that might be causing this autoimmune hemolytic anemia.  I will plan to get immunoglobulins on her.  I think this would be helpful.  Her vital signs show temperature of 98.2.  Pulse 68.  Blood pressure 122/72.  Her oral exam shows no thrush.  She has no adenopathy in the neck.  Lungs are clear bilaterally.  Cardiac exam regular rate and rhythm.  Abdomen is soft.  She has good bowel sounds.  There is no liver or spleen tip.  Extremities she has good movement of her upper and lower extremities.  Neurological exam is not focal.  [name] has the autoimmune hemolytic anemia.  This seems to be improving now.  Her reticulocyte count shows as this.  Her bilirubin is no longer elevated.  She still has a elevated LDH.  This might be reflective of what ever might be going on in the bone marrow.  She has the DVT.  Hypercoagulable studies are still pending for the most part.  We will switch her over to oral therapy.  Again, it would be nice if she could go home today.  She may have to go home tomorrow depending on when  the Xarelto can be approved as an outpatient.  I know that she is getting outstanding care from everybody up on [address].  Christin Bach, MD  Acts 4:12.

## 2021-01-12 NOTE — Progress Notes (Signed)
Chaplain made follow up visit to check on pt after this morning's procedure to learn pt is going home.  Met pt's husband about whom she has spoken lovingly.  Pt is excited and upbeat.  Especially thankful for Dr. Dwyane Dee.  [name]

## 2021-01-12 NOTE — Discharge Summary (Signed)
Physician Discharge Summary  [name] BTC:[id number] DOB: [birth year] DOA: 01/02/2021  PCP: Lorene Dy, MD  Admit date: 01/02/2021 Discharge date: 01/12/2021  Admitted From: Home Disposition:  Home  Recommendations for Outpatient Follow-up:  1. Follow up with PCP in 1 week 2. Follow up with Dr. Marin Olp, hematology oncology 3. Follow-up with Dr. Lavell Anchors, neurology  Discharge Condition: Stable CODE STATUS: Full  Diet recommendation:  Diet Orders (From admission, onward)    Start     Ordered   01/12/21 0923  Diet regular Room service appropriate? Yes; Fluid consistency: Thin  Diet effective now       Question Answer Comment  Room service appropriate? Yes   Fluid consistency: Thin      01/12/21 1121         Brief/Interim Summary: [name] is a 75 years old female with PMH significant for hypothyroidism and polyneuropathy who presented in the ED with generalized weakness.  Upon ED evaluation she was found to be anemic with a hemoglobin of 5.7 down from 13.0 which is her baseline. She was also found to have elevated bilirubin mostly indirect, elevated LDH and elevated D-dimer.  CT abdomen and pelvis was normal. Stool for occult blood negative. 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  Hematology was consulted,  Patient started on prednisone, has received IVIG. She has developed stroke during this hospitalization, Neurology was consulted.  Patient is started on heparin gtt for confirmed DVT and suspected PE on echocardiogram.  She underwent bone marrow biopsy on 4/25.   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 Stool for occult blood negative, found to have elevated LDH, elevated  Indirect bilirubin. 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 Continue prednisone, folic acid, IVIG given on 01/05/2021  Mild transfusion reaction on 4/16 -blood transfusion stopped immediately without any prolonged issues or incidents Blood transfusion completed on 4/19 without overt incident Patient remains high risk for transfusion reaction, will attempt to pretreat with antihistamine per protocol  Underwent bone marrow biopsy 4/25  Acute ischemicvs embolicstroke in the setting of above: On 01/05/2021 while in route to CT scanner for bone marrow biopsy,  patient had mental status changes with slurred speech and confusion. Code stroke was called, discussed with teleneurology, MRI shows - "Acute infarcts of the right parietotemporal lobes greater than left frontal lobe. Additional punctate left parietal and right occipital acute infarcts" Neurology consulted given atypical appearance, concern for embolic disease, suggest transfer to [facility] main hospital for further evaluation with neurology, May require TEE pending cultures versus other findings.  Echo: LVEF 60-65%, no regional wall motion abnormalities. McConnell's sign appears to be present. May consider TCD bubble study to rule out PFO as outpt with neuro follow up as it will not change management now Outpatient follow-up with Dr. Jaynee Eagles in 3 to 4 weeks. IV heparin --> Xarelto   DVT and suspected PE DVT confirmed on BLE US Echo consistent with PE IV heparin --> Xarelto   Blood culture contaminant  Patient does not meet sepsis criteria. Staph epi 1/2 preliminary - hold abx and follow repeat cultures Echo no vegetation,  may require TEE  to rule out vegetations Repeat blood cultures: No growth   Hypothyroidism Continue Synthroid   Anxiety Continue Zoloft   Polyneuropathy Continue gabapentin   Discharge  Instructions  Discharge Instructions    Ambulatory referral to Neurology   Complete by: As directed    Follow up with Dr. Jaynee Eagles at [facility] in 4 weeks. PT is Dr. Cathren Laine pt. Thanks.   Call MD for:  difficulty breathing, headache or visual disturbances   Complete by: As directed    Call MD for:  extreme fatigue   Complete by: As directed    Call MD for:  persistant dizziness or light-headedness   Complete by: As directed    Call MD for:  persistant nausea and vomiting   Complete by: As directed    Call MD for:  redness, tenderness, or signs of infection (pain, swelling, redness, odor or green/yellow discharge around incision site)   Complete by: As directed    Call MD for:  severe uncontrolled pain   Complete by: As directed    Call MD for:  temperature >100.4   Complete by: As directed    Discharge instructions   Complete by: As directed    You were cared for by a hospitalist during your hospital stay. If you have any questions about your discharge medications or the care you received while you were in the hospital after you are discharged, you can call the unit and ask to speak with the hospitalist on call if the hospitalist that took care of you is not available. Once you are discharged, your primary care physician will handle any further medical issues. Please note that NO REFILLS for any discharge medications will be authorized once you are discharged, as it is imperative that you return to your primary care physician (or establish a relationship with a primary care physician if you do not have one) for your aftercare needs so that they can reassess your need for medications and monitor your lab values.   Increase activity slowly   Complete by: As directed    No wound care   Complete by: As directed      Allergies as of 01/12/2021      Reactions   Aspirin    Avoid due to stomach issues   Cephalexin    Stomach cramps   Imipenem    Warm and tingly all over   Losartan Potassium-hctz     Other reaction(s): do not remember   Oxycodone    Hallucinations    Statins    leg pain, muscle cramps   Clindamycin/lincomycin Nausea And Vomiting, Rash   Contrast Media [iodinated Diagnostic Agents] Rash   Penicillins Rash   Did it involve swelling of the face/tongue/throat, SOB, or low BP? No Did it involve sudden or severe rash/hives, skin peeling, or any reaction on the inside of your mouth or nose? Yes Did you need to seek medical attention at a hospital or doctor's office? No When did it last happen?2011 If all above answers are "NO", may proceed with cephalosporin use.   Quinolones Rash      Medication List    STOP taking these medications   HYDROcodone-acetaminophen 5-325 MG tablet Commonly known as: NORCO/VICODIN     TAKE these medications   acetaminophen 500 MG tablet Commonly known as: TYLENOL Take 1,000 mg by mouth every 6 (six) hours as needed for headache.   Calcium Carbonate+Vitamin D 600-200 MG-UNIT Tabs Take 2 tablets by mouth 2 (two) times daily.  famciclovir 500 MG tablet Commonly known as: FAMVIR Take 1 tablet (500 mg total) by mouth daily. Start taking on: January 13, 2021   fluconazole 100 MG tablet Commonly known as: DIFLUCAN Take 1 tablet (100 mg total) by mouth daily. Start taking on: January 13, 2021   gabapentin 300 MG capsule Commonly known as: NEURONTIN Take 359m(1 cap) in the morning, 3074m1 cap) in the afternoon and 60076m cap) before bed What changed:   how much to take  how to take this  when to take this  additional instructions   ICY HOT EX Apply 1 application topically as needed (back pain).   levothyroxine 112 MCG tablet Commonly known as: SYNTHROID Take 112 mcg by mouth daily before breakfast.   LORazepam 2 MG tablet Commonly known as: ATIVAN Take 2 mg by mouth 3 (three) times daily as needed for anxiety.   multivitamin with minerals tablet Take 1 tablet by mouth daily.   pantoprazole 40 MG  tablet Commonly known as: PROTONIX Take 1 tablet (40 mg total) by mouth 2 (two) times daily.   polycarbophil 625 MG tablet Commonly known as: FIBERCON Take 1,250 mg by mouth daily.   predniSONE 20 MG tablet Commonly known as: DELTASONE Take 3 tablets (60 mg total) by mouth daily with breakfast. Start taking on: January 13, 2021   Rivaroxaban Stater Pack (15 mg and 20 mg) Commonly known as: XARDeerwoodCK Follow package directions: Take one 67m28mblet by mouth twice a day. On day 22, switch to one 20mg64mlet once a day. Take with food.   sertraline 50 MG tablet Commonly known as: ZOLOFT Take 50 mg by mouth daily.   sucralfate 1 g tablet Commonly known as: CARAFATE Take 1 g by mouth 2 (two) times daily.       Follow-up Information    AhernMelvenia Beam Schedule an appointment as soon as possible for a visit in 4 week(s).   Specialty: Neurology Contact information: [address] [place] [phone]        RoberLorene Dy Schedule an appointment as soon as possible for a visit in 1 week(s).   Specialty: Internal Medicine Contact information: [phone]        EnnevVolanda Napoleon Schedule an appointment as soon as possible for a visit in 1 week(s).   Specialty: Oncology Contact information: [phone]              Allergies  Allergen Reactions  . Aspirin     Avoid due to stomach issues  . Cephalexin     Stomach cramps  . Imipenem     Warm and tingly all over  . Losartan Potassium-Hctz     Other reaction(s): do not remember  . Oxycodone     Hallucinations   . Statins     leg pain, muscle cramps  . Clindamycin/Lincomycin Nausea And Vomiting and Rash  . Contrast Media [Iodinated Diagnostic Agents] Rash  . Penicillins Rash    Did it involve swelling of the face/tongue/throat, SOB, or low BP? No Did it involve  sudden or severe rash/hives, skin peeling, or any reaction on the inside of your mouth or nose? Yes Did you need to seek medical attention at a hospital or doctor's office? No When did it last happen?2011 If all above answers are "NO", may proceed with cephalosporin use.   . Quinolones  Rash    Consultations:  Hematology/Oncology  Neurology  IR    Procedures/Studies: CT Abdomen Pelvis Wo Contrast  Result Date: 01/02/2021 CLINICAL DATA:  Acute onset abdominal and back pain 4 days ago. EXAM: CT ABDOMEN AND PELVIS WITHOUT CONTRAST TECHNIQUE: Multidetector CT imaging of the abdomen and pelvis was performed following the standard protocol without IV contrast. COMPARISON:  CT abdomen and pelvis 09/16/2020. FINDINGS: Lower chest: Mild dependent atelectasis. Lung bases otherwise clear. No pleural or pericardial effusion. Hepatobiliary: No focal liver abnormality is seen. No gallstones, gallbladder wall thickening, or biliary dilatation. Pancreas: Unremarkable. No pancreatic ductal dilatation or surrounding inflammatory changes. Spleen: Normal in size without focal abnormality. Adrenals/Urinary Tract: The adrenal glands appear normal. No renal or ureteral stones or hydronephrosis. Small cyst in the right kidney is noted but better seen on the patient's prior CT without IV contrast. Urinary bladder appears normal. Stomach/Bowel: Stomach is within normal limits. Negative for appendicitis. Small appendicolith noted. No evidence of bowel wall thickening, distention, or inflammatory changes. Vascular/Lymphatic: Aortic atherosclerosis. No enlarged abdominal or pelvic lymph nodes. Reproductive: Uterus and bilateral adnexa are unremarkable. Other: None. Musculoskeletal: No acute abnormality. 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 IMPRESSION: Negative for urinary tract stone. No acute abnormality or finding to explain the patient's symptoms. Aortic Atherosclerosis  (ICD10-I70.0). Electronically Signed   By: Inge Rise M.D.   On: 01/02/2021 13:25   MR ANGIO HEAD WO CONTRAST  Result Date: 01/05/2021 CLINICAL DATA:  Stroke, follow-up. EXAM: MRA HEAD WITHOUT CONTRAST TECHNIQUE: Angiographic images of the Circle of Willis were obtained using MRA technique without intravenous contrast. COMPARISON:  Brain MRI 01/05/2021. FINDINGS: The intracranial internal carotid arteries are patent. The M1 middle cerebral arteries are patent. No M2 proximal branch occlusion or high-grade proximal stenosis is identified. The anterior cerebral arteries are patent. The intracranial vertebral arteries are patent. The basilar artery is patent. The posterior cerebral arteries are patent. Posterior communicating arteries are present bilaterally. No intracranial aneurysm is identified. IMPRESSION: No intracranial large vessel occlusion or proximal high-grade arterial stenosis. Electronically Signed   By: Kellie Simmering DO   On: 01/05/2021 17:53   MR BRAIN WO CONTRAST  Result Date: 01/05/2021 CLINICAL DATA:  Mental status change, possible stroke EXAM: MRI HEAD WITHOUT CONTRAST TECHNIQUE: Multiplanar, multiecho pulse sequences of the brain and surrounding structures were obtained without intravenous contrast. COMPARISON:  None. FINDINGS: Brain: There is cortical/subcortical restricted diffusion in the right parietal lobe extending into the posterior temporal lobe. Small area of involvement is present in the left frontal lobe near the operculum. Punctate foci are also present within the left parietal lobe and right occipital lobe. There is no intracranial mass or significant mass effect. There is no hydrocephalus or extra-axial fluid collection. Ventricles and sulci are normal in size and configuration. Patchy foci of T2 hyperintensity in the supratentorial white matter are nonspecific but may reflect mild chronic microvascular ischemic changes. A few scattered foci of subcortical susceptibility  hypointensity likely reflect chronic microhemorrhages Vascular: Major vessel flow voids at the skull base are preserved. Skull and upper cervical spine: Decreased T1 marrow signal likely related to known anemia. Sinuses/Orbits: Mild mucosal thickening. Bilateral lens replacements. Other: Sella is unremarkable.  Mastoid air cells are clear. IMPRESSION: Acute infarcts of the right parietotemporal lobes greater than left frontal lobe. Additional punctate left parietal and right occipital acute infarcts. Mild chronic microvascular ischemic changes. Few chronic microhemorrhages. These results will be called to the ordering clinician or representative by the  Psychologist, clinical, and communication documented in the PACS or [employer]. Electronically Signed   By: Macy Mis M.D.   On: 01/05/2021 11:26   CT BIOPSY  Result Date: 01/12/2021 INDICATION: Severe autoimmune hemolytic anemia EXAM: CT GUIDED RIGHT ILIAC BONE MARROW ASPIRATION AND CORE BIOPSY Date:  01/12/2021 01/12/2021 8:59 am Radiologist:  M. Daryll Brod, MD Guidance:  CT FLUOROSCOPY TIME:  Fluoroscopy Time: None. MEDICATIONS: 1% lidocaine local ANESTHESIA/SEDATION: 2.0 mg IV Versed; 100 mcg IV Fentanyl Moderate Sedation Time:  10 minutes The patient was continuously monitored during the procedure by the interventional radiology nurse under my direct supervision. CONTRAST:  None. COMPLICATIONS: None PROCEDURE: Informed consent was obtained from the patient following explanation of the procedure, risks, benefits and alternatives. The patient understands, agrees and consents for the procedure. All questions were addressed. A time out was performed. The patient was positioned prone and non-contrast localization CT was performed of the pelvis to demonstrate the iliac marrow spaces. Maximal barrier sterile technique utilized including caps, mask, sterile gowns, sterile gloves, large sterile drape, hand hygiene, and Betadine prep. Under sterile conditions  and local anesthesia, an 11 gauge coaxial bone biopsy needle was advanced into the right iliac marrow space. Needle position was confirmed with CT imaging. Initially, bone marrow aspiration was performed. Next, the 11 gauge outer cannula was utilized to obtain a right iliac bone marrow core biopsy. Needle was removed. Hemostasis was obtained with compression. The patient tolerated the procedure well. Samples were prepared with the cytotechnologist. No immediate complications. IMPRESSION: CT guided right iliac bone marrow aspiration and core biopsy. Electronically Signed   By: Jerilynn Mages.  Shick M.D.   On: 01/12/2021 09:07   DG Chest Port 1 View  Result Date: 01/02/2021 CLINICAL DATA:  Shortness of breath and abdominal pain. EXAM: PORTABLE CHEST 1 VIEW COMPARISON:  04/18/2019 FINDINGS: Lungs are adequately inflated and otherwise clear. Cardiomediastinal silhouette and remainder of the exam is unchanged. IMPRESSION: No active disease. Electronically Signed   By: Marin Olp M.D.   On: 01/02/2021 12:30   CT BONE MARROW BIOPSY & ASPIRATION  Result Date: 01/12/2021 INDICATION: Severe autoimmune hemolytic anemia EXAM: CT GUIDED RIGHT ILIAC BONE MARROW ASPIRATION AND CORE BIOPSY Date:  01/12/2021 01/12/2021 8:59 am Radiologist:  M. Daryll Brod, MD Guidance:  CT FLUOROSCOPY TIME:  Fluoroscopy Time: None. MEDICATIONS: 1% lidocaine local ANESTHESIA/SEDATION: 2.0 mg IV Versed; 100 mcg IV Fentanyl Moderate Sedation Time:  10 minutes The patient was continuously monitored during the procedure by the interventional radiology nurse under my direct supervision. CONTRAST:  None. COMPLICATIONS: None PROCEDURE: Informed consent was obtained from the patient following explanation of the procedure, risks, benefits and alternatives. The patient understands, agrees and consents for the procedure. All questions were addressed. A time out was performed. The patient was positioned prone and non-contrast localization CT was performed of the  pelvis to demonstrate the iliac marrow spaces. Maximal barrier sterile technique utilized including caps, mask, sterile gowns, sterile gloves, large sterile drape, hand hygiene, and Betadine prep. Under sterile conditions and local anesthesia, an 11 gauge coaxial bone biopsy needle was advanced into the right iliac marrow space. Needle position was confirmed with CT imaging. Initially, bone marrow aspiration was performed. Next, the 11 gauge outer cannula was utilized to obtain a right iliac bone marrow core biopsy. Needle was removed. Hemostasis was obtained with compression. The patient tolerated the procedure well. Samples were prepared with the cytotechnologist. No immediate complications. IMPRESSION: CT guided right iliac bone marrow aspiration and core biopsy.  Electronically Signed   By: Jerilynn Mages.  Shick M.D.   On: 01/12/2021 09:07   ECHOCARDIOGRAM COMPLETE  Result Date: 01/06/2021    ECHOCARDIOGRAM REPORT   Patient Name:   DIANNIA HOGENSON Date of Exam: 01/06/2021 Medical Rec #:  [id number]      Height:       65.0 in Accession #:    [id number]     Weight:       160.0 lb Date of Birth:  1946-07-03      BSA:          1.799 m Patient Age:    80 years       BP:           95/59 mmHg Patient Gender: F              HR:           73 bpm. Exam Location:  Inpatient Procedure: 2D Echo, Color Doppler and Cardiac Doppler Indications:    Stroke I63.9  History:        Patient has prior history of Echocardiogram examinations, most                 recent 07/31/2013. Signs/Symptoms:Dyspnea; Risk                 Factors:Diabetes.  Sonographer:    Bernadene Person RDCS Referring Phys: Rudell Cobb ENNEVER IMPRESSIONS  1. Left ventricular ejection fraction, by estimation, is 60 to 65%. The left ventricle has normal function. The left ventricle has no regional wall motion abnormalities. Left ventricular diastolic parameters are consistent with Grade I diastolic dysfunction (impaired relaxation).  2. McConnell's sign appears to be present..  Right ventricular systolic function is mildly reduced. The right ventricular size is moderately enlarged. There is moderately elevated pulmonary artery systolic pressure.  3. Right atrial size was moderately dilated.  4. The mitral valve is normal in structure. Mild mitral valve regurgitation. No evidence of mitral stenosis.  5. Tricuspid valve regurgitation is moderate.  6. The aortic valve is tricuspid. Aortic valve regurgitation is not visualized. No aortic stenosis is present.  7. The inferior vena cava is dilated in size with <50% respiratory variability, suggesting right atrial pressure of 15 mmHg. Comparison(s): Prior images unable to be directly viewed, comparison made by report only. Findings suggest cor pulmonale, possibly acute. Consider pulmonary embolism. Discussed with primary team. FINDINGS  Left Ventricle: Left ventricular ejection fraction, by estimation, is 60 to 65%. The left ventricle has normal function. The left ventricle has no regional wall motion abnormalities. The left ventricular internal cavity size was normal in size. There is  no left ventricular hypertrophy. Left ventricular diastolic parameters are consistent with Grade I diastolic dysfunction (impaired relaxation). Normal left ventricular filling pressure. Right Ventricle: McConnell's sign appears to be present. The right ventricular size is moderately enlarged. No increase in right ventricular wall thickness. Right ventricular systolic function is mildly reduced. There is moderately elevated pulmonary artery systolic pressure. The tricuspid regurgitant velocity is 3.30 m/s, and with an assumed right atrial pressure of 15 mmHg, the estimated right ventricular systolic pressure is 03.0 mmHg. Left Atrium: Left atrial size was normal in size. Right Atrium: Right atrial size was moderately dilated. Prominent Eustachian valve. Pericardium: There is no evidence of pericardial effusion. Mitral Valve: The mitral valve is normal in structure.  Mild mitral valve regurgitation, with centrally-directed jet. No evidence of mitral valve stenosis. Tricuspid Valve: The tricuspid valve is normal in structure. Tricuspid valve regurgitation  is moderate . No evidence of tricuspid stenosis. Aortic Valve: The aortic valve is tricuspid. Aortic valve regurgitation is not visualized. No aortic stenosis is present. Pulmonic Valve: The pulmonic valve was normal in structure. Pulmonic valve regurgitation is mild. No evidence of pulmonic stenosis. Aorta: The aortic root is normal in size and structure. Venous: The inferior vena cava is dilated in size with less than 50% respiratory variability, suggesting right atrial pressure of 15 mmHg. IAS/Shunts: No atrial level shunt detected by color flow Doppler.  LEFT VENTRICLE PLAX 2D LVIDd:         4.30 cm  Diastology LVIDs:         2.70 cm  LV e' medial:    7.27 cm/s LV PW:         0.80 cm  LV E/e' medial:  8.8 LV IVS:        0.80 cm  LV e' lateral:   12.90 cm/s LVOT diam:     1.80 cm  LV E/e' lateral: 5.0 LV SV:         55 LV SV Index:   30 LVOT Area:     2.54 cm  RIGHT VENTRICLE TAPSE (M-mode): 1.9 cm LEFT ATRIUM             Index       RIGHT ATRIUM           Index LA diam:        2.60 cm 1.45 cm/m  RA Area:     25.40 cm LA Vol (A2C):   37.8 ml 21.01 ml/m RA Volume:   96.80 ml  53.80 ml/m LA Vol (A4C):   30.6 ml 17.01 ml/m LA Biplane Vol: 35.6 ml 19.79 ml/m  AORTIC VALVE LVOT Vmax:   105.00 cm/s LVOT Vmean:  70.800 cm/s LVOT VTI:    0.215 m  AORTA Ao Root diam: 3.30 cm Ao Asc diam:  3.60 cm MITRAL VALVE               TRICUSPID VALVE MV Area (PHT): 2.29 cm    TR Peak grad:   43.6 mmHg MV Decel Time: 331 msec    TR Vmax:        330.00 cm/s MV E velocity: 64.30 cm/s MV A velocity: 81.40 cm/s  SHUNTS MV E/A ratio:  0.79        Systemic VTI:  0.22 m                            Systemic Diam: 1.80 cm Dani Gobble Croitoru MD Electronically signed by Sanda Klein MD Signature Date/Time: 01/06/2021/4:50:13 PM    Final    VAS US  CAROTID  Result Date: 01/06/2021 Carotid Arterial Duplex Study Indications:       CVA and Aphasia & Dysarthria. Risk Factors:      Past history of smoking. Other Factors:     Autoimmune hemolytic anemia. Comparison Study:  No previous exams Performing Technologist: Rogelia Rohrer  Examination Guidelines: A complete evaluation includes B-mode imaging, spectral Doppler, color Doppler, and power Doppler as needed of all accessible portions of each vessel. Bilateral testing is considered an integral part of a complete examination. Limited examinations for reoccurring indications may be performed as noted.  Right Carotid Findings: +----------+--------+--------+--------+------------------+------------------+           PSV cm/sEDV cm/sStenosisPlaque DescriptionComments           +----------+--------+--------+--------+------------------+------------------+ CCA Prox  76  17              heterogenous      intimal thickening +----------+--------+--------+--------+------------------+------------------+ CCA Distal76      27                                intimal thickening +----------+--------+--------+--------+------------------+------------------+ ICA Prox  104     35                                intimal thickening +----------+--------+--------+--------+------------------+------------------+ ICA Distal100     26                                                   +----------+--------+--------+--------+------------------+------------------+ ECA       62      7                                                    +----------+--------+--------+--------+------------------+------------------+ +----------+--------+-------+----------------+-------------------+           PSV cm/sEDV cmsDescribe        Arm Pressure (mmHG) +----------+--------+-------+----------------+-------------------+ [id number]             Multiphasic, WNL                     +----------+--------+-------+----------------+-------------------+ +---------+--------+--+--------+--+---------+ VertebralPSV cm/s46EDV cm/s16Antegrade +---------+--------+--+--------+--+---------+  Left Carotid Findings: +----------+--------+--------+--------+------------------+------------------+           PSV cm/sEDV cm/sStenosisPlaque DescriptionComments           +----------+--------+--------+--------+------------------+------------------+ CCA Prox  69      19                                intimal thickening +----------+--------+--------+--------+------------------+------------------+ CCA Distal65      18                                intimal thickening +----------+--------+--------+--------+------------------+------------------+ ICA Prox  80      25                                                   +----------+--------+--------+--------+------------------+------------------+ ICA Distal107     36                                                   +----------+--------+--------+--------+------------------+------------------+ ECA       66      16                                                   +----------+--------+--------+--------+------------------+------------------+ +----------+--------+--------+----------------+-------------------+  PSV cm/sEDV cm/sDescribe        Arm Pressure (mmHG) +----------+--------+--------+----------------+-------------------+ [id number]             Multiphasic, WNL                    +----------+--------+--------+----------------+-------------------+ +---------+--------+--+--------+--+---------+ VertebralPSV cm/s62EDV cm/s25Antegrade +---------+--------+--+--------+--+---------+   Summary: Right Carotid: The extracranial vessels were near-normal with only minimal wall                thickening or plaque. Left Carotid: The extracranial vessels were near-normal with only minimal wall                thickening or plaque. Vertebrals:  Bilateral vertebral arteries demonstrate antegrade flow. Subclavians: Normal flow hemodynamics were seen in bilateral subclavian              arteries. *See table(s) above for measurements and observations.  Electronically signed by Curt Jews MD on 01/06/2021 at 5:10:01 PM.    Final    VAS [place] LOWER EXTREMITY VENOUS (DVT)  Result Date: 01/06/2021  Lower Venous DVT Study Indications: Stroke.  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 Comparison Study: No previous exams Performing Technologist: Rogelia Rohrer  Examination Guidelines: A complete evaluation includes B-mode imaging, spectral Doppler, color Doppler, and power Doppler as needed of all accessible portions of each vessel. Bilateral testing is considered an integral part of a complete examination. Limited examinations for reoccurring indications may be performed as noted. The reflux portion of the exam is performed with the patient in reverse Trendelenburg.  +---------+---------------+---------+-----------+----------+-------------------+ RIGHT    CompressibilityPhasicitySpontaneityPropertiesThrombus Aging      +---------+---------------+---------+-----------+----------+-------------------+ CFV      Full           Yes      Yes                                      +---------+---------------+---------+-----------+----------+-------------------+ SFJ      Full                                                             +---------+---------------+---------+-----------+----------+-------------------+ FV Prox  Full           Yes      Yes                                      +---------+---------------+---------+-----------+----------+-------------------+ FV Mid   Full           Yes      Yes                                      +---------+---------------+---------+-----------+----------+-------------------+ FV DistalFull           Yes      Yes                                       +---------+---------------+---------+-----------+----------+-------------------+ PFV      Full                                                             +---------+---------------+---------+-----------+----------+-------------------+  POP      Full           Yes      Yes                                      +---------+---------------+---------+-----------+----------+-------------------+ PTV      Full                                                             +---------+---------------+---------+-----------+----------+-------------------+ PERO     None           No       No                   Acute DVT of one of                                                       paired peroneal                                                           veins               +---------+---------------+---------+-----------+----------+-------------------+   +---------+---------------+---------+-----------+----------+-----------------+ LEFT     CompressibilityPhasicitySpontaneityPropertiesThrombus Aging    +---------+---------------+---------+-----------+----------+-----------------+ CFV      Full           Yes      Yes                                    +---------+---------------+---------+-----------+----------+-----------------+ SFJ      Full                                                           +---------+---------------+---------+-----------+----------+-----------------+ FV Prox  Full           Yes      Yes                                    +---------+---------------+---------+-----------+----------+-----------------+ FV Mid   Full           Yes      Yes                                    +---------+---------------+---------+-----------+----------+-----------------+ FV DistalFull           Yes      Yes                                    +---------+---------------+---------+-----------+----------+-----------------+  PFV      Full                                                            +---------+---------------+---------+-----------+----------+-----------------+ POP      Partial        No       No                   Age Indeterminate +---------+---------------+---------+-----------+----------+-----------------+ PTV      Full                                                           +---------+---------------+---------+-----------+----------+-----------------+ PERO     Full                                                           +---------+---------------+---------+-----------+----------+-----------------+     Summary: BILATERAL: -No evidence of popliteal cyst, bilaterally. RIGHT: - Findings consistent with acute deep vein thrombosis involving the right peroneal veins.  LEFT: - Findings consistent with age indeterminate deep vein thrombosis involving the left popliteal vein.  *See table(s) above for measurements and observations. Electronically signed by Curt Jews MD on 01/06/2021 at 5:09:40 PM.    Final        Discharge Exam: Vitals:   01/12/21 0830 01/12/21 1006  BP: 121/69 117/66  Pulse: 71 (!) 55  Resp: 15 16  Temp:  98.2 F (36.8 C)  SpO2: 100% 100%    General: Pt is alert, awake, not in acute distress Cardiovascular: Bradycardic rate 50s, regular, S1/S2 +, no edema Respiratory: CTA bilaterally, no wheezing, no rhonchi, no respiratory distress, no conversational dyspnea  Abdominal: Soft, NT, ND, bowel sounds + Extremities: no edema, no cyanosis Psych: Normal mood and affect, stable judgement and insight     The results of significant diagnostics from this hospitalization (including imaging, microbiology, ancillary and laboratory) are listed below for reference.     Microbiology: Recent Results (from the past 240 hour(s))  Resp Panel by RT-PCR (Flu A&B, Covid) Nasopharyngeal Swab     Status: None   Collection Time: 01/02/21 12:15 PM   Specimen: Nasopharyngeal Swab; Nasopharyngeal(NP) swabs in  vial transport medium  Result Value Ref Range Status   SARS Coronavirus 2 by RT PCR NEGATIVE NEGATIVE Final    Comment: (NOTE) SARS-CoV-2 target nucleic acids are NOT DETECTED.  The SARS-CoV-2 RNA is generally detectable in upper respiratory specimens during the acute phase of infection. The lowest concentration of SARS-CoV-2 viral copies this assay can detect is 138 copies/mL. A negative result does not preclude SARS-Cov-2 infection and should not be used as the sole basis for treatment or other patient management decisions. A negative result may occur with  improper specimen collection/handling, submission of specimen other than nasopharyngeal swab, presence of viral mutation(s) within the areas targeted by this assay, and inadequate number of viral copies(<138 copies/mL). A negative result must be combined with clinical observations,  patient history, and epidemiological information. The expected result is Negative.  Fact Sheet for Patients:  EntrepreneurPulse.com.au  Fact Sheet for Healthcare Providers:  IncredibleEmployment.be  This test is no t yet approved or cleared by the [place] FDA and  has been authorized for detection and/or diagnosis of SARS-CoV-2 by FDA under an Emergency Use Authorization (EUA). This EUA will remain  in effect (meaning this test can be used) for the duration of the COVID-19 declaration under Section 564(b)(1) of the Act, 21 U.S.C.section 360bbb-3(b)(1), unless the authorization is terminated  or revoked sooner.       Influenza A by PCR NEGATIVE NEGATIVE Final   Influenza B by PCR NEGATIVE NEGATIVE Final    Comment: (NOTE) The Xpert Xpress SARS-CoV-2/FLU/RSV plus assay is intended as an aid in the diagnosis of influenza from Nasopharyngeal swab specimens and should not be used as a sole basis for treatment. Nasal washings and aspirates are unacceptable for Xpert Xpress SARS-CoV-2/FLU/RSV testing.  Fact  Sheet for Patients: EntrepreneurPulse.com.au  Fact Sheet for Healthcare Providers: IncredibleEmployment.be  This test is not yet approved or cleared by the [place] FDA and has been authorized for detection and/or diagnosis of SARS-CoV-2 by FDA under an Emergency Use Authorization (EUA). This EUA will remain in effect (meaning this test can be used) for the duration of the COVID-19 declaration under Section 564(b)(1) of the Act, 21 U.S.C. section 360bbb-3(b)(1), unless the authorization is terminated or revoked.  Performed at [facility], [place] [address], [place], [place] [address]   MRSA PCR Screening     Status: None   Collection Time: 01/03/21  6:24 PM   Specimen: Nasal Mucosa; Nasopharyngeal  Result Value Ref Range Status   MRSA by PCR NEGATIVE NEGATIVE Final    Comment:        The GeneXpert MRSA Assay (FDA approved for NASAL specimens only), is one component of a comprehensive MRSA colonization surveillance program. It is not intended to diagnose MRSA infection nor to guide or monitor treatment for MRSA infections. Performed at [facility], [place] [address] [place], [place] [address]   Culture, blood (routine x 2)     Status: None   Collection Time: 01/05/21  3:18 PM   Specimen: BLOOD LEFT FOREARM  Result Value Ref Range Status   Specimen Description   Final    BLOOD LEFT FOREARM Performed at [facility] [place] [address] [place], [place] [address]    Special Requests   Final    BOTTLES DRAWN AEROBIC ONLY Blood Culture adequate volume Performed at [facility] [address] [place], [place] [address]    Culture   Final    NO GROWTH 5 DAYS Performed at [facility], [place] [address] [place], [place] [address]    Report Status 01/10/2021 FINAL  Final  Culture, blood (routine x 2)     Status: Abnormal   Collection Time: 01/05/21  3:18 PM    Specimen: BLOOD  Result Value Ref Range Status   Specimen Description   Final    BLOOD LEFT ANTECUBITAL Performed at [facility] [address] [place], [place] [address]    Special Requests   Final    BOTTLES DRAWN AEROBIC ONLY Blood Culture adequate volume Performed at [facility] [address], [place], [place] [address]    Culture  Setup Time   Final    GRAM POSITIVE COCCI AEROBIC BOTTLE ONLY CRITICAL RESULT CALLED  TO, READ BACK BY AND VERIFIED WITH: D,WOFFORD PHARMD _0  01/06/21 EB    Culture (A)  Final    STAPHYLOCOCCUS EPIDERMIDIS THE SIGNIFICANCE OF ISOLATING THIS ORGANISM FROM A SINGLE SET OF BLOOD CULTURES WHEN MULTIPLE SETS ARE DRAWN IS UNCERTAIN. PLEASE NOTIFY THE MICROBIOLOGY DEPARTMENT WITHIN ONE WEEK IF SPECIATION AND SENSITIVITIES ARE REQUIRED. Performed at [facility], [place] [address], [place], [place] [address]    Report Status 01/07/2021 FINAL  Final  Blood Culture ID Panel (Reflexed)     Status: Abnormal   Collection Time: 01/05/21  3:18 PM  Result Value Ref Range Status   Enterococcus faecalis NOT DETECTED NOT DETECTED Final   Enterococcus Faecium NOT DETECTED NOT DETECTED Final   Listeria monocytogenes NOT DETECTED NOT DETECTED Final   Staphylococcus species DETECTED (A) NOT DETECTED Final    Comment: CRITICAL RESULT CALLED TO, READ BACK BY AND VERIFIED WITH: D,WOFFORD PHARMD _1  01/06/21 EB    Staphylococcus aureus (BCID) NOT DETECTED NOT DETECTED Final   Staphylococcus epidermidis DETECTED (A) NOT DETECTED Final    Comment: Methicillin (oxacillin) resistant coagulase negative staphylococcus. Possible blood culture contaminant (unless isolated from more than one blood culture draw or clinical case suggests pathogenicity). No antibiotic treatment is indicated for blood  culture contaminants. CRITICAL RESULT CALLED TO, READ BACK BY AND VERIFIED WITH: D,WOFFORD PHARMD _2  01/06/21 EB    Staphylococcus  lugdunensis NOT DETECTED NOT DETECTED Final   Streptococcus species NOT DETECTED NOT DETECTED Final   Streptococcus agalactiae NOT DETECTED NOT DETECTED Final   Streptococcus pneumoniae NOT DETECTED NOT DETECTED Final   Streptococcus pyogenes NOT DETECTED NOT DETECTED Final   A.calcoaceticus-baumannii NOT DETECTED NOT DETECTED Final   Bacteroides fragilis NOT DETECTED NOT DETECTED Final   Enterobacterales NOT DETECTED NOT DETECTED Final   Enterobacter cloacae complex NOT DETECTED NOT DETECTED Final   Escherichia coli NOT DETECTED NOT DETECTED Final   Klebsiella aerogenes NOT DETECTED NOT DETECTED Final   Klebsiella oxytoca NOT DETECTED NOT DETECTED Final   Klebsiella pneumoniae NOT DETECTED NOT DETECTED Final   Proteus species NOT DETECTED NOT DETECTED Final   Salmonella species NOT DETECTED NOT DETECTED Final   Serratia marcescens NOT DETECTED NOT DETECTED Final   Haemophilus influenzae NOT DETECTED NOT DETECTED Final   Neisseria meningitidis NOT DETECTED NOT DETECTED Final   Pseudomonas aeruginosa NOT DETECTED NOT DETECTED Final   Stenotrophomonas maltophilia NOT DETECTED NOT DETECTED Final   Candida albicans NOT DETECTED NOT DETECTED Final   Candida auris NOT DETECTED NOT DETECTED Final   Candida glabrata NOT DETECTED NOT DETECTED Final   Candida krusei NOT DETECTED NOT DETECTED Final   Candida parapsilosis NOT DETECTED NOT DETECTED Final   Candida tropicalis NOT DETECTED NOT DETECTED Final   Cryptococcus neoformans/gattii NOT DETECTED NOT DETECTED Final   Methicillin resistance mecA/C DETECTED (A) NOT DETECTED Final    Comment: CRITICAL RESULT CALLED TO, READ BACK BY AND VERIFIED WITH: D,WOFFORD PHARMD _3  01/06/21 EB Performed at [facility], [address] [place], [place] [address]   Culture, blood (routine x 2)     Status: None   Collection Time: 01/06/21  4:05 PM   Specimen: BLOOD  Result Value Ref Range Status   Specimen Description   Final    BLOOD LEFT  ANTECUBITAL Performed at [facility] [address] [place],  [address]    Special Requests   Final    BOTTLES DRAWN AEROBIC ONLY Blood Culture adequate volume Performed at [facility]  [place] [address] [place], [place] [address]    Culture   Final    NO GROWTH 5 DAYS Performed at [facility], [place] [address], [place], [place] [address]    Report Status 01/11/2021 FINAL  Final  Culture, blood (routine x 2)     Status: Abnormal   Collection Time: 01/06/21  4:05 PM   Specimen: BLOOD LEFT HAND  Result Value Ref Range Status   Specimen Description   Final    BLOOD LEFT HAND Performed at [facility] [address], [place], [place] [address]    Special Requests   Final    BOTTLES DRAWN AEROBIC ONLY Blood Culture results may not be optimal due to an inadequate volume of blood received in culture bottles Performed at [facility] [address], [place], [place] [address]    Culture  Setup Time   Final    AEROBIC BOTTLE ONLY GRAM POSITIVE COCCI CRITICAL VALUE NOTED.  VALUE IS CONSISTENT WITH PREVIOUSLY REPORTED AND CALLED VALUE.    Culture (A)  Final    STAPHYLOCOCCUS HOMINIS THE SIGNIFICANCE OF ISOLATING THIS ORGANISM FROM A SINGLE SET OF BLOOD CULTURES WHEN MULTIPLE SETS ARE DRAWN IS UNCERTAIN. PLEASE NOTIFY THE MICROBIOLOGY DEPARTMENT WITHIN ONE WEEK IF SPECIATION AND SENSITIVITIES ARE REQUIRED. Performed at [facility], [place] [address] [place], [place] [address]    Report Status 01/09/2021 FINAL  Final     Labs: BNP (last 3 results) No results for input(s): BNP in the last 8760 hours. Basic Metabolic Panel: Recent Labs  Lab 01/07/21 0237 01/09/21 0425 01/10/21 0439 01/11/21 0625 01/12/21 0516  NA 137 138 137 134* 138  K 4.2 4.2 4.3 4.3 4.6  CL 108 106 103 102 105  CO2 21* _0 GLUCOSE 138* 98 108* 112* 115*  BUN _1 CREATININE 0.64 0.70 0.65 0.66  0.58  CALCIUM 8.6* 8.8* 9.4 9.3 9.5   Liver Function Tests: Recent Labs  Lab 01/07/21 0237 01/09/21 0425 01/10/21 0439 01/11/21 0625 01/12/21 0516  AST 41 39 29 29 32  ALT 34 _2 ALKPHOS 46 42 40 47 45  BILITOT 1.3* 1.9* 1.9* 1.9* 1.8*  PROT 8.5* 7.2 7.5 8.0 8.0  ALBUMIN 3.3* 2.9* 2.9* 3.2* 3.3*   No results for input(s): LIPASE, AMYLASE in the last 168 hours. No results for input(s): AMMONIA in the last 168 hours. CBC: Recent Labs  Lab 01/09/21 0425 01/10/21 0439 01/11/21 0625 01/12/21 0511 01/12/21 0516  WBC 12.5* 12.8* 14.2* 14.3* 14.1*  NEUTROABS 9.1* 9.8* 10.9* 11.3*  --   HGB 7.7* 8.1* 9.3* 10.1* 9.9*  HCT 23.4* 24.9* 26.6* 27.9* 28.0*  MCV 119.4* 111.7* 112.2* 117.7* >110.0*  PLT 172 224 279 333 323   Cardiac Enzymes: No results for input(s): CKTOTAL, CKMB, CKMBINDEX, TROPONINI in the last 168 hours. BNP: Invalid input(s): POCBNP CBG: No results for input(s): GLUCAP in the last 168 hours. D-Dimer No results for input(s): DDIMER in the last 72 hours. Hgb A1c No results for input(s): HGBA1C in the last 72 hours. Lipid Profile No results for input(s): CHOL, HDL, LDLCALC, TRIG, CHOLHDL, LDLDIRECT in the last 72 hours. Thyroid function studies No results for input(s): TSH, T4TOTAL, T3FREE, THYROIDAB in the last 72 hours.  Invalid input(s): FREET3 Anemia work up Recent Labs    01/11/21 0625 01/12/21 0516  RETICCTPCT 15.9* 16.1*   Urinalysis  Component Value Date/Time   COLORURINE AMBER (A) 01/02/2021 1900   APPEARANCEUR HAZY (A) 01/02/2021 1900   LABSPEC 1.017 01/02/2021 1900   PHURINE 5.0 01/02/2021 1900   GLUCOSEU NEGATIVE 01/02/2021 1900   HGBUR NEGATIVE 01/02/2021 1900   BILIRUBINUR NEGATIVE 01/02/2021 1900   KETONESUR NEGATIVE 01/02/2021 1900   PROTEINUR NEGATIVE 01/02/2021 1900   UROBILINOGEN 0.2 06/28/2010 0430   NITRITE NEGATIVE 01/02/2021 1900   LEUKOCYTESUR LARGE (A) 01/02/2021 1900   Sepsis Labs Invalid input(s):  PROCALCITONIN,  WBC,  LACTICIDVEN Microbiology Recent Results (from the past 240 hour(s))  Resp Panel by RT-PCR (Flu A&B, Covid) Nasopharyngeal Swab     Status: None   Collection Time: 01/02/21 12:15 PM   Specimen: Nasopharyngeal Swab; Nasopharyngeal(NP) swabs in vial transport medium  Result Value Ref Range Status   SARS Coronavirus 2 by RT PCR NEGATIVE NEGATIVE Final    Comment: (NOTE) SARS-CoV-2 target nucleic acids are NOT DETECTED.  The SARS-CoV-2 RNA is generally detectable in upper respiratory specimens during the acute phase of infection. The lowest concentration of SARS-CoV-2 viral copies this assay can detect is 138 copies/mL. A negative result does not preclude SARS-Cov-2 infection and should not be used as the sole basis for treatment or other patient management decisions. A negative result may occur with  improper specimen collection/handling, submission of specimen other than nasopharyngeal swab, presence of viral mutation(s) within the areas targeted by this assay, and inadequate number of viral copies(<138 copies/mL). A negative result must be combined with clinical observations, patient history, and epidemiological information. The expected result is Negative.  Fact Sheet for Patients:  EntrepreneurPulse.com.au  Fact Sheet for Healthcare Providers:  IncredibleEmployment.be  This test is no t yet approved or cleared by the [place] FDA and  has been authorized for detection and/or diagnosis of SARS-CoV-2 by FDA under an Emergency Use Authorization (EUA). This EUA will remain  in effect (meaning this test can be used) for the duration of the COVID-19 declaration under Section 564(b)(1) of the Act, 21 U.S.C.section 360bbb-3(b)(1), unless the authorization is terminated  or revoked sooner.       Influenza A by PCR NEGATIVE NEGATIVE Final   Influenza B by PCR NEGATIVE NEGATIVE Final    Comment: (NOTE) The Xpert Xpress  SARS-CoV-2/FLU/RSV plus assay is intended as an aid in the diagnosis of influenza from Nasopharyngeal swab specimens and should not be used as a sole basis for treatment. Nasal washings and aspirates are unacceptable for Xpert Xpress SARS-CoV-2/FLU/RSV testing.  Fact Sheet for Patients: EntrepreneurPulse.com.au  Fact Sheet for Healthcare Providers: IncredibleEmployment.be  This test is not yet approved or cleared by the [place] FDA and has been authorized for detection and/or diagnosis of SARS-CoV-2 by FDA under an Emergency Use Authorization (EUA). This EUA will remain in effect (meaning this test can be used) for the duration of the COVID-19 declaration under Section 564(b)(1) of the Act, 21 U.S.C. section 360bbb-3(b)(1), unless the authorization is terminated or revoked.  Performed at [facility], [place] [address] [place], [place] [address]   MRSA PCR Screening     Status: None   Collection Time: 01/03/21  6:24 PM   Specimen: Nasal Mucosa; Nasopharyngeal  Result Value Ref Range Status   MRSA by PCR NEGATIVE NEGATIVE Final    Comment:        The GeneXpert MRSA Assay (FDA approved for NASAL specimens only), is one component of a comprehensive MRSA colonization surveillance program. It is not intended to diagnose MRSA infection  nor to guide or monitor treatment for MRSA infections. Performed at [facility], [place] [address] [place], [place] [address]   Culture, blood (routine x 2)     Status: None   Collection Time: 01/05/21  3:18 PM   Specimen: BLOOD LEFT FOREARM  Result Value Ref Range Status   Specimen Description   Final    BLOOD LEFT FOREARM Performed at [facility] [address] [place], [place] [address]    Special Requests   Final    BOTTLES DRAWN AEROBIC ONLY Blood Culture adequate volume Performed at [facility] [address],  [place], [place] [address]    Culture   Final    NO GROWTH 5 DAYS Performed at [facility], [place] [address] [place], [place] [address]    Report Status 01/10/2021 FINAL  Final  Culture, blood (routine x 2)     Status: Abnormal   Collection Time: 01/05/21  3:18 PM   Specimen: BLOOD  Result Value Ref Range Status   Specimen Description   Final    BLOOD LEFT ANTECUBITAL Performed at [facility] [address], [place], [place] [address]    Special Requests   Final    BOTTLES DRAWN AEROBIC ONLY Blood Culture adequate volume Performed at [facility] [address] [place], [place] [address]    Culture  Setup Time   Final    GRAM POSITIVE COCCI AEROBIC BOTTLE ONLY CRITICAL RESULT CALLED TO, READ BACK BY AND VERIFIED WITH: D,WOFFORD PHARMD _0  01/06/21 EB    Culture (A)  Final    STAPHYLOCOCCUS EPIDERMIDIS THE SIGNIFICANCE OF ISOLATING THIS ORGANISM FROM A SINGLE SET OF BLOOD CULTURES WHEN MULTIPLE SETS ARE DRAWN IS UNCERTAIN. PLEASE NOTIFY THE MICROBIOLOGY DEPARTMENT WITHIN ONE WEEK IF SPECIATION AND SENSITIVITIES ARE REQUIRED. Performed at [facility], [place] [address], [place], [place] [address]    Report Status 01/07/2021 FINAL  Final  Blood Culture ID Panel (Reflexed)     Status: Abnormal   Collection Time: 01/05/21  3:18 PM  Result Value Ref Range Status   Enterococcus faecalis NOT DETECTED NOT DETECTED Final   Enterococcus Faecium NOT DETECTED NOT DETECTED Final   Listeria monocytogenes NOT DETECTED NOT DETECTED Final   Staphylococcus species DETECTED (A) NOT DETECTED Final    Comment: CRITICAL RESULT CALLED TO, READ BACK BY AND VERIFIED WITH: D,WOFFORD PHARMD _1  01/06/21 EB    Staphylococcus aureus (BCID) NOT DETECTED NOT DETECTED Final   Staphylococcus epidermidis DETECTED (A) NOT DETECTED Final    Comment: Methicillin (oxacillin) resistant coagulase negative staphylococcus. Possible blood culture contaminant (unless  isolated from more than one blood culture draw or clinical case suggests pathogenicity). No antibiotic treatment is indicated for blood  culture contaminants. CRITICAL RESULT CALLED TO, READ BACK BY AND VERIFIED WITH: D,WOFFORD PHARMD _2  01/06/21 EB    Staphylococcus lugdunensis NOT DETECTED NOT DETECTED Final   Streptococcus species NOT DETECTED NOT DETECTED Final   Streptococcus agalactiae NOT DETECTED NOT DETECTED Final   Streptococcus pneumoniae NOT DETECTED NOT DETECTED Final   Streptococcus pyogenes NOT DETECTED NOT DETECTED Final   A.calcoaceticus-baumannii NOT DETECTED NOT DETECTED Final   Bacteroides fragilis NOT DETECTED NOT DETECTED Final   Enterobacterales NOT DETECTED NOT DETECTED Final   Enterobacter cloacae complex NOT DETECTED NOT DETECTED Final   Escherichia coli NOT DETECTED NOT DETECTED Final   Klebsiella aerogenes NOT DETECTED NOT DETECTED Final   Klebsiella oxytoca NOT DETECTED  NOT DETECTED Final   Klebsiella pneumoniae NOT DETECTED NOT DETECTED Final   Proteus species NOT DETECTED NOT DETECTED Final   Salmonella species NOT DETECTED NOT DETECTED Final   Serratia marcescens NOT DETECTED NOT DETECTED Final   Haemophilus influenzae NOT DETECTED NOT DETECTED Final   Neisseria meningitidis NOT DETECTED NOT DETECTED Final   Pseudomonas aeruginosa NOT DETECTED NOT DETECTED Final   Stenotrophomonas maltophilia NOT DETECTED NOT DETECTED Final   Candida albicans NOT DETECTED NOT DETECTED Final   Candida auris NOT DETECTED NOT DETECTED Final   Candida glabrata NOT DETECTED NOT DETECTED Final   Candida krusei NOT DETECTED NOT DETECTED Final   Candida parapsilosis NOT DETECTED NOT DETECTED Final   Candida tropicalis NOT DETECTED NOT DETECTED Final   Cryptococcus neoformans/gattii NOT DETECTED NOT DETECTED Final   Methicillin resistance mecA/C DETECTED (A) NOT DETECTED Final    Comment: CRITICAL RESULT CALLED TO, READ BACK BY AND VERIFIED WITH: D,WOFFORD PHARMD _0   01/06/21 EB Performed at [facility], [address] [place], [place] [address]   Culture, blood (routine x 2)     Status: None   Collection Time: 01/06/21  4:05 PM   Specimen: BLOOD  Result Value Ref Range Status   Specimen Description   Final    BLOOD LEFT ANTECUBITAL Performed at [facility] [address], [place], [place] [address]    Special Requests   Final    BOTTLES DRAWN AEROBIC ONLY Blood Culture adequate volume Performed at [facility] [address], [place], [place] [address]    Culture   Final    NO GROWTH 5 DAYS Performed at [facility], [place] [address] [place], [place] [address]    Report Status 01/11/2021 FINAL  Final  Culture, blood (routine x 2)     Status: Abnormal   Collection Time: 01/06/21  4:05 PM   Specimen: BLOOD LEFT HAND  Result Value Ref Range Status   Specimen Description   Final    BLOOD LEFT HAND Performed at [facility] [address] [place], [place] [address]    Special Requests   Final    BOTTLES DRAWN AEROBIC ONLY Blood Culture results may not be optimal due to an inadequate volume of blood received in culture bottles Performed at [facility] [address] [place], [place] [address]    Culture  Setup Time   Final    AEROBIC BOTTLE ONLY GRAM POSITIVE COCCI CRITICAL VALUE NOTED.  VALUE IS CONSISTENT WITH PREVIOUSLY REPORTED AND CALLED VALUE.    Culture (A)  Final    STAPHYLOCOCCUS HOMINIS THE SIGNIFICANCE OF ISOLATING THIS ORGANISM FROM A SINGLE SET OF BLOOD CULTURES WHEN MULTIPLE SETS ARE DRAWN IS UNCERTAIN. PLEASE NOTIFY THE MICROBIOLOGY DEPARTMENT WITHIN ONE WEEK IF SPECIATION AND SENSITIVITIES ARE REQUIRED. Performed at [facility], [place] [address] [place],  [address]    Report Status 01/09/2021 FINAL  Final     Patient was seen and examined on the day of discharge and was found to be in stable condition. Time  coordinating discharge: 35 minutes including assessment and coordination of care, as well as examination of the patient.   SIGNED:  Dessa Phi, DO [facility] 01/12/2021, 11:29 AM

## 2021-01-13 LAB — PROTHROMBIN GENE MUTATION

## 2021-01-13 LAB — SURGICAL PATHOLOGY

## 2021-01-16 ENCOUNTER — Inpatient Hospital Stay: Payer: Medicare Other | Attending: Hematology & Oncology

## 2021-01-16 ENCOUNTER — Inpatient Hospital Stay (HOSPITAL_BASED_OUTPATIENT_CLINIC_OR_DEPARTMENT_OTHER): Payer: Medicare Other | Admitting: Hematology & Oncology

## 2021-01-16 ENCOUNTER — Other Ambulatory Visit: Payer: Self-pay

## 2021-01-16 VITALS — BP 114/57 | HR 72 | Temp 99.2°F | Resp 19 | Ht 65.0 in | Wt 158.0 lb

## 2021-01-16 DIAGNOSIS — D591 Autoimmune hemolytic anemia, unspecified: Secondary | ICD-10-CM | POA: Diagnosis not present

## 2021-01-16 DIAGNOSIS — D5911 Warm autoimmune hemolytic anemia: Secondary | ICD-10-CM | POA: Insufficient documentation

## 2021-01-16 DIAGNOSIS — Z7901 Long term (current) use of anticoagulants: Secondary | ICD-10-CM | POA: Diagnosis not present

## 2021-01-16 DIAGNOSIS — Z7952 Long term (current) use of systemic steroids: Secondary | ICD-10-CM | POA: Diagnosis not present

## 2021-01-16 LAB — RETICULOCYTES
Immature Retic Fract: 32.3 % — ABNORMAL HIGH (ref 2.3–15.9)
RBC.: 2.77 MIL/uL — ABNORMAL LOW (ref 3.87–5.11)
Retic Count, Absolute: 324.6 10*3/uL — ABNORMAL HIGH (ref 19.0–186.0)
Retic Ct Pct: 11.7 % — ABNORMAL HIGH (ref 0.4–3.1)

## 2021-01-16 LAB — CMP (CANCER CENTER ONLY)
ALT: 26 U/L (ref 0–44)
AST: 27 U/L (ref 15–41)
Albumin: 3.7 g/dL (ref 3.5–5.0)
Alkaline Phosphatase: 50 U/L (ref 38–126)
Anion gap: 6 (ref 5–15)
BUN: 14 mg/dL (ref 8–23)
CO2: 25 mmol/L (ref 22–32)
Calcium: 10.1 mg/dL (ref 8.9–10.3)
Chloride: 103 mmol/L (ref 98–111)
Creatinine: 0.72 mg/dL (ref 0.44–1.00)
GFR, Estimated: >60 mL/min (ref >60)
Glucose, Bld: 120 mg/dL — ABNORMAL HIGH (ref 70–99)
Potassium: 4.7 mmol/L (ref 3.5–5.1)
Sodium: 134 mmol/L — ABNORMAL LOW (ref 135–145)
Total Bilirubin: 1 mg/dL (ref 0.3–1.2)
Total Protein: 7.7 g/dL (ref 6.5–8.1)

## 2021-01-16 LAB — CBC WITH DIFFERENTIAL (CANCER CENTER ONLY)
Abs Immature Granulocytes: 0.12 10*3/uL — ABNORMAL HIGH (ref 0.00–0.07)
Basophils Absolute: 0 10*3/uL (ref 0.0–0.1)
Basophils Relative: 0 %
Eosinophils Absolute: 0 10*3/uL (ref 0.0–0.5)
Eosinophils Relative: 0 %
HCT: 30 % — ABNORMAL LOW (ref 36.0–46.0)
Hemoglobin: 10.3 g/dL — ABNORMAL LOW (ref 12.0–15.0)
Immature Granulocytes: 1 %
Lymphocytes Relative: 6 %
Lymphs Abs: 0.8 10*3/uL (ref 0.7–4.0)
MCH: 37.6 pg — ABNORMAL HIGH (ref 26.0–34.0)
MCHC: 34.3 g/dL (ref 30.0–36.0)
MCV: 109.5 fL — ABNORMAL HIGH (ref 80.0–100.0)
Monocytes Absolute: 0.5 10*3/uL (ref 0.1–1.0)
Monocytes Relative: 4 %
Neutro Abs: 12.2 10*3/uL — ABNORMAL HIGH (ref 1.7–7.7)
Neutrophils Relative %: 89 %
Platelet Count: 448 10*3/uL — ABNORMAL HIGH (ref 150–400)
RBC: 2.74 MIL/uL — ABNORMAL LOW (ref 3.87–5.11)
RDW: 24.1 % — ABNORMAL HIGH (ref 11.5–15.5)
WBC Count: 13.7 10*3/uL — ABNORMAL HIGH (ref 4.0–10.5)
nRBC: 0.2 % (ref 0.0–0.2)

## 2021-01-16 LAB — SAVE SMEAR(SSMR), FOR PROVIDER SLIDE REVIEW

## 2021-01-16 LAB — LACTATE DEHYDROGENASE: LDH: 393 U/L — ABNORMAL HIGH (ref 98–192)

## 2021-01-16 NOTE — Progress Notes (Signed)
Hematology and Oncology Follow Up Visit  [name] [id number] [birth year] 75 y.o. 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 daily  IVIG 1 g/kg x 2 days-given in the hospital  Xarelto 20 mg p.o. daily-started on 9/56/3875  Folic acid 2 mg p.o. daily     Interim History:  [name] is back for her first office visit.  I saw her in consultation at [facility] about 2 weeks ago.  She came in with a hemoglobin of about 5.  She had a markedly elevated reticulocyte count.  She had a warm autoantibody.  We went ahead and start her on prednisone.  She also got IVIG.  We had set her up with a bone marrow biopsy to make sure there is no underlying lymphoproliferative disorder.  She had CAT scans that were all done which were unremarkable.  There is no splenomegaly.  She naturally had a CVA while down in the radiology suite.  She has recovered nicely from this.  Her work-up for this was unremarkable.  She however was found to have a thrombus in her right leg.  She was placed on heparin.  We then started her on Xarelto as an outpatient.  Chief I did have the bone marrow biopsy done.  This was done on 01/12/2021.  The pathology report ([id number]) showed a hypercellular marrow with trilineage hematopoiesis.  There were no monoclonal B cells.  There is no evidence of any underlying malignancy.  We did a hypercoagulable study on her.  She had a mildly depressed Protein S level of 58%.  She was subsequently discharged from the hospital.  She is on prednisone at 60 mg a day right now.  She is feeling better.  Her reticulocyte count is coming down.  Her hemoglobin is starting to trend upward.  She has had no bleeding.  She has had no change in bowel or bladder habits.  There is been no rashes.  She has  had no leg swelling.  Currently, I would say performance status is probably ECOG 1.  Medications:  Current Outpatient Medications:  .  acetaminophen (TYLENOL) 500 MG tablet, Take 1,000 mg by mouth every 6 (six) hours as needed for headache., Disp: , Rfl:  .  Calcium Carbonate+Vitamin D 600-200 MG-UNIT TABS, Take 2 tablets by mouth 2 (two) times daily., Disp: , Rfl:  .  famciclovir (FAMVIR) 500 MG tablet, Take 1 tablet (500 mg total) by mouth daily., Disp: 30 tablet, Rfl: 0 .  fluconazole (DIFLUCAN) 100 MG tablet, Take 1 tablet (100 mg total) by mouth daily., Disp: 30 tablet, Rfl: 0 .  gabapentin (NEURONTIN) 300 MG capsule, Take 313m(1 cap) in the morning, 3058m1 cap) in the afternoon and 60036m cap) before bed (Patient taking differently: Take 300 mg by mouth See admin instructions. Take 300  mg in the morning, 300 mg in the afternoon and 600m19mfore bed), Disp: 360 capsule, Rfl: 1 .  levothyroxine (SYNTHROID) 112 MCG tablet, Take 112 mcg by mouth daily before breakfast., Disp: , Rfl:  .  LORazepam (ATIVAN) 2 MG tablet, Take 2 mg by mouth 3 (three) times daily as needed for anxiety., Disp: , Rfl:  .  Menthol, Topical Analgesic, (ICY HOT EX), Apply 1 application topically as needed (back pain)., Disp: , Rfl:  .  Multiple Vitamins-Minerals (MULTIVITAMIN WITH  MINERALS) tablet, Take 1 tablet by mouth daily., Disp: , Rfl:  .  pantoprazole (PROTONIX) 40 MG tablet, Take 1 tablet (40 mg total) by mouth 2 (two) times daily., Disp: 60 tablet, Rfl: 0 .  polycarbophil (FIBERCON) 625 MG tablet, Take 1,250 mg by mouth daily., Disp: , Rfl:  .  predniSONE (DELTASONE) 20 MG tablet, Take 3 tablets (60 mg total) by mouth daily with breakfast., Disp: 90 tablet, Rfl: 0 .  RIVAROXABAN (XARELTO) VTE STARTER PACK (15 & 20 MG), Follow package directions: Take one 64m tablet by mouth twice a day. On day 22, switch to one 288mtablet once a day. Take with food., Disp: 51 each, Rfl: 0 .  RIVAROXABAN (XARELTO) VTE  STARTER PACK (15 & 20 MG), Use as directed, Disp: 51 each, Rfl: 0 .  sertraline (ZOLOFT) 50 MG tablet, Take 50 mg by mouth daily., Disp: , Rfl:  .  sucralfate (CARAFATE) 1 g tablet, Take 1 g by mouth 2 (two) times daily., Disp: , Rfl:   Allergies:  Allergies  Allergen Reactions  . Aspirin     Avoid due to stomach issues  . Cephalexin     Stomach cramps  . Imipenem     Warm and tingly all over  . Losartan Potassium-Hctz     Other reaction(s): do not remember  . Oxycodone     Hallucinations   . Statins     leg pain, muscle cramps  . Clindamycin/Lincomycin Nausea And Vomiting and Rash  . Contrast Media [Iodinated Diagnostic Agents] Rash  . Penicillins Rash    Did it involve swelling of the face/tongue/throat, SOB, or low BP? No Did it involve sudden or severe rash/hives, skin peeling, or any reaction on the inside of your mouth or nose? Yes Did you need to seek medical attention at a hospital or doctor's office? No When did it last happen?2011 If all above answers are "NO", may proceed with cephalosporin use.   . Quinolones Rash    Past Medical History, Surgical history, Social history, and Family History were reviewed and updated.  Review of Systems: Review of Systems  Constitutional: Positive for fatigue.  HENT:  Negative.   Eyes: Negative.   Respiratory: Negative.   Gastrointestinal: Negative.   Endocrine: Negative.   Genitourinary: Negative.    Musculoskeletal: Negative.   Neurological: Positive for numbness.  Hematological: Negative.   Psychiatric/Behavioral: Negative.     Physical Exam:  height is 5' 5"  (1.651 m) and weight is 158 lb (71.7 kg). Her other (comment) temperature is 99.2 F (37.3 C). Her blood pressure is 114/57 (abnormal) and her pulse is 72. Her respiration is 19 and oxygen saturation is 98%.   Wt Readings from Last 3 Encounters:  01/16/21 158 lb (71.7 kg)  01/09/21 159 lb 13.3 oz (72.5 kg)  09/18/20 160 lb (72.6 kg)    Physical  Exam Vitals reviewed.  HENT:     Head: Normocephalic and atraumatic.  Eyes:     Pupils: Pupils are equal, round, and reactive to light.  Cardiovascular:     Rate and Rhythm: Normal rate and regular rhythm.     Heart sounds: Normal heart sounds.  Pulmonary:     Effort: Pulmonary effort is normal.     Breath sounds: Normal breath sounds.  Abdominal:     General: Bowel sounds are normal.     Palpations: Abdomen is soft.  Musculoskeletal:        General: No tenderness or deformity. Normal range of motion.  Cervical back: Normal range of motion.     Comments: Extremities shows no clubbing, cyanosis or edema.  She has good range of motion of her joints.  There may be some slight decrease sensation in the pretibial region of the right lower leg.  She has no palpable venous cords.  She has negative Homans sign.  Lymphadenopathy:     Cervical: No cervical adenopathy.  Skin:    General: Skin is warm and dry.     Findings: No erythema or rash.  Neurological:     Mental Status: She is alert and oriented to person, place, and time.  Psychiatric:        Behavior: Behavior normal.        Thought Content: Thought content normal.        Judgment: Judgment normal.      Lab Results  Component Value Date   WBC 13.7 (H) 01/16/2021   HGB 10.3 (L) 01/16/2021   HCT 30.0 (L) 01/16/2021   MCV 109.5 (H) 01/16/2021   PLT 448 (H) 01/16/2021     Chemistry      Component Value Date/Time   NA 134 (L) 01/16/2021 1046   NA 139 08/21/2020 1519   K 4.7 01/16/2021 1046   CL 103 01/16/2021 1046   CO2 25 01/16/2021 1046   BUN 14 01/16/2021 1046   BUN 8 08/21/2020 1519   CREATININE 0.72 01/16/2021 1046      Component Value Date/Time   CALCIUM 10.1 01/16/2021 1046   ALKPHOS 50 01/16/2021 1046   AST 27 01/16/2021 1046   ALT 26 01/16/2021 1046   BILITOT 1.0 01/16/2021 1046       Impression and Plan: Ms. Fraleigh is a very charming 75 year old white female.  She presented with idiopathic  autoimmune hemolytic anemia.  This was a warm autoimmune hemolytic anemia.  She had a positive direct Coombs test.  She is improving.  Her reticulocyte count is coming down nicely.  Her hemoglobin is trending upward.  She is on folic acid.  We will taper her prednisone dose down slowly.  I wrote out how to taper for the next couple weeks.  She will alternate 60 mg a day with 40 mg a day.  She will do this for 1 week and then go down to 40 mg a day.  She we will switch over to the 20 mg daily dose of Xarelto.  She is on the loading dose for right now.  I would not do a Doppler or MRI right now.  I would like to wait another month or so before we do any follow-up studies for the thromboembolic disease and the CVA.  I really thought that we were going to find an underlying lymphoproliferative disorder.  I am thankful that the bone marrow test did not show any of that.  She comes in with her husband.  He served in the [employer]  I thanked him for his service in the [employer].  I would like to see her back in a couple weeks.  If she continues to improve with her blood counts, then we will try to move her appointments out longer and longer.   Volanda Napoleon, MD 4/29/20222:28 PM

## 2021-01-20 ENCOUNTER — Encounter (HOSPITAL_COMMUNITY): Payer: Self-pay | Admitting: Hematology & Oncology

## 2021-01-29 ENCOUNTER — Inpatient Hospital Stay: Payer: Medicare Other | Attending: Hematology & Oncology

## 2021-01-29 ENCOUNTER — Telehealth: Payer: Self-pay

## 2021-01-29 ENCOUNTER — Encounter: Payer: Self-pay | Admitting: Hematology & Oncology

## 2021-01-29 ENCOUNTER — Inpatient Hospital Stay (HOSPITAL_BASED_OUTPATIENT_CLINIC_OR_DEPARTMENT_OTHER): Payer: Medicare Other | Admitting: Hematology & Oncology

## 2021-01-29 VITALS — BP 128/71 | HR 66 | Temp 98.5°F | Resp 18 | Wt 154.0 lb

## 2021-01-29 DIAGNOSIS — I82401 Acute embolism and thrombosis of unspecified deep veins of right lower extremity: Secondary | ICD-10-CM | POA: Diagnosis not present

## 2021-01-29 DIAGNOSIS — Z79899 Other long term (current) drug therapy: Secondary | ICD-10-CM | POA: Diagnosis not present

## 2021-01-29 DIAGNOSIS — D591 Autoimmune hemolytic anemia, unspecified: Secondary | ICD-10-CM

## 2021-01-29 DIAGNOSIS — D5911 Warm autoimmune hemolytic anemia: Secondary | ICD-10-CM | POA: Insufficient documentation

## 2021-01-29 DIAGNOSIS — Z7952 Long term (current) use of systemic steroids: Secondary | ICD-10-CM | POA: Insufficient documentation

## 2021-01-29 DIAGNOSIS — Z7901 Long term (current) use of anticoagulants: Secondary | ICD-10-CM | POA: Diagnosis not present

## 2021-01-29 DIAGNOSIS — Z8673 Personal history of transient ischemic attack (TIA), and cerebral infarction without residual deficits: Secondary | ICD-10-CM | POA: Diagnosis not present

## 2021-01-29 LAB — CBC WITH DIFFERENTIAL (CANCER CENTER ONLY)
Abs Immature Granulocytes: 0.16 10*3/uL — ABNORMAL HIGH (ref 0.00–0.07)
Basophils Absolute: 0 10*3/uL (ref 0.0–0.1)
Basophils Relative: 0 %
Eosinophils Absolute: 0 10*3/uL (ref 0.0–0.5)
Eosinophils Relative: 0 %
HCT: 38 % (ref 36.0–46.0)
Hemoglobin: 12.7 g/dL (ref 12.0–15.0)
Immature Granulocytes: 2 %
Lymphocytes Relative: 4 %
Lymphs Abs: 0.4 10*3/uL — ABNORMAL LOW (ref 0.7–4.0)
MCH: 35.5 pg — ABNORMAL HIGH (ref 26.0–34.0)
MCHC: 33.4 g/dL (ref 30.0–36.0)
MCV: 106.1 fL — ABNORMAL HIGH (ref 80.0–100.0)
Monocytes Absolute: 0.1 10*3/uL (ref 0.1–1.0)
Monocytes Relative: 1 %
Neutro Abs: 9.2 10*3/uL — ABNORMAL HIGH (ref 1.7–7.7)
Neutrophils Relative %: 93 %
Platelet Count: 303 10*3/uL (ref 150–400)
RBC: 3.58 MIL/uL — ABNORMAL LOW (ref 3.87–5.11)
RDW: 15.7 % — ABNORMAL HIGH (ref 11.5–15.5)
WBC Count: 9.9 10*3/uL (ref 4.0–10.5)
nRBC: 0 % (ref 0.0–0.2)

## 2021-01-29 LAB — CMP (CANCER CENTER ONLY)
ALT: 23 U/L (ref 0–44)
AST: 21 U/L (ref 15–41)
Albumin: 4.1 g/dL (ref 3.5–5.0)
Alkaline Phosphatase: 41 U/L (ref 38–126)
Anion gap: 11 (ref 5–15)
BUN: 15 mg/dL (ref 8–23)
CO2: 26 mmol/L (ref 22–32)
Calcium: 10.3 mg/dL (ref 8.9–10.3)
Chloride: 99 mmol/L (ref 98–111)
Creatinine: 0.76 mg/dL (ref 0.44–1.00)
GFR, Estimated: >60 mL/min (ref >60)
Glucose, Bld: 182 mg/dL — ABNORMAL HIGH (ref 70–99)
Potassium: 4.1 mmol/L (ref 3.5–5.1)
Sodium: 136 mmol/L (ref 135–145)
Total Bilirubin: 0.6 mg/dL (ref 0.3–1.2)
Total Protein: 7 g/dL (ref 6.5–8.1)

## 2021-01-29 LAB — RETICULOCYTES
Immature Retic Fract: 17.6 % — ABNORMAL HIGH (ref 2.3–15.9)
RBC.: 3.51 MIL/uL — ABNORMAL LOW (ref 3.87–5.11)
Retic Count, Absolute: 159.7 10*3/uL (ref 19.0–186.0)
Retic Ct Pct: 4.6 % — ABNORMAL HIGH (ref 0.4–3.1)

## 2021-01-29 LAB — LACTATE DEHYDROGENASE: LDH: 263 U/L — ABNORMAL HIGH (ref 98–192)

## 2021-01-29 LAB — DIRECT ANTIGLOBULIN TEST (NOT AT ARMC)
DAT, IgG: NEGATIVE
DAT, complement: NEGATIVE

## 2021-01-29 MED ORDER — RIVAROXABAN 20 MG PO TABS: 20.0000 mg | ORAL_TABLET | Freq: Every day | ORAL | 2 refills | Status: DC

## 2021-01-29 NOTE — Progress Notes (Signed)
Hematology and Oncology Follow Up Visit  [name] [id number] March 19, 1946 75 y.o. 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 daily  IVIG 1 g/kg x 2 days-given in the hospital  Xarelto 20 mg p.o. daily-started on 01/08/2021  Folic acid 2 mg p.o. daily     Interim History:  [name] is back for her follow-up.  She is doing quite nicely.  She is responding as a new she would do the prednisone.  We have her on a steroid taper.  Her hemoglobin today is 12.7.  Everything is looking really good.  We will get her off the prednisone in about 3 weeks now.  She still has the Xarelto for the thromboembolism in the right leg.  At some point, we will have to recheck the right leg to see how the embolism is doing.  She has had no bleeding.  There is been no fever.  She has had no cough or shortness of breath.  She has had no nausea or vomiting.    Currently, I would say performance status is probably ECOG 1.  Medications:  Current Outpatient Medications:  .  acetaminophen (TYLENOL) 500 MG tablet, Take 1,000 mg by mouth every 6 (six) hours as needed for headache., Disp: , Rfl:  .  Calcium Carbonate+Vitamin D 600-200 MG-UNIT TABS, Take 2 tablets by mouth 2 (two) times daily., Disp: , Rfl:  .  famciclovir (FAMVIR) 500 MG tablet, Take 1 tablet (500 mg total) by mouth daily., Disp: 30 tablet, Rfl: 0 .  fluconazole (DIFLUCAN) 100 MG tablet, Take 1 tablet (100 mg total) by mouth daily., Disp: 30 tablet, Rfl: 0 .  gabapentin (NEURONTIN) 300 MG capsule, Take 300mg (1 cap) in the morning, 300mg (1 cap) in the afternoon and 600mg (2 cap) before bed (Patient taking differently: Take 300 mg by mouth See admin instructions. Take 300  mg in the morning, 300 mg in the afternoon and 600mg  before bed), Disp: 360 capsule, Rfl:  1 .  levothyroxine (SYNTHROID) 112 MCG tablet, Take 112 mcg by mouth daily before breakfast., Disp: , Rfl:  .  LORazepam (ATIVAN) 2 MG tablet, Take 2 mg by mouth 3 (three) times daily as needed for anxiety., Disp: , Rfl:  .  Menthol, Topical Analgesic, (ICY HOT EX), Apply 1 application topically as needed (back pain)., Disp: , Rfl:  .  Multiple Vitamins-Minerals (MULTIVITAMIN WITH MINERALS) tablet, Take 1 tablet by mouth daily., Disp: , Rfl:  .  pantoprazole (PROTONIX) 40 MG tablet, Take 1 tablet (40 mg total) by mouth 2 (two) times daily., Disp: 60 tablet, Rfl: 0 .  polycarbophil (FIBERCON) 625 MG tablet, Take 1,250 mg by mouth daily., Disp: , Rfl:  .  predniSONE (DELTASONE) 20 MG tablet, Take 3 tablets (60 mg total) by mouth daily with breakfast., Disp: 90 tablet, Rfl: 0 .  RIVAROXABAN (XARELTO) VTE STARTER PACK (15 & 20 MG), Follow package directions: Take one 15mg  tablet by mouth twice a day. On day 22, switch to one 20mg  tablet once a day. Take with food., Disp: 51 each, Rfl: 0 .  RIVAROXABAN (XARELTO) VTE STARTER PACK (15 & 20 MG), Use as directed, Disp: 51 each, Rfl: 0 .  sertraline (ZOLOFT) 50 MG tablet, Take 50 mg by mouth daily., Disp: , Rfl:  .  sucralfate (CARAFATE) 1 g tablet, Take 1 g  by mouth 2 (two) times daily., Disp: , Rfl:   Allergies:  Allergies  Allergen Reactions  . Aspirin     Avoid due to stomach issues  . Cephalexin     Stomach cramps  . Imipenem     Warm and tingly all over  . Losartan Potassium-Hctz     Other reaction(s): do not remember  . Oxycodone     Hallucinations   . Statins     leg pain, muscle cramps  . Clindamycin/Lincomycin Nausea And Vomiting and Rash  . Contrast Media [Iodinated Diagnostic Agents] Rash  . Penicillins Rash    Did it involve swelling of the face/tongue/throat, SOB, or low BP? No Did it involve sudden or severe rash/hives, skin peeling, or any reaction on the inside of your mouth or nose? Yes Did you need to seek medical attention  at a hospital or doctor's office? No When did it last happen?2011 If all above answers are "NO", may proceed with cephalosporin use.   . Quinolones Rash    Past Medical History, Surgical history, Social history, and Family History were reviewed and updated.  Review of Systems: Review of Systems  Constitutional: Positive for fatigue.  HENT:  Negative.   Eyes: Negative.   Respiratory: Negative.   Gastrointestinal: Negative.   Endocrine: Negative.   Genitourinary: Negative.    Musculoskeletal: Negative.   Neurological: Positive for numbness.  Hematological: Negative.   Psychiatric/Behavioral: Negative.     Physical Exam:  weight is 154 lb (69.9 kg). Her oral temperature is 98.5 F (36.9 C). Her blood pressure is 128/71 and her pulse is 66. Her respiration is 18 and oxygen saturation is 100%.   Wt Readings from Last 3 Encounters:  01/29/21 154 lb (69.9 kg)  01/16/21 158 lb (71.7 kg)  01/09/21 159 lb 13.3 oz (72.5 kg)    Physical Exam Vitals reviewed.  HENT:     Head: Normocephalic and atraumatic.  Eyes:     Pupils: Pupils are equal, round, and reactive to light.  Cardiovascular:     Rate and Rhythm: Normal rate and regular rhythm.     Heart sounds: Normal heart sounds.  Pulmonary:     Effort: Pulmonary effort is normal.     Breath sounds: Normal breath sounds.  Abdominal:     General: Bowel sounds are normal.     Palpations: Abdomen is soft.  Musculoskeletal:        General: No tenderness or deformity. Normal range of motion.     Cervical back: Normal range of motion.     Comments: Extremities shows no clubbing, cyanosis or edema.  She has good range of motion of her joints.  There may be some slight decrease sensation in the pretibial region of the right lower leg.  She has no palpable venous cords.  She has negative Homans sign.  Lymphadenopathy:     Cervical: No cervical adenopathy.  Skin:    General: Skin is warm and dry.     Findings: No erythema or  rash.  Neurological:     Mental Status: She is alert and oriented to person, place, and time.  Psychiatric:        Behavior: Behavior normal.        Thought Content: Thought content normal.        Judgment: Judgment normal.      Lab Results  Component Value Date   WBC 9.9 01/29/2021   HGB 12.7 01/29/2021   HCT 38.0 01/29/2021   MCV  106.1 (H) 01/29/2021   PLT 303 01/29/2021     Chemistry      Component Value Date/Time   NA 134 (L) 01/16/2021 1046   NA 139 08/21/2020 1519   K 4.7 01/16/2021 1046   CL 103 01/16/2021 1046   CO2 25 01/16/2021 1046   BUN 14 01/16/2021 1046   BUN 8 08/21/2020 1519   CREATININE 0.72 01/16/2021 1046      Component Value Date/Time   CALCIUM 10.1 01/16/2021 1046   ALKPHOS 50 01/16/2021 1046   AST 27 01/16/2021 1046   ALT 26 01/16/2021 1046   BILITOT 1.0 01/16/2021 1046       Impression and Plan: [name] is a very charming 75 year old white female.  She presented with idiopathic autoimmune hemolytic anemia.  This was a warm autoimmune hemolytic anemia.  She had a positive direct Coombs test.  She is improving.  Her reticulocyte count is coming down nicely.  Her hemoglobin is trending upward.  At this point, we will now get her off the prednisone.  She is on 40 mg a day right now.  We will have her take 20 mg a day for 1 week.  She will then take 10 mg a day for 1 week.  Finally, she will take 5 mg a day for 1 week.  As far as the blood clot is concerned, we will do a Doppler of her right leg when we do see her back.  At that point, she would have been on Xarelto for a good 2 months or so.  She is doing well with respect to this CVA.  Her muscular that we have to do any MRI of the brain.  I would like to repeat her Protein S as level to make sure that this is still not on the lower side.  I will plan to have her come back to see [place] now in about 6 weeks or so.  I am just glad that she has recovered so well from this profound  hemolysis.   Josph Macho, MD 5/12/20221:49 PM

## 2021-01-29 NOTE — Telephone Encounter (Signed)
appts made and printed per 01/29/21 los, pt aware that she will get a call for her u/s   Jeremiah Tarpley

## 2021-01-30 LAB — IGG, IGA, IGM
IgA: 191 mg/dL (ref 64–422)
IgG (Immunoglobin G), Serum: 1277 mg/dL (ref 586–1602)
IgM (Immunoglobulin M), Srm: 184 mg/dL (ref 26–217)

## 2021-02-03 ENCOUNTER — Other Ambulatory Visit: Payer: Self-pay | Admitting: *Deleted

## 2021-02-03 ENCOUNTER — Telehealth: Payer: Self-pay | Admitting: *Deleted

## 2021-02-03 NOTE — Telephone Encounter (Signed)
Recd call from patient inquiring about her Rx for Xarelto.  Stated we should get a request from [facility] pharmacy to be sent there for  Patient assistance.  Told patient we have not received this yet.

## 2021-02-09 ENCOUNTER — Telehealth: Payer: Self-pay

## 2021-02-09 NOTE — Telephone Encounter (Signed)
returned pts call to r/s her appt as she has a conflict   [name]

## 2021-02-16 ENCOUNTER — Other Ambulatory Visit: Payer: Self-pay | Admitting: Neurology

## 2021-02-18 LAB — SURGICAL PATHOLOGY

## 2021-02-19 ENCOUNTER — Emergency Department (HOSPITAL_COMMUNITY): Payer: Medicare Other

## 2021-02-19 ENCOUNTER — Other Ambulatory Visit: Payer: Self-pay

## 2021-02-19 ENCOUNTER — Encounter (HOSPITAL_COMMUNITY): Payer: Self-pay

## 2021-02-19 ENCOUNTER — Telehealth: Payer: Self-pay | Admitting: *Deleted

## 2021-02-19 ENCOUNTER — Inpatient Hospital Stay (HOSPITAL_COMMUNITY)
Admission: EM | Admit: 2021-02-19 | Discharge: 2021-02-25 | DRG: 810 | Disposition: A | Discharge status: Home Health | Payer: Medicare Other | Attending: Internal Medicine | Admitting: Internal Medicine

## 2021-02-19 DIAGNOSIS — D599 Acquired hemolytic anemia, unspecified: Secondary | ICD-10-CM | POA: Diagnosis present

## 2021-02-19 DIAGNOSIS — I493 Ventricular premature depolarization: Secondary | ICD-10-CM | POA: Diagnosis present

## 2021-02-19 DIAGNOSIS — D5911 Warm autoimmune hemolytic anemia: Principal | ICD-10-CM | POA: Diagnosis present

## 2021-02-19 DIAGNOSIS — G43909 Migraine, unspecified, not intractable, without status migrainosus: Secondary | ICD-10-CM | POA: Diagnosis present

## 2021-02-19 DIAGNOSIS — Z79899 Other long term (current) drug therapy: Secondary | ICD-10-CM | POA: Diagnosis not present

## 2021-02-19 DIAGNOSIS — Z96611 Presence of right artificial shoulder joint: Secondary | ICD-10-CM | POA: Diagnosis present

## 2021-02-19 DIAGNOSIS — Z7901 Long term (current) use of anticoagulants: Secondary | ICD-10-CM | POA: Diagnosis not present

## 2021-02-19 DIAGNOSIS — Z880 Allergy status to penicillin: Secondary | ICD-10-CM

## 2021-02-19 DIAGNOSIS — I82409 Acute embolism and thrombosis of unspecified deep veins of unspecified lower extremity: Secondary | ICD-10-CM | POA: Diagnosis present

## 2021-02-19 DIAGNOSIS — G709 Myoneural disorder, unspecified: Secondary | ICD-10-CM | POA: Diagnosis present

## 2021-02-19 DIAGNOSIS — F410 Panic disorder [episodic paroxysmal anxiety] without agoraphobia: Secondary | ICD-10-CM | POA: Diagnosis present

## 2021-02-19 DIAGNOSIS — Z8673 Personal history of transient ischemic attack (TIA), and cerebral infarction without residual deficits: Secondary | ICD-10-CM | POA: Diagnosis not present

## 2021-02-19 DIAGNOSIS — Z87891 Personal history of nicotine dependence: Secondary | ICD-10-CM

## 2021-02-19 DIAGNOSIS — Z888 Allergy status to other drugs, medicaments and biological substances status: Secondary | ICD-10-CM

## 2021-02-19 DIAGNOSIS — Z886 Allergy status to analgesic agent status: Secondary | ICD-10-CM

## 2021-02-19 DIAGNOSIS — I82502 Chronic embolism and thrombosis of unspecified deep veins of left lower extremity: Secondary | ICD-10-CM

## 2021-02-19 DIAGNOSIS — R0602 Shortness of breath: Secondary | ICD-10-CM | POA: Diagnosis present

## 2021-02-19 DIAGNOSIS — D591 Autoimmune hemolytic anemia, unspecified: Secondary | ICD-10-CM

## 2021-02-19 DIAGNOSIS — D589 Hereditary hemolytic anemia, unspecified: Secondary | ICD-10-CM | POA: Diagnosis present

## 2021-02-19 DIAGNOSIS — Z8249 Family history of ischemic heart disease and other diseases of the circulatory system: Secondary | ICD-10-CM | POA: Diagnosis not present

## 2021-02-19 DIAGNOSIS — E876 Hypokalemia: Secondary | ICD-10-CM | POA: Diagnosis present

## 2021-02-19 DIAGNOSIS — G629 Polyneuropathy, unspecified: Secondary | ICD-10-CM | POA: Diagnosis present

## 2021-02-19 DIAGNOSIS — Z833 Family history of diabetes mellitus: Secondary | ICD-10-CM

## 2021-02-19 DIAGNOSIS — Z881 Allergy status to other antibiotic agents status: Secondary | ICD-10-CM

## 2021-02-19 DIAGNOSIS — Z91041 Radiographic dye allergy status: Secondary | ICD-10-CM

## 2021-02-19 DIAGNOSIS — Z20822 Contact with and (suspected) exposure to covid-19: Secondary | ICD-10-CM | POA: Diagnosis present

## 2021-02-19 DIAGNOSIS — Z8261 Family history of arthritis: Secondary | ICD-10-CM | POA: Diagnosis not present

## 2021-02-19 DIAGNOSIS — Z86718 Personal history of other venous thrombosis and embolism: Secondary | ICD-10-CM | POA: Diagnosis not present

## 2021-02-19 DIAGNOSIS — D649 Anemia, unspecified: Secondary | ICD-10-CM | POA: Diagnosis present

## 2021-02-19 DIAGNOSIS — Z79890 Hormone replacement therapy (postmenopausal): Secondary | ICD-10-CM | POA: Diagnosis not present

## 2021-02-19 DIAGNOSIS — F419 Anxiety disorder, unspecified: Secondary | ICD-10-CM | POA: Diagnosis not present

## 2021-02-19 DIAGNOSIS — E039 Hypothyroidism, unspecified: Secondary | ICD-10-CM | POA: Diagnosis present

## 2021-02-19 DIAGNOSIS — I82451 Acute embolism and thrombosis of right peroneal vein: Secondary | ICD-10-CM | POA: Diagnosis not present

## 2021-02-19 DIAGNOSIS — Z885 Allergy status to narcotic agent status: Secondary | ICD-10-CM

## 2021-02-19 DIAGNOSIS — Z823 Family history of stroke: Secondary | ICD-10-CM

## 2021-02-19 HISTORY — DX: Anxiety disorder, unspecified: F41.9

## 2021-02-19 LAB — CBC
HCT: 20.8 % — ABNORMAL LOW (ref 36.0–46.0)
Hemoglobin: 6.6 g/dL — CL (ref 12.0–15.0)
MCH: 37.5 pg — ABNORMAL HIGH (ref 26.0–34.0)
MCHC: 31.7 g/dL (ref 30.0–36.0)
MCV: 118.2 fL — ABNORMAL HIGH (ref 80.0–100.0)
Platelets: 415 10*3/uL — ABNORMAL HIGH (ref 150–400)
RBC: 1.76 MIL/uL — ABNORMAL LOW (ref 3.87–5.11)
RDW: 20.1 % — ABNORMAL HIGH (ref 11.5–15.5)
WBC: 9.3 10*3/uL (ref 4.0–10.5)
nRBC: 7 % — ABNORMAL HIGH (ref 0.0–0.2)

## 2021-02-19 LAB — BASIC METABOLIC PANEL
Anion gap: 5 (ref 5–15)
BUN: 11 mg/dL (ref 8–23)
CO2: 23 mmol/L (ref 22–32)
Calcium: 9.2 mg/dL (ref 8.9–10.3)
Chloride: 111 mmol/L (ref 98–111)
Creatinine, Ser: 0.47 mg/dL (ref 0.44–1.00)
GFR, Estimated: >60 mL/min (ref >60)
Glucose, Bld: 111 mg/dL — ABNORMAL HIGH (ref 70–99)
Potassium: 3.4 mmol/L — ABNORMAL LOW (ref 3.5–5.1)
Sodium: 139 mmol/L (ref 135–145)

## 2021-02-19 LAB — IRON AND TIBC
Iron: 125 ug/dL (ref 28–170)
Saturation Ratios: 41 % — ABNORMAL HIGH (ref 10.4–31.8)
TIBC: 304 ug/dL (ref 250–450)
UIBC: 179 ug/dL

## 2021-02-19 LAB — HEPATIC FUNCTION PANEL
ALT: 20 U/L (ref 0–44)
AST: 38 U/L (ref 15–41)
Albumin: 3.4 g/dL — ABNORMAL LOW (ref 3.5–5.0)
Alkaline Phosphatase: 39 U/L (ref 38–126)
Bilirubin, Direct: 0.2 mg/dL (ref 0.0–0.2)
Indirect Bilirubin: 2.1 mg/dL — ABNORMAL HIGH (ref 0.3–0.9)
Total Bilirubin: 2.3 mg/dL — ABNORMAL HIGH (ref 0.3–1.2)
Total Protein: 6 g/dL — ABNORMAL LOW (ref 6.5–8.1)

## 2021-02-19 LAB — RETICULOCYTES
Immature Retic Fract: 42.5 % — ABNORMAL HIGH (ref 2.3–15.9)
RBC.: 1.71 MIL/uL — ABNORMAL LOW (ref 3.87–5.11)
Retic Count, Absolute: 266.6 10*3/uL — ABNORMAL HIGH (ref 19.0–186.0)
Retic Ct Pct: 15.6 % — ABNORMAL HIGH (ref 0.4–3.1)

## 2021-02-19 LAB — TROPONIN I (HIGH SENSITIVITY)
Troponin I (High Sensitivity): 11 ng/L (ref <18)
Troponin I (High Sensitivity): 14 ng/L (ref <18)

## 2021-02-19 LAB — FERRITIN: Ferritin: 798 ng/mL — ABNORMAL HIGH (ref 11–307)

## 2021-02-19 LAB — SARS CORONAVIRUS 2 (TAT 6-24 HRS): SARS Coronavirus 2: NEGATIVE

## 2021-02-19 LAB — PREPARE RBC (CROSSMATCH)

## 2021-02-19 LAB — FOLATE: Folate: 37.6 ng/mL (ref >5.9)

## 2021-02-19 LAB — MAGNESIUM: Magnesium: 1.6 mg/dL — ABNORMAL LOW (ref 1.7–2.4)

## 2021-02-19 LAB — VITAMIN B12: Vitamin B-12: 479 pg/mL (ref 180–914)

## 2021-02-19 MED ORDER — ACETAMINOPHEN 325 MG PO TABS
650.0000 mg | ORAL_TABLET | Freq: Four times a day (QID) | ORAL | Status: DC | PRN
  Administered 2021-02-20 – 2021-02-24 (×5): 650 mg via ORAL
  Filled 2021-02-19 (×5): qty 2

## 2021-02-19 MED ORDER — FLUCONAZOLE 100 MG PO TABS
100.0000 mg | ORAL_TABLET | Freq: Every day | ORAL | Status: DC
  Administered 2021-02-19 – 2021-02-25 (×7): 100 mg via ORAL
  Filled 2021-02-19 (×7): qty 1

## 2021-02-19 MED ORDER — MAGNESIUM SULFATE 4 GM/100ML IV SOLN
4.0000 g | Freq: Once | INTRAVENOUS | Status: AC
  Administered 2021-02-19: 4 g via INTRAVENOUS
  Filled 2021-02-19: qty 100

## 2021-02-19 MED ORDER — POLYETHYLENE GLYCOL 3350 17 G PO PACK: 17.0000 g | PACK | Freq: Every day | ORAL | Status: DC | PRN

## 2021-02-19 MED ORDER — FOLIC ACID 1 MG PO TABS
2.0000 mg | ORAL_TABLET | Freq: Every day | ORAL | Status: DC
  Administered 2021-02-19 – 2021-02-25 (×7): 2 mg via ORAL
  Filled 2021-02-19 (×8): qty 2

## 2021-02-19 MED ORDER — GABAPENTIN 300 MG PO CAPS
300.0000 mg | ORAL_CAPSULE | Freq: Four times a day (QID) | ORAL | Status: DC
  Administered 2021-02-19 – 2021-02-25 (×23): 300 mg via ORAL
  Filled 2021-02-19 (×22): qty 1

## 2021-02-19 MED ORDER — SODIUM CHLORIDE 0.9 % IV SOLN: 10.0000 mL/h | Freq: Once | INTRAVENOUS | Status: DC

## 2021-02-19 MED ORDER — RIVAROXABAN 20 MG PO TABS
20.0000 mg | ORAL_TABLET | Freq: Every day | ORAL | Status: DC
  Administered 2021-02-19 – 2021-02-24 (×6): 20 mg via ORAL
  Filled 2021-02-19 (×7): qty 1

## 2021-02-19 MED ORDER — POTASSIUM CHLORIDE CRYS ER 20 MEQ PO TBCR
20.0000 meq | EXTENDED_RELEASE_TABLET | Freq: Once | ORAL | Status: AC
  Administered 2021-02-19: 20 meq via ORAL
  Filled 2021-02-19: qty 1

## 2021-02-19 MED ORDER — DIPHENHYDRAMINE HCL 50 MG/ML IJ SOLN
25.0000 mg | Freq: Once | INTRAMUSCULAR | Status: AC
  Administered 2021-02-20: 25 mg via INTRAVENOUS
  Filled 2021-02-19: qty 1

## 2021-02-19 MED ORDER — LACTATED RINGERS IV SOLN: INTRAVENOUS | Status: AC

## 2021-02-19 MED ORDER — PANTOPRAZOLE SODIUM 40 MG PO TBEC
40.0000 mg | DELAYED_RELEASE_TABLET | Freq: Two times a day (BID) | ORAL | Status: DC | PRN
  Administered 2021-02-20 – 2021-02-22 (×2): 40 mg via ORAL
  Filled 2021-02-19 (×2): qty 1

## 2021-02-19 MED ORDER — SERTRALINE HCL 50 MG PO TABS
50.0000 mg | ORAL_TABLET | Freq: Every day | ORAL | Status: DC
  Administered 2021-02-20 – 2021-02-25 (×6): 50 mg via ORAL
  Filled 2021-02-19 (×6): qty 1

## 2021-02-19 MED ORDER — LORAZEPAM 1 MG PO TABS
2.0000 mg | ORAL_TABLET | Freq: Three times a day (TID) | ORAL | Status: DC | PRN
  Administered 2021-02-19 – 2021-02-25 (×6): 2 mg via ORAL
  Filled 2021-02-19 (×6): qty 2

## 2021-02-19 MED ORDER — PREDNISONE 20 MG PO TABS
80.0000 mg | ORAL_TABLET | Freq: Every day | ORAL | Status: DC
  Administered 2021-02-19 – 2021-02-23 (×5): 80 mg via ORAL
  Filled 2021-02-19: qty 4
  Filled 2021-02-19: qty 1
  Filled 2021-02-19: qty 4
  Filled 2021-02-19 (×2): qty 1

## 2021-02-19 MED ORDER — SODIUM CHLORIDE 0.9% FLUSH
3.0000 mL | Freq: Two times a day (BID) | INTRAVENOUS | Status: DC
  Administered 2021-02-19 – 2021-02-25 (×11): 3 mL via INTRAVENOUS

## 2021-02-19 MED ORDER — ACETAMINOPHEN 650 MG RE SUPP: 650.0000 mg | Freq: Four times a day (QID) | RECTAL | Status: DC | PRN

## 2021-02-19 MED ORDER — LEVOTHYROXINE SODIUM 112 MCG PO TABS
112.0000 ug | ORAL_TABLET | Freq: Every day | ORAL | Status: DC
  Administered 2021-02-20 – 2021-02-25 (×6): 112 ug via ORAL
  Filled 2021-02-19 (×6): qty 1

## 2021-02-19 NOTE — ED Notes (Signed)
Pt signed blood consent form, did not have any questions or concerns

## 2021-02-19 NOTE — Plan of Care (Signed)
Its okay to give least incompatible emergency release blood.

## 2021-02-19 NOTE — ED Triage Notes (Addendum)
Patient c/o SOB and intermittent chest pain x 1 week. Paitent states she was having an anxiety attack when she arrived to the ED after c/o SOB and chest pain. Paiaent did states the anxiety is happening more frequent.  Patient added that she was itching on her back and sides x 1 week.

## 2021-02-19 NOTE — ED Provider Notes (Signed)
[facility]-EMERGENCY DEPT Provider Note   CSN: [id number] Arrival date & time: 02/19/21  0907     History Chief Complaint  Patient presents with  . Shortness of Breath  . Pruritis    [name] is a 75 y.o. female.  HPI    75 year old female comes in a chief complaint of shortness of breath and itching.  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  She reports that over the last 2 or 3 weeks she has started having some shortness of breath again.  She has been bleeding side effects of Xarelto, and suspicious of it being the cause.  She is also having itching over the back of the same duration.  She is unsure why she is having the itching, it is a new symptom for her.  In April patient was admitted to the hospital for severe anemia.  She was Hemoccult negative at that time and oncology team saw her.  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  She also required blood transfusion.  Patient denies any bloody stools, dark stools.  Past Medical History:  Diagnosis Date  . Anxiety   . Dysphagia   . HYPERTHYROIDISM   . LYMPHADENOPATHY   . MIGRAINE HEADACHE   . Neuromuscular disorder (HCC)    condition where muscle separates from bone left chest, quarter size, pain intermittent    Patient Active Problem List   Diagnosis Date Noted  . DVT (deep venous thrombosis) (HCC) 01/12/2021  . Pulmonary embolism (HCC) 01/12/2021  . Hypothyroidism 01/12/2021  . Anxiety 01/12/2021  . Cerebral thrombosis with cerebral infarction 01/06/2021  . Cerebral embolism with cerebral infarction 01/06/2021  . 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 Polyneuropathy 08/22/2020  . 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 PVC (premature ventricular contraction) 07/11/2013  . Dysphagia   . HYPERTHYROIDISM 07/17/2010  . MIGRAINE HEADACHE 07/17/2010   . LYMPHADENOPATHY 07/17/2010    Past Surgical History:  Procedure Laterality Date  . BALLOON DILATION N/A 09/18/2020   Procedure: BALLOON DILATION;  Surgeon: Kerin Salen, MD;  Location: [facility];  Service: Gastroenterology;  Laterality: N/A;  . ESOPHAGOGASTRODUODENOSCOPY (EGD) WITH PROPOFOL N/A 09/18/2020   Procedure: ESOPHAGOGASTRODUODENOSCOPY (EGD) WITH PROPOFOL;  Surgeon: Kerin Salen, MD;  Location: [facility];  Service: Gastroenterology;  Laterality: N/A;  . EYE SURGERY Bilateral    Cataract L eye 1-20, R eye 3-20   . FOREIGN BODY REMOVAL  09/18/2020   Procedure: FOREIGN BODY REMOVAL;  Surgeon: Kerin Salen, MD;  Location: [facility];  Service: Gastroenterology;;  . MOHS SURGERY  2001  . REVERSE SHOULDER ARTHROPLASTY Right 03/16/2019   Procedure: REVERSE SHOULDER ARTHROPLASTY;  Surgeon: Beverely Low, MD;  Location: [facility];  Service: Orthopedics;  Laterality: Right;  interscalene block  . TUBAL LIGATION       OB History   No obstetric history on file.     Family History  Problem Relation Age of Onset  . Heart disease Mother   . Stroke Father   . Arthritis Sister   . Diabetes Brother   . Arthritis Brother   . Neuropathy Brother   . Neuropathy Sister   . Diabetes Brother     Social History   Tobacco Use  . Smoking status: Former Smoker    Quit date: 1969    Years since quitting: 53.4  . Smokeless tobacco: Never Used  Vaping Use  .  Vaping Use: Never used  Substance Use Topics  . Alcohol use: No  . Drug use: No    Home Medications Prior to Admission medications   Medication Sig Start Date End Date Taking? Authorizing Provider  acetaminophen (TYLENOL) 500 MG tablet Take 1,000 mg by mouth every 6 (six) hours as needed for headache.   Yes [provider]  Calcium Carbonate+Vitamin D 600-200 MG-UNIT TABS Take 2 tablets by mouth 2 (two) times daily.   Yes [provider]  gabapentin (NEURONTIN) 300 MG capsule TAKE 1 CAPSULE BY MOUTH IN  THE  MORNING , 1 CAPSULE BY  MOUTH IN THE AFTERNOON ,  AND 2 CAPSULES BEFORE  BEDTIME Patient taking differently: Take 300 mg by mouth 4 (four) times daily. 02/17/21  Yes Anson FretAhern, Antonia B, MD  HYDROcodone-acetaminophen (NORCO/VICODIN) 5-325 MG tablet Take 1 tablet by mouth.   Yes [provider]  levothyroxine (SYNTHROID) 112 MCG tablet Take 112 mcg by mouth daily before breakfast.   Yes [provider]  LORazepam (ATIVAN) 2 MG tablet Take 2 mg by mouth 3 (three) times daily as needed for anxiety.   Yes [provider]  Multiple Vitamins-Minerals (MULTIVITAMIN WITH MINERALS) tablet Take 1 tablet by mouth daily.   Yes [provider]  pantoprazole (PROTONIX) 40 MG tablet Take 1 tablet (40 mg total) by mouth 2 (two) times daily. Patient taking differently: Take 40 mg by mouth 2 (two) times daily as needed (indigestion). 01/12/21  Yes Noralee Stainhoi, Jennifer, DO  polycarbophil (FIBERCON) 625 MG tablet Take 1,250 mg by mouth daily.   Yes [provider]  polyvinyl alcohol (LIQUIFILM TEARS) 1.4 % ophthalmic solution Place 1 drop into both eyes as needed for dry eyes.   Yes [provider]  rivaroxaban (XARELTO) 20 MG TABS tablet Take 1 tablet (20 mg total) by mouth daily with supper. 01/29/21  Yes Josph MachoEnnever, Peter R, MD  sertraline (ZOLOFT) 50 MG tablet Take 50 mg by mouth daily.   Yes [provider]  sucralfate (CARAFATE) 1 g tablet Take 1 g by mouth 2 (two) times daily.   Yes [provider]  famciclovir (FAMVIR) 500 MG tablet Take 1 tablet (500 mg total) by mouth daily. Patient not taking: Reported on 02/19/2021 01/13/21   Noralee Stainhoi, Jennifer, DO  fluconazole (DIFLUCAN) 100 MG tablet Take 1 tablet (100 mg total) by mouth daily. Patient not taking: Reported on 02/19/2021 01/13/21   Noralee Stainhoi, Jennifer, DO    Allergies    Aspirin, Cephalexin, Imipenem, Losartan potassium-hctz, Oxycodone, Statins, Clindamycin/lincomycin, Contrast media [iodinated diagnostic  agents], Penicillins, and Quinolones  Review of Systems   Review of Systems  Constitutional: Positive for activity change and fatigue.  Respiratory: Positive for shortness of breath.   Cardiovascular: Negative for chest pain.  Gastrointestinal: Negative for blood in stool.  Skin: Negative for rash.  Allergic/Immunologic: Negative for immunocompromised state.  All other systems reviewed and are negative.   Physical Exam Updated Vital Signs BP (!) 119/58   Pulse 81   Temp 98.5 F (36.9 C) (Oral)   Resp 19   Ht 5\' 5"  (1.651 m)   Wt 70.3 kg   SpO2 98%   BMI 25.79 kg/m   Physical Exam Vitals and nursing note reviewed.  Constitutional:      Appearance: She is well-developed.  HENT:     Head: Normocephalic and atraumatic.  Cardiovascular:     Rate and Rhythm: Normal rate.  Pulmonary:     Effort: Pulmonary effort is normal.  Abdominal:     General: Bowel sounds are normal.  Musculoskeletal:     Cervical back: Normal range of motion and neck supple.  Skin:    General: Skin is warm and dry.     Coloration: Skin is pale.  Neurological:     Mental Status: She is alert and oriented to person, place, and time.     ED Results / Procedures / Treatments   Labs (all labs ordered are listed, but only abnormal results are displayed) Labs Reviewed  BASIC METABOLIC PANEL - Abnormal; Notable for the following components:      Result Value   Potassium 3.4 (*)    Glucose, Bld 111 (*)    All other components within normal limits  CBC - Abnormal; Notable for the following components:   RBC 1.76 (*)    Hemoglobin 6.6 (*)    HCT 20.8 (*)    MCV 118.2 (*)    MCH 37.5 (*)    RDW 20.1 (*)    Platelets 415 (*)    nRBC 7.0 (*)    All other components within normal limits  IRON AND TIBC - Abnormal; Notable for the following components:   Saturation Ratios 41 (*)    All other components within normal limits  FERRITIN - Abnormal; Notable for the following components:   Ferritin 798  (*)    All other components within normal limits  RETICULOCYTES - Abnormal; Notable for the following components:   Retic Ct Pct 15.6 (*)    RBC. 1.71 (*)    Retic Count, Absolute 266.6 (*)    Immature Retic Fract 42.5 (*)    All other components within normal limits  SARS CORONAVIRUS 2 (TAT 6-24 HRS)  VITAMIN B12  FOLATE  HEPATIC FUNCTION PANEL  LACTATE DEHYDROGENASE  HAPTOGLOBIN  POC OCCULT BLOOD, ED  TYPE AND SCREEN  PREPARE RBC (CROSSMATCH)  TROPONIN I (HIGH SENSITIVITY)  TROPONIN I (HIGH SENSITIVITY)    EKG EKG Interpretation  Date/Time:  Thursday February 19 2021 09:24:55 EDT Ventricular Rate:  95 PR Interval:  155 QRS Duration: 82 QT Interval:  414 QTC Calculation: 521 R Axis:   -13 Text Interpretation: Sinus rhythm Low voltage, precordial leads Borderline T abnormalities, anterior leads Prolonged QT interval No acute changes No significant change since last tracing Confirmed by Derwood Kaplan ([id number]) on 02/19/2021 2:42:57 PM   Radiology DG Chest 2 View  Result Date: 02/19/2021 CLINICAL DATA:  Chest pain and shortness of breath EXAM: CHEST - 2 VIEW COMPARISON:  January 02, 2021 FINDINGS: There is minimal atelectasis in the left base. The lungs elsewhere are clear. Heart size and pulmonary vascularity are normal. There is aortic atherosclerosis. No adenopathy. 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 IMPRESSION: Minimal left base atelectasis. Lungs elsewhere clear. Heart size normal. Aortic Atherosclerosis (ICD10-I70.0). Electronically Signed   By: Bretta Bang III M.D.   On: 02/19/2021 09:38    Procedures .Critical Care Performed by: Derwood Kaplan, MD Authorized by: Derwood Kaplan, MD   Critical care provider statement:    Critical care time (minutes):  36   Critical care was necessary to treat or prevent imminent or life-threatening deterioration of the following conditions: Symptomatic anemia.   Critical care was time spent personally by me on the  following activities:  Discussions with consultants, evaluation of patient's response to treatment, examination of patient, ordering and performing treatments and interventions, ordering and review of laboratory studies, ordering and review of radiographic studies, pulse oximetry, re-evaluation  of patient's condition, obtaining history from patient or surrogate and review of old charts     Medications Ordered in ED Medications  0.9 %  sodium chloride infusion (has no administration in time range)    ED Course  I have reviewed the triage vital signs and the nursing notes.  Pertinent labs & imaging results that were available during my care of the patient were reviewed by me and considered in my medical decision making (see chart for details).    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  She is noted to have hemoglobin of 6.  She was discharged with a hemoglobin close to 12 last month.  At that time of admission, her hemoglobin was lower and she required transfusion.  She was noted to have hemolytic anemia and responded to IVIG and prednisone.  Hemoccult negative at that time, endoscopy last year did not reveal any severe disease of the upper GI tract and patient denies any bloody stools at this time.  My suspicion is that she is likely having the same process leading to anemia again.  We will consult medicine for admission.  She is also complaining of some itching, but does not appear to be an acute issue for [place] right now, we will manage the symptoms prn.  2:49 PM Medicine to admit.  They will consult heme.  Final Clinical Impression(s) / ED Diagnoses Final diagnoses:  Symptomatic anemia    Rx / DC Orders ED Discharge Orders    None       Derwood Kaplan, MD 02/19/21 1449

## 2021-02-19 NOTE — ED Provider Notes (Signed)
Emergency Medicine Provider Triage Evaluation Note  [name] , a 75 y.o. female  was evaluated in triage.  Pt complains of anxiety, chest pain, shortness of breath.  Patient is a difficult historian, anxious appearing.  Reports being started on Xarelto back in April for a stroke which has affected her memory.  She was doing some reading online yesterday about side effects of Xarelto which included chest pain, anxiety, shortness of breath which she all feels like she has been experiencing.  Having worsening shortness of breath and chest pain this morning.  She has been compliant with her Xarelto.  She called her PCPs office and was told to come here to be evaluated for a PE.  Denies any cough, fever, leg swelling  Review of Systems  Positive: As above Negative: As above  Physical Exam  BP 133/81 (BP Location: Left Arm)   Pulse (!) 105   Temp 98.5 F (36.9 C) (Oral)   Resp 18   Ht 5\' 5"  (1.651 m)   Wt 70.3 kg   SpO2 98%   BMI 25.79 kg/m  Gen:   Awake, no distress   Resp:  Normal effort  MSK:   Moves extremities without difficulty  Other:  Anxious appearing, tremulous speaking full sentences without increased work of breathing  Medical Decision Making  Medically screening exam initiated at 10:23 AM.  Appropriate orders placed.  was informed that the remainder of the evaluation will be completed by another provider, this initial triage assessment does not replace that evaluation, and the importance of remaining in the ED until their evaluation is complete.     Lona Kettle, PA-C 02/19/21 1024    04/21/21, MD 02/20/21 [phone]

## 2021-02-19 NOTE — Telephone Encounter (Signed)
Call received from patient stating that she is having SOB, difficulty taking deep breaths, NP cough and itching to her back and sides with no rash.  Patient instructed to go to the [facility] ER now per order of Dr. Myna Hidalgo.   Pt states that she will have her husband take her to the [facility] ER now. Dr. Myna Hidalgo notified.

## 2021-02-19 NOTE — H&P (Addendum)
History and Physical        Hospital Admission Note Date: 02/19/2021  Patient name: [name] Medical record number: [id number] Date of birth: [birth year] Age: 75 y.o. Gender: female  PCP: Burton Apleyoberts, Ronald, MD   Chief Complaint    Chief Complaint  Patient presents with  . Shortness of Breath  . 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  She says she has been in her usual state of health until her symptoms started recently.  Chest pain is noted with inspiration.  She is also had associated pruritus but no rash noted.  She has not noted any blood in her stool and has not had any emesis/hematemesis.  Otherwise no other complaints.     ED Course: Afebrile, hemodynamically stable, on room air. Notable Labs: Sodium 139, K3.4, hepatic function panel pending, Hb 6.6, platelets 415, elevated reticulocyte count, COVID-19 pending. Notable Imaging: CXR with minimal atelectasis.  2 units PRBCs ordered.    Vitals:   02/19/21 1330 02/19/21 1440  BP: 125/64 (!) 119/58  Pulse: 71 81  Resp: (!) 24 19  Temp:    SpO2: 100% 98%     Review of Systems:  Review of Systems  All other systems reviewed and are negative.   Medical/Social/Family History   Past Medical History: Past Medical History:  Diagnosis Date  . Anxiety   . Dysphagia   . HYPERTHYROIDISM   . LYMPHADENOPATHY   . MIGRAINE HEADACHE   . Neuromuscular disorder (HCC)    condition  where muscle separates from bone left chest, quarter size, pain intermittent    Past Surgical History:  Procedure Laterality Date  . BALLOON DILATION N/A 09/18/2020   Procedure: BALLOON DILATION;  Surgeon: Kerin SalenKarki, Arya, MD;  [facility];  Service: Gastroenterology;  Laterality: N/A;  . ESOPHAGOGASTRODUODENOSCOPY (EGD) WITH PROPOFOL N/A 09/18/2020   Procedure: ESOPHAGOGASTRODUODENOSCOPY (EGD) WITH PROPOFOL;  Surgeon: Kerin SalenKarki, Arya, MD;  Location: [facility];  Service: Gastroenterology;  Laterality: N/A;  . EYE SURGERY Bilateral    Cataract L eye 1-20, R eye 3-20   . FOREIGN BODY REMOVAL  09/18/2020   Procedure: FOREIGN BODY REMOVAL;  Surgeon: Kerin SalenKarki, Arya, MD;  Location: [facility];  Service: Gastroenterology;;  . MOHS SURGERY  2001  . REVERSE SHOULDER ARTHROPLASTY Right 03/16/2019   Procedure: REVERSE SHOULDER ARTHROPLASTY;  Surgeon: Beverely LowNorris, Steve, MD;  Location: [facility];  Service: Orthopedics;  Laterality: Right;  interscalene block  . TUBAL LIGATION      Medications: Prior to Admission medications   Medication Sig Start Date End Date Taking? Authorizing Provider  acetaminophen (TYLENOL) 500 MG tablet Take 1,000 mg by mouth every 6 (six) hours as needed for headache.   Yes [provider]  Calcium Carbonate+Vitamin D 600-200 MG-UNIT TABS Take 2 tablets by mouth 2 (two)  times daily.   Yes [provider]  gabapentin (NEURONTIN) 300 MG capsule TAKE 1 CAPSULE BY MOUTH IN  THE MORNING , 1 CAPSULE BY  MOUTH IN THE AFTERNOON ,  AND 2 CAPSULES BEFORE  BEDTIME Patient taking differently: Take 300 mg by mouth 4 (four) times daily. 02/17/21  Yes Anson Fret, MD  HYDROcodone-acetaminophen (NORCO/VICODIN) 5-325 MG tablet Take 1 tablet by mouth.   Yes [provider]  levothyroxine (SYNTHROID) 112 MCG tablet Take 112 mcg by mouth daily before breakfast.   Yes [provider]  LORazepam (ATIVAN) 2 MG tablet Take 2 mg by mouth 3 (three) times daily as  needed for anxiety.   Yes [provider]  Multiple Vitamins-Minerals (MULTIVITAMIN WITH MINERALS) tablet Take 1 tablet by mouth daily.   Yes [provider]  pantoprazole (PROTONIX) 40 MG tablet Take 1 tablet (40 mg total) by mouth 2 (two) times daily. Patient taking differently: Take 40 mg by mouth 2 (two) times daily as needed (indigestion). 01/12/21  Yes Noralee Stain, DO  polycarbophil (FIBERCON) 625 MG tablet Take 1,250 mg by mouth daily.   Yes [provider]  polyvinyl alcohol (LIQUIFILM TEARS) 1.4 % ophthalmic solution Place 1 drop into both eyes as needed for dry eyes.   Yes [provider]  rivaroxaban (XARELTO) 20 MG TABS tablet Take 1 tablet (20 mg total) by mouth daily with supper. 01/29/21  Yes Josph Macho, MD  sertraline (ZOLOFT) 50 MG tablet Take 50 mg by mouth daily.   Yes [provider]  sucralfate (CARAFATE) 1 g tablet Take 1 g by mouth 2 (two) times daily.   Yes [provider]  famciclovir (FAMVIR) 500 MG tablet Take 1 tablet (500 mg total) by mouth daily. Patient not taking: Reported on 02/19/2021 01/13/21   Noralee Stain, DO  fluconazole (DIFLUCAN) 100 MG tablet Take 1 tablet (100 mg total) by mouth daily. Patient not taking: Reported on 02/19/2021 01/13/21   Noralee Stain, DO    Allergies:   Allergies  Allergen Reactions  . Aspirin     Avoid due to stomach issues  . Cephalexin     Stomach cramps  . Imipenem     Warm and tingly all over  . Losartan Potassium-Hctz     Other reaction(s): do not remember  . Oxycodone     Hallucinations   . Statins     leg pain, muscle cramps  . Clindamycin/Lincomycin Nausea And Vomiting and Rash  . Contrast Media [Iodinated Diagnostic Agents] Rash  . Penicillins Rash    Did it involve swelling of the face/tongue/throat, SOB, or low BP? No Did it involve sudden or severe rash/hives, skin peeling, or any reaction on the inside of your mouth or nose? Yes Did you need to seek  medical attention at a hospital or doctor's office? No When did it last happen?2011 If all above answers are "NO", may proceed with cephalosporin use.   . Quinolones Rash    Social History:  reports that she quit smoking about 53 years ago. She has never used smokeless tobacco. She reports that she does not drink alcohol and does not use drugs.  Family History: Family History  Problem Relation Age of Onset  . Heart disease Mother   . Stroke Father   . Arthritis Sister   . Diabetes Brother   . Arthritis Brother   . Neuropathy Brother   . Neuropathy Sister   . Diabetes Brother  Objective   Physical Exam: Blood pressure (!) 119/58, pulse 81, temperature 98.5 F (36.9 C), temperature source Oral, resp. rate 19, height 5\' 5"  (1.651 m), weight 70.3 kg, SpO2 98 %.  Physical Exam Vitals and nursing note reviewed.  Constitutional:      Appearance: Normal appearance.  HENT:     Head: Normocephalic and atraumatic.  Eyes:     Conjunctiva/sclera: Conjunctivae normal.  Cardiovascular:     Rate and Rhythm: Normal rate and regular rhythm.  Pulmonary:     Effort: Pulmonary effort is normal.     Breath sounds: Normal breath sounds.  Abdominal:     General: Abdomen is flat.     Palpations: Abdomen is soft.  Musculoskeletal:        General: No swelling or tenderness.  Skin:    Coloration: Skin is pale. Skin is not jaundiced.  Neurological:     Mental Status: She is alert. Mental status is at baseline.  Psychiatric:        Mood and Affect: Mood is anxious.     LABS on Admission: I have personally reviewed all the labs and imaging below    Basic Metabolic Panel: Recent Labs  Lab 02/19/21 1125  NA 139  K 3.4*  CL 111  CO2 23  GLUCOSE 111*  BUN 11  CREATININE 0.47  CALCIUM 9.2   Liver Function Tests: No results for input(s): AST, ALT, ALKPHOS, BILITOT, PROT, ALBUMIN in the last 168 hours. No results for input(s): LIPASE, AMYLASE in the last 168 hours. No  results for input(s): AMMONIA in the last 168 hours. CBC: Recent Labs  Lab 02/19/21 1125  WBC 9.3  HGB 6.6*  HCT 20.8*  MCV 118.2*  PLT 415*   Cardiac Enzymes: No results for input(s): CKTOTAL, CKMB, CKMBINDEX, TROPONINI in the last 168 hours. BNP: Invalid input(s): POCBNP CBG: No results for input(s): GLUCAP in the last 168 hours.  Radiological Exams on Admission:  DG Chest 2 View  Result Date: 02/19/2021 CLINICAL DATA:  Chest pain and shortness of breath EXAM: CHEST - 2 VIEW COMPARISON:  January 02, 2021 FINDINGS: There is minimal atelectasis in the left base. The lungs elsewhere are clear. Heart size and pulmonary vascularity are normal. There is aortic atherosclerosis. No adenopathy. 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 IMPRESSION: Minimal left base atelectasis. Lungs elsewhere clear. Heart size normal. Aortic Atherosclerosis (ICD10-I70.0). Electronically Signed   By: January 04, 2021 III M.D.   On: 02/19/2021 09:38      EKG: normal EKG, normal sinus rhythm, PVCs noted on bedside telemetry   A & P   Principal Problem:   Symptomatic anemia Active Problems:   PVC (premature ventricular contraction)   Hemolytic anemia (HCC)   DVT (deep venous thrombosis) (HCC)   Hypothyroidism   Anxiety   1. Acute symptomatic anemia concern for recurrent idiopathic autoimmune hemolytic anemia a. Symptoms: Shortness of breath, pruritus, anxiety and intermittent chest pain (this is more likely related to anxiety) b. Hb 6.6 and hemodynamically stable on room air c. Reticulocyte count elevated d. FOBT, LFTs, LDH and haptoglobin pending e. Continue with 2 unit PRBC transfusion and IV fluids f. Oncology consulted, discussed with Dr. 04/21/2021 and okay to continue Xarelto for her DVT since she is not bleeding  2. Left popliteal DVT, diagnosed 01/06/2021 a. Okay to continue Xarelto per hematology/oncology as above  3. Anxiety a. Continue home Zoloft and as needed Ativan and  give a dose now  4. Polyneuropathy a.  Continue home gabapentin  5. Hypothyroidism a. Continue home Synthroid  6. Recent history of ischemic stroke thought to be secondary to anemia a. Monitor closely for any neurologic changes in the setting of recurrent anemia  7. Hypokalemia a. Replace and check Mg    DVT prophylaxis: Xarelto   Code Status: Full Code  Diet: Regular Family Communication: Admission, patients condition and plan of care including tests being ordered have been discussed with the patient who indicates understanding and agrees with the plan and Code Status. Patient's son was updated  Disposition Plan: The appropriate patient status for this patient is INPATIENT. Inpatient status is judged to be reasonable and necessary in order to provide the required intensity of service to ensure the patient's safety. The patient's presenting symptoms, physical exam findings, and initial radiographic and laboratory data in the context of their chronic comorbidities is felt to place them at high risk for further clinical deterioration. Furthermore, it is not anticipated that the patient will be medically stable for discharge from the hospital within 2 midnights of admission. The following factors support the patient status of inpatient.   " The patient's presenting symptoms include anxiety, shortness of breath, itching. " The worrisome physical exam findings include pallor. " The initial radiographic and laboratory data are worrisome because of anemia. " The chronic co-morbidities include hemolytic anemia.   * I certify that at the point of admission it is my clinical judgment that the patient will require inpatient hospital care spanning beyond 2 midnights from the point of admission due to high intensity of service, high risk for further deterioration and high frequency of surveillance required.*     The medical decision making on this patient was of high complexity and the patient is  at high risk for clinical deterioration, therefore this is a level 3  admission.  Consultants  . Oncology  Procedures  . PRBC transfusion  Time Spent on Admission: 70 minutes    Jae Dire, DO [facility]  02/19/2021, 3:28 PM

## 2021-02-20 ENCOUNTER — Inpatient Hospital Stay (HOSPITAL_COMMUNITY): Payer: Medicare Other

## 2021-02-20 DIAGNOSIS — I82451 Acute embolism and thrombosis of right peroneal vein: Secondary | ICD-10-CM

## 2021-02-20 DIAGNOSIS — D5911 Warm autoimmune hemolytic anemia: Principal | ICD-10-CM

## 2021-02-20 LAB — BASIC METABOLIC PANEL
Anion gap: 8 (ref 5–15)
BUN: 9 mg/dL (ref 8–23)
CO2: 22 mmol/L (ref 22–32)
Calcium: 8.3 mg/dL — ABNORMAL LOW (ref 8.9–10.3)
Chloride: 111 mmol/L (ref 98–111)
Creatinine, Ser: 0.53 mg/dL (ref 0.44–1.00)
GFR, Estimated: >60 mL/min (ref >60)
Glucose, Bld: 160 mg/dL — ABNORMAL HIGH (ref 70–99)
Potassium: 4.1 mmol/L (ref 3.5–5.1)
Sodium: 141 mmol/L (ref 135–145)

## 2021-02-20 LAB — MAGNESIUM: Magnesium: 2.4 mg/dL (ref 1.7–2.4)

## 2021-02-20 LAB — RETICULOCYTES
Immature Retic Fract: 41 % — ABNORMAL HIGH (ref 2.3–15.9)
RBC.: 2.46 MIL/uL — ABNORMAL LOW (ref 3.87–5.11)
Retic Count, Absolute: 263.7 10*3/uL — ABNORMAL HIGH (ref 19.0–186.0)
Retic Ct Pct: 10.7 % — ABNORMAL HIGH (ref 0.4–3.1)

## 2021-02-20 LAB — CBC
HCT: 27.5 % — ABNORMAL LOW (ref 36.0–46.0)
Hemoglobin: 9.1 g/dL — ABNORMAL LOW (ref 12.0–15.0)
MCH: 36.7 pg — ABNORMAL HIGH (ref 26.0–34.0)
MCHC: 33.1 g/dL (ref 30.0–36.0)
MCV: 110.9 fL — ABNORMAL HIGH (ref 80.0–100.0)
Platelets: 351 10*3/uL (ref 150–400)
RBC: 2.48 MIL/uL — ABNORMAL LOW (ref 3.87–5.11)
RDW: 20.9 % — ABNORMAL HIGH (ref 11.5–15.5)
WBC: 9.7 10*3/uL (ref 4.0–10.5)
nRBC: 4.5 % — ABNORMAL HIGH (ref 0.0–0.2)

## 2021-02-20 LAB — LACTATE DEHYDROGENASE: LDH: 481 U/L — ABNORMAL HIGH (ref 98–192)

## 2021-02-20 LAB — HAPTOGLOBIN: Haptoglobin: <10 mg/dL — ABNORMAL LOW (ref 42–346)

## 2021-02-20 MED ORDER — IMMUNE GLOBULIN (HUMAN) 10 GM/100ML IV SOLN
1.0000 g/kg | INTRAVENOUS | Status: AC
  Administered 2021-02-20 – 2021-02-21 (×2): 70 g via INTRAVENOUS
  Filled 2021-02-20: qty 600
  Filled 2021-02-20: qty 700
  Filled 2021-02-20: qty 600

## 2021-02-20 NOTE — Hospital Course (Addendum)
75 year old female with past medical history of hypothyroidism, anxiety, idiopathic warm autoimmune hemolytic anemia, DVT of the R peroneal vein on Xarelto with recent hospitalization from 4/15-4/25 at which point she was diagnosed with autoimmune hemolytic anemia,  treated with prednisone and IVIG with excellent response (Hbg 12.7 at hematology follow up 3 weeks ago).  She  unfortunately had an acute ischemic stroke thought to be due to her anemia.    She presented to [facility] ED with anxiety, progressive shortness of breath and intermittent chest pain x1 week.  Found to have Hbg of 6.6, retic cout 15.6%, Tbili 2.3 and LDH 481.    Admitted for further management of recurrent warm autoimmune hemolytic anemia.   Transfused 2 units pRBC's on admission. Dr. Myna Hidalgo with hematology is following.  Patient started on prednisone and IVIG.

## 2021-02-20 NOTE — ED Notes (Signed)
US at bedside

## 2021-02-20 NOTE — Plan of Care (Signed)

## 2021-02-20 NOTE — Consult Note (Signed)
Referral MD  Reason for Referral: Relapse of warm autoimmune hemolytic anemia  Chief Complaint  Patient presents with  . Shortness of Breath  . Pruritis  : I had trouble breathing.  HPI: [name] is well-known to me.  [name] is a very charming 75 year old white female.  [name] presented back in May with autoimmune hemolytic anemia.  This was a warm autoimmune hemolytic anemia.  [name] was treated with prednisone and IVIG.  [name] had a very nice response.  We saw in the office about 3 weeks ago.  [name] hemoglobin was 12.7.  We were tapering down the prednisone.  [name] called the office this morning saying that [name] was more short of breath.  We had to go to the emergency room.  I was worried about the possibility of a relapse of the warm autoimmune hemolytic anemia.  Unfortunately, we were correct.  [name] had a hemoglobin of 6.7.  Reticulocyte count was about 16%.  [name] bilirubin was 2.3.  LDH was 481.  [name] has had 2 units of blood cells.  This morning, [name] hemoglobin is 9.1.  [name] reticulocyte count was 10.7%.  We have restarted [name] on prednisone.  We will also give [name] IVIG.  [name] does have diabetes.  We will have to watch out for the hyperglycemia..  [name] does have history of CVA.  [name] has history of DVT.  [name] is on Xarelto.  While [name] is in the hospital, we we will go ahead and repeat [name] Dopplers and see how everything looks.  [name] has had no fever.  [name] has had no rashes.  There is no swollen lymph nodes.  [name] has had no change in bowel or bladder habits.  Currently, [name] performance status is ECOG 1.     Past Medical History:  Diagnosis Date  . Anxiety   . Dysphagia   . HYPERTHYROIDISM   . LYMPHADENOPATHY   . MIGRAINE HEADACHE   . Neuromuscular disorder (HCC)    condition where muscle separates from bone left chest, quarter size, pain intermittent  :  Past Surgical History:  Procedure Laterality Date  . BALLOON DILATION N/A 09/18/2020   Procedure: BALLOON DILATION;  Surgeon: Kerin Salen,  MD;  Location: [facility];  Service: Gastroenterology;  Laterality: N/A;  . ESOPHAGOGASTRODUODENOSCOPY (EGD) WITH PROPOFOL N/A 09/18/2020   Procedure: ESOPHAGOGASTRODUODENOSCOPY (EGD) WITH PROPOFOL;  Surgeon: Kerin Salen, MD;  Location: [facility];  Service: Gastroenterology;  Laterality: N/A;  . EYE SURGERY Bilateral    Cataract L eye 1-20, R eye 3-20   . FOREIGN BODY REMOVAL  09/18/2020   Procedure: FOREIGN BODY REMOVAL;  Surgeon: Kerin Salen, MD;  Location: [facility];  Service: Gastroenterology;;  . MOHS SURGERY  2001  . REVERSE SHOULDER ARTHROPLASTY Right 03/16/2019   Procedure: REVERSE SHOULDER ARTHROPLASTY;  Surgeon: Beverely Low, MD;  Location: [facility];  Service: Orthopedics;  Laterality: Right;  interscalene block  . TUBAL LIGATION    :   Current Facility-Administered Medications:  .  0.9 %  sodium chloride infusion, 10 mL/hr, Intravenous, Once, Jae Dire, MD .  acetaminophen (TYLENOL) tablet 650 mg, 650 mg, Oral, Q6H PRN **OR** acetaminophen (TYLENOL) suppository 650 mg, 650 mg, Rectal, Q6H PRN, Jae Dire, MD .  fluconazole (DIFLUCAN) tablet 100 mg, 100 mg, Oral, Daily, Josph Macho, MD, 100 mg at 02/19/21 1806 .  folic acid (FOLVITE) tablet 2 mg, 2 mg, Oral, Daily, Chason Mciver, Rose Phi, MD, 2 mg at 02/19/21 1757 .  gabapentin (NEURONTIN) capsule 300  mg, 300 mg, Oral, QID, Jae DireSegal, Jared E, MD, 300 mg at 02/19/21 2208 .  levothyroxine (SYNTHROID) tablet 112 mcg, 112 mcg, Oral, QAC breakfast, Jae DireSegal, Jared E, MD .  LORazepam (ATIVAN) tablet 2 mg, 2 mg, Oral, TID PRN, Jae DireSegal, Jared E, MD, 2 mg at 02/19/21 1531 .  pantoprazole (PROTONIX) EC tablet 40 mg, 40 mg, Oral, BID PRN, Jae DireSegal, Jared E, MD .  polyethylene glycol (MIRALAX / GLYCOLAX) packet 17 g, 17 g, Oral, Daily PRN, Jae DireSegal, Jared E, MD .  predniSONE (DELTASONE) tablet 80 mg, 80 mg, Oral, Q breakfast, Josph MachoEnnever, Alezander Dimaano R, MD, 80 mg at 02/19/21 1846 .  rivaroxaban (XARELTO) tablet 20 mg, 20 mg, Oral, Q supper, Jae DireSegal, Jared  E, MD, 20 mg at 02/19/21 1806 .  sertraline (ZOLOFT) tablet 50 mg, 50 mg, Oral, Daily, Whitney PostSegal, Jared E, MD .  sodium chloride flush (NS) 0.9 % injection 3 mL, 3 mL, Intravenous, Q12H, Jae DireSegal, Jared E, MD, 3 mL at 02/19/21 2214  Current Outpatient Medications:  .  acetaminophen (TYLENOL) 500 MG tablet, Take 1,000 mg by mouth every 6 (six) hours as needed for headache., Disp: , Rfl:  .  Calcium Carbonate+Vitamin D 600-200 MG-UNIT TABS, Take 2 tablets by mouth 2 (two) times daily., Disp: , Rfl:  .  gabapentin (NEURONTIN) 300 MG capsule, TAKE 1 CAPSULE BY MOUTH IN  THE MORNING , 1 CAPSULE BY  MOUTH IN THE AFTERNOON ,  AND 2 CAPSULES BEFORE  BEDTIME (Patient taking differently: Take 300 mg by mouth 4 (four) times daily.), Disp: 360 capsule, Rfl: 0 .  HYDROcodone-acetaminophen (NORCO/VICODIN) 5-325 MG tablet, Take 1 tablet by mouth., Disp: , Rfl:  .  levothyroxine (SYNTHROID) 112 MCG tablet, Take 112 mcg by mouth daily before breakfast., Disp: , Rfl:  .  LORazepam (ATIVAN) 2 MG tablet, Take 2 mg by mouth 3 (three) times daily as needed for anxiety., Disp: , Rfl:  .  Multiple Vitamins-Minerals (MULTIVITAMIN WITH MINERALS) tablet, Take 1 tablet by mouth daily., Disp: , Rfl:  .  pantoprazole (PROTONIX) 40 MG tablet, Take 1 tablet (40 mg total) by mouth 2 (two) times daily. (Patient taking differently: Take 40 mg by mouth 2 (two) times daily as needed (indigestion).), Disp: 60 tablet, Rfl: 0 .  polycarbophil (FIBERCON) 625 MG tablet, Take 1,250 mg by mouth daily., Disp: , Rfl:  .  polyvinyl alcohol (LIQUIFILM TEARS) 1.4 % ophthalmic solution, Place 1 drop into both eyes as needed for dry eyes., Disp: , Rfl:  .  rivaroxaban (XARELTO) 20 MG TABS tablet, Take 1 tablet (20 mg total) by mouth daily with supper., Disp: 90 tablet, Rfl: 2 .  sertraline (ZOLOFT) 50 MG tablet, Take 50 mg by mouth daily., Disp: , Rfl:  .  sucralfate (CARAFATE) 1 g tablet, Take 1 g by mouth 2 (two) times daily., Disp: , Rfl:  .   famciclovir (FAMVIR) 500 MG tablet, Take 1 tablet (500 mg total) by mouth daily. (Patient not taking: Reported on 02/19/2021), Disp: 30 tablet, Rfl: 0 .  fluconazole (DIFLUCAN) 100 MG tablet, Take 1 tablet (100 mg total) by mouth daily. (Patient not taking: Reported on 02/19/2021), Disp: 30 tablet, Rfl: 0:  . fluconazole  100 mg Oral Daily  . folic acid  2 mg Oral Daily  . gabapentin  300 mg Oral QID  . levothyroxine  112 mcg Oral QAC breakfast  . predniSONE  80 mg Oral Q breakfast  . rivaroxaban  20 mg Oral Q supper  . sertraline  50  mg Oral Daily  . sodium chloride flush  3 mL Intravenous Q12H  :  Allergies  Allergen Reactions  . Aspirin     Avoid due to stomach issues  . Cephalexin     Stomach cramps  . Imipenem     Warm and tingly all over  . Losartan Potassium-Hctz     Other reaction(s): do not remember  . Oxycodone     Hallucinations   . Statins     leg pain, muscle cramps  . Clindamycin/Lincomycin Nausea And Vomiting and Rash  . Contrast Media [Iodinated Diagnostic Agents] Rash  . Penicillins Rash    Did it involve swelling of the face/tongue/throat, SOB, or low BP? No Did it involve sudden or severe rash/hives, skin peeling, or any reaction on the inside of your mouth or nose? Yes Did you need to seek medical attention at a hospital or doctor's office? No When did it last happen?2011 If all above answers are "NO", may proceed with cephalosporin use.   . Quinolones Rash  :  Family History  Problem Relation Age of Onset  . Heart disease Mother   . Stroke Father   . Arthritis Sister   . Diabetes Brother   . Arthritis Brother   . Neuropathy Brother   . Neuropathy Sister   . Diabetes Brother   :  Social History   Socioeconomic History  . Marital status: Married    Spouse name: Not on file  . Number of children: Not on file  . Years of education: Not on file  . Highest education level: Not on file  Occupational History  . Not on file  Tobacco Use  .  Smoking status: Former Smoker    Quit date: 1969    Years since quitting: 53.4  . Smokeless tobacco: Never Used  Vaping Use  . Vaping Use: Never used  Substance and Sexual Activity  . Alcohol use: No  . Drug use: No  . Sexual activity: Not on file  Other Topics Concern  . Not on file  Social History Narrative  . Not on file   Social Determinants of Health   Financial Resource Strain: Not on file  Food Insecurity: Not on file  Transportation Needs: Not on file  Physical Activity: Not on file  Stress: Not on file  Social Connections: Not on file  Intimate Partner Violence: Not on file  :  Review of Systems  Constitutional: Positive for malaise/fatigue. Negative for diaphoresis.  HENT: Negative.   Eyes: Negative.   Respiratory: Positive for shortness of breath.   Cardiovascular: Positive for chest pain.  Gastrointestinal: Negative.   Genitourinary: Negative.   Musculoskeletal: Positive for myalgias.  Skin: Positive for itching.  Neurological: Negative.   Endo/Heme/Allergies: Negative.   Psychiatric/Behavioral: Negative.      Exam:  Physical Exam Vitals reviewed.  HENT:     Head: Normocephalic and atraumatic.  Eyes:     Pupils: Pupils are equal, round, and reactive to light.  Cardiovascular:     Rate and Rhythm: Normal rate and regular rhythm.     Heart sounds: Normal heart sounds.  Pulmonary:     Effort: Pulmonary effort is normal.     Breath sounds: Normal breath sounds.  Abdominal:     General: Bowel sounds are normal.     Palpations: Abdomen is soft.  Musculoskeletal:        General: No tenderness or deformity. Normal range of motion.     Cervical back: Normal range  of motion.  Lymphadenopathy:     Cervical: No cervical adenopathy.  Skin:    General: Skin is warm and dry.     Findings: No erythema or rash.  Neurological:     Mental Status: [name] is alert and oriented to person, place, and time.  Psychiatric:        Behavior: Behavior normal.         Thought Content: Thought content normal.        Judgment: Judgment normal.    Patient Vitals for the past 24 hrs:  BP Temp Temp src Pulse Resp SpO2 Height Weight  02/20/21 0530 112/62 -- -- 78 15 99 % -- --  02/20/21 0516 112/63 -- -- 73 18 99 % -- --  02/20/21 0430 106/64 -- -- 74 (!) 24 96 % -- --  02/20/21 0400 99/64 -- -- 78 (!) 25 94 % -- --  02/20/21 0300 108/62 98.4 F (36.9 C) -- 76 17 94 % -- --  02/20/21 0246 115/66 98.4 F (36.9 C) Oral 77 16 -- -- --  02/20/21 0130 (!) 112/58 98.4 F (36.9 C) -- 85 18 93 % -- --  02/20/21 0112 -- -- Oral 87 16 -- -- --  02/20/21 0110 124/63 -- -- 89 18 97 % -- --  02/19/21 2300 124/70 -- -- 91 (!) 27 91 % -- --  02/19/21 2230 (!) 116/57 -- -- 100 (!) 21 97 % -- --  02/19/21 1930 (!) 105/58 -- -- 96 (!) 21 97 % -- --  02/19/21 1815 -- -- -- 94 14 97 % -- --  02/19/21 1800 113/62 -- -- 96 19 97 % -- --  02/19/21 1745 -- -- -- 99 15 96 % -- --  02/19/21 1730 (!) 94/43 -- -- 92 17 99 % -- --  02/19/21 1715 -- -- -- 86 12 100 % -- --  02/19/21 1700 (!) 85/12 -- -- 91 (!) 21 98 % -- --  02/19/21 1645 -- -- -- 98 19 96 % -- --  02/19/21 1630 109/63 -- -- 85 20 98 % -- --  02/19/21 1615 -- -- -- 93 (!) 21 98 % -- --  02/19/21 1600 103/86 -- -- 92 19 96 % -- --  02/19/21 1545 -- -- -- 88 16 98 % -- --  02/19/21 1530 (!) 118/101 -- -- (!) 101 20 98 % -- --  02/19/21 1515 -- -- -- 100 (!) 25 99 % -- --  02/19/21 1500 (!) 126/46 -- -- (!) 101 (!) 25 100 % -- --  02/19/21 1445 -- -- -- 84 18 100 % -- --  02/19/21 1440 (!) 119/58 -- -- 81 19 98 % -- --  02/19/21 1330 125/64 -- -- 71 (!) 24 100 % -- --  02/19/21 1300 130/67 -- -- 72 16 100 % -- --  02/19/21 1230 136/75 -- -- 87 16 98 % -- --  02/19/21 1207 (!) 131/51 -- -- 78 16 100 % -- --  02/19/21 0924 -- -- -- -- -- -- 5\' 5"  (1.651 m) 70.3 kg  02/19/21 0918 133/81 98.5 F (36.9 C) Oral (!) 105 18 98 % -- --     Recent Labs    02/19/21 1125 02/20/21 0510  WBC 9.3 9.7  HGB 6.6* 9.1*   HCT 20.8* 27.5*  PLT 415* 351   Recent Labs    02/19/21 1125 02/20/21 0510  NA 139 141  K 3.4* 4.1  CL 111  111  CO2 23 22  GLUCOSE 111* 160*  BUN 11 9  CREATININE 0.47 0.53  CALCIUM 9.2 8.3*    Blood smear review: None  Pathology: None    Assessment and Plan: [name] is a 75 year old white female with 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  We did a fairly thorough work-up on [name] when [name] first presented.  I think we will have to slowly taper the prednisone.  I think that we may have to consider using Rituxan.  Ultimately, a splenectomy probably would help the situation.  However, this would be invasive and would be the therapy of last resort in my opinion.  We will go ahead and give [name] IVIG.  [name] does have the thromboembolic issue.  We will see about doing a Doppler to see how [name] is doing with respect to the Xarelto.  I just hate the fact that [name] has had this relapse.  I would like to leave that with a second treatment of prednisone and IVIG along with Rituxan, we can get this in remission and keep [name] there.  I suspect that [name] will be in the hospital for a few days.  We will follow along and help out any way that we can.  I appreciate the outstanding care that [name] will get from all the staff while [name] is at [facility].  Christin Bach, MD  Ex[place] 14:14

## 2021-02-20 NOTE — ED Notes (Signed)
Per pharmacy - IVIG to be delivered to floor at 2pm for IV team to administer

## 2021-02-20 NOTE — Progress Notes (Signed)
IVT consulted for IVIG.  Primary RN, Ugomma advised vitals must be completed w/in 15 mins of hanging.  Arrived at patients room and vitals have not be assessed, yet.  Will return once in flowsheet.

## 2021-02-20 NOTE — Progress Notes (Signed)
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  She  unfortunately had an acute ischemic stroke thought to be due to her anemia.    She presented to [facility] ED with anxiety, progressive shortness of breath and intermittent chest pain x1 week.  Found to have Hbg of 6.6, retic cout 15.6%, Tbili 2.3 and LDH 481.    Admitted for further management of recurrent warm autoimmune hemolytic anemia.   Transfused 2 units pRBC's on admission. Dr. Myna Hidalgo with hematology is following.  Patient started on prednisone and IVIG.   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  Presented with Hbg 6.6, down from 12.7 on 01/29/21. --Dr. Myna Hidalgo with heme/oncology following --Started on prednisone, IVIG --Considering Rituxan --Transfused 2 units pRBC's on admission --Trend Hbg, transfuse if <7.0   Left popliteal vein DVT - diagnosed 01/06/21, on Xarelto. --Continue Xarelto --Follow up repeat doppler u/s to reassess  Anxiety with panic attacks - cont home Zoloft.  Ativan PRN.  Polyneuropathy - cont gabapentin  Hypothyroidism - cont Synthroid  Recent hx of acute ischemic CVA - thought due to severe anemia.  Very mild word finding issues, working with [facility] SLP.   --Monitor closely  Hypokalemia - replaced.  Monitor BMP and replace PRN.    Patient BMI: Body mass index is 25.79 kg/m.   DVT prophylaxis:  rivaroxaban (XARELTO) tablet 20 mg   Diet:  Diet Orders (From admission, onward)    Start     Ordered   02/19/21 1512  Diet regular Room service appropriate? Yes; Fluid consistency: Thin  Diet effective now       Question Answer Comment  Room service appropriate? Yes   Fluid consistency: Thin      02/19/21 1511            Code Status: Full Code    Subjective 02/20/21    Pt seen in ED while holding for a bed.  She was just recovering from a panic attack.  We discussed this recurrent illness.  Her husband and son from [place] coming to visit.  Feeling better after blood transfusions.     Disposition Plan & Communication   Status is: Inpatient  Remains inpatient appropriate because:IV treatments appropriate due to intensity of illness or inability to take PO and Inpatient level of care appropriate due to severity of illness   Dispo: The patient is from: Home              Anticipated d/c is to: Home              Patient currently is not medically stable to d/c.   Difficult to place patient No    Consults, Procedures, Significant Events   Consultants:   Hematology/oncology-Dr. Myna Hidalgo  Procedures:   Transfusion  of packed red blood cells  Antimicrobials:  Anti-infectives (From admission, onward)   Start     Dose/Rate Route Frequency Ordered Stop   02/19/21 1745  fluconazole (DIFLUCAN) tablet 100 mg        100 mg Oral Daily 02/19/21 1743          Micro    Objective   Vitals:   02/20/21 0400 02/20/21 0430 02/20/21 0516 02/20/21 0530  BP: 99/64 106/64 112/63 112/62  Pulse: 78 74 73 78  Resp: (!) 25 (!) 24 18 15   Temp:      TempSrc:      SpO2: 94% 96% 99% 99%  Weight:      Height:        Intake/Output Summary (Last 24 hours) at 02/20/2021 0740 Last data filed at 02/20/2021 0438 Gross per 24 hour  Intake  1630 ml  Output --  Net 1630 ml   Filed Weights   02/19/21 0924  Weight: 70.3 kg    Physical Exam:  General exam: awake, alert, no acute distress HEENT: atraumatic, clear conjunctiva, anicteric sclera, moist mucus membranes, hearing grossly normal  Respiratory system: CTAB, no wheezes, rales or rhonchi, normal respiratory effort. Cardiovascular system: normal S1/S2, RRR, no pedal edema.   Central nervous system: A&O x4. no gross focal neurologic deficits, normal speech Extremities: moves all no edema, normal tone Skin: dry, intact, normal temperature Psychiatry: Anxious mood, congruent affect, judgement and insight appear normal  Labs   Data Reviewed: I have personally reviewed following labs and imaging studies  CBC: Recent Labs  Lab 02/19/21 1125 02/20/21 0510  WBC 9.3 9.7  HGB 6.6* 9.1*  HCT 20.8* 27.5*  MCV 118.2* 110.9*  PLT 415* 351   Basic Metabolic Panel: Recent Labs  Lab 02/19/21 1125 02/19/21 1802 02/20/21 0510  NA 139  --  141  K 3.4*  --  4.1  CL 111  --  111  CO2 23  --  22  GLUCOSE 111*  --  160*  BUN 11  --  9  CREATININE 0.47  --  0.53  CALCIUM 9.2  --  8.3*  MG  --  1.6*  --    GFR: Estimated Creatinine Clearance: 62.5 mL/min (by C-G formula based on SCr of 0.53 mg/dL). Liver Function Tests: Recent Labs  Lab 02/19/21 1802  AST 38  ALT 20  ALKPHOS 39  BILITOT 2.3*  PROT 6.0*  ALBUMIN 3.4*   No results for input(s): LIPASE, AMYLASE in the last 168 hours. No results for input(s): AMMONIA in the last 168 hours. Coagulation Profile: No results for input(s): INR, PROTIME in the last 168 hours. Cardiac Enzymes: No results for input(s): CKTOTAL, CKMB, CKMBINDEX, TROPONINI in the last 168 hours. BNP (last 3 results) No results for input(s): PROBNP in the last 8760 hours. HbA1C: No results for input(s): HGBA1C in the last 72 hours. CBG: No results for input(s): GLUCAP in the last 168 hours. Lipid Profile: No results for input(s):  CHOL, HDL, LDLCALC, TRIG, CHOLHDL, LDLDIRECT in the last 72 hours. Thyroid Function Tests: No results for input(s): TSH, T4TOTAL, FREET4, T3FREE, THYROIDAB in the last 72 hours. Anemia Panel: Recent Labs    02/19/21 1311 02/20/21 0510  VITAMINB12 479  --   FOLATE 37.6  --   FERRITIN 798*  --   TIBC 304  --   IRON 125  --   RETICCTPCT 15.6* 10.7*   Sepsis Labs: No results for input(s): PROCALCITON, LATICACIDVEN in the last 168  hours.  Recent Results (from the past 240 hour(s))  SARS CORONAVIRUS 2 (TAT 6-24 HRS) Nasopharyngeal Nasopharyngeal Swab     Status: None   Collection Time: 02/19/21 12:48 PM   Specimen: Nasopharyngeal Swab  Result Value Ref Range Status   SARS Coronavirus 2 NEGATIVE NEGATIVE Final    Comment: (NOTE) SARS-CoV-2 target nucleic acids are NOT DETECTED.  The SARS-CoV-2 RNA is generally detectable in upper and lower respiratory specimens during the acute phase of infection. Negative results do not preclude SARS-CoV-2 infection, do not rule out co-infections with other pathogens, and should not be used as the sole basis for treatment or other patient management decisions. Negative results must be combined with clinical observations, patient history, and epidemiological information. The expected result is Negative.  Fact Sheet for Patients: HairSlick.no  Fact Sheet for Healthcare Providers: quierodirigir.com  This test is not yet approved or cleared by the [place] FDA and  has been authorized for detection and/or diagnosis of SARS-CoV-2 by FDA under an Emergency Use Authorization (EUA). This EUA will remain  in effect (meaning this test can be used) for the duration of the COVID-19 declaration under Se ction 564(b)(1) of the Act, 21 U.S.C. section 360bbb-3(b)(1), unless the authorization is terminated or revoked sooner.  Performed at [facility], [address] [place],  [place] [address]       Imaging Studies   DG Chest 2 View  Result Date: 02/19/2021 CLINICAL DATA:  Chest pain and shortness of breath EXAM: CHEST - 2 VIEW COMPARISON:  January 02, 2021 FINDINGS: There is minimal atelectasis in the left base. The lungs elsewhere are clear. Heart size and pulmonary vascularity are normal. There is aortic atherosclerosis. No adenopathy. 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 IMPRESSION: Minimal left base atelectasis. Lungs elsewhere clear. Heart size normal. Aortic Atherosclerosis (ICD10-I70.0). Electronically Signed   By: Bretta Bang III M.D.   On: 02/19/2021 09:38     Medications   Scheduled Meds: . fluconazole  100 mg Oral Daily  . folic acid  2 mg Oral Daily  . gabapentin  300 mg Oral QID  . levothyroxine  112 mcg Oral QAC breakfast  . predniSONE  80 mg Oral Q breakfast  . rivaroxaban  20 mg Oral Q supper  . sertraline  50 mg Oral Daily  . sodium chloride flush  3 mL Intravenous Q12H   Continuous Infusions: . sodium chloride    . IMMUNE GLOBULIN 10% (HUMAN) IV - For Fluid Restriction Only         LOS: 1 day    Time spent: 30 minutes    Pennie Banter, DO [facility]  02/20/2021, 7:40 AM      If 7PM-7AM, please contact night-coverage. How to contact the [facility] Attending or Consulting provider 7A - 7P or covering provider during after hours 7P -7A, for this patient?    1. Check the care team in [facility] and look for a) attending/consulting [facility] provider listed and b) the [facility] team listed 2. Log into [email or web address] and use [place]'s universal password to access. If you do not have the password, please contact the hospital operator. 3. Locate the [facility] provider you are looking for under [facility] and page to a number that you can be directly reached. 4. If you still have difficulty reaching the provider, please page the [facility] (Director on Call) for the Hospitalists listed on amion for assistance.

## 2021-02-20 NOTE — Progress Notes (Signed)
VASCULAR LAB    Right lower extremity venous duplex has been performed.  See CV proc for preliminary results.   Donn Wilmot, RVT 02/20/2021, 11:18 AM

## 2021-02-20 NOTE — ED Notes (Signed)
Breakfast tray provided. 

## 2021-02-20 NOTE — ED Notes (Signed)
Lunch tray provided. 

## 2021-02-20 NOTE — Progress Notes (Signed)
I received a consult that patient requested prayer.  I provided prayer and ministry of presence to her and her son and husband.  Chaplain Dyanne Carrel, Bcc Pager, [phone] 4:40 PM

## 2021-02-21 LAB — BASIC METABOLIC PANEL
Anion gap: 7 (ref 5–15)
BUN: 16 mg/dL (ref 8–23)
CO2: 21 mmol/L — ABNORMAL LOW (ref 22–32)
Calcium: 8.4 mg/dL — ABNORMAL LOW (ref 8.9–10.3)
Chloride: 111 mmol/L (ref 98–111)
Creatinine, Ser: 0.59 mg/dL (ref 0.44–1.00)
GFR, Estimated: >60 mL/min (ref >60)
Glucose, Bld: 93 mg/dL (ref 70–99)
Potassium: 3.7 mmol/L (ref 3.5–5.1)
Sodium: 139 mmol/L (ref 135–145)

## 2021-02-21 LAB — HEMOGLOBIN AND HEMATOCRIT, BLOOD
HCT: 29 % — ABNORMAL LOW (ref 36.0–46.0)
HCT: 29.4 % — ABNORMAL LOW (ref 36.0–46.0)
Hemoglobin: 9.4 g/dL — ABNORMAL LOW (ref 12.0–15.0)
Hemoglobin: 9.6 g/dL — ABNORMAL LOW (ref 12.0–15.0)

## 2021-02-21 LAB — CBC
HCT: 26.4 % — ABNORMAL LOW (ref 36.0–46.0)
Hemoglobin: 8.3 g/dL — ABNORMAL LOW (ref 12.0–15.0)
MCH: 37.6 pg — ABNORMAL HIGH (ref 26.0–34.0)
MCHC: 31.4 g/dL (ref 30.0–36.0)
MCV: 119.5 fL — ABNORMAL HIGH (ref 80.0–100.0)
Platelets: 344 10*3/uL (ref 150–400)
RBC: 2.21 MIL/uL — ABNORMAL LOW (ref 3.87–5.11)
RDW: 22 % — ABNORMAL HIGH (ref 11.5–15.5)
WBC: 10.1 10*3/uL (ref 4.0–10.5)
nRBC: 2.4 % — ABNORMAL HIGH (ref 0.0–0.2)

## 2021-02-21 LAB — RETICULOCYTES
Immature Retic Fract: 43.1 % — ABNORMAL HIGH (ref 2.3–15.9)
RBC.: 2.21 MIL/uL — ABNORMAL LOW (ref 3.87–5.11)
Retic Count, Absolute: 261 10*3/uL — ABNORMAL HIGH (ref 19.0–186.0)
Retic Ct Pct: 11.8 % — ABNORMAL HIGH (ref 0.4–3.1)

## 2021-02-21 LAB — LACTATE DEHYDROGENASE: LDH: 424 U/L — ABNORMAL HIGH (ref 98–192)

## 2021-02-21 MED ORDER — HYDROCODONE-ACETAMINOPHEN 5-325 MG PO TABS
1.0000 | ORAL_TABLET | Freq: Four times a day (QID) | ORAL | Status: DC | PRN
  Administered 2021-02-21 – 2021-02-25 (×4): 1 via ORAL
  Filled 2021-02-21 (×4): qty 1

## 2021-02-21 NOTE — Progress Notes (Signed)
[name] is now up on 4 W.  She feels better.  She did get 2 units of blood in the emergency room.  She started the prednisone.  She has had 1 dose of IVIG.  She will get a second dose today.  Her LDH is 424.  Her hemoglobin is 8.3.  Her reticulocyte count is 11.8%.  Given that this is relapsed, I will start her on Rituxan.  This is weekly.  I would like to give her the first dose in the hospital.  As such, she might move up to 6 E. to get this done.  We will try to get this done on Monday.  I talked to her about Rituxan.  I explained to her how Rituxan works.  I went over the side effects with her.  I told her that the first dose Rituxan takes about 6-8 hours to give because the body has to adjust to it.  She may have some fevers, sweats, flushing, tingling, shortness of breath, nausea, back pain.  I said if these happen, we have ways of trying to get her through it.  She agrees to the Rituxan.  She has had no obvious bleeding.  She is going to the bathroom okay.  She has had no nausea or vomiting.  Her vital signs are all stable.  Temperature is 97.9.  Pulse 82.  Blood pressure 130/69.  Her lungs are clear bilaterally.  Cardiac exam regular rate and rhythm.  Abdomen is soft.  She has good bowel sounds.  There is no guarding or rebound tenderness.  Skin exam shows no rashes.  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  She is on prednisone and getting IVIG.  I will go ahead and give her Rituxan.  We will try to do this on Monday.  I spoke to Dickens up on 6 E.  She does not see a reason why Ms. Kosier can be brought up to the floor for Rituxan.  I am not sure there is a bed yet on 6 E.  I want to thank the wonderful staff on 4 W. for all of their outstanding help.  Christin Bach, MD  Jeri Modena 17:14

## 2021-02-21 NOTE — Plan of Care (Signed)

## 2021-02-21 NOTE — Progress Notes (Signed)
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  She  unfortunately had an acute ischemic stroke thought to be due to her anemia.    She presented to [facility] ED with anxiety, progressive shortness of breath and intermittent chest pain x1 week.  Found to have Hbg of 6.6, retic cout 15.6%, Tbili 2.3 and LDH 481.    Admitted for further management of recurrent warm autoimmune hemolytic anemia.   Transfused 2 units pRBC's on admission. Dr. Myna Hidalgo with hematology is following.  Patient started on prednisone and IVIG.   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  Presented with Hbg 6.6, down from 12.7 on 01/29/21. --Dr. Myna Hidalgo with heme/oncology following, see recs --Continue prednisone, IVIG --Planning to initiate Rituxan on Monday --Transfused 2 units pRBC's on admission --Trend Hbg, transfuse if <7.0   Left popliteal vein DVT - diagnosed 01/06/21, on Xarelto. --Continue Xarelto  Anxiety with panic attacks - cont home Zoloft.  Ativan PRN.  Polyneuropathy - cont gabapentin  Hypothyroidism - cont Synthroid  Recent hx of acute ischemic CVA - thought due to severe anemia.  Very mild word finding issues, working with [facility] SLP.  --Monitor  closely  Hypokalemia - replaced.  Monitor BMP and replace PRN.    Patient BMI: Body mass index is 25.79 kg/m.   DVT prophylaxis:  rivaroxaban (XARELTO) tablet 20 mg   Diet:  Diet Orders (From admission, onward)    Start     Ordered   02/19/21 1512  Diet regular Room service appropriate? Yes; Fluid consistency: Thin  Diet effective now       Question Answer Comment  Room service appropriate? Yes   Fluid consistency: Thin      02/19/21 1511            Code Status: Full Code    Subjective 02/21/21    Patient feels well today. Husband visiting.  Had a headache this AM.  She uses Norco twice per week at home if she has headaches, ordered.    Disposition Plan & Communication   Status is: Inpatient  Remains inpatient appropriate because:IV treatments appropriate due to intensity of illness or inability to take PO and Inpatient level of care appropriate due to severity of illness   Dispo: The patient is from: Home              Anticipated d/c is to: Home              Patient currently is not medically stable to d/c.   Difficult to place patient No    Consults, Procedures, Significant Events   Consultants:   Hematology/oncology-Dr. Myna Hidalgo  Procedures:   Transfusion of packed red blood cells  Antimicrobials:  Anti-infectives (From admission, onward)   Start  Dose/Rate Route Frequency Ordered Stop   02/19/21 1745  fluconazole (DIFLUCAN) tablet 100 mg        100 mg Oral Daily 02/19/21 1743          Micro    Objective   Vitals:   02/20/21 2352 02/21/21 0000 02/21/21 0532 02/21/21 1229  BP: (!) 90/51 (!) 105/59 130/69 131/76  Pulse: 69  82 77  Resp: 20  19 (!) 21  Temp: 98.6 F (37 C)  97.9 F (36.6 C) (!) 97.5 F (36.4 C)  TempSrc:   Oral Oral  SpO2: 98%  99% 99%  Weight:      Height:        Intake/Output Summary (Last 24 hours) at 02/21/2021 1606 Last data filed at 02/21/2021 0600 Gross per 24 hour  Intake 940 ml  Output --  Net 940 ml    Filed Weights   02/19/21 0924  Weight: 70.3 kg    Physical Exam:  General exam: awake, alert, no acute distress Respiratory system: CTAB, no wheezes, rales or rhonchi, normal respiratory effort. Cardiovascular system: normal S1/S2, RRR, no pedal edema.   Central nervous system: A&O x4. no gross focal neurologic deficits, normal speech Extremities: moves all no edema, normal tone Psychiatry: Normal mood, congruent affect, judgement and insight appear normal  Labs   Data Reviewed: I have personally reviewed following labs and imaging studies  CBC: Recent Labs  Lab 02/19/21 1125 02/20/21 0510 02/21/21 0349 02/21/21 1202  WBC 9.3 9.7 10.1  --   HGB 6.6* 9.1* 8.3* 9.4*  HCT 20.8* 27.5* 26.4* 29.0*  MCV 118.2* 110.9* 119.5*  --   PLT 415* 351 344  --    Basic Metabolic Panel: Recent Labs  Lab 02/19/21 1125 02/19/21 1802 02/20/21 0510 02/21/21 0349  NA 139  --  141 139  K 3.4*  --  4.1 3.7  CL 111  --  111 111  CO2 23  --  22 21*  GLUCOSE 111*  --  160* 93  BUN 11  --  9 16  CREATININE 0.47  --  0.53 0.59  CALCIUM 9.2  --  8.3* 8.4*  MG  --  1.6* 2.4  --    GFR: Estimated Creatinine Clearance: 62.5 mL/min (by C-G formula based on SCr of 0.59 mg/dL). Liver Function Tests: Recent Labs  Lab 02/19/21 1802  AST 38  ALT 20  ALKPHOS 39  BILITOT 2.3*  PROT 6.0*  ALBUMIN 3.4*   No results for input(s): LIPASE, AMYLASE in the last 168 hours. No results for input(s): AMMONIA in the last 168 hours. Coagulation Profile: No results for input(s): INR, PROTIME in the last 168 hours. Cardiac Enzymes: No results for input(s): CKTOTAL, CKMB, CKMBINDEX, TROPONINI in the last 168 hours. BNP (last 3 results) No results for input(s): PROBNP in the last 8760 hours. HbA1C: No results for input(s): HGBA1C in the last 72 hours. CBG: No results for input(s): GLUCAP in the last 168 hours. Lipid Profile: No results for input(s): CHOL, HDL, LDLCALC, TRIG, CHOLHDL, LDLDIRECT in  the last 72 hours. Thyroid Function Tests: No results for input(s): TSH, T4TOTAL, FREET4, T3FREE, THYROIDAB in the last 72 hours. Anemia Panel: Recent Labs    02/19/21 1311 02/20/21 0510 02/21/21 0349  VITAMINB12 479  --   --   FOLATE 37.6  --   --   FERRITIN 798*  --   --   TIBC 304  --   --   IRON 125  --   --  RETICCTPCT 15.6* 10.7* 11.8*   Sepsis Labs: No results for input(s): PROCALCITON, LATICACIDVEN in the last 168 hours.  Recent Results (from the past 240 hour(s))  SARS CORONAVIRUS 2 (TAT 6-24 HRS) Nasopharyngeal Nasopharyngeal Swab     Status: None   Collection Time: 02/19/21 12:48 PM   Specimen: Nasopharyngeal Swab  Result Value Ref Range Status   SARS Coronavirus 2 NEGATIVE NEGATIVE Final    Comment: (NOTE) SARS-CoV-2 target nucleic acids are NOT DETECTED.  The SARS-CoV-2 RNA is generally detectable in upper and lower respiratory specimens during the acute phase of infection. Negative results do not preclude SARS-CoV-2 infection, do not rule out co-infections with other pathogens, and should not be used as the sole basis for treatment or other patient management decisions. Negative results must be combined with clinical observations, patient history, and epidemiological information. The expected result is Negative.  Fact Sheet for Patients: HairSlick.no  Fact Sheet for Healthcare Providers: quierodirigir.com  This test is not yet approved or cleared by the [place] FDA and  has been authorized for detection and/or diagnosis of SARS-CoV-2 by FDA under an Emergency Use Authorization (EUA). This EUA will remain  in effect (meaning this test can be used) for the duration of the COVID-19 declaration under Se ction 564(b)(1) of the Act, 21 U.S.C. section 360bbb-3(b)(1), unless the authorization is terminated or revoked sooner.  Performed at [facility], [address], [place],  [place] [address]       Imaging Studies   VAS [place] LOWER EXTREMITY VENOUS (DVT)  Result Date: 02/21/2021  Lower Venous DVT Study Patient Name:  LEIGHTYN CINA  Date of Exam:   02/20/2021 Medical Rec #: [id number]       Accession #:    [id number] Date of Birth: [birth year]       Patient Gender: F Patient Age:   74Y Exam Location:  [facility] Procedure:      VAS [place] LOWER EXTREMITY VENOUS (DVT) Referring Phys: [id number] PETER R ENNEVER --------------------------------------------------------------------------------  Indications: Recent right peroneal DVT, 01/06/21. Other Indications: Autoimmune hemolytic anemia. Comparison Study: Prior study from 01/06/21 is available for comparison Performing Technologist: Sherren Kerns RVS  Examination Guidelines: A complete evaluation includes B-mode imaging, spectral Doppler, color Doppler, and power Doppler as needed of all accessible portions of each vessel. Bilateral testing is considered an integral part of a complete examination. Limited examinations for reoccurring indications may be performed as noted. The reflux portion of the exam is performed with the patient in reverse Trendelenburg.  +---------+---------------+---------+-----------+----------+--------------+ RIGHT    CompressibilityPhasicitySpontaneityPropertiesThrombus Aging +---------+---------------+---------+-----------+----------+--------------+ CFV      Full           Yes      Yes                                 +---------+---------------+---------+-----------+----------+--------------+ SFJ      Full                                                        +---------+---------------+---------+-----------+----------+--------------+ FV Prox  Full                                                        +---------+---------------+---------+-----------+----------+--------------+  FV Mid   Full                                                         +---------+---------------+---------+-----------+----------+--------------+ FV DistalFull                                                        +---------+---------------+---------+-----------+----------+--------------+ PFV      Full                                                        +---------+---------------+---------+-----------+----------+--------------+ POP      Full           Yes      Yes                                 +---------+---------------+---------+-----------+----------+--------------+ PTV      Full                                                        +---------+---------------+---------+-----------+----------+--------------+ PERO     Full                                                        +---------+---------------+---------+-----------+----------+--------------+ Rouleaux flow noted throughout  +----+---------------+---------+-----------+----------+--------------+ LEFTCompressibilityPhasicitySpontaneityPropertiesThrombus Aging +----+---------------+---------+-----------+----------+--------------+ CFV Full           Yes      Yes                                 +----+---------------+---------+-----------+----------+--------------+ Rouleaux flow noted in the common femoral vein    Summary: RIGHT: - Findings appear improved from previous examination. - There is no evidence of deep vein thrombosis in the lower extremity.  LEFT: - No evidence of common femoral vein obstruction.  *See table(s) above for measurements and observations. Electronically signed by Fabienne Brunsharles Fields MD on 02/21/2021 at 11:57:14 AM.    Final      Medications   Scheduled Meds: . fluconazole  100 mg Oral Daily  . folic acid  2 mg Oral Daily  . gabapentin  300 mg Oral QID  . levothyroxine  112 mcg Oral QAC breakfast  . predniSONE  80 mg Oral Q breakfast  . rivaroxaban  20 mg Oral Q supper  . sertraline  50 mg Oral Daily  . sodium chloride flush  3 mL Intravenous Q12H    Continuous Infusions: . sodium chloride    . IMMUNE GLOBULIN 10% (HUMAN) IV - For Fluid Restriction Only Stopped (02/20/21 2300)  LOS: 2 days    Time spent: 25 minutes     Pennie Banter, DO [facility]  02/21/2021, 4:06 PM      If 7PM-7AM, please contact night-coverage. How to contact the [facility] Attending or Consulting provider 7A - 7P or covering provider during after hours 7P -7A, for this patient?    1. Check the care team in [facility] and look for a) attending/consulting TR[facility] provider listed and b) the [facility] team listed 2. Log into [email or web address] and use [place]'s universal password to access. If you do not have the password, please contact the hospital operator. 3. Locate the [facility] provider you are looking for under [facility] and page to a number that you can be directly reached. 4. If you still have difficulty reaching the provider, please page the [facility] (Director on Call) for the Hospitalists listed on amion for assistance.

## 2021-02-21 NOTE — TOC Progression Note (Signed)
Transition of Care [facility]) - Progression Note    Patient Details  Name: [name] MRN: [id number] Date of Birth: 1945/11/26  [facility] CM/SW Contact  Geni Bers, RN Phone Number: 02/21/2021, 10:22 AM  Clinical Narrative:    Plan to discharge home with husband when stable.   Expected Discharge Plan: Home/Self Care Barriers to Discharge: No Barriers Identified  Expected Discharge Plan and Services Expected Discharge Plan: Home/Self Care       Living arrangements for the past 2 months: Single Family Home                                       Social Determinants of Health (SDOH) Interventions    Readmission Risk Interventions No flowsheet data found.

## 2021-02-22 LAB — CBC
HCT: 25.3 % — ABNORMAL LOW (ref 36.0–46.0)
Hemoglobin: 7.9 g/dL — ABNORMAL LOW (ref 12.0–15.0)
MCH: 37.1 pg — ABNORMAL HIGH (ref 26.0–34.0)
MCHC: 31.2 g/dL (ref 30.0–36.0)
MCV: 118.8 fL — ABNORMAL HIGH (ref 80.0–100.0)
Platelets: 332 10*3/uL (ref 150–400)
RBC: 2.13 MIL/uL — ABNORMAL LOW (ref 3.87–5.11)
RDW: 21.8 % — ABNORMAL HIGH (ref 11.5–15.5)
WBC: 8 10*3/uL (ref 4.0–10.5)
nRBC: 1.9 % — ABNORMAL HIGH (ref 0.0–0.2)

## 2021-02-22 LAB — RETICULOCYTES
Immature Retic Fract: 39.2 % — ABNORMAL HIGH (ref 2.3–15.9)
RBC.: 2.07 MIL/uL — ABNORMAL LOW (ref 3.87–5.11)
Retic Count, Absolute: 289 10*3/uL — ABNORMAL HIGH (ref 19.0–186.0)
Retic Ct Pct: 14 % — ABNORMAL HIGH (ref 0.4–3.1)

## 2021-02-22 LAB — BASIC METABOLIC PANEL
Anion gap: 4 — ABNORMAL LOW (ref 5–15)
BUN: 16 mg/dL (ref 8–23)
CO2: 23 mmol/L (ref 22–32)
Calcium: 8.2 mg/dL — ABNORMAL LOW (ref 8.9–10.3)
Chloride: 108 mmol/L (ref 98–111)
Creatinine, Ser: 0.68 mg/dL (ref 0.44–1.00)
GFR, Estimated: >60 mL/min (ref >60)
Glucose, Bld: 152 mg/dL — ABNORMAL HIGH (ref 70–99)
Potassium: 4.7 mmol/L (ref 3.5–5.1)
Sodium: 135 mmol/L (ref 135–145)

## 2021-02-22 LAB — LACTATE DEHYDROGENASE: LDH: 319 U/L — ABNORMAL HIGH (ref 98–192)

## 2021-02-22 NOTE — Progress Notes (Signed)
PROGRESS NOTE    [name]   YQM:[id number]  DOB: 1946/02/17  PCP: Burton Apley, MD    DOA: 02/19/2021 LOS: 3   Brief Narrative   75 year old female with past medical history of hypothyroidism, anxiety, idiopathic warm autoimmune hemolytic anemia, DVT of the R peroneal vein on Xarelto with recent hospitalization from 4/15-4/25 at which point she was diagnosed with autoimmune hemolytic anemia,  treated with prednisone and IVIG with excellent response (Hbg 12.7 at hematology follow up 3 weeks ago).  She  unfortunately had an acute ischemic stroke thought to be due to her anemia.    She presented to [facility] ED with anxiety, progressive shortness of breath and intermittent chest pain x1 week.  Found to have Hbg of 6.6, retic cout 15.6%, Tbili 2.3 and LDH 481.    Admitted for further management of recurrent warm autoimmune hemolytic anemia.   Transfused 2 units pRBC's on admission. Dr. Myna Hidalgo with hematology is following.  Patient started on prednisone and IVIG.   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  Presented with Hbg 6.6, down from 12.7 on 01/29/21. --Dr. Myna Hidalgo with heme/oncology following, see recs --Continue prednisone, IVIG --Planning to initiate Rituxan on Monday --Transfused 2 units pRBC's on admission --Trend Hbg, transfuse if <7.0  Headache / Hx of Migraines - takes Norco at home for HA's, ordered.  Tylenol or Norco PRN.  Consider Toradol next, Fioricet if daytime (caffeine).  Left popliteal vein DVT - diagnosed 01/06/21, on Xarelto. --Continue Xarelto  Anxiety with panic attacks - cont home Zoloft.  Ativan PRN.  Polyneuropathy - cont gabapentin  Hypothyroidism - cont  Synthroid  Recent hx of acute ischemic CVA - thought due to severe anemia.  Very mild word finding issues, working with [facility] SLP.  --Monitor closely  Hypokalemia - replaced.  Monitor BMP and replace PRN.    Patient BMI: Body mass index is 25.79 kg/m.   DVT prophylaxis:  rivaroxaban (XARELTO) tablet 20 mg   Diet:  Diet Orders (From admission, onward)    Start     Ordered   02/19/21 1512  Diet regular Room service appropriate? Yes; Fluid consistency: Thin  Diet effective now       Question Answer Comment  Room service appropriate? Yes   Fluid consistency: Thin      02/19/21 1511            Code Status: Full Code    Subjective 02/22/21    Patient has a headache this AM that started around 11PM last night.  Delay in getting medication until this AM, so she did not sleep well.  No other acute complaints.  She asks about cause of her anemia and expresses fear about it, understandably.   Disposition Plan & Communication   Status is: Inpatient  Remains inpatient appropriate because:IV treatments appropriate due to intensity of illness or inability to take PO and Inpatient level of care appropriate due to severity of illness   Dispo: The patient is from: Home              Anticipated d/c is to: Home              Patient currently is not medically stable to  d/c.   Difficult to place patient No    Consults, Procedures, Significant Events   Consultants:   Hematology/oncology-Dr. Myna Hidalgo  Procedures:   Transfusion of packed red blood cells  Antimicrobials:  Anti-infectives (From admission, onward)   Start     Dose/Rate Route Frequency Ordered Stop   02/19/21 1745  fluconazole (DIFLUCAN) tablet 100 mg        100 mg Oral Daily 02/19/21 1743          Micro    Objective   Vitals:   02/21/21 2345 02/22/21 0045 02/22/21 0130 02/22/21 0508  BP: 117/64 (!) 115/59 106/64 110/70  Pulse: 61 62 67 62  Resp: 20 19 20 16   Temp: 97.8 F (36.6 C) 98.3 F (36.8 C)  97.9 F (36.6 C) 97.7 F (36.5 C)  TempSrc: Oral Oral Oral Oral  SpO2: 96% 100% 95% 96%  Weight:      Height:        Intake/Output Summary (Last 24 hours) at 02/22/2021 1445 Last data filed at 02/22/2021 1426 Gross per 24 hour  Intake 1660 ml  Output --  Net 1660 ml   Filed Weights   02/19/21 0924  Weight: 70.3 kg    Physical Exam:  General exam: awake, alert, no acute distress Respiratory system: normal respiratory effort, on room air. Cardiovascular system: RRR, no pedal edema.   Central nervous system: A&O x4. no gross focal neurologic deficits, normal speech Extremities: moves all normal tone Psychiatry: Normal mood, congruent affect, judgement and insight appear normal  Labs   Data Reviewed: I have personally reviewed following labs and imaging studies  CBC: Recent Labs  Lab 02/19/21 1125 02/20/21 0510 02/21/21 0349 02/21/21 1202 02/21/21 1848 02/22/21 0324  WBC 9.3 9.7 10.1  --   --  8.0  HGB 6.6* 9.1* 8.3* 9.4* 9.6* 7.9*  HCT 20.8* 27.5* 26.4* 29.0* 29.4* 25.3*  MCV 118.2* 110.9* 119.5*  --   --  118.8*  PLT 415* 351 344  --   --  332   Basic Metabolic Panel: Recent Labs  Lab 02/19/21 1125 02/19/21 1802 02/20/21 0510 02/21/21 0349 02/22/21 0324  NA 139  --  141 139 135  K 3.4*  --  4.1 3.7 4.7  CL 111  --  111 111 108  CO2 23  --  22 21* 23  GLUCOSE 111*  --  160* 93 152*  BUN 11  --  9 16 16   CREATININE 0.47  --  0.53 0.59 0.68  CALCIUM 9.2  --  8.3* 8.4* 8.2*  MG  --  1.6* 2.4  --   --    GFR: Estimated Creatinine Clearance: 62.5 mL/min (by C-G formula based on SCr of 0.68 mg/dL). Liver Function Tests: Recent Labs  Lab 02/19/21 1802  AST 38  ALT 20  ALKPHOS 39  BILITOT 2.3*  PROT 6.0*  ALBUMIN 3.4*   No results for input(s): LIPASE, AMYLASE in the last 168 hours. No results for input(s): AMMONIA in the last 168 hours. Coagulation Profile: No results for input(s): INR, PROTIME in the last 168 hours. Cardiac Enzymes: No results  for input(s): CKTOTAL, CKMB, CKMBINDEX, TROPONINI in the last 168 hours. BNP (last 3 results) No results for input(s): PROBNP in the last 8760 hours. HbA1C: No results for input(s): HGBA1C in the last 72 hours. CBG: No results for input(s): GLUCAP in the last 168 hours. Lipid Profile: No results for input(s): CHOL, HDL, LDLCALC, TRIG, CHOLHDL, LDLDIRECT in the last 72  hours. Thyroid Function Tests: No results for input(s): TSH, T4TOTAL, FREET4, T3FREE, THYROIDAB in the last 72 hours. Anemia Panel: Recent Labs    02/21/21 0349 02/22/21 0324  RETICCTPCT 11.8* 14.0*   Sepsis Labs: No results for input(s): PROCALCITON, LATICACIDVEN in the last 168 hours.  Recent Results (from the past 240 hour(s))  SARS CORONAVIRUS 2 (TAT 6-24 HRS) Nasopharyngeal Nasopharyngeal Swab     Status: None   Collection Time: 02/19/21 12:48 PM   Specimen: Nasopharyngeal Swab  Result Value Ref Range Status   SARS Coronavirus 2 NEGATIVE NEGATIVE Final    Comment: (NOTE) SARS-CoV-2 target nucleic acids are NOT DETECTED.  The SARS-CoV-2 RNA is generally detectable in upper and lower respiratory specimens during the acute phase of infection. Negative results do not preclude SARS-CoV-2 infection, do not rule out co-infections with other pathogens, and should not be used as the sole basis for treatment or other patient management decisions. Negative results must be combined with clinical observations, patient history, and epidemiological information. The expected result is Negative.  Fact Sheet for Patients: HairSlick.no  Fact Sheet for Healthcare Providers: quierodirigir.com  This test is not yet approved or cleared by the [place] FDA and  has been authorized for detection and/or diagnosis of SARS-CoV-2 by FDA under an Emergency Use Authorization (EUA). This EUA will remain  in effect (meaning this test can be used) for the duration of  the COVID-19 declaration under Se ction 564(b)(1) of the Act, 21 U.S.C. section 360bbb-3(b)(1), unless the authorization is terminated or revoked sooner.  Performed at [facility], [address] [place], [place] [address]       Imaging Studies   No results found.   Medications   Scheduled Meds: . fluconazole  100 mg Oral Daily  . folic acid  2 mg Oral Daily  . gabapentin  300 mg Oral QID  . levothyroxine  112 mcg Oral QAC breakfast  . predniSONE  80 mg Oral Q breakfast  . rivaroxaban  20 mg Oral Q supper  . sertraline  50 mg Oral Daily  . sodium chloride flush  3 mL Intravenous Q12H   Continuous Infusions: . sodium chloride         LOS: 3 days    Time spent: 25 minutes with >50% spent at bedside and in coordination of care.    Pennie Banter, DO [facility]  02/22/2021, 2:45 PM      If 7PM-7AM, please contact night-coverage. How to contact the [facility] Attending or Consulting provider 7A - 7P or covering provider during after hours 7P -7A, for this patient?    1. Check the care team in [facility] and look for a) attending/consulting TRH provider listed and b) the [facility] team listed 2. Log into [email or web address] and use [place] universal password to access. If you do not have the password, please contact the hospital operator. 3. Locate the [facility] provider you are looking for under [facility] and page to a number that you can be directly reached. 4. If you still have difficulty reaching the provider, please page the [facility] (Director on Call) for the Hospitalists listed on amion for assistance.

## 2021-02-22 NOTE — Plan of Care (Signed)

## 2021-02-23 LAB — RETICULOCYTES
Immature Retic Fract: 31.8 % — ABNORMAL HIGH (ref 2.3–15.9)
RBC.: 2.35 MIL/uL — ABNORMAL LOW (ref 3.87–5.11)
Retic Count, Absolute: 371.3 10*3/uL — ABNORMAL HIGH (ref 19.0–186.0)
Retic Ct Pct: 15.8 % — ABNORMAL HIGH (ref 0.4–3.1)

## 2021-02-23 LAB — BPAM RBC
Blood Product Expiration Date: 202206242359
Blood Product Expiration Date: 202207072359
ISSUE DATE / TIME: 202206030037
ISSUE DATE / TIME: 202206030240
Unit Type and Rh: 600
Unit Type and Rh: 600

## 2021-02-23 LAB — CBC
HCT: 27 % — ABNORMAL LOW (ref 36.0–46.0)
Hemoglobin: 8.7 g/dL — ABNORMAL LOW (ref 12.0–15.0)
MCH: 37.3 pg — ABNORMAL HIGH (ref 26.0–34.0)
MCHC: 32.2 g/dL (ref 30.0–36.0)
MCV: 115.9 fL — ABNORMAL HIGH (ref 80.0–100.0)
Platelets: 338 10*3/uL (ref 150–400)
RBC: 2.33 MIL/uL — ABNORMAL LOW (ref 3.87–5.11)
RDW: 20.7 % — ABNORMAL HIGH (ref 11.5–15.5)
WBC: 8.3 10*3/uL (ref 4.0–10.5)
nRBC: 1.6 % — ABNORMAL HIGH (ref 0.0–0.2)

## 2021-02-23 LAB — TYPE AND SCREEN
ABO/RH(D): A NEG
Antibody Screen: POSITIVE
Unit division: 0
Unit division: 0

## 2021-02-23 LAB — LACTATE DEHYDROGENASE: LDH: 275 U/L — ABNORMAL HIGH (ref 98–192)

## 2021-02-23 LAB — PROTEIN S, TOTAL: Protein S Ag, Total: 100 % (ref 60–150)

## 2021-02-23 LAB — PROTEIN S ACTIVITY: Protein S Activity: 103 % (ref 63–140)

## 2021-02-23 MED ORDER — FAMCICLOVIR 500 MG PO TABS
250.0000 mg | ORAL_TABLET | Freq: Every day | ORAL | Status: DC
  Administered 2021-02-23 – 2021-02-25 (×3): 250 mg via ORAL
  Filled 2021-02-23 (×3): qty 0.5

## 2021-02-23 NOTE — Progress Notes (Signed)
[facility] visited pt. per referral from [facility]; pt. sitting up in bed w/husband [name] at bedside.  Pt. 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  Pt. expressed that it has been difficult to cope with the unknowns of her medical condition, specifically with the potential side effects of medications being trialed in her treatment; she shared that she already struggles with anxiety and has been told that new medication may make her additionally anxious.  Pt. shared feeling well-supported by family (her husband and two sons), close friends, and her pastor who has been calling daily and plans to visit.  Husband's dementia means pt. is responsible for financial operations of the house and pt. shared this has been a stressor in her current condition.  Pt. requested prayer for successful treatment and for sense of divine presence in her current situation.  Pt. grateful for visit and other chaplains' visits.   will continue to follow as able and remain available as needed vis page.  [name] PRN Chaplain Pager: [phone]

## 2021-02-23 NOTE — Progress Notes (Addendum)
[name] had a pretty good weekend.  Her hemoglobin might be trending upward right now.  Her hemoglobin is 8.7.  The reticulocyte count is 15.8%.  Her LDH is starting to come down.  It is 275.  I still would like to start Rituxan on her.  I think this would be a very reasonable addition to her therapy.  I would do weekly Rituxan.  I would like to do the first 1 in the hospital.  We really need to get her up to 6 E. to do this.  She has had no cough or shortness of breath.  She has had no nausea or vomiting.  She is going to the bathroom okay.  She is eating okay.  She has had no problems with fever.  She has noted some redness and pain on the left side of her face.  I do not see any kind of blisters.  I do not see much in the way of erythema.  She had a Doppler over right leg on Friday.  There is no residual thrombus in the right leg.  I would keep her on the Xarelto for right now at 20 mg.  After 3 months, then I would put her down to 10 mg a day.  Her vital signs are stable.  Temperature is 98.  Pulse 50.  Blood pressure 122/62.  Her head neck exam shows no thrush.  I really do not see any erythema on her face.  There is no adenopathy.  Lungs are clear bilaterally.  Cardiac exam regular rate and rhythm.  Abdomen is soft.  Bowel sounds are decreased but present.  There is no guarding or rebound tenderness.  Skin exam shows no rashes.  Extremity shows no clubbing, cyanosis or edema.  Again, it be nice to give her Rituxan today.  I am not sure what the bed situation is like up on 6 E.  If bed is open, please have her transferred up there so we can do Rituxan.  It would be nice if we see her hemoglobin improving, that she would be able to go home.  I appreciate the outstanding care that she is getting from all the staff on [address].  Christin Bach, MD  [name] 1:9

## 2021-02-23 NOTE — Progress Notes (Signed)
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  She  unfortunately had an acute ischemic stroke thought to be due to her anemia.    She presented to [facility] ED with anxiety, progressive shortness of breath and intermittent chest pain x1 week.  Found to have Hbg of 6.6, retic cout 15.6%, Tbili 2.3 and LDH 481.    Admitted for further management of recurrent warm autoimmune hemolytic anemia.   Transfused 2 units pRBC's on admission. Dr. Myna Hidalgo with hematology is following.  Patient started on prednisone and IVIG.   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  Presented with Hbg 6.6, down from 12.7 on 01/29/21. --Dr. Myna Hidalgo with heme/oncology following, see recs --Continue prednisone, IVIG --Planning to initiate Rituxan on Monday --Transfused 2 units pRBC's on admission --Trend Hbg, transfuse if <7.0 Transfer to 6E for Rituxan infusion  Headache / Hx of Migraines - takes Norco at home for HA's, ordered.  Tylenol or Norco PRN.  Consider Toradol next, Fioricet if daytime (caffeine).  Left popliteal vein DVT - diagnosed 01/06/21, on Xarelto. --Continue Xarelto  Anxiety with panic attacks - cont home Zoloft.  Ativan PRN.  Polyneuropathy - cont  gabapentin  Hypothyroidism - cont Synthroid  Recent hx of acute ischemic CVA - thought due to severe anemia.  Very mild word finding issues, working with [facility] SLP.  --Monitor closely  Hypokalemia - replaced.  Monitor BMP and replace PRN.    Patient BMI: Body mass index is 25.79 kg/m.   DVT prophylaxis:  rivaroxaban (XARELTO) tablet 20 mg   Diet:  Diet Orders (From admission, onward)    Start     Ordered   02/19/21 1512  Diet regular Room service appropriate? Yes; Fluid consistency: Thin  Diet effective now       Question Answer Comment  Room service appropriate? Yes   Fluid consistency: Thin      02/19/21 1511            Code Status: Full Code    Subjective 02/23/21    Patient says feeling okay today, anxious about starting on Rituxan.  Has a headache again this AM, but denies other acute complaints.   Disposition Plan & Communication   Status is: Inpatient  Remains inpatient appropriate because:IV treatments appropriate due to intensity of illness or inability to take PO and Inpatient level of care appropriate due to severity of illness   Dispo: The patient is from: Home              Anticipated d/c is to: Home              Patient currently is not medically stable to d/c.   Difficult to place patient No  Family communication:  Husband at bedside on round  today 6/6  Consults, Procedures, Significant Events   Consultants:   Hematology/oncology-Dr. Myna Hidalgo  Procedures:   Transfusion of packed red blood cells  Antimicrobials:  Anti-infectives (From admission, onward)   Start     Dose/Rate Route Frequency Ordered Stop   02/23/21 1000  famciclovir [facility]) tablet 250 mg        250 mg Oral Daily 02/23/21 0650     02/19/21 1745  fluconazole (DIFLUCAN) tablet 100 mg        100 mg Oral Daily 02/19/21 1743          Micro    Objective   Vitals:   02/22/21 2108 02/23/21 0447 02/23/21 0800 02/23/21 1325  BP: 123/75 122/62  125/65  Pulse: 61 (!) 50   69  Resp: 18 20 20  (!) 22  Temp: 98.4 F (36.9 C) 98 F (36.7 C)  98.6 F (37 C)  TempSrc: Oral Oral  Oral  SpO2: 98% 97%  97%  Weight:      Height:        Intake/Output Summary (Last 24 hours) at 02/23/2021 1516 Last data filed at 02/23/2021 1300 Gross per 24 hour  Intake 120 ml  Output --  Net 120 ml   Filed Weights   02/19/21 0924  Weight: 70.3 kg    Physical Exam:  General exam: awake, alert, no acute distress Respiratory system: normal respiratory effort, on room air. Cardiovascular system: RRR, no pedal edema.   Central nervous system: A&O x4. no gross focal neurologic deficits, normal speech  Labs   Data Reviewed: I have personally reviewed following labs and imaging studies  CBC: Recent Labs  Lab 02/19/21 1125 02/20/21 0510 02/21/21 0349 02/21/21 1202 02/21/21 1848 02/22/21 0324 02/23/21 0409  WBC 9.3 9.7 10.1  --   --  8.0 8.3  HGB 6.6* 9.1* 8.3* 9.4* 9.6* 7.9* 8.7*  HCT 20.8* 27.5* 26.4* 29.0* 29.4* 25.3* 27.0*  MCV 118.2* 110.9* 119.5*  --   --  118.8* 115.9*  PLT 415* 351 344  --   --  332 338   Basic Metabolic Panel: Recent Labs  Lab 02/19/21 1125 02/19/21 1802 02/20/21 0510 02/21/21 0349 02/22/21 0324  NA 139  --  141 139 135  K 3.4*  --  4.1 3.7 4.7  CL 111  --  111 111 108  CO2 23  --  22 21* 23  GLUCOSE 111*  --  160* 93 152*  BUN 11  --  9 16 16   CREATININE 0.47  --  0.53 0.59 0.68  CALCIUM 9.2  --  8.3* 8.4* 8.2*  MG  --  1.6* 2.4  --   --    GFR: Estimated Creatinine Clearance: 62.5 mL/min (by C-G formula based on SCr of 0.68 mg/dL). Liver Function Tests: Recent Labs  Lab 02/19/21 1802  AST 38  ALT 20  ALKPHOS 39  BILITOT 2.3*  PROT 6.0*  ALBUMIN 3.4*   No results for input(s): LIPASE, AMYLASE in the last 168 hours. No results for input(s): AMMONIA in the last 168 hours. Coagulation Profile: No results for input(s): INR, PROTIME in the last 168 hours. Cardiac Enzymes: No results for input(s): CKTOTAL, CKMB,  CKMBINDEX, TROPONINI in the last 168 hours. BNP (last 3 results) No results for input(s): PROBNP in the last 8760 hours. HbA1C: No results for input(s): HGBA1C in the last 72 hours. CBG: No results for input(s): GLUCAP in the last 168 hours. Lipid Profile: No results for input(s): CHOL, HDL, LDLCALC,  TRIG, CHOLHDL, LDLDIRECT in the last 72 hours. Thyroid Function Tests: No results for input(s): TSH, T4TOTAL, FREET4, T3FREE, THYROIDAB in the last 72 hours. Anemia Panel: Recent Labs    02/22/21 0324 02/23/21 0409  RETICCTPCT 14.0* 15.8*   Sepsis Labs: No results for input(s): PROCALCITON, LATICACIDVEN in the last 168 hours.  Recent Results (from the past 240 hour(s))  SARS CORONAVIRUS 2 (TAT 6-24 HRS) Nasopharyngeal Nasopharyngeal Swab     Status: None   Collection Time: 02/19/21 12:48 PM   Specimen: Nasopharyngeal Swab  Result Value Ref Range Status   SARS Coronavirus 2 NEGATIVE NEGATIVE Final    Comment: (NOTE) SARS-CoV-2 target nucleic acids are NOT DETECTED.  The SARS-CoV-2 RNA is generally detectable in upper and lower respiratory specimens during the acute phase of infection. Negative results do not preclude SARS-CoV-2 infection, do not rule out co-infections with other pathogens, and should not be used as the sole basis for treatment or other patient management decisions. Negative results must be combined with clinical observations, patient history, and epidemiological information. The expected result is Negative.  Fact Sheet for Patients: HairSlick.no  Fact Sheet for Healthcare Providers: quierodirigir.com  This test is not yet approved or cleared by the [place] FDA and  has been authorized for detection and/or diagnosis of SARS-CoV-2 by FDA under an Emergency Use Authorization (EUA). This EUA will remain  in effect (meaning this test can be used) for the duration of the COVID-19 declaration under Se  ction 564(b)(1) of the Act, 21 U.S.C. section 360bbb-3(b)(1), unless the authorization is terminated or revoked sooner.  Performed at [facility], [address] [place], [place] [address]       Imaging Studies   No results found.   Medications   Scheduled Meds: . famciclovir  250 mg Oral Daily  . fluconazole  100 mg Oral Daily  . folic acid  2 mg Oral Daily  . gabapentin  300 mg Oral QID  . levothyroxine  112 mcg Oral QAC breakfast  . predniSONE  80 mg Oral Q breakfast  . rivaroxaban  20 mg Oral Q supper  . sertraline  50 mg Oral Daily  . sodium chloride flush  3 mL Intravenous Q12H   Continuous Infusions: . sodium chloride         LOS: 4 days    Time spent: 20 minutes    Pennie Banter, DO [facility]  02/23/2021, 3:16 PM      If 7PM-7AM, please contact night-coverage. How to contact the [facility] Attending or Consulting provider 7A - 7P or covering provider during after hours 7P -7A, for this patient?    1. Check the care team in [facility] and look for a) attending/consulting TRH provider listed and b) the [facility] team listed 2. Log into [email or web address] and use [place]'s universal password to access. If you do not have the password, please contact the hospital operator. 3. Locate the [facility] provider you are looking for under [facility] and page to a number that you can be directly reached. 4. If you still have difficulty reaching the provider, please page the [facility] (Director on Call) for the Hospitalists listed on amion for assistance.

## 2021-02-23 NOTE — Care Management Important Message (Signed)
Important Message  Patient Details IM Letter given to the Patient. Name: [name] MRN: [id number] Date of Birth: [birth year]   Medicare Important Message Given:  Yes     Caren Macadam 02/23/2021, 12:25 PM

## 2021-02-24 ENCOUNTER — Other Ambulatory Visit: Payer: Self-pay | Admitting: Pharmacist

## 2021-02-24 LAB — COMPREHENSIVE METABOLIC PANEL
ALT: 19 U/L (ref 0–44)
AST: 20 U/L (ref 15–41)
Albumin: 3.1 g/dL — ABNORMAL LOW (ref 3.5–5.0)
Alkaline Phosphatase: 34 U/L — ABNORMAL LOW (ref 38–126)
Anion gap: 8 (ref 5–15)
BUN: 14 mg/dL (ref 8–23)
CO2: 24 mmol/L (ref 22–32)
Calcium: 9 mg/dL (ref 8.9–10.3)
Chloride: 103 mmol/L (ref 98–111)
Creatinine, Ser: 0.81 mg/dL (ref 0.44–1.00)
GFR, Estimated: >60 mL/min (ref >60)
Glucose, Bld: 93 mg/dL (ref 70–99)
Potassium: 3.7 mmol/L (ref 3.5–5.1)
Sodium: 135 mmol/L (ref 135–145)
Total Bilirubin: 0.8 mg/dL (ref 0.3–1.2)
Total Protein: 8.2 g/dL — ABNORMAL HIGH (ref 6.5–8.1)

## 2021-02-24 LAB — CBC
HCT: 31.3 % — ABNORMAL LOW (ref 36.0–46.0)
Hemoglobin: 10.6 g/dL — ABNORMAL LOW (ref 12.0–15.0)
MCH: 38.1 pg — ABNORMAL HIGH (ref 26.0–34.0)
MCHC: 33.9 g/dL (ref 30.0–36.0)
MCV: 112.6 fL — ABNORMAL HIGH (ref 80.0–100.0)
Platelets: 406 10*3/uL — ABNORMAL HIGH (ref 150–400)
RBC: 2.78 MIL/uL — ABNORMAL LOW (ref 3.87–5.11)
RDW: 20 % — ABNORMAL HIGH (ref 11.5–15.5)
WBC: 10.6 10*3/uL — ABNORMAL HIGH (ref 4.0–10.5)
nRBC: 2.5 % — ABNORMAL HIGH (ref 0.0–0.2)

## 2021-02-24 LAB — LACTATE DEHYDROGENASE: LDH: 287 U/L — ABNORMAL HIGH (ref 98–192)

## 2021-02-24 LAB — RETICULOCYTES
Immature Retic Fract: 26.1 % — ABNORMAL HIGH (ref 2.3–15.9)
RBC.: 2.72 MIL/uL — ABNORMAL LOW (ref 3.87–5.11)
Retic Count, Absolute: 461 10*3/uL — ABNORMAL HIGH (ref 19.0–186.0)
Retic Ct Pct: 15.4 % — ABNORMAL HIGH (ref 0.4–3.1)

## 2021-02-24 MED ORDER — DIPHENHYDRAMINE HCL 50 MG/ML IJ SOLN: 50.0000 mg | Freq: Once | INTRAMUSCULAR | Status: DC | PRN

## 2021-02-24 MED ORDER — ALBUTEROL SULFATE (2.5 MG/3ML) 0.083% IN NEBU: 2.5000 mg | INHALATION_SOLUTION | Freq: Once | RESPIRATORY_TRACT | Status: DC | PRN

## 2021-02-24 MED ORDER — SODIUM CHLORIDE 0.9 % IV SOLN: Freq: Once | INTRAVENOUS | Status: DC | PRN

## 2021-02-24 MED ORDER — ACETAMINOPHEN 325 MG PO TABS
650.0000 mg | ORAL_TABLET | Freq: Once | ORAL | Status: AC
  Filled 2021-02-24: qty 2

## 2021-02-24 MED ORDER — SODIUM CHLORIDE 0.9 % IV SOLN: Freq: Once | INTRAVENOUS | Status: AC

## 2021-02-24 MED ORDER — PREDNISONE 20 MG PO TABS
60.0000 mg | ORAL_TABLET | Freq: Every day | ORAL | Status: DC
  Administered 2021-02-24 – 2021-02-25 (×2): 60 mg via ORAL
  Filled 2021-02-24 (×2): qty 3

## 2021-02-24 MED ORDER — SODIUM CHLORIDE 0.9 % IV SOLN
375.0000 mg/m2 | Freq: Once | INTRAVENOUS | Status: AC
  Administered 2021-02-24: 700 mg via INTRAVENOUS
  Filled 2021-02-24: qty 50

## 2021-02-24 MED ORDER — DIPHENHYDRAMINE HCL 50 MG PO CAPS
50.0000 mg | ORAL_CAPSULE | Freq: Once | ORAL | Status: AC
  Administered 2021-02-24: 50 mg via ORAL
  Filled 2021-02-24: qty 1

## 2021-02-24 MED ORDER — SODIUM CHLORIDE 0.9% FLUSH: 10.0000 mL | INTRAVENOUS | Status: DC | PRN

## 2021-02-24 MED ORDER — HEPARIN SOD (PORK) LOCK FLUSH 100 UNIT/ML IV SOLN: 500.0000 [IU] | Freq: Once | INTRAVENOUS | Status: DC | PRN

## 2021-02-24 MED ORDER — HEPARIN SOD (PORK) LOCK FLUSH 100 UNIT/ML IV SOLN: 250.0000 [IU] | Freq: Once | INTRAVENOUS | Status: DC | PRN

## 2021-02-24 MED ORDER — EPINEPHRINE 0.3 MG/0.3ML IJ SOAJ
0.3000 mg | Freq: Once | INTRAMUSCULAR | Status: DC | PRN
  Filled 2021-02-24: qty 0.6

## 2021-02-24 MED ORDER — ALTEPLASE 2 MG IJ SOLR: 2.0000 mg | Freq: Once | INTRAMUSCULAR | Status: DC | PRN

## 2021-02-24 MED ORDER — SODIUM CHLORIDE 0.9% FLUSH: 3.0000 mL | INTRAVENOUS | Status: DC | PRN

## 2021-02-24 MED ORDER — METHYLPREDNISOLONE SODIUM SUCC 125 MG IJ SOLR: 125.0000 mg | Freq: Once | INTRAMUSCULAR | Status: DC | PRN

## 2021-02-24 MED ORDER — FAMOTIDINE IN NACL 20-0.9 MG/50ML-% IV SOLN
20.0000 mg | Freq: Once | INTRAVENOUS | Status: DC | PRN
  Filled 2021-02-24: qty 50

## 2021-02-24 MED ORDER — LIDOCAINE-PRILOCAINE 2.5-2.5 % EX CREA
TOPICAL_CREAM | CUTANEOUS | 3 refills | Status: DC
Start: 2021-02-24 — End: 2021-08-24

## 2021-02-24 NOTE — Progress Notes (Signed)
Tele monitor tech notified RN that pulse went down to less than 39 a few times. NP [place] on call was notified. Patient is asleep and arousable at this time.[name]

## 2021-02-24 NOTE — Progress Notes (Addendum)
The prednisone and the IVIG are working nicely now.  Her hemoglobin is up to 10.6.  Her "color" is also looking better.  I realize some of this is from the high dose of prednisone that she is on.  She will get her Rituxan today.  I still think Rituxan is a very good intervention with respect to relapse of the autoimmune hemolytic anemia.  I think that once she has Rituxan, she can either go home tonight or tomorrow.  I would do the Rituxan as an outpatient for the next 3 weeks.  She feels well.  She is eating okay.  She has little bit of a headache from the steroids.  She has had no problems with bowels or bladder.  There is no mouth sores.  She has had no oral thrush.  Her LDH is 287.  Her glucose is only 93.  Her vital signs show temperature of 97.9.  Pulse 62.  Blood pressure 158/74.  Her oral exam shows no thrush.  Lungs are clear bilaterally.  Cardiac exam regular rate and rhythm.  Abdomen is soft.  Bowel sounds are present may be slightly decreased.  She has no fluid wave.  There is no palpable splenomegaly.  Extremities shows no clubbing, cyanosis or edema.  We will go ahead with the Rituxan today.  She continues on the Xarelto.  I will keep her on the 20 mg a day for right now.  I am just happy that she is responding to immunosuppressive therapy now.  I think we just are going to have to slowly taper down the prednisone.  I appreciate the great care that she will be getting up on the floor on 6 E.  Christin Bach, MD  1 Thessalonians 5:16-18   ADDENDUM: As far as Rituxan is concerned, we are okay to treat her without having the pretreatment labs done.  I really do not think that her risk of a hepatitis is going to be significant.  She has no risk factors for having hepatitis.  1 dose of Rituxan will not affect this in my opinion.  Christin Bach, MD  Hughie Closs 2:8

## 2021-02-24 NOTE — Progress Notes (Signed)
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  She  unfortunately had an acute ischemic stroke thought to be due to her anemia.    She presented to [facility] ED with anxiety, progressive shortness of breath and intermittent chest pain x1 week.  Found to have Hbg of 6.6, retic cout 15.6%, Tbili 2.3 and LDH 481.    Admitted for further management of recurrent warm autoimmune hemolytic anemia.   Transfused 2 units pRBC's on admission. Dr. Myna Hidalgo with hematology is following.  Patient started on prednisone and IVIG.   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  Presented with Hbg 6.6, down from 12.7 on 01/29/21. --Dr. Myna Hidalgo with heme/oncology following, see recs --Continue prednisone, IVIG --Starting Rituxan Today --Transfused 2 units pRBC's on admission --Trend Hbg, transfuse if <7.0  Headache / Hx of Migraines - takes Norco at home for HA's, ordered.  Tylenol or Norco PRN.  Consider Toradol next, Fioricet if daytime (caffeine).  Left popliteal vein DVT - diagnosed 01/06/21, on Xarelto. --Continue Xarelto  Anxiety with panic attacks - cont home Zoloft.  Ativan PRN.  Polyneuropathy - cont gabapentin  Hypothyroidism - cont Synthroid  Recent hx  of acute ischemic CVA - thought due to severe anemia.  Very mild word finding issues, working with [facility] SLP.  --Monitor closely  Hypokalemia - replaced.  Monitor BMP and replace PRN.    Patient BMI: Body mass index is 25.79 kg/m.   DVT prophylaxis:  rivaroxaban (XARELTO) tablet 20 mg   Diet:  Diet Orders (From admission, onward)    Start     Ordered   02/19/21 1512  Diet regular Room service appropriate? Yes; Fluid consistency: Thin  Diet effective now       Question Answer Comment  Room service appropriate? Yes   Fluid consistency: Thin      02/19/21 1511            Code Status: Full Code    Subjective 02/24/21    Patient seen while nurses were starting her Rituxan infusion.  Pt feels well, denies acute complaints.  Little worried about this medicine, but seems less anxious than prior days.  Says she sent husband home bc he was stressing her out.   Disposition Plan & Communication   Status is: Inpatient  Remains inpatient appropriate because:IV treatments appropriate due to intensity of illness or inability to take PO and Inpatient level of care appropriate due to severity of illness   Dispo: The patient is from: Home              Anticipated d/c is to: Home              Patient currently is not medically stable to d/c.   Difficult to place patient  No  Family communication:  Husband at bedside on round today 6/6  Consults, Procedures, Significant Events   Consultants:   Hematology/oncology-Dr. Myna Hidalgo  Procedures:   Transfusion of packed red blood cells  Antimicrobials:  Anti-infectives (From admission, onward)   Start     Dose/Rate Route Frequency Ordered Stop   02/23/21 1000  famciclovir [facility]) tablet 250 mg        250 mg Oral Daily 02/23/21 0650     02/19/21 1745  fluconazole (DIFLUCAN) tablet 100 mg        100 mg Oral Daily 02/19/21 1743          Micro    Objective   Vitals:   02/24/21 0319 02/24/21 0630 02/24/21 1259 02/24/21 1340  BP:  137/80 (!) 158/74 117/71 (!) 123/58  Pulse: 62 62 (!) 57 64  Resp: 18 18 20 17   Temp: 97.6 F (36.4 C) 97.9 F (36.6 C) 98 F (36.7 C) 98 F (36.7 C)  TempSrc: Oral Oral Oral Oral  SpO2: 100% 99% 96% 95%  Weight:      Height:        Intake/Output Summary (Last 24 hours) at 02/24/2021 1938 Last data filed at 02/24/2021 1801 Gross per 24 hour  Intake 960 ml  Output --  Net 960 ml   Filed Weights   02/19/21 0924  Weight: 70.3 kg    Physical Exam:  General exam: awake, alert, no acute distress Respiratory system: normal respiratory effort, on room air. Cardiovascular system: RRR, no pedal edema.   Central nervous system: A&O x4. no gross focal neurologic deficits, normal speech  Labs   Data Reviewed: I have personally reviewed following labs and imaging studies  CBC: Recent Labs  Lab 02/20/21 0510 02/21/21 0349 02/21/21 1202 02/21/21 1848 02/22/21 0324 02/23/21 0409 02/24/21 0532  WBC 9.7 10.1  --   --  8.0 8.3 10.6*  HGB 9.1* 8.3* 9.4* 9.6* 7.9* 8.7* 10.6*  HCT 27.5* 26.4* 29.0* 29.4* 25.3* 27.0* 31.3*  MCV 110.9* 119.5*  --   --  118.8* 115.9* 112.6*  PLT 351 344  --   --  332 338 406*   Basic Metabolic Panel: Recent Labs  Lab 02/19/21 1125 02/19/21 1802 02/20/21 0510 02/21/21 0349 02/22/21 0324 02/24/21 0532  NA 139  --  141 139 135 135  K 3.4*  --  4.1 3.7 4.7 3.7  CL 111  --  111 111 108 103  CO2 23  --  22 21* 23 24  GLUCOSE 111*  --  160* 93 152* 93  BUN 11  --  9 16 16 14   CREATININE 0.47  --  0.53 0.59 0.68 0.81  CALCIUM 9.2  --  8.3* 8.4* 8.2* 9.0  MG  --  1.6* 2.4  --   --   --    GFR: Estimated Creatinine Clearance: 61.7 mL/min (by C-G formula based on SCr of 0.81 mg/dL). Liver Function Tests: Recent Labs  Lab 02/19/21 1802 02/24/21 0532  AST 38 20  ALT 20 19  ALKPHOS 39 34*  BILITOT 2.3* 0.8  PROT 6.0* 8.2*  ALBUMIN 3.4* 3.1*   No results for input(s): LIPASE, AMYLASE in the last 168 hours. No results for input(s): AMMONIA in  the last 168 hours. Coagulation Profile: No results for input(s): INR, PROTIME in the last 168 hours. Cardiac Enzymes: No results for input(s): CKTOTAL, CKMB, CKMBINDEX, TROPONINI in the last 168 hours. BNP (last 3 results) No results for input(s): PROBNP in  the last 8760 hours. HbA1C: No results for input(s): HGBA1C in the last 72 hours. CBG: No results for input(s): GLUCAP in the last 168 hours. Lipid Profile: No results for input(s): CHOL, HDL, LDLCALC, TRIG, CHOLHDL, LDLDIRECT in the last 72 hours. Thyroid Function Tests: No results for input(s): TSH, T4TOTAL, FREET4, T3FREE, THYROIDAB in the last 72 hours. Anemia Panel: Recent Labs    02/23/21 0409 02/24/21 0532  RETICCTPCT 15.8* 15.4*   Sepsis Labs: No results for input(s): PROCALCITON, LATICACIDVEN in the last 168 hours.  Recent Results (from the past 240 hour(s))  SARS CORONAVIRUS 2 (TAT 6-24 HRS) Nasopharyngeal Nasopharyngeal Swab     Status: None   Collection Time: 02/19/21 12:48 PM   Specimen: Nasopharyngeal Swab  Result Value Ref Range Status   SARS Coronavirus 2 NEGATIVE NEGATIVE Final    Comment: (NOTE) SARS-CoV-2 target nucleic acids are NOT DETECTED.  The SARS-CoV-2 RNA is generally detectable in upper and lower respiratory specimens during the acute phase of infection. Negative results do not preclude SARS-CoV-2 infection, do not rule out co-infections with other pathogens, and should not be used as the sole basis for treatment or other patient management decisions. Negative results must be combined with clinical observations, patient history, and epidemiological information. The expected result is Negative.  Fact Sheet for Patients: HairSlick.no  Fact Sheet for Healthcare Providers: quierodirigir.com  This test is not yet approved or cleared by the [place] FDA and  has been authorized for detection and/or diagnosis of SARS-CoV-2 by FDA  under an Emergency Use Authorization (EUA). This EUA will remain  in effect (meaning this test can be used) for the duration of the COVID-19 declaration under Se ction 564(b)(1) of the Act, 21 U.S.C. section 360bbb-3(b)(1), unless the authorization is terminated or revoked sooner.  Performed at [facility], [address], [place], [place] [address]       Imaging Studies   No results found.   Medications   Scheduled Meds: . famciclovir  250 mg Oral Daily  . fluconazole  100 mg Oral Daily  . folic acid  2 mg Oral Daily  . gabapentin  300 mg Oral QID  . levothyroxine  112 mcg Oral QAC breakfast  . predniSONE  60 mg Oral Q breakfast  . rivaroxaban  20 mg Oral Q supper  . sertraline  50 mg Oral Daily  . sodium chloride flush  3 mL Intravenous Q12H   Continuous Infusions: . sodium chloride    . sodium chloride    . famotidine (PEPCID) IV (ONCOLOGY)         LOS: 5 days    Time spent: 20 minutes    Pennie Banter, DO [facility]  02/24/2021, 7:38 PM      If 7PM-7AM, please contact night-coverage. How to contact the [facility] Attending or Consulting provider 7A - 7P or covering provider during after hours 7P -7A, for this patient?    1. Check the care team in [facility] and look for a) attending/consulting TR[facility] provider listed and b) the [facility] team listed 2. Log into [email or web address] and use [facility]'s universal password to access. If you do not have the password, please contact the hospital operator. 3. Locate the [facility] provider you are looking for under [facility] and page to a number that you can be directly reached. 4. If you still have difficulty reaching the provider, please page the [facility] (Director on Call) for the Hospitalists listed on amion for assistance.

## 2021-02-25 LAB — CBC
HCT: 33.1 % — ABNORMAL LOW (ref 36.0–46.0)
Hemoglobin: 11.3 g/dL — ABNORMAL LOW (ref 12.0–15.0)
MCH: 37.2 pg — ABNORMAL HIGH (ref 26.0–34.0)
MCHC: 34.1 g/dL (ref 30.0–36.0)
MCV: 108.9 fL — ABNORMAL HIGH (ref 80.0–100.0)
Platelets: 399 10*3/uL (ref 150–400)
RBC: 3.04 MIL/uL — ABNORMAL LOW (ref 3.87–5.11)
RDW: 19.4 % — ABNORMAL HIGH (ref 11.5–15.5)
WBC: 10.3 10*3/uL (ref 4.0–10.5)
nRBC: 1.4 % — ABNORMAL HIGH (ref 0.0–0.2)

## 2021-02-25 LAB — COMPREHENSIVE METABOLIC PANEL
ALT: 13 U/L (ref 0–44)
AST: 19 U/L (ref 15–41)
Albumin: 3.1 g/dL — ABNORMAL LOW (ref 3.5–5.0)
Alkaline Phosphatase: 37 U/L — ABNORMAL LOW (ref 38–126)
Anion gap: 8 (ref 5–15)
BUN: 16 mg/dL (ref 8–23)
CO2: 25 mmol/L (ref 22–32)
Calcium: 9.2 mg/dL (ref 8.9–10.3)
Chloride: 105 mmol/L (ref 98–111)
Creatinine, Ser: 0.63 mg/dL (ref 0.44–1.00)
GFR, Estimated: >60 mL/min (ref >60)
Glucose, Bld: 90 mg/dL (ref 70–99)
Potassium: 4 mmol/L (ref 3.5–5.1)
Sodium: 138 mmol/L (ref 135–145)
Total Bilirubin: 1 mg/dL (ref 0.3–1.2)
Total Protein: 8.3 g/dL — ABNORMAL HIGH (ref 6.5–8.1)

## 2021-02-25 LAB — RETICULOCYTES
Immature Retic Fract: 27.9 % — ABNORMAL HIGH (ref 2.3–15.9)
RBC.: 3.03 MIL/uL — ABNORMAL LOW (ref 3.87–5.11)
Retic Count, Absolute: 375 10*3/uL — ABNORMAL HIGH (ref 19.0–186.0)
Retic Ct Pct: 11.9 % — ABNORMAL HIGH (ref 0.4–3.1)

## 2021-02-25 LAB — LACTATE DEHYDROGENASE: LDH: 256 U/L — ABNORMAL HIGH (ref 98–192)

## 2021-02-25 MED ORDER — POLYETHYLENE GLYCOL 3350 17 G PO PACK: 17.0000 g | PACK | Freq: Every day | ORAL | 0 refills | Status: DC | PRN

## 2021-02-25 MED ORDER — FLUCONAZOLE 100 MG PO TABS
100.0000 mg | ORAL_TABLET | Freq: Every day | ORAL | 0 refills | Status: DC
  Filled 2021-03-06: qty 30, 30d supply, fill #0

## 2021-02-25 MED ORDER — FAMCICLOVIR 500 MG PO TABS
250.0000 mg | ORAL_TABLET | Freq: Every day | ORAL | 2 refills | Status: DC
  Filled 2021-03-06: qty 15, 30d supply, fill #0

## 2021-02-25 MED ORDER — FOLIC ACID 1 MG PO TABS: 2.0000 mg | ORAL_TABLET | Freq: Every day | ORAL | Status: AC

## 2021-02-25 MED ORDER — PREDNISONE 20 MG PO TABS
60.0000 mg | ORAL_TABLET | Freq: Every day | ORAL | 0 refills | Status: DC
  Filled 2021-03-06: qty 90, 30d supply, fill #0

## 2021-02-25 NOTE — TOC Transition Note (Signed)
Transition of Care [facility]) - CM/SW Discharge Note   Patient Details  Name: [name] MRN: [id number] Date of Birth: 10/12/1945  Transition of Care [facility]) CM/SW Contact:  Bartholome Bill, RN Phone Number: 02/25/2021, 10:51 AM   Clinical Narrative:     Pt was active with [facility] for home health services prior to admission. Liaison alerted of resumption orders for dc.  Final next level of care: Home/Self Care Barriers to Discharge: No Barriers Identified   Patient Goals and CMS Choice Patient states their goals for this hospitalization and ongoing recovery are:: To get better CMS Medicare.gov Compare Post Acute Care list provided to:: Patient Choice offered to / list presented to : Patient

## 2021-02-25 NOTE — Progress Notes (Signed)
Ms. [name] got her Rituxan yesterday.  She did well with that.  I appreciate the efforts everyone made for her to get the Rituxan yesterday.  I know I will help her out.  Again she has had no reaction to the Rituxan.  She feels well.  She would like to go home.  I do not see any reason why she cannot go home.  She is doing nicely on the prednisone.  She is on 60 mg a day.  Is on folic acid 2 mg a day.  Her hemoglobin is 11.3.  This is come up quite nicely over the past several days.  Her LDH has come down to 256.  I do not have back the reticulocyte count as of yet.  Her blood sugar is only 90.  As such the prednisone really has not affected this.  Her creatinine is 0.63.  She has had no problems with bleeding.  She is on the Xarelto.  We will continue her on the Xarelto 20 mg.  She will have the Rituxan as an outpatient next week in the office.  We will give her a call about this.  She has had no headache.  There is no fever.  She has had no problems going to the bathroom.  Her vital signs are all stable.  Temperature is 98.  Pulse 66.  Blood pressure 135/66.  Oral exam shows no thrush.  Lungs are clear bilaterally.  Cardiac exam regular rate and rhythm with no murmurs, rubs or bruits.  Abdomen is soft.  She has good bowel sounds.  There is no fluid wave.  Extremities shows no clubbing, cyanosis or edema.  Neurological exam is nonfocal.  Again, Ms. [name] has a relapse of the autoimmune hemolytic anemia.  We have her back on prednisone.  She received IVIG.  She also started Rituxan.  Her hemoglobin has responded very nicely.  I will think the Rituxan should be able to keep her in remission.  We will slowly taper the prednisone.  We will keep her on 60 mg daily until we see her back next week.  I am so appreciative of the outstanding care that she got from all the staff up on 6 E.  I really appreciate the staff being able to administer the Rituxan yesterday.  Christin Bach, MD  Psalm 116:2

## 2021-02-25 NOTE — Discharge Summary (Signed)
Physician Discharge Summary  DREMA EDDINGTON WNU:[id number] DOB: Aug 11, 1946 DOA: 02/19/2021  PCP: Burton Apley, MD  Admit date: 02/19/2021 Discharge date: 02/25/2021  Admitted From: Home Disposition: Home  Recommendations for Outpatient Follow-up:  1. Follow up with PCP in 1-2 weeks 2. Please obtain BMP/CBC in one week Please follow up with Dr. Myna Hidalgo next week Home Health yes Equipment/Devices: None Discharge Condition stable CODE STATUS: Full code Diet recommendation: Cardiac Brief/Interim Summary:75 year old female with past medical history of hypothyroidism,anxiety,idiopathic warm autoimmune hemolytic anemia, DVT of the R peroneal vein on Xarelto with recent hospitalization from 4/15-4/25 at which point she was diagnosed with autoimmune hemolytic anemia,  treated with prednisone and IVIG with excellent response (Hbg 12.7 at hematology follow up 3 weeks ago).  She  unfortunately had an acute ischemic stroke thought to be due to her anemia.    She presented to [facility] ED with anxiety, progressive shortness of breath and intermittent chest pain x1 week. Found to have Hbg of 6.6, retic cout 15.6%, Tbili 2.3 and LDH 481.    She was admitted for further management of recurrent warm autoimmune hemolytic anemia.   Transfused 2 units pRBC's on admission. Dr. Myna Hidalgo with hematology is following.  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  Presented with Hbg 6.6, down from 12.7 on 01/29/21. --Dr. Myna Hidalgo with heme/oncology saw the patient on consult.  She was treated with prednisone 60 mg daily with Rituxan.  She was also given 2 units of packed RBCs.  Her  hemoglobin on the day of discharge was 11.3. She received 1 dose of Rituxan during this hospital stay.  She will have the rest of the Rituxan as outpatient.  She should follow-up with Dr. Gustavo Lah office next week for repeat blood work and for [employer].  She tolerated her dose of Rituxan during the hospital stay without any side effects or reactions.  Her hemoglobin responded well.  Her LDH was trending down.  Headache / Hx of Migraines - takes Norco at home for HA's  Left popliteal vein DVT - diagnosed 01/06/21, on Xarelto. --Continue Xarelto  Anxiety with panic attacks - cont home Zoloft.    Polyneuropathy - cont gabapentin  Hypothyroidism - cont Synthroid  Recent hx of acute ischemic CVA - thought due to severe anemia.  Hypokalemia -resolved potassium 4.0..   Estimated body mass index is 25.79 kg/m as calculated from the following:   Height as of this encounter:  (1.651 m).   Weight as of this encounter: 70.3 kg.  Discharge Instructions  Discharge Instructions    Diet - low sodium heart healthy   Complete by: As directed    Increase activity slowly   Complete by: As directed    PHYSICIAN COMMUNICATION ORDER   Complete by: As directed    Hepatitis B Virus screening with HBsAg and anti-HBc recommended prior to treatment with rituximab, ofatumumab, or obinutuzumab.   SCHEDULING COMMUNICATION   Complete by: As directed    Chemotherapy Appointment - 5.5 hr     Allergies as of 02/25/2021      Reactions   Aspirin    Avoid due to stomach issues   Cephalexin  Stomach cramps   Imipenem    Warm and tingly all over   Losartan Potassium-hctz    Other reaction(s): do not remember   Oxycodone    Hallucinations    Statins    leg pain, muscle cramps   Clindamycin/lincomycin Nausea And Vomiting, Rash   Contrast Media [iodinated Diagnostic Agents] Rash   Penicillins Rash   Did it involve swelling of the face/tongue/throat, SOB, or low BP? No Did it involve sudden or  severe rash/hives, skin peeling, or any reaction on the inside of your mouth or nose? Yes Did you need to seek medical attention at a hospital or doctor's office? No When did it last happen?2011 If all above answers are "NO", may proceed with cephalosporin use.   Quinolones Rash      Medication List    TAKE these medications   acetaminophen 500 MG tablet Commonly known as: TYLENOL Take 1,000 mg by mouth every 6 (six) hours as needed for headache.   Calcium Carbonate+Vitamin D 600-200 MG-UNIT Tabs Take 2 tablets by mouth 2 (two) times daily.   famciclovir 500 MG tablet Commonly known as: FAMVIR Take 0.5 tablets (250 mg total) by mouth daily. What changed: how much to take   fluconazole 100 MG tablet Commonly known as: DIFLUCAN Take 1 tablet (100 mg total) by mouth daily.   folic acid 1 MG tablet Commonly known as: FOLVITE Take 2 tablets (2 mg total) by mouth daily.   gabapentin 300 MG capsule Commonly known as: NEURONTIN TAKE 1 CAPSULE BY MOUTH IN  THE MORNING , 1 CAPSULE BY  MOUTH IN THE AFTERNOON ,  AND 2 CAPSULES BEFORE  BEDTIME What changed:   how much to take  how to take this  when to take this  additional instructions   HYDROcodone-acetaminophen 5-325 MG tablet Commonly known as: NORCO/VICODIN Take 1 tablet by mouth.   levothyroxine 112 MCG tablet Commonly known as: SYNTHROID Take 112 mcg by mouth daily before breakfast.   lidocaine-prilocaine cream Commonly known as: EMLA Apply to affected area once   LORazepam 2 MG tablet Commonly known as: ATIVAN Take 2 mg by mouth 3 (three) times daily as needed for anxiety.   multivitamin with minerals tablet Take 1 tablet by mouth daily.   pantoprazole 40 MG tablet Commonly known as: PROTONIX Take 1 tablet (40 mg total) by mouth 2 (two) times daily. What changed:   when to take this  reasons to take this   polycarbophil 625 MG tablet Commonly known as: FIBERCON Take 1,250 mg by mouth daily.    polyethylene glycol 17 g packet Commonly known as: MIRALAX / GLYCOLAX Take 17 g by mouth daily as needed for mild constipation.   polyvinyl alcohol 1.4 % ophthalmic solution Commonly known as: LIQUIFILM TEARS Place 1 drop into both eyes as needed for dry eyes.   predniSONE 20 MG tablet Commonly known as: DELTASONE Take 3 tablets (60 mg total) by mouth daily with breakfast. Start taking on: February 26, 2021   rivaroxaban 20 MG Tabs tablet Commonly known as: XARELTO Take 1 tablet (20 mg total) by mouth daily with supper.   sertraline 50 MG tablet Commonly known as: ZOLOFT Take 50 mg by mouth daily.   sucralfate 1 g tablet Commonly known as: CARAFATE Take 1 g by mouth 2 (two) times daily.       Follow-up Information    Burton Apley, MD Follow up.   Specialty: Internal Medicine Contact information: [address] [place]  [address] [phone]        Josph Macho, MD Follow up.   Specialty: Oncology Contact information: [address] [place] [address] [phone]              Allergies  Allergen Reactions  . Aspirin     Avoid due to stomach issues  . Cephalexin     Stomach cramps  . Imipenem     Warm and tingly all over  . Losartan Potassium-Hctz     Other reaction(s): do not remember  . Oxycodone     Hallucinations   . Statins     leg pain, muscle cramps  . Clindamycin/Lincomycin Nausea And Vomiting and Rash  . Contrast Media [Iodinated Diagnostic Agents] Rash  . Penicillins Rash    Did it involve swelling of the face/tongue/throat, SOB, or low BP? No Did it involve sudden or severe rash/hives, skin peeling, or any reaction on the inside of your mouth or nose? Yes Did you need to seek medical attention at a hospital or doctor's office? No When did it last happen?2011 If all above answers are "NO", may proceed with cephalosporin use.   . Quinolones Rash    Consultations: Oncology Dr.  Myna Hidalgo  Procedures/Studies: DG Chest 2 View  Result Date: 02/19/2021 CLINICAL DATA:  Chest pain and shortness of breath EXAM: CHEST - 2 VIEW COMPARISON:  January 02, 2021 FINDINGS: There is minimal atelectasis in the left base. The lungs elsewhere are clear. Heart size and pulmonary vascularity are normal. There is aortic atherosclerosis. No adenopathy. 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 IMPRESSION: Minimal left base atelectasis. Lungs elsewhere clear. Heart size normal. Aortic Atherosclerosis (ICD10-I70.0). Electronically Signed   By: Bretta Bang III M.D.   On: 02/19/2021 09:38   VAS [place] LOWER EXTREMITY VENOUS (DVT)  Result Date: 02/21/2021  Lower Venous DVT Study Patient Name:  CYANN VENTI  Date of Exam:   02/20/2021 Medical Rec #: [id number]       Accession #:    [id number] Date of Birth: 30-Jul-1946       Patient Gender: F Patient Age:   74Y Exam Location:  [facility] Procedure:      VAS [place] LOWER EXTREMITY VENOUS (DVT) Referring Phys: [id number] PETER R ENNEVER --------------------------------------------------------------------------------  Indications: Recent right peroneal DVT, 01/06/21. Other Indications: Autoimmune hemolytic anemia. Comparison Study: Prior study from 01/06/21 is available for comparison Performing Technologist: Sherren Kerns RVS  Examination Guidelines: A complete evaluation includes B-mode imaging, spectral Doppler, color Doppler, and power Doppler as needed of all accessible portions of each vessel. Bilateral testing is considered an integral part of a complete examination. Limited examinations for reoccurring indications may be performed as noted. The reflux portion of the exam is performed with the patient in reverse Trendelenburg.  +---------+---------------+---------+-----------+----------+--------------+ RIGHT    CompressibilityPhasicitySpontaneityPropertiesThrombus Aging +---------+---------------+---------+-----------+----------+--------------+  CFV      Full           Yes      Yes                                 +---------+---------------+---------+-----------+----------+--------------+ SFJ      Full                                                        +---------+---------------+---------+-----------+----------+--------------+  FV Prox  Full                                                        +---------+---------------+---------+-----------+----------+--------------+ FV Mid   Full                                                        +---------+---------------+---------+-----------+----------+--------------+ FV DistalFull                                                        +---------+---------------+---------+-----------+----------+--------------+ PFV      Full                                                        +---------+---------------+---------+-----------+----------+--------------+ POP      Full           Yes      Yes                                 +---------+---------------+---------+-----------+----------+--------------+ PTV      Full                                                        +---------+---------------+---------+-----------+----------+--------------+ PERO     Full                                                        +---------+---------------+---------+-----------+----------+--------------+ Rouleaux flow noted throughout  +----+---------------+---------+-----------+----------+--------------+ LEFTCompressibilityPhasicitySpontaneityPropertiesThrombus Aging +----+---------------+---------+-----------+----------+--------------+ CFV Full           Yes      Yes                                 +----+---------------+---------+-----------+----------+--------------+ Rouleaux flow noted in the common femoral vein    Summary: RIGHT: - Findings appear improved from previous examination. - There is no evidence of deep vein thrombosis in the lower extremity.   LEFT: - No evidence of common femoral vein obstruction.  *See table(s) above for measurements and observations. Electronically signed by Fabienne Brunsharles Fields MD on 02/21/2021 at 11:57:14 AM.    Final     (Echo, Carotid, EGD, Colonoscopy, ERCP)    Subjective:  Patient sitting up in bed talking to family on the phone awake alert no complaints anxious to go home Discharge Exam: Vitals:   02/24/21 2323 02/25/21 0700  BP: (!) 107/59 135/66  Pulse: (!) 50 66  Resp: 17   Temp: 98 F (36.7 C) 98 F (36.7 C)  SpO2: 98% 98%   Vitals:   02/24/21 1259 02/24/21 1340 02/24/21 2323 02/25/21 0700  BP: 117/71 (!) 123/58 (!) 107/59 135/66  Pulse: (!) 57 64 (!) 50 66  Resp: 20 17 17    Temp: 98 F (36.7 C) 98 F (36.7 C) 98 F (36.7 C) 98 F (36.7 C)  TempSrc: Oral Oral Oral Oral  SpO2: 96% 95% 98% 98%  Weight:      Height:        General: Pt is alert, awake, not in acute distress Cardiovascular: RRR, S1/S2 +, no rubs, no gallops Respiratory: CTA bilaterally, no wheezing, no rhonchi Abdominal: Soft, NT, ND, bowel sounds + Extremities: no edema, no cyanosis    The results of significant diagnostics from this hospitalization (including imaging, microbiology, ancillary and laboratory) are listed below for reference.     Microbiology: Recent Results (from the past 240 hour(s))  SARS CORONAVIRUS 2 (TAT 6-24 HRS) Nasopharyngeal Nasopharyngeal Swab     Status: None   Collection Time: 02/19/21 12:48 PM   Specimen: Nasopharyngeal Swab  Result Value Ref Range Status   SARS Coronavirus 2 NEGATIVE NEGATIVE Final    Comment: (NOTE) SARS-CoV-2 target nucleic acids are NOT DETECTED.  The SARS-CoV-2 RNA is generally detectable in upper and lower respiratory specimens during the acute phase of infection. Negative results do not preclude SARS-CoV-2 infection, do not rule out co-infections with other pathogens, and should not be used as the sole basis for treatment or other patient management  decisions. Negative results must be combined with clinical observations, patient history, and epidemiological information. The expected result is Negative.  Fact Sheet for Patients: 04/21/21  Fact Sheet for Healthcare Providers: HairSlick.no  This test is not yet approved or cleared by the [place] FDA and  has been authorized for detection and/or diagnosis of SARS-CoV-2 by FDA under an Emergency Use Authorization (EUA). This EUA will remain  in effect (meaning this test can be used) for the duration of the COVID-19 declaration under Se ction 564(b)(1) of the Act, 21 U.S.C. section 360bbb-3(b)(1), unless the authorization is terminated or revoked sooner.  Performed at [facility], [address] [place], [place]      Labs: BNP (last 3 results) No results for input(s): BNP in the last 8760 hours. Basic Metabolic Panel: Recent Labs  Lab 02/19/21 1802 02/20/21 0510 02/21/21 0349 02/22/21 0324 02/24/21 0532 02/25/21 0605  NA  --  141 139 135 135 138  K  --  4.1 3.7 4.7 3.7 4.0  CL  --  111 111 108 103 105  CO2  --  22 21* 23 24 25   GLUCOSE  --  160* 93 152* 93 90  BUN  --  9 16 16 14 16   CREATININE  --  0.53 0.59 0.68 0.81 0.63  CALCIUM  --  8.3* 8.4* 8.2* 9.0 9.2  MG 1.6* 2.4  --   --   --   --    Liver Function Tests: Recent Labs  Lab 02/19/21 1802 02/24/21 0532 02/25/21 0605  AST 38 20 19  ALT 20 19 13   ALKPHOS 39 34* 37*  BILITOT 2.3* 0.8 1.0  PROT 6.0* 8.2* 8.3*  ALBUMIN 3.4* 3.1* 3.1*   No results for input(s): LIPASE, AMYLASE in the last 168 hours. No results for input(s): AMMONIA in the last 168 hours. CBC: Recent Labs  Lab 02/21/21 0349 02/21/21 1202 02/21/21 1848 02/22/21 0324 02/23/21 0409 02/24/21 0532 02/25/21 0605  WBC 10.1  --   --  8.0 8.3 10.6* 10.3  HGB 8.3*   < > 9.6* 7.9* 8.7* 10.6* 11.3*  HCT 26.4*   < > 29.4* 25.3* 27.0* 31.3* 33.1*  MCV  119.5*  --   --  118.8* 115.9* 112.6* 108.9*  PLT 344  --   --  332 338 406* 399   < > = values in this interval not displayed.   Cardiac Enzymes: No results for input(s): CKTOTAL, CKMB, CKMBINDEX, TROPONINI in the last 168 hours. BNP: Invalid input(s): POCBNP CBG: No results for input(s): GLUCAP in the last 168 hours. D-Dimer No results for input(s): DDIMER in the last 72 hours. Hgb A1c No results for input(s): HGBA1C in the last 72 hours. Lipid Profile No results for input(s): CHOL, HDL, LDLCALC, TRIG, CHOLHDL, LDLDIRECT in the last 72 hours. Thyroid function studies No results for input(s): TSH, T4TOTAL, T3FREE, THYROIDAB in the last 72 hours.  Invalid input(s): FREET3 Anemia work up Recent Labs    02/24/21 0532 02/25/21 0605  RETICCTPCT 15.4* 11.9*   Urinalysis    Component Value Date/Time   COLORURINE AMBER (A) 01/02/2021 1900   APPEARANCEUR HAZY (A) 01/02/2021 1900   LABSPEC 1.017 01/02/2021 1900   PHURINE 5.0 01/02/2021 1900   GLUCOSEU NEGATIVE 01/02/2021 1900   HGBUR NEGATIVE 01/02/2021 1900   BILIRUBINUR NEGATIVE 01/02/2021 1900   KETONESUR NEGATIVE 01/02/2021 1900   PROTEINUR NEGATIVE 01/02/2021 1900   UROBILINOGEN 0.2 06/28/2010 0430   NITRITE NEGATIVE 01/02/2021 1900   LEUKOCYTESUR LARGE (A) 01/02/2021 1900   Sepsis Labs Invalid input(s): PROCALCITONIN,  WBC,  LACTICIDVEN Microbiology Recent Results (from the past 240 hour(s))  SARS CORONAVIRUS 2 (TAT 6-24 HRS) Nasopharyngeal Nasopharyngeal Swab     Status: None   Collection Time: 02/19/21 12:48 PM   Specimen: Nasopharyngeal Swab  Result Value Ref Range Status   SARS Coronavirus 2 NEGATIVE NEGATIVE Final    Comment: (NOTE) SARS-CoV-2 target nucleic acids are NOT DETECTED.  The SARS-CoV-2 RNA is generally detectable in upper and lower respiratory specimens during the acute phase of infection. Negative results do not preclude SARS-CoV-2 infection, do not rule out co-infections with other pathogens,  and should not be used as the sole basis for treatment or other patient management decisions. Negative results must be combined with clinical observations, patient history, and epidemiological information. The expected result is Negative.  Fact Sheet for Patients: HairSlick.no  Fact Sheet for Healthcare Providers: quierodirigir.com  This test is not yet approved or cleared by the [place] FDA and  has been authorized for detection and/or diagnosis of SARS-CoV-2 by FDA under an Emergency Use Authorization (EUA). This EUA will remain  in effect (meaning this test can be used) for the duration of the COVID-19 declaration under Se ction 564(b)(1) of the Act, 21 U.S.C. section 360bbb-3(b)(1), unless the authorization is terminated or revoked sooner.  Performed at [facility], [address] [place], [place] [address]      Time coordinating discharge: 36 minutes  SIGNED:   Alwyn Ren, MD  [facility] 02/25/2021, 10:32 AM

## 2021-02-25 NOTE — Progress Notes (Signed)
Pt discharged home with all belongings. Discharge education complete. No questions or concerns at this time. Encouraged pt to follow up with PCP if any concerns arise following discharge.

## 2021-02-27 ENCOUNTER — Encounter: Payer: Self-pay | Admitting: *Deleted

## 2021-02-27 NOTE — Progress Notes (Signed)
Records faxed to [facility] per Request

## 2021-03-05 ENCOUNTER — Other Ambulatory Visit (HOSPITAL_BASED_OUTPATIENT_CLINIC_OR_DEPARTMENT_OTHER): Payer: Self-pay

## 2021-03-05 ENCOUNTER — Inpatient Hospital Stay: Payer: Medicare Other | Attending: Hematology & Oncology

## 2021-03-05 ENCOUNTER — Telehealth: Payer: Self-pay

## 2021-03-05 ENCOUNTER — Encounter: Payer: Self-pay | Admitting: Hematology & Oncology

## 2021-03-05 ENCOUNTER — Inpatient Hospital Stay: Payer: Medicare Other | Admitting: Hematology & Oncology

## 2021-03-05 ENCOUNTER — Other Ambulatory Visit: Payer: Self-pay

## 2021-03-05 ENCOUNTER — Inpatient Hospital Stay: Payer: Medicare Other

## 2021-03-05 VITALS — BP 120/69 | HR 64 | Temp 99.0°F | Resp 18 | Wt 159.0 lb

## 2021-03-05 VITALS — BP 121/55 | HR 55

## 2021-03-05 DIAGNOSIS — Z5112 Encounter for antineoplastic immunotherapy: Secondary | ICD-10-CM | POA: Insufficient documentation

## 2021-03-05 DIAGNOSIS — G44209 Tension-type headache, unspecified, not intractable: Secondary | ICD-10-CM

## 2021-03-05 DIAGNOSIS — Z79899 Other long term (current) drug therapy: Secondary | ICD-10-CM | POA: Diagnosis not present

## 2021-03-05 DIAGNOSIS — E038 Other specified hypothyroidism: Secondary | ICD-10-CM | POA: Diagnosis not present

## 2021-03-05 DIAGNOSIS — E039 Hypothyroidism, unspecified: Secondary | ICD-10-CM | POA: Diagnosis not present

## 2021-03-05 DIAGNOSIS — D589 Hereditary hemolytic anemia, unspecified: Secondary | ICD-10-CM

## 2021-03-05 DIAGNOSIS — D591 Autoimmune hemolytic anemia, unspecified: Secondary | ICD-10-CM

## 2021-03-05 DIAGNOSIS — D5911 Warm autoimmune hemolytic anemia: Secondary | ICD-10-CM | POA: Diagnosis present

## 2021-03-05 DIAGNOSIS — I82401 Acute embolism and thrombosis of unspecified deep veins of right lower extremity: Secondary | ICD-10-CM

## 2021-03-05 LAB — CBC WITH DIFFERENTIAL (CANCER CENTER ONLY)
Abs Immature Granulocytes: 0.13 10*3/uL — ABNORMAL HIGH (ref 0.00–0.07)
Basophils Absolute: 0 10*3/uL (ref 0.0–0.1)
Basophils Relative: 0 %
Eosinophils Absolute: 0 10*3/uL (ref 0.0–0.5)
Eosinophils Relative: 0 %
HCT: 36.5 % (ref 36.0–46.0)
Hemoglobin: 13 g/dL (ref 12.0–15.0)
Immature Granulocytes: 1 %
Lymphocytes Relative: 6 %
Lymphs Abs: 0.9 10*3/uL (ref 0.7–4.0)
MCH: 35.5 pg — ABNORMAL HIGH (ref 26.0–34.0)
MCHC: 35.6 g/dL (ref 30.0–36.0)
MCV: 99.7 fL (ref 80.0–100.0)
Monocytes Absolute: 0.7 10*3/uL (ref 0.1–1.0)
Monocytes Relative: 4 %
Neutro Abs: 13.9 10*3/uL — ABNORMAL HIGH (ref 1.7–7.7)
Neutrophils Relative %: 89 %
Platelet Count: 433 10*3/uL — ABNORMAL HIGH (ref 150–400)
RBC: 3.66 MIL/uL — ABNORMAL LOW (ref 3.87–5.11)
RDW: 14.7 % (ref 11.5–15.5)
WBC Count: 15.7 10*3/uL — ABNORMAL HIGH (ref 4.0–10.5)
nRBC: 0 % (ref 0.0–0.2)

## 2021-03-05 LAB — CMP (CANCER CENTER ONLY)
ALT: 18 U/L (ref 0–44)
AST: 18 U/L (ref 15–41)
Albumin: 3.9 g/dL (ref 3.5–5.0)
Alkaline Phosphatase: 40 U/L (ref 38–126)
Anion gap: 8 (ref 5–15)
BUN: 16 mg/dL (ref 8–23)
CO2: 26 mmol/L (ref 22–32)
Calcium: 10.5 mg/dL — ABNORMAL HIGH (ref 8.9–10.3)
Chloride: 99 mmol/L (ref 98–111)
Creatinine: 0.73 mg/dL (ref 0.44–1.00)
GFR, Estimated: >60 mL/min (ref >60)
Glucose, Bld: 99 mg/dL (ref 70–99)
Potassium: 4.3 mmol/L (ref 3.5–5.1)
Sodium: 133 mmol/L — ABNORMAL LOW (ref 135–145)
Total Bilirubin: 0.7 mg/dL (ref 0.3–1.2)
Total Protein: 7.2 g/dL (ref 6.5–8.1)

## 2021-03-05 LAB — HEPATITIS B CORE ANTIBODY, TOTAL: Hep B Core Total Ab: REACTIVE — AB

## 2021-03-05 LAB — LACTATE DEHYDROGENASE: LDH: 223 U/L — ABNORMAL HIGH (ref 98–192)

## 2021-03-05 LAB — RETICULOCYTES
Immature Retic Fract: 15.9 % (ref 2.3–15.9)
RBC.: 3.76 MIL/uL — ABNORMAL LOW (ref 3.87–5.11)
Retic Count, Absolute: 149.3 10*3/uL (ref 19.0–186.0)
Retic Ct Pct: 4 % — ABNORMAL HIGH (ref 0.4–3.1)

## 2021-03-05 LAB — HEPATITIS B SURFACE ANTIGEN: Hepatitis B Surface Ag: NONREACTIVE

## 2021-03-05 MED ORDER — DIPHENHYDRAMINE HCL 25 MG PO CAPS
ORAL_CAPSULE | ORAL | Status: AC
  Filled 2021-03-05: qty 2

## 2021-03-05 MED ORDER — HEPARIN SOD (PORK) LOCK FLUSH 100 UNIT/ML IV SOLN
500.0000 [IU] | Freq: Once | INTRAVENOUS | Status: DC | PRN
  Filled 2021-03-05: qty 5

## 2021-03-05 MED ORDER — DIPHENHYDRAMINE HCL 25 MG PO CAPS
50.0000 mg | ORAL_CAPSULE | Freq: Once | ORAL | Status: AC
  Administered 2021-03-05: 50 mg via ORAL

## 2021-03-05 MED ORDER — SODIUM CHLORIDE 0.9 % IV SOLN
Freq: Once | INTRAVENOUS | Status: AC
  Filled 2021-03-05: qty 250

## 2021-03-05 MED ORDER — SODIUM CHLORIDE 0.9 % IV SOLN: 375.0000 mg/m2 | Freq: Once | INTRAVENOUS | Status: DC

## 2021-03-05 MED ORDER — SODIUM CHLORIDE 0.9 % IV SOLN
375.0000 mg/m2 | Freq: Once | INTRAVENOUS | Status: AC
Start: 2021-03-05 — End: 2021-03-05
  Administered 2021-03-05: 700 mg via INTRAVENOUS
  Filled 2021-03-05: qty 20

## 2021-03-05 MED ORDER — ACETAMINOPHEN 325 MG PO TABS
ORAL_TABLET | ORAL | Status: AC
  Filled 2021-03-05: qty 2

## 2021-03-05 MED ORDER — ACETAMINOPHEN 325 MG PO TABS
650.0000 mg | ORAL_TABLET | Freq: Once | ORAL | Status: AC
  Administered 2021-03-05: 650 mg via ORAL

## 2021-03-05 NOTE — Telephone Encounter (Signed)
Appts made per 03/05/21 los and pt to gain sch in chemo/tx and through my chart   [name]

## 2021-03-05 NOTE — Progress Notes (Signed)
Rapid Infusion Rituximab Pharmacist Evaluation  [name] is a 75 y.o. female being treated with rituximab for autoimmune hemolytic anemia. This patient may be considered for RIR.   A pharmacist has verified the patient tolerated rituximab infusions per the [facility] standard infusion protocol without grade 3-4 infusion reactions. The treatment plan will be updated to reflect RIR if the patient qualifies per the checklist below:   Age > 51 years old Yes   Clinically significant cardiovascular disease No   Circulating lymphocyte count < 5000/uL prior to cycle two Yes  Lab Results  Component Value Date   LYMPHSABS 0.9 03/05/2021    Prior documented grade 3-4 infusion reaction to rituximab No   Prior documented grade 1-2 infusion reaction to rituximab (If YES, Pharmacist will confirm with Physician if patient is still a candidate for RIR) No   Previous rituximab infusion within the past 6 months Yes   Treatment Plan updated orders to reflect RIR Yes    [name] [name] does meet the criteria for Rapid Infusion Rituximab. This patient is going to be switched to rapid infusion rituximab.   Jaclyne Haverstick, Nevin Bloodgood 03/05/21 11:26 AM

## 2021-03-05 NOTE — Patient Instructions (Signed)
[facility]  Discharge Instructions: Thank you for choosing [facility] to provide your oncology and hematology care.   If you have a lab appointment with the Cancer Center, please go directly to the Cancer Center and check in at the registration area.  Wear comfortable clothing and clothing appropriate for easy access to any Portacath or PICC line.   We strive to give you quality time with your provider. You may need to reschedule your appointment if you arrive late (15 or more minutes).  Arriving late affects you and other patients whose appointments are after yours.  Also, if you miss three or more appointments without notifying the office, you may be dismissed from the clinic at the provider's discretion.      For prescription refill requests, have your pharmacy contact our office and allow 72 hours for refills to be completed.    Today you received the following chemotherapy and/or immunotherapy agents Rituxan      To help prevent nausea and vomiting after your treatment, we encourage you to take your nausea medication as directed.  BELOW ARE SYMPTOMS THAT SHOULD BE REPORTED IMMEDIATELY: *FEVER GREATER THAN 100.4 F (38 C) OR HIGHER *CHILLS OR SWEATING *NAUSEA AND VOMITING THAT IS NOT CONTROLLED WITH YOUR NAUSEA MEDICATION *UNUSUAL SHORTNESS OF BREATH *UNUSUAL BRUISING OR BLEEDING *URINARY PROBLEMS (pain or burning when urinating, or frequent urination) *BOWEL PROBLEMS (unusual diarrhea, constipation, pain near the anus) TENDERNESS IN MOUTH AND THROAT WITH OR WITHOUT PRESENCE OF ULCERS (sore throat, sores in mouth, or a toothache) UNUSUAL RASH, SWELLING OR PAIN  UNUSUAL VAGINAL DISCHARGE OR ITCHING   Items with * indicate a potential emergency and should be followed up as soon as possible or go to the Emergency Department if any problems should occur.  Please show the CHEMOTHERAPY ALERT CARD or IMMUNOTHERAPY ALERT CARD at check-in to the  Emergency Department and triage nurse. Should you have questions after your visit or need to cancel or reschedule your appointment, please contact [facility]  [phone] and follow the prompts.  Office hours are 8:00 a.m. to 4:30 p.m. Monday - Friday. Please note that voicemails left after 4:00 p.m. may not be returned until the following business day.  We are closed weekends and major holidays. You have access to a nurse at all times for urgent questions. Please call the main number to the clinic [phone] and follow the prompts.  For any non-urgent questions, you may also contact your provider using MyChart. We now offer e-Visits for anyone 18 and older to request care online for non-urgent symptoms. For details visit [email or web address].   Also download the MyChart app! Go to the app store, search "MyChart", open the app, select [facility], and log in with your MyChart username and password.  Due to Covid, a mask is required upon entering the hospital/clinic. If you do not have a mask, one will be given to you upon arrival. For doctor visits, patients may have 1 support person aged 18 or older with them. For treatment visits, patients cannot have anyone with them due to current Covid guidelines and our immunocompromised population.   Rituximab Injection What is this medication? RITUXIMAB (ri TUX i mab) is a monoclonal antibody. It is used to treat certain types of cancer like non-Hodgkin lymphoma and chronic lymphocytic leukemia. It is also used to treat rheumatoid arthritis, granulomatosis with polyangiitis, microscopic polyangiitis, and pemphigus vulgaris. This medicine may be used for   other purposes; ask your health care provider or pharmacist if you have questions. COMMON BRAND NAME(S): RIABNI, Rituxan, RUXIENCE What should I tell my care team before I take this medication? They need to know if you have any of these conditions: chest pain heart  disease infection especially a viral infection such as chickenpox, cold sores, hepatitis B, or herpes immune system problems irregular heartbeat or rhythm kidney disease low blood counts (white cells, platelets, or red cells) lung disease recent or upcoming vaccine an unusual or allergic reaction to rituximab, other medicines, foods, dyes, or preservatives pregnant or trying to get pregnant breast-feeding How should I use this medication? This medicine is injected into a vein. It is given by a health care provider in a hospital or clinic setting. A special MedGuide will be given to you before each treatment. Be sure to read this information carefully each time. Talk to your health care provider about the use of this medicine in children. While this drug may be prescribed for children as young as 6 months for selected conditions, precautions do apply. Overdosage: If you think you have taken too much of this medicine contact a poison control center or emergency room at once. NOTE: This medicine is only for you. Do not share this medicine with others. What if I miss a dose? Keep appointments for follow-up doses. It is important not to miss your dose. Call your health care provider if you are unable to keep an appointment. What may interact with this medication? Do not take this medicine with any of the following medicines: live vaccines This medicine may also interact with the following medicines: cisplatin This list may not describe all possible interactions. Give your health care provider a list of all the medicines, herbs, non-prescription drugs, or dietary supplements you use. Also tell them if you smoke, drink alcohol, or use illegal drugs. Some items may interact with your medicine. What should I watch for while using this medication? Your condition will be monitored carefully while you are receiving this medicine. You may need blood work done while you are taking this medicine. This  medicine can cause serious infusion reactions. To reduce the risk your health care provider may give you other medicines to take before receiving this one. Be sure to follow the directions from your health care provider. This medicine may increase your risk of getting an infection. Call your health care provider for advice if you get a fever, chills, sore throat, or other symptoms of a cold or flu. Do not treat yourself. Try to avoid being around people who are sick. Call your health care provider if you are around anyone with measles, chickenpox, or if you develop sores or blisters that do not heal properly. Avoid taking medicines that contain aspirin, acetaminophen, ibuprofen, naproxen, or ketoprofen unless instructed by your health care provider. These medicines may hide a fever. This medicine may cause serious skin reactions. They can happen weeks to months after starting the medicine. Contact your health care provider right away if you notice fevers or flu-like symptoms with a rash. The rash may be red or purple and then turn into blisters or peeling of the skin. Or, you might notice a red rash with swelling of the face, lips or lymph nodes in your neck or under your arms. In some patients, this medicine may cause a serious brain infection that may cause death. If you have any problems seeing, thinking, speaking, walking, or standing, tell your healthcare professional right away.   If you cannot reach your healthcare professional, urgently seek other source of medical care. Do not become pregnant while taking this medicine or for at least 12 months after stopping it. Women should inform their health care provider if they wish to become pregnant or think they might be pregnant. There is potential for serious harm to an unborn child. Talk to your health care provider for more information. Women should use a reliable form of birth control while taking this medicine and for 12 months after stopping it. Do not  breast-feed while taking this medicine or for at least 6 months after stopping it. What side effects may I notice from receiving this medication? Side effects that you should report to your health care provider as soon as possible: allergic reactions (skin rash, itching or hives; swelling of the face, lips, or tongue) diarrhea edema (sudden weight gain; swelling of the ankles, feet, hands or other unusual swelling; trouble breathing) fast, irregular heartbeat heart attack (trouble breathing; pain or tightness in the chest, neck, back or arms; unusually weak or tired) infection (fever, chills, cough, sore throat, pain or trouble passing urine) kidney injury (trouble passing urine or change in the amount of urine) liver injury (dark yellow or brown urine; general ill feeling or flu-like symptoms; loss of appetite, right upper belly pain; unusually weak or tired, yellowing of the eyes or skin) low blood pressure (dizziness; feeling faint or lightheaded, falls; unusually weak or tired) low red blood cell counts (trouble breathing; feeling faint; lightheaded, falls; unusually weak or tired) mouth sores redness, blistering, peeling, or loosening of the skin, including inside the mouth stomach pain unusual bruising or bleeding wheezing (trouble breathing with loud or whistling sounds) vomiting Side effects that usually do not require medical attention (report to your health care provider if they continue or are bothersome): headache joint pain muscle cramps, pain nausea This list may not describe all possible side effects. Call your doctor for medical advice about side effects. You may report side effects to FDA at 1-800-FDA-1088. Where should I keep my medication? This medicine is given in a hospital or clinic. It will not be stored at home. NOTE: This sheet is a summary. It may not cover all possible information. If you have questions about this medicine, talk to your doctor, pharmacist, or  health care provider.  2022 Elsevier/Gold Standard (2020-08-28 15:47:26)  

## 2021-03-05 NOTE — Progress Notes (Signed)
Hematology and Oncology Follow Up Visit  [name] [id number] 18-Jan-1946 75 y.o. 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 daily IVIG 1 g/kg x 2 days-given in the hospital Xarelto 20 mg p.o. daily-started on 01/08/2021 Folic acid 2 mg p.o. daily Rituxan 375 mg/m2 q week -- s/p week 2/4     Interim History:  [name] is back for her follow-up.  Unfortunately, she had a relapse of the autoimmune hemolytic anemia.  This happened about 2 weeks ago.  I was just very surprised by this.  We got her back on a high dose of prednisone along with IVIG.  I also added Rituxan.  She has had 2/4 weeks of Rituxan.  We started Rituxan in the hospital.  She has had a very nice response.  She is not anemic any longer.  Her reticulocyte count is down.  She does still feel tired.  I am not sure as to why she still feels tired.  I suppose that there might be a "lag" between her blood counts and when she starts feeling better.  I will be checking her TSH.  She does have hypothyroidism.  She is still planning on going on her vacation with her family up to [place].  This will be in about 10 days.  She will have an appoint with one of the hematologist at [facility] in July.  We did do a Doppler of her legs when she was in the hospital recently.  There was no evidence of DVT in the right leg.  I still would like to keep her on the full dose Xarelto for right now.  She is had no problems with it.  Currently, her performance status is ECOG 1.  Medications:  Current Outpatient Medications:    acetaminophen (TYLENOL) 500 MG tablet, Take 1,000 mg by mouth every 6 (six) hours as needed for headache., Disp: , Rfl:    Calcium Carbonate+Vitamin D 600-200 MG-UNIT TABS, Take 2 tablets by mouth 2 (two) times daily.,  Disp: , Rfl:    famciclovir (FAMVIR) 500 MG tablet, Take 0.5 tablets (250 mg total) by mouth daily., Disp: 30 tablet, Rfl: 2   fluconazole (DIFLUCAN) 100 MG tablet, Take 1 tablet (100 mg total) by mouth daily., Disp: 30 tablet, Rfl: 0   folic acid (FOLVITE) 1 MG tablet, Take 2 tablets (2 mg total) by mouth daily., Disp: , Rfl:    gabapentin (NEURONTIN) 300 MG capsule, TAKE 1 CAPSULE BY MOUTH IN  THE MORNING , 1 CAPSULE BY  MOUTH IN THE AFTERNOON ,  AND 2 CAPSULES BEFORE  BEDTIME (Patient taking differently: Take 300 mg by mouth 4 (four) times daily.), Disp: 360 capsule, Rfl: 0   HYDROcodone-acetaminophen (NORCO/VICODIN) 5-325 MG tablet, Take 1 tablet by mouth., Disp: , Rfl:    levothyroxine (SYNTHROID) 112 MCG tablet, Take 112 mcg by mouth daily before breakfast., Disp: , Rfl:    lidocaine-prilocaine (EMLA) cream, Apply to affected area once, Disp: 30 g, Rfl: 3   LORazepam (ATIVAN) 2 MG tablet, Take 2 mg by mouth 3 (three) times daily as needed for anxiety., Disp: , Rfl:    Multiple Vitamins-Minerals (MULTIVITAMIN WITH MINERALS) tablet, Take 1 tablet by mouth daily., Disp: , Rfl:    pantoprazole (PROTONIX) 40 MG tablet, Take 1 tablet (40 mg total) by mouth 2 (two) times daily. (  Patient taking differently: Take 40 mg by mouth 2 (two) times daily as needed (indigestion).), Disp: 60 tablet, Rfl: 0   polycarbophil (FIBERCON) 625 MG tablet, Take 1,250 mg by mouth daily., Disp: , Rfl:    polyethylene glycol (MIRALAX / GLYCOLAX) 17 g packet, Take 17 g by mouth daily as needed for mild constipation., Disp: 14 each, Rfl: 0   polyvinyl alcohol (LIQUIFILM TEARS) 1.4 % ophthalmic solution, Place 1 drop into both eyes as needed for dry eyes., Disp: , Rfl:    predniSONE (DELTASONE) 20 MG tablet, Take 3 tablets (60 mg total) by mouth daily with breakfast., Disp: 90 tablet, Rfl: 0   rivaroxaban (XARELTO) 20 MG TABS tablet, Take 1 tablet (20 mg total) by mouth daily with supper., Disp: 90 tablet, Rfl: 2   sertraline  (ZOLOFT) 50 MG tablet, Take 50 mg by mouth daily., Disp: , Rfl:    sucralfate (CARAFATE) 1 g tablet, Take 1 g by mouth 2 (two) times daily., Disp: , Rfl:  No current facility-administered medications for this visit.  Facility-Administered Medications Ordered in Other Visits:    0.9 %  sodium chloride infusion, , Intravenous, Once, Adriannah Steinkamp, Rose Phi, MD   acetaminophen (TYLENOL) tablet 650 mg, 650 mg, Oral, Once, Nalee Lightle, Rose Phi, MD   diphenhydrAMINE (BENADRYL) capsule 50 mg, 50 mg, Oral, Once, Linnea Todisco, Rose Phi, MD   heparin lock flush 100 unit/mL, 500 Units, Intracatheter, Once PRN, Pernella Ackerley, Rose Phi, MD   riTUXimab-pvvr (RUXIENCE) 700 mg in sodium chloride 0.9 % 180 mL infusion, 375 mg/m2 (Treatment Plan Recorded), Intravenous, Once, Achol Azpeitia, Rose Phi, MD  Allergies:  Allergies  Allergen Reactions   Aspirin     Avoid due to stomach issues   Cephalexin     Stomach cramps   Imipenem     Warm and tingly all over   Losartan Potassium-Hctz     Other reaction(s): do not remember   Oxycodone     Hallucinations    Statins     leg pain, muscle cramps   Clindamycin/Lincomycin Nausea And Vomiting and Rash   Contrast Media [Iodinated Diagnostic Agents] Rash   Penicillins Rash    Did it involve swelling of the face/tongue/throat, SOB, or low BP? No Did it involve sudden or severe rash/hives, skin peeling, or any reaction on the inside of your mouth or nose? Yes Did you need to seek medical attention at a hospital or doctor's office? No When did it last happen?      2011 If all above answers are "NO", may proceed with cephalosporin use.    Quinolones Rash    Past Medical History, Surgical history, Social history, and Family History were reviewed and updated.  Review of Systems: Review of Systems  Constitutional:  Positive for fatigue.  HENT:  Negative.    Eyes: Negative.   Respiratory: Negative.    Gastrointestinal: Negative.   Endocrine: Negative.   Genitourinary: Negative.     Musculoskeletal: Negative.   Neurological:  Positive for numbness.  Hematological: Negative.   Psychiatric/Behavioral: Negative.     Physical Exam:  weight is 159 lb (72.1 kg). Her oral temperature is 99 F (37.2 C). Her blood pressure is 120/69 and her pulse is 64. Her respiration is 18 and oxygen saturation is 98%.   Wt Readings from Last 3 Encounters:  03/05/21 159 lb (72.1 kg)  02/19/21 155 lb (70.3 kg)  01/29/21 154 lb (69.9 kg)    Physical Exam Vitals reviewed.  HENT:     Head:  Normocephalic and atraumatic.  Eyes:     Pupils: Pupils are equal, round, and reactive to light.  Cardiovascular:     Rate and Rhythm: Normal rate and regular rhythm.     Heart sounds: Normal heart sounds.  Pulmonary:     Effort: Pulmonary effort is normal.     Breath sounds: Normal breath sounds.  Abdominal:     General: Bowel sounds are normal.     Palpations: Abdomen is soft.  Musculoskeletal:        General: No tenderness or deformity. Normal range of motion.     Cervical back: Normal range of motion.     Comments: Extremities shows no clubbing, cyanosis or edema.  She has good range of motion of her joints.  There may be some slight decrease sensation in the pretibial region of the right lower leg.  She has no palpable venous cords.  She has negative Homans sign.  Lymphadenopathy:     Cervical: No cervical adenopathy.  Skin:    General: Skin is warm and dry.     Findings: No erythema or rash.  Neurological:     Mental Status: She is alert and oriented to person, place, and time.  Psychiatric:        Behavior: Behavior normal.        Thought Content: Thought content normal.        Judgment: Judgment normal.     Lab Results  Component Value Date   WBC 15.7 (H) 03/05/2021   HGB 13.0 03/05/2021   HCT 36.5 03/05/2021   MCV 99.7 03/05/2021   PLT 433 (H) 03/05/2021     Chemistry      Component Value Date/Time   NA 133 (L) 03/05/2021 1010   NA 139 08/21/2020 1519   K 4.3  03/05/2021 1010   CL 99 03/05/2021 1010   CO2 26 03/05/2021 1010   BUN 16 03/05/2021 1010   BUN 8 08/21/2020 1519   CREATININE 0.73 03/05/2021 1010      Component Value Date/Time   CALCIUM 10.5 (H) 03/05/2021 1010   ALKPHOS 40 03/05/2021 1010   AST 18 03/05/2021 1010   ALT 18 03/05/2021 1010   BILITOT 0.7 03/05/2021 1010       Impression and Plan: Ms. Imhoff is a very charming 75 year old white female.  She has relapsed autoimmune hemolytic anemia.  Again this is idiopathic from all of our studies that we did when she first presented.  I just think that we have to decrease the frequency of her prednisone dosing.  I will have her alternate 60 mg a day with 40 mg a day right now.  I want her on this until I see her back.  Clearly, the Rituxan is helping.  She will get her third week today.  The reticulocyte count will clearly tell us how things are going.  I just feel bad that she feels tired.  We may have to check her iron studies.  The iron studies were fantastic when we saw her recently in the hospital.  I do not see any problems with her going on vacation to [place].  I cannot imagine that she is got have an issue with her blood counts.  I will plan to see her back myself in another 3 weeks.    Tabitha Macho, MD 6/16/202211:45 AM

## 2021-03-06 ENCOUNTER — Other Ambulatory Visit (HOSPITAL_BASED_OUTPATIENT_CLINIC_OR_DEPARTMENT_OTHER): Payer: Self-pay

## 2021-03-06 LAB — PROTEIN S PANEL
Protein S Activity: 112 % (ref 63–140)
Protein S Ag, Free: 121 % (ref 61–136)
Protein S Ag, Total: 138 % (ref 60–150)

## 2021-03-06 LAB — IGG, IGA, IGM
IgA: 171 mg/dL (ref 64–422)
IgG (Immunoglobin G), Serum: 2122 mg/dL — ABNORMAL HIGH (ref 586–1602)
IgM (Immunoglobulin M), Srm: 166 mg/dL (ref 26–217)

## 2021-03-06 LAB — TSH: TSH: 0.902 u[IU]/mL (ref 0.350–4.500)

## 2021-03-12 ENCOUNTER — Inpatient Hospital Stay: Payer: Medicare Other

## 2021-03-12 ENCOUNTER — Other Ambulatory Visit: Payer: Self-pay

## 2021-03-12 VITALS — BP 112/59 | HR 71 | Temp 97.9°F | Resp 18

## 2021-03-12 DIAGNOSIS — Z5112 Encounter for antineoplastic immunotherapy: Secondary | ICD-10-CM | POA: Diagnosis not present

## 2021-03-12 DIAGNOSIS — D589 Hereditary hemolytic anemia, unspecified: Secondary | ICD-10-CM

## 2021-03-12 LAB — CBC WITH DIFFERENTIAL/PLATELET
Abs Immature Granulocytes: 0.16 10*3/uL — ABNORMAL HIGH (ref 0.00–0.07)
Basophils Absolute: 0 10*3/uL (ref 0.0–0.1)
Basophils Relative: 0 %
Eosinophils Absolute: 0 10*3/uL (ref 0.0–0.5)
Eosinophils Relative: 0 %
HCT: 38.2 % (ref 36.0–46.0)
Hemoglobin: 12.8 g/dL (ref 12.0–15.0)
Immature Granulocytes: 2 %
Lymphocytes Relative: 4 %
Lymphs Abs: 0.4 10*3/uL — ABNORMAL LOW (ref 0.7–4.0)
MCH: 34.5 pg — ABNORMAL HIGH (ref 26.0–34.0)
MCHC: 33.5 g/dL (ref 30.0–36.0)
MCV: 103 fL — ABNORMAL HIGH (ref 80.0–100.0)
Monocytes Absolute: 0.1 10*3/uL (ref 0.1–1.0)
Monocytes Relative: 1 %
Neutro Abs: 8.7 10*3/uL — ABNORMAL HIGH (ref 1.7–7.7)
Neutrophils Relative %: 93 %
Platelets: 237 10*3/uL (ref 150–400)
RBC: 3.71 MIL/uL — ABNORMAL LOW (ref 3.87–5.11)
RDW: 14.9 % (ref 11.5–15.5)
WBC: 9.3 10*3/uL (ref 4.0–10.5)
nRBC: 0 % (ref 0.0–0.2)

## 2021-03-12 LAB — COMPREHENSIVE METABOLIC PANEL
ALT: 23 U/L (ref 0–44)
AST: 21 U/L (ref 15–41)
Albumin: 3.6 g/dL (ref 3.5–5.0)
Alkaline Phosphatase: 37 U/L — ABNORMAL LOW (ref 38–126)
Anion gap: 7 (ref 5–15)
BUN: 13 mg/dL (ref 8–23)
CO2: 27 mmol/L (ref 22–32)
Calcium: 9.4 mg/dL (ref 8.9–10.3)
Chloride: 100 mmol/L (ref 98–111)
Creatinine, Ser: 0.76 mg/dL (ref 0.44–1.00)
GFR, Estimated: >60 mL/min (ref >60)
Glucose, Bld: 171 mg/dL — ABNORMAL HIGH (ref 70–99)
Potassium: 4.6 mmol/L (ref 3.5–5.1)
Sodium: 134 mmol/L — ABNORMAL LOW (ref 135–145)
Total Bilirubin: 0.3 mg/dL (ref 0.3–1.2)
Total Protein: 6.5 g/dL (ref 6.5–8.1)

## 2021-03-12 MED ORDER — ACETAMINOPHEN 325 MG PO TABS
650.0000 mg | ORAL_TABLET | Freq: Once | ORAL | Status: AC
Start: 2021-03-12 — End: 2021-03-12
  Administered 2021-03-12: 650 mg via ORAL

## 2021-03-12 MED ORDER — DIPHENHYDRAMINE HCL 25 MG PO CAPS
ORAL_CAPSULE | ORAL | Status: AC
  Filled 2021-03-12: qty 2

## 2021-03-12 MED ORDER — SODIUM CHLORIDE 0.9 % IV SOLN
375.0000 mg/m2 | Freq: Once | INTRAVENOUS | Status: AC
  Administered 2021-03-12: 700 mg via INTRAVENOUS
  Filled 2021-03-12: qty 20

## 2021-03-12 MED ORDER — ACETAMINOPHEN 325 MG PO TABS
ORAL_TABLET | ORAL | Status: AC
  Filled 2021-03-12: qty 2

## 2021-03-12 MED ORDER — SODIUM CHLORIDE 0.9 % IV SOLN
Freq: Once | INTRAVENOUS | Status: AC
Start: 2021-03-12 — End: 2021-03-12
  Filled 2021-03-12: qty 250

## 2021-03-12 MED ORDER — DIPHENHYDRAMINE HCL 25 MG PO CAPS
50.0000 mg | ORAL_CAPSULE | Freq: Once | ORAL | Status: AC
Start: 2021-03-12 — End: 2021-03-12
  Administered 2021-03-12: 50 mg via ORAL

## 2021-03-12 NOTE — Patient Instructions (Signed)
[facility]  Discharge Instructions: ?Thank you for choosing [facility] to provide your oncology and hematology care.  ? ?If you have a lab appointment with the Cancer Center, please go directly to the Cancer Center and check in at the registration area. ? ?Wear comfortable clothing and clothing appropriate for easy access to any Portacath or PICC line.  ? ?We strive to give you quality time with your provider. You may need to reschedule your appointment if you arrive late (15 or more minutes).  Arriving late affects you and other patients whose appointments are after yours.  Also, if you miss three or more appointments without notifying the office, you may be dismissed from the clinic at the provider?s discretion.    ?  ?For prescription refill requests, have your pharmacy contact our office and allow 72 hours for refills to be completed.   ? ?Today you received the following chemotherapy and/or immunotherapy agents Rituxan  ?  ?To help prevent nausea and vomiting after your treatment, we encourage you to take your nausea medication as directed. ? ?BELOW ARE SYMPTOMS THAT SHOULD BE REPORTED IMMEDIATELY: ?*FEVER GREATER THAN 100.4 F (38 ?C) OR HIGHER ?*CHILLS OR SWEATING ?*NAUSEA AND VOMITING THAT IS NOT CONTROLLED WITH YOUR NAUSEA MEDICATION ?*UNUSUAL SHORTNESS OF BREATH ?*UNUSUAL BRUISING OR BLEEDING ?*URINARY PROBLEMS (pain or burning when urinating, or frequent urination) ?*BOWEL PROBLEMS (unusual diarrhea, constipation, pain near the anus) ?TENDERNESS IN MOUTH AND THROAT WITH OR WITHOUT PRESENCE OF ULCERS (sore throat, sores in mouth, or a toothache) ?UNUSUAL RASH, SWELLING OR PAIN  ?UNUSUAL VAGINAL DISCHARGE OR ITCHING  ? ?Items with * indicate a potential emergency and should be followed up as soon as possible or go to the Emergency Department if any problems should occur. ? ?Please show the CHEMOTHERAPY ALERT CARD or IMMUNOTHERAPY ALERT CARD at check-in to the  Emergency Department and triage nurse. ?Should you have questions after your visit or need to cancel or reschedule your appointment, please contact [facility]  [phone] and follow the prompts.  Office hours are 8:00 a.m. to 4:30 p.m. Monday - Friday. Please note that voicemails left after 4:00 p.m. may not be returned until the following business day.  We are closed weekends and major holidays. You have access to a nurse at all times for urgent questions. Please call the main number to the clinic [phone] and follow the prompts. ? ?For any non-urgent questions, you may also contact your provider using MyChart. We now offer e-Visits for anyone 18 and older to request care online for non-urgent symptoms. For details visit [email or web address]. ?  ?Also download the MyChart app! Go to the app store, search "MyChart", open the app, select [facility], and log in with your MyChart username and password. ? ?Due to Covid, a mask is required upon entering the hospital/clinic. If you do not have a mask, one will be given to you upon arrival. For doctor visits, patients may have 1 support person aged 18 or older with them. For treatment visits, patients cannot have anyone with them due to current Covid guidelines and our immunocompromised population.  ?

## 2021-03-17 ENCOUNTER — Other Ambulatory Visit: Payer: Medicare Other

## 2021-03-17 ENCOUNTER — Ambulatory Visit: Payer: Medicare Other | Admitting: Hematology & Oncology

## 2021-03-20 ENCOUNTER — Encounter: Payer: Self-pay | Admitting: Hematology & Oncology

## 2021-03-25 ENCOUNTER — Telehealth: Payer: Self-pay

## 2021-03-25 ENCOUNTER — Inpatient Hospital Stay: Payer: Medicare Other | Attending: Hematology & Oncology

## 2021-03-25 ENCOUNTER — Ambulatory Visit (HOSPITAL_BASED_OUTPATIENT_CLINIC_OR_DEPARTMENT_OTHER)
Admission: RE | Admit: 2021-03-25 | Discharge: 2021-03-25 | Disposition: A | Discharge status: Home / Self Care | Payer: Medicare Other | Source: Ambulatory Visit | Attending: Hematology & Oncology | Admitting: Hematology & Oncology

## 2021-03-25 ENCOUNTER — Inpatient Hospital Stay: Payer: Medicare Other

## 2021-03-25 ENCOUNTER — Other Ambulatory Visit: Payer: Self-pay

## 2021-03-25 ENCOUNTER — Encounter: Payer: Self-pay | Admitting: Hematology & Oncology

## 2021-03-25 ENCOUNTER — Inpatient Hospital Stay (HOSPITAL_BASED_OUTPATIENT_CLINIC_OR_DEPARTMENT_OTHER): Payer: Medicare Other | Admitting: Hematology & Oncology

## 2021-03-25 VITALS — BP 149/77 | HR 64 | Temp 99.0°F | Resp 20 | Wt 163.0 lb

## 2021-03-25 VITALS — BP 132/60 | HR 67 | Resp 20

## 2021-03-25 DIAGNOSIS — Z7952 Long term (current) use of systemic steroids: Secondary | ICD-10-CM | POA: Insufficient documentation

## 2021-03-25 DIAGNOSIS — D5911 Warm autoimmune hemolytic anemia: Secondary | ICD-10-CM | POA: Insufficient documentation

## 2021-03-25 DIAGNOSIS — I82401 Acute embolism and thrombosis of unspecified deep veins of right lower extremity: Secondary | ICD-10-CM | POA: Diagnosis present

## 2021-03-25 DIAGNOSIS — D591 Autoimmune hemolytic anemia, unspecified: Secondary | ICD-10-CM

## 2021-03-25 DIAGNOSIS — Z5112 Encounter for antineoplastic immunotherapy: Secondary | ICD-10-CM | POA: Diagnosis present

## 2021-03-25 DIAGNOSIS — Z8673 Personal history of transient ischemic attack (TIA), and cerebral infarction without residual deficits: Secondary | ICD-10-CM | POA: Insufficient documentation

## 2021-03-25 DIAGNOSIS — Z862 Personal history of diseases of the blood and blood-forming organs and certain disorders involving the immune mechanism: Secondary | ICD-10-CM | POA: Diagnosis not present

## 2021-03-25 DIAGNOSIS — I82451 Acute embolism and thrombosis of right peroneal vein: Secondary | ICD-10-CM | POA: Insufficient documentation

## 2021-03-25 DIAGNOSIS — Z79899 Other long term (current) drug therapy: Secondary | ICD-10-CM | POA: Diagnosis not present

## 2021-03-25 DIAGNOSIS — Z7901 Long term (current) use of anticoagulants: Secondary | ICD-10-CM | POA: Diagnosis not present

## 2021-03-25 DIAGNOSIS — R252 Cramp and spasm: Secondary | ICD-10-CM | POA: Insufficient documentation

## 2021-03-25 DIAGNOSIS — R5383 Other fatigue: Secondary | ICD-10-CM | POA: Insufficient documentation

## 2021-03-25 DIAGNOSIS — G609 Hereditary and idiopathic neuropathy, unspecified: Secondary | ICD-10-CM

## 2021-03-25 DIAGNOSIS — D589 Hereditary hemolytic anemia, unspecified: Secondary | ICD-10-CM

## 2021-03-25 DIAGNOSIS — R200 Anesthesia of skin: Secondary | ICD-10-CM | POA: Diagnosis not present

## 2021-03-25 LAB — RETICULOCYTES
Immature Retic Fract: 9.9 % (ref 2.3–15.9)
RBC.: 3.98 MIL/uL (ref 3.87–5.11)
Retic Count, Absolute: 97.1 10*3/uL (ref 19.0–186.0)
Retic Ct Pct: 2.4 % (ref 0.4–3.1)

## 2021-03-25 LAB — CBC WITH DIFFERENTIAL (CANCER CENTER ONLY)
Abs Immature Granulocytes: 0.23 10*3/uL — ABNORMAL HIGH (ref 0.00–0.07)
Basophils Absolute: 0 10*3/uL (ref 0.0–0.1)
Basophils Relative: 0 %
Eosinophils Absolute: 0.1 10*3/uL (ref 0.0–0.5)
Eosinophils Relative: 1 %
HCT: 40.9 % (ref 36.0–46.0)
Hemoglobin: 13.5 g/dL (ref 12.0–15.0)
Immature Granulocytes: 3 %
Lymphocytes Relative: 15 %
Lymphs Abs: 1.4 10*3/uL (ref 0.7–4.0)
MCH: 33.5 pg (ref 26.0–34.0)
MCHC: 33 g/dL (ref 30.0–36.0)
MCV: 101.5 fL — ABNORMAL HIGH (ref 80.0–100.0)
Monocytes Absolute: 0.6 10*3/uL (ref 0.1–1.0)
Monocytes Relative: 7 %
Neutro Abs: 6.7 10*3/uL (ref 1.7–7.7)
Neutrophils Relative %: 74 %
Platelet Count: 256 10*3/uL (ref 150–400)
RBC: 4.03 MIL/uL (ref 3.87–5.11)
RDW: 13.7 % (ref 11.5–15.5)
WBC Count: 9.1 10*3/uL (ref 4.0–10.5)
nRBC: 0 % (ref 0.0–0.2)

## 2021-03-25 LAB — CMP (CANCER CENTER ONLY)
ALT: 22 U/L (ref 0–44)
AST: 19 U/L (ref 15–41)
Albumin: 3.8 g/dL (ref 3.5–5.0)
Alkaline Phosphatase: 29 U/L — ABNORMAL LOW (ref 38–126)
Anion gap: 9 (ref 5–15)
BUN: 14 mg/dL (ref 8–23)
CO2: 27 mmol/L (ref 22–32)
Calcium: 9.7 mg/dL (ref 8.9–10.3)
Chloride: 102 mmol/L (ref 98–111)
Creatinine: 0.71 mg/dL (ref 0.44–1.00)
GFR, Estimated: >60 mL/min (ref >60)
Glucose, Bld: 115 mg/dL — ABNORMAL HIGH (ref 70–99)
Potassium: 4.1 mmol/L (ref 3.5–5.1)
Sodium: 138 mmol/L (ref 135–145)
Total Bilirubin: 0.5 mg/dL (ref 0.3–1.2)
Total Protein: 6.3 g/dL — ABNORMAL LOW (ref 6.5–8.1)

## 2021-03-25 LAB — IRON AND TIBC
Iron: 132 ug/dL (ref 41–142)
Saturation Ratios: 40 % (ref 21–57)
TIBC: 329 ug/dL (ref 236–444)
UIBC: 197 ug/dL (ref 120–384)

## 2021-03-25 LAB — FERRITIN: Ferritin: 446 ng/mL — ABNORMAL HIGH (ref 11–307)

## 2021-03-25 MED ORDER — DIPHENHYDRAMINE HCL 25 MG PO CAPS
ORAL_CAPSULE | ORAL | Status: AC
  Filled 2021-03-25: qty 2

## 2021-03-25 MED ORDER — ACETAMINOPHEN 325 MG PO TABS
ORAL_TABLET | ORAL | Status: AC
  Filled 2021-03-25: qty 1

## 2021-03-25 MED ORDER — ACETAMINOPHEN 325 MG PO TABS
650.0000 mg | ORAL_TABLET | Freq: Once | ORAL | Status: AC
Start: 2021-03-25 — End: 2021-03-25
  Administered 2021-03-25: 650 mg via ORAL

## 2021-03-25 MED ORDER — SODIUM CHLORIDE 0.9 % IV SOLN
Freq: Once | INTRAVENOUS | Status: AC
  Filled 2021-03-25: qty 250

## 2021-03-25 MED ORDER — SODIUM CHLORIDE 0.9 % IV SOLN
375.0000 mg/m2 | Freq: Once | INTRAVENOUS | Status: AC
  Administered 2021-03-25: 700 mg via INTRAVENOUS
  Filled 2021-03-25: qty 50

## 2021-03-25 MED ORDER — ACETAMINOPHEN 325 MG PO TABS
ORAL_TABLET | ORAL | Status: AC
  Filled 2021-03-25: qty 2

## 2021-03-25 MED ORDER — DIPHENHYDRAMINE HCL 25 MG PO CAPS
50.0000 mg | ORAL_CAPSULE | Freq: Once | ORAL | Status: AC
Start: 2021-03-25 — End: 2021-03-25
  Administered 2021-03-25: 50 mg via ORAL

## 2021-03-25 NOTE — Telephone Encounter (Signed)
Informed patient of lab results in person, patient verbalized understanding and denies any questions or concerns at this time. Pt states she will cut her 20 mg dose in half until she is completely out.

## 2021-03-25 NOTE — Progress Notes (Signed)
Hematology and Oncology Follow Up Visit  [name] [id number] 1946-05-16 75 y.o. 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 daily IVIG 1 g/kg x 2 days-given in the hospital Xarelto 20 mg p.o. daily-started on 01/08/2021 Folic acid 2 mg p.o. daily Rituxan 375 mg/m2 q week -- s/p week 2/4     Interim History:  [name] is back for her follow-up.  Unfortunately, she had a relapse of the autoimmune hemolytic anemia.  She responded quite well to the Rituxan.  We gave her IVIG.  She had prednisone.  We are decreasing her prednisone dose.  She and her husband a wonderful time up in the mountains in [place].  They really enjoyed themselves.  They really did a lot while they are up there.  Her big problem right now is cramps in her hands.  I am not sure why she would have this.  I suppose the prednisone could do this.  We are going to decrease her prednisone dose down to 20 mg a day now.  I told her to try some over-the-counter magnesium oxide.  I think this comes as 400 mg tablets.  I told her to take 1 twice a day to see if this may help.  She has had no issues with fever.  There is no cough or shortness of breath.  She has had no bleeding.  Think she goes for a Doppler today of her right leg.  If the Doppler shows that the blood clot is resolving, we will decrease her Xarelto to 10 mg p.o. daily.  Overall, I would have to say her performance status is probably ECOG 1.    Medications:  Current Outpatient Medications:    acetaminophen (TYLENOL) 500 MG tablet, Take 1,000 mg by mouth every 6 (six) hours as needed for headache., Disp: , Rfl:    Calcium Carbonate+Vitamin D 600-200 MG-UNIT TABS, Take 2 tablets by mouth 2 (two) times daily., Disp: , Rfl:    famciclovir (FAMVIR) 500 MG tablet, Take 1/2  tablet (250 mg total) by mouth daily., Disp: 30 tablet, Rfl: 2   fluconazole (DIFLUCAN) 100 MG tablet, Take 1 tablet (100 mg total) by mouth daily., Disp: 30 tablet, Rfl: 0   folic acid (FOLVITE) 1 MG tablet, Take 2 tablets (2 mg total) by mouth daily., Disp: , Rfl:    gabapentin (NEURONTIN) 300 MG capsule, TAKE 1 CAPSULE BY MOUTH IN  THE MORNING , 1 CAPSULE BY  MOUTH IN THE AFTERNOON ,  AND 2 CAPSULES BEFORE  BEDTIME (Patient taking differently: Take 300 mg by mouth 4 (four) times daily.), Disp: 360 capsule, Rfl: 0   HYDROcodone-acetaminophen (NORCO/VICODIN) 5-325 MG tablet, Take 1 tablet by mouth., Disp: , Rfl:    levothyroxine (SYNTHROID) 112 MCG tablet, Take 112 mcg by mouth daily before breakfast., Disp: , Rfl:    lidocaine-prilocaine (EMLA) cream, Apply to affected area once, Disp: 30 g, Rfl: 3   LORazepam (ATIVAN) 2 MG tablet, Take 2 mg by mouth 3 (three) times daily as needed for anxiety., Disp: , Rfl:    Multiple Vitamins-Minerals (MULTIVITAMIN WITH MINERALS) tablet, Take 1 tablet by mouth daily., Disp: , Rfl:    pantoprazole (PROTONIX) 40 MG tablet, Take 1 tablet (40 mg total) by mouth 2 (two) times daily. (Patient taking differently: Take 40 mg by mouth 2 (two) times  daily as needed (indigestion).), Disp: 60 tablet, Rfl: 0   polycarbophil (FIBERCON) 625 MG tablet, Take 1,250 mg by mouth daily., Disp: , Rfl:    polyethylene glycol (MIRALAX / GLYCOLAX) 17 g packet, Take 17 g by mouth daily as needed for mild constipation., Disp: 14 each, Rfl: 0   polyvinyl alcohol (LIQUIFILM TEARS) 1.4 % ophthalmic solution, Place 1 drop into both eyes as needed for dry eyes., Disp: , Rfl:    predniSONE (DELTASONE) 20 MG tablet, Take 3 tablets (60 mg total) by mouth daily with breakfast., Disp: 90 tablet, Rfl: 0   rivaroxaban (XARELTO) 20 MG TABS tablet, Take 1 tablet (20 mg total) by mouth daily with supper., Disp: 90 tablet, Rfl: 2   sertraline (ZOLOFT) 50 MG tablet, Take 50 mg by mouth daily., Disp: , Rfl:     sucralfate (CARAFATE) 1 g tablet, Take 1 g by mouth 2 (two) times daily., Disp: , Rfl:   Allergies:  Allergies  Allergen Reactions   Aspirin     Avoid due to stomach issues   Cephalexin     Stomach cramps   Imipenem     Warm and tingly all over   Losartan Potassium-Hctz     Other reaction(s): do not remember   Oxycodone     Hallucinations    Statins     leg pain, muscle cramps   Clindamycin/Lincomycin Nausea And Vomiting and Rash   Contrast Media [Iodinated Diagnostic Agents] Rash   Penicillins Rash    Did it involve swelling of the face/tongue/throat, SOB, or low BP? No Did it involve sudden or severe rash/hives, skin peeling, or any reaction on the inside of your mouth or nose? Yes Did you need to seek medical attention at a hospital or doctor's office? No When did it last happen?      2011 If all above answers are "NO", may proceed with cephalosporin use.    Quinolones Rash    Past Medical History, Surgical history, Social history, and Family History were reviewed and updated.  Review of Systems: Review of Systems  Constitutional:  Positive for fatigue.  HENT:  Negative.    Eyes: Negative.   Respiratory: Negative.    Gastrointestinal: Negative.   Endocrine: Negative.   Genitourinary: Negative.    Musculoskeletal: Negative.   Neurological:  Positive for numbness.  Hematological: Negative.   Psychiatric/Behavioral: Negative.     Physical Exam:  vitals were not taken for this visit.   Wt Readings from Last 3 Encounters:  03/05/21 159 lb (72.1 kg)  02/19/21 155 lb (70.3 kg)  01/29/21 154 lb (69.9 kg)    Physical Exam Vitals reviewed.  HENT:     Head: Normocephalic and atraumatic.  Eyes:     Pupils: Pupils are equal, round, and reactive to light.  Cardiovascular:     Rate and Rhythm: Normal rate and regular rhythm.     Heart sounds: Normal heart sounds.  Pulmonary:     Effort: Pulmonary effort is normal.     Breath sounds: Normal breath sounds.   Abdominal:     General: Bowel sounds are normal.     Palpations: Abdomen is soft.  Musculoskeletal:        General: No tenderness or deformity. Normal range of motion.     Cervical back: Normal range of motion.     Comments: Extremities shows no clubbing, cyanosis or edema.  She has good range of motion of her joints.  There may be some slight decrease sensation  in the pretibial region of the right lower leg.  She has no palpable venous cords.  She has negative Homans sign.  Lymphadenopathy:     Cervical: No cervical adenopathy.  Skin:    General: Skin is warm and dry.     Findings: No erythema or rash.  Neurological:     Mental Status: She is alert and oriented to person, place, and time.  Psychiatric:        Behavior: Behavior normal.        Thought Content: Thought content normal.        Judgment: Judgment normal.     Lab Results  Component Value Date   WBC 9.1 03/25/2021   HGB 13.5 03/25/2021   HCT 40.9 03/25/2021   MCV 101.5 (H) 03/25/2021   PLT 256 03/25/2021     Chemistry      Component Value Date/Time   NA 138 03/25/2021 0826   NA 139 08/21/2020 1519   K 4.1 03/25/2021 0826   CL 102 03/25/2021 0826   CO2 27 03/25/2021 0826   BUN 14 03/25/2021 0826   BUN 8 08/21/2020 1519   CREATININE 0.71 03/25/2021 0826      Component Value Date/Time   CALCIUM 9.7 03/25/2021 0826   ALKPHOS 29 (L) 03/25/2021 0826   AST 19 03/25/2021 0826   ALT 22 03/25/2021 0826   BILITOT 0.5 03/25/2021 0826       Impression and Plan: Ms. Heady is a very charming 75 year old white female.  She has relapsed autoimmune hemolytic anemia.  Again this is idiopathic from all of our studies that we did when she first presented.  She will finish up her Rituxan today.  Decrease her prednisone down to 20 mg a day.  Hopefully, this will help with the cramps.  We will have to still go slowly with the prednisone.  We will see what the Doppler study shows of her right leg.  If the blood clot is  improved, then we will decrease her Xarelto to 10 mg daily.  I would like to see her back in about 5 or 6 weeks.  Hopefully, she will maintain a remission from her autoimmune hemolytic anemia.     Josph Macho, MD 7/6/20229:38 AM

## 2021-03-25 NOTE — Telephone Encounter (Signed)
-----   Message from Josph Macho, MD sent at 03/25/2021 11:47 AM EDT ----- Please call and tell her that there is no blood clot in the right leg now.  Please have her decrease the Xarelto to 10 mg a day.  You may have to send in a new prescription for me.  Thanks.  Cindee Lame

## 2021-03-25 NOTE — Patient Instructions (Signed)
[facility]  Discharge Instructions: Thank you for choosing [facility] to provide your oncology and hematology care.   If you have a lab appointment with the Cancer Center, please go directly to the Cancer Center and check in at the registration area.  Wear comfortable clothing and clothing appropriate for easy access to any Portacath or PICC line.   We strive to give you quality time with your provider. You may need to reschedule your appointment if you arrive late (15 or more minutes).  Arriving late affects you and other patients whose appointments are after yours.  Also, if you miss three or more appointments without notifying the office, you may be dismissed from the clinic at the provider's discretion.      For prescription refill requests, have your pharmacy contact our office and allow 72 hours for refills to be completed.    Today you received the following chemotherapy and/or immunotherapy agents rituxan   To help prevent nausea and vomiting after your treatment, we encourage you to take your nausea medication as directed.  BELOW ARE SYMPTOMS THAT SHOULD BE REPORTED IMMEDIATELY: *FEVER GREATER THAN 100.4 F (38 C) OR HIGHER *CHILLS OR SWEATING *NAUSEA AND VOMITING THAT IS NOT CONTROLLED WITH YOUR NAUSEA MEDICATION *UNUSUAL SHORTNESS OF BREATH *UNUSUAL BRUISING OR BLEEDING *URINARY PROBLEMS (pain or burning when urinating, or frequent urination) *BOWEL PROBLEMS (unusual diarrhea, constipation, pain near the anus) TENDERNESS IN MOUTH AND THROAT WITH OR WITHOUT PRESENCE OF ULCERS (sore throat, sores in mouth, or a toothache) UNUSUAL RASH, SWELLING OR PAIN  UNUSUAL VAGINAL DISCHARGE OR ITCHING   Items with * indicate a potential emergency and should be followed up as soon as possible or go to the Emergency Department if any problems should occur.  Please show the CHEMOTHERAPY ALERT CARD or IMMUNOTHERAPY ALERT CARD at check-in to the  Emergency Department and triage nurse. Should you have questions after your visit or need to cancel or reschedule your appointment, please contact [facility]  [phone] and follow the prompts.  Office hours are 8:00 a.m. to 4:30 p.m. Monday - Friday. Please note that voicemails left after 4:00 p.m. may not be returned until the following business day.  We are closed weekends and major holidays. You have access to a nurse at all times for urgent questions. Please call the main number to the clinic [phone] and follow the prompts.  For any non-urgent questions, you may also contact your provider using MyChart. We now offer e-Visits for anyone 18 and older to request care online for non-urgent symptoms. For details visit [email or web address].   Also download the MyChart app! Go to the app store, search "MyChart", open the app, select [facility], and log in with your MyChart username and password.  Due to Covid, a mask is required upon entering the hospital/clinic. If you do not have a mask, one will be given to you upon arrival. For doctor visits, patients may have 1 support person aged 18 or older with them. For treatment visits, patients cannot have anyone with them due to current Covid guidelines and our immunocompromised population.  

## 2021-03-26 LAB — PROTEIN S, ANTIGEN, FREE: Protein S Ag, Free: 132 % (ref 61–136)

## 2021-03-26 LAB — PROTEIN S, TOTAL: Protein S Ag, Total: 114 % (ref 60–150)

## 2021-03-28 ENCOUNTER — Encounter: Payer: Self-pay | Admitting: Hematology & Oncology

## 2021-04-01 ENCOUNTER — Encounter: Payer: Self-pay | Admitting: Hematology & Oncology

## 2021-04-02 ENCOUNTER — Encounter: Payer: Self-pay | Admitting: Neurology

## 2021-04-02 ENCOUNTER — Ambulatory Visit: Payer: Medicare Other | Admitting: Neurology

## 2021-04-02 VITALS — BP 122/80 | HR 66 | Ht 65.0 in | Wt 164.4 lb

## 2021-04-02 DIAGNOSIS — I6310 Cerebral infarction due to embolism of unspecified precerebral artery: Secondary | ICD-10-CM | POA: Diagnosis not present

## 2021-04-05 NOTE — Progress Notes (Signed)
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  tPA at that time was not appropriate due to low hemoglobin and suspected hemolytic anemia.  Lower extremity venous Doppler showed a DVT and a 2D echo suggested a PE.  Due to location of stroke, patient also had quadrantanopia which has significantly improved, in fact  patient states she is feeling well.    MRI brain 01/05/2021: Acute infarcts of the right parietotemporal lobes greater than left frontal lobe. Additional punctate left parietal and right occipital acute infarcts.   Mild chronic microvascular ischemic changes. Few chronic microhemorrhages.. personally reviewed and agree with finsings  MRA: No intracranial large vessel occlusion or proximal high-grade arterial stenosis.  Echocardiogram: IMPRESSIONS     1. Left ventricular ejection fraction, by estimation, is 60 to 65%. The  left ventricle has normal function. The left ventricle has no regional  wall motion abnormalities. Left ventricular diastolic parameters are  consistent with Grade I diastolic  dysfunction (impaired relaxation).   2. McConnell's sign appears to be present.. Right ventricular systolic  function is mildly reduced. The right ventricular size is moderately  enlarged. There is moderately elevated pulmonary artery systolic pressure.   3. Right atrial size was  moderately dilated.   4. The mitral valve is normal in structure. Mild mitral valve  regurgitation. No evidence of mitral stenosis.   5. Tricuspid valve regurgitation is moderate.   6. The aortic valve is tricuspid. Aortic valve regurgitation is not  visualized. No aortic stenosis is present.   7. The inferior vena cava is dilated in size with <50% respiratory  variability, suggesting right atrial pressure of 15 mmHg.    Carotid Ultrasounds: Summary:  Right Carotid: The extracranial vessels were near-normal with only minimal  wall                 thickening or plaque.   Left Carotid: The extracranial vessels were near-normal with only minimal  wall                thickening or plaque.   Vertebrals:  Bilateral vertebral arteries demonstrate antegrade flow.  Subclavians: Normal flow hemodynamics were seen in bilateral subclavian       HPI neuropathy 08/21/2020:  [name] is a 75 y.o. female here as requested by Rosalin Hawking, MD for aching and cold feet, past medical history of distal first metatarsal osteotomy to repair hallux valgus left with screw fixation in the same procedure on the right both in 2019, migraines, hyperthyroidism, I reviewed Dr. Barkley Bruns notes which reported palpable dorsalis pedis and posterior tibialis pulses in both feet, normal temperature gradient from ankle to toes, normal color and turgor, digital capillary refill time normal, subjective paresthesias and tingling, normal sharp and dull sensation is noted, decreased vibratory sensation in both feet, Achilles tendon reflexes diminished, Tinel's sign is negative at the tarsal tunnel. No other information available since podiatry referred we  do not have their primary care note, would appreciate it if podiatry defers to primary care to refer to specialists as they deem clinically appropriate.  Started 3 years ago. With husband who provides information. About a year ago she saw her arthritis doctor and she was having  problems with her feet and she was told she has arthritis and she saw a foot doctor. It is in the top of the feet and comes up the legs. She wakes early mornings and she has cramps in the feet and sometimes all the legs hurt. She has back pain but no radicular symptoms, she has a lot of cramps, she tries to drink water. No pre-diabetes, no diabetes, no autoimmune disease, no chemotherapy, brother has parkinson's, sister has neuropathy without a diagnosis, she does not know if parents had neuropathy. Numbness and tingling and itching and it comes all the way up to the thighs, inparticular on the top of the right foot. At night both her legs hurt all the way to the thighs not in a particular dermatome. The legs ache but the feet are more tingling, numb, itchy, cold, Gabapentin helps.   Reviewed notes, labs and imaging from outside physicians, which showed: see above  Review of Systems: Patient complains of symptoms per HPI as well as the following symptoms: pain in feet. Pertinent negatives and positives per HPI. All others negative.   Social History   Socioeconomic History   Marital status: Married    Spouse name: Not on file   Number of children: Not on file   Years of education: Not on file   Highest education level: Not on file  Occupational History   Not on file  Tobacco Use   Smoking status: Former    Types: Cigarettes    Quit date: 1969    Years since quitting: 53.5   Smokeless tobacco: Never  Vaping Use   Vaping Use: Never used  Substance and Sexual Activity   Alcohol use: No   Drug use: No   Sexual activity: Not on file  Other Topics Concern   Not on file  Social History Narrative   Not on file   Social Determinants of Health   Financial Resource Strain: Not on file  Food Insecurity: Not on file  Transportation Needs: Not on file  Physical Activity: Not on file  Stress: Not on file  Social Connections: Not on file  Intimate Partner Violence: Not on file    Family  History  Problem Relation Age of Onset   Heart disease Mother    Stroke Father    Arthritis Sister    Diabetes Brother    Arthritis Brother    Neuropathy Brother    Neuropathy Sister    Diabetes Brother     Past Medical History:  Diagnosis Date   Anxiety    Dysphagia    HYPERTHYROIDISM    LYMPHADENOPATHY    MIGRAINE HEADACHE    Neuromuscular disorder ([place])    condition where muscle separates from bone left chest, quarter size, pain intermittent    Patient Active Problem List   Diagnosis Date Noted   Symptomatic anemia 02/19/2021   DVT (deep venous thrombosis) ([place]) 01/12/2021   Pulmonary embolism ([place]) 01/12/2021   Hypothyroidism 01/12/2021   Anxiety 01/12/2021   Cerebral thrombosis with cerebral infarction 01/06/2021   Cerebral embolism with cerebral infarction 01/06/2021   Hemolytic anemia ([place]) 01/03/2021   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   Polyneuropathy 08/22/2020   S/P shoulder  replacement, right 03/16/2019   PVC (premature ventricular contraction) 07/11/2013   Dysphagia    HYPERTHYROIDISM 07/17/2010   MIGRAINE HEADACHE 07/17/2010   LYMPHADENOPATHY 07/17/2010    Past Surgical History:  Procedure Laterality Date   BALLOON DILATION N/A 09/18/2020   Procedure: BALLOON DILATION;  Surgeon: Ronnette Juniper, MD;  Location: [facility];  Service: Gastroenterology;  Laterality: N/A;   ESOPHAGOGASTRODUODENOSCOPY (EGD) WITH PROPOFOL N/A 09/18/2020   Procedure: ESOPHAGOGASTRODUODENOSCOPY (EGD) WITH PROPOFOL;  Surgeon: Ronnette Juniper, MD;  Location: [facility];  Service: Gastroenterology;  Laterality: N/A;   EYE SURGERY Bilateral    Cataract L eye 1-20, R eye 3-20    FOREIGN BODY REMOVAL  09/18/2020   Procedure: FOREIGN BODY REMOVAL;  Surgeon: Ronnette Juniper, MD;  Location: [facility];  Service: Gastroenterology;;   [place] ARTHROPLASTY Right 03/16/2019   Procedure: REVERSE SHOULDER ARTHROPLASTY;  Surgeon: Netta Cedars,  MD;  Location: [facility];  Service: Orthopedics;  Laterality: Right;  interscalene block   TUBAL LIGATION      Current Outpatient Medications  Medication Sig Dispense Refill   acetaminophen (TYLENOL) 500 MG tablet Take 1,000 mg by mouth every 6 (six) hours as needed for headache.     Calcium Carbonate+Vitamin D 600-200 MG-UNIT TABS Take 2 tablets by mouth 2 (two) times daily.     famciclovir (FAMVIR) 500 MG tablet Take 1/2 tablet (250 mg total) by mouth daily. 30 tablet 2   fluconazole (DIFLUCAN) 100 MG tablet Take 1 tablet (100 mg total) by mouth daily. 30 tablet 0   folic acid (FOLVITE) 1 MG tablet Take 2 tablets (2 mg total) by mouth daily.     gabapentin (NEURONTIN) 300 MG capsule TAKE 1 CAPSULE BY MOUTH IN  THE MORNING , 1 CAPSULE BY  MOUTH IN THE AFTERNOON ,  AND 2 CAPSULES BEFORE  BEDTIME (Patient taking differently: Take 300 mg by mouth 4 (four) times daily.) 360 capsule 0   HYDROcodone-acetaminophen (NORCO/VICODIN) 5-325 MG tablet Take 1 tablet by mouth. As needed     levothyroxine (SYNTHROID) 112 MCG tablet Take 112 mcg by mouth daily before breakfast.     lidocaine-prilocaine (EMLA) cream Apply to affected area once (Patient taking differently: Apply to affected area once PRN) 30 g 3   LORazepam (ATIVAN) 2 MG tablet Take 2 mg by mouth 3 (three) times daily as needed for anxiety.     Multiple Vitamins-Minerals (MULTIVITAMIN WITH MINERALS) tablet Take 1 tablet by mouth daily.     pantoprazole (PROTONIX) 40 MG tablet Take 1 tablet (40 mg total) by mouth 2 (two) times daily. 60 tablet 0   polycarbophil (FIBERCON) 625 MG tablet Take 1,250 mg by mouth daily.     polyethylene glycol (MIRALAX / GLYCOLAX) 17 g packet Take 17 g by mouth daily as needed for mild constipation. 14 each 0   polyvinyl alcohol (LIQUIFILM TEARS) 1.4 % ophthalmic solution Place 1 drop into both eyes as needed for dry eyes.     predniSONE (DELTASONE) 20 MG tablet Take 3 tablets (60 mg total) by mouth daily with breakfast.  (Patient taking differently: Take 20 mg by mouth daily with breakfast.) 90 tablet 0   rivaroxaban (XARELTO) 20 MG TABS tablet Take 1 tablet (20 mg total) by mouth daily with supper. (Patient taking differently: Take 10 mg by mouth daily with supper.) 90 tablet 2   sertraline (ZOLOFT) 50 MG tablet Take 50 mg by mouth daily.     sucralfate (CARAFATE) 1 g tablet  Take 1 g by mouth 2 (two) times daily.     No current facility-administered medications for this visit.    Allergies as of 04/02/2021 - Review Complete 04/02/2021  Allergen Reaction Noted   Aspirin  03/06/2019   Cephalexin  03/06/2019   Imipenem  03/06/2019   Losartan potassium-hctz  11/06/2020   Oxycodone  03/06/2019   Statins     Clindamycin/lincomycin Nausea And Vomiting and Rash 03/06/2019   Contrast media [iodinated diagnostic agents] Rash 09/23/2020   Penicillins Rash    Quinolones Rash     Vitals: BP 122/80   Pulse 66   Ht 5' 5"  (1.651 m)   Wt 164 lb 6 oz (74.6 kg)   SpO2 96%   BMI 27.35 kg/m  Last Weight:  Wt Readings from Last 1 Encounters:  04/02/21 164 lb 6 oz (74.6 kg)   Last Height:   Ht Readings from Last 1 Encounters:  04/02/21 5' 5"  (1.651 m)    Physical exam: Exam: Gen: NAD, conversant, well nourised, well groomed                     CV: RRR, no MRG. No Carotid Bruits. No peripheral edema, warm, nontender Eyes: Conjunctivae clear without exudates or hemorrhage  Neuro: Detailed Neurologic Exam  Speech:    Speech is normal; fluent and spontaneous with normal comprehension.  Cognition:    The patient is oriented to person, place, and time;     recent and remote memory intact;     language fluent;     normal attention, concentration,     fund of knowledge Cranial Nerves:    The pupils are equal, round, and reactive to light.pupils too small to visualize fundi. Visual fields show slight impairment homonymous right quadrantanopia. Extraocular movements are intact. Trigeminal sensation is intact  and the muscles of mastication are normal. The face is symmetric. The palate elevates in the midline. Hearing intact. Voice is normal. Shoulder shrug is normal. The tongue has normal motion without fasciculations.   Coordination: No dysmetria or ataxia noted  Gait:    normal.   Motor Observation:    No asymmetry, no atrophy, and no involuntary movements noted. Tone:    Normal muscle tone.    Posture:    Posture is normal. normal erect    Strength:    Strength is V/V in the upper and lower limbs.      Sensation: intact to LT     Reflex Exam:  DTR's:    Deep tendon reflexes in the upper and lower extremities are symmetrical bilaterally.   Toes:    The toes are downgoing bilaterally.   Clonus:    Clonus is absent.    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  tPA at that time was not appropriate due to low hemoglobin and suspected hemolytic anemia.  Lower extremity venous Doppler showed a DVT and a 2D echo suggested a PE.  Due to location of stroke, patient also had quadrantanopia which has significantly improved, in fact  patient states she  is feeling well. She has some cramps and Dr. Marin Olp is decreasing her steroids.   -I discussed patient's hospital course and findings with her and her husband, answered all questions, her neuro exam is quite good with just some mild residual visual changes, and she is feeling very well.  Etiology was uncertain but thought to be related to hemolytic anemia versus paradoxical emboli with PE and DVT if a PFO  is present. -Patient is currently on anticoagulation.  Unclear how long she will remain on this medication.  We may consider TEE or TCD bubble study to rule out a PFO, since she is on anticoagulation this would not change management currently but may in the future.  I have reached out to Dr. Marin Olp. If she needs a TEE, she is a patient of Dr. Lauree Chandler.    I had a long d/w patient about her recent stroke, risk for recurrent stroke/TIAs, personally independently reviewed imaging studies and stroke evaluation results and answered questions.Continue Xarelto for secondary stroke prevention and maintain strict control of hypertension with blood pressure goal below 130/90, diabetes with hemoglobin A1c goal below 6.5% and lipids with LDL cholesterol goal below 70 mg/dL.   Cc: Rosalin Hawking, MD,  Lorene Dy, MD, Dr. Burney Gauze, Dr. Redgie Grayer, MD  [facility] [address] [place], [place] [address]  Phone [phone] Fax [phone]  I spent over 40 minutes of face-to-face and non-face-to-face time with patient on the  1. 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  This included previsit chart review, lab review, study review, order entry, electronic health record documentation, patient education on the different diagnostic and therapeutic options, counseling and coordination of care, risks and benefits of management, compliance, or risk factor reduction

## 2021-04-13 ENCOUNTER — Other Ambulatory Visit: Payer: Self-pay | Admitting: Neurology

## 2021-04-13 DIAGNOSIS — I6310 Cerebral infarction due to embolism of unspecified precerebral artery: Secondary | ICD-10-CM

## 2021-04-29 ENCOUNTER — Encounter: Payer: Self-pay | Admitting: Hematology & Oncology

## 2021-04-29 ENCOUNTER — Other Ambulatory Visit: Payer: Self-pay

## 2021-04-29 ENCOUNTER — Inpatient Hospital Stay: Payer: Medicare Other | Admitting: Hematology & Oncology

## 2021-04-29 ENCOUNTER — Inpatient Hospital Stay: Payer: Medicare Other | Attending: Hematology & Oncology

## 2021-04-29 VITALS — BP 120/72 | HR 88 | Temp 98.9°F | Resp 20 | Ht 65.0 in | Wt 171.4 lb

## 2021-04-29 DIAGNOSIS — Z8673 Personal history of transient ischemic attack (TIA), and cerebral infarction without residual deficits: Secondary | ICD-10-CM | POA: Insufficient documentation

## 2021-04-29 DIAGNOSIS — Z79899 Other long term (current) drug therapy: Secondary | ICD-10-CM | POA: Insufficient documentation

## 2021-04-29 DIAGNOSIS — Z7901 Long term (current) use of anticoagulants: Secondary | ICD-10-CM | POA: Diagnosis not present

## 2021-04-29 DIAGNOSIS — Z7952 Long term (current) use of systemic steroids: Secondary | ICD-10-CM | POA: Diagnosis not present

## 2021-04-29 DIAGNOSIS — Z86718 Personal history of other venous thrombosis and embolism: Secondary | ICD-10-CM | POA: Diagnosis not present

## 2021-04-29 DIAGNOSIS — D591 Autoimmune hemolytic anemia, unspecified: Secondary | ICD-10-CM | POA: Diagnosis not present

## 2021-04-29 DIAGNOSIS — G609 Hereditary and idiopathic neuropathy, unspecified: Secondary | ICD-10-CM

## 2021-04-29 DIAGNOSIS — D5911 Warm autoimmune hemolytic anemia: Secondary | ICD-10-CM | POA: Diagnosis present

## 2021-04-29 LAB — CBC WITH DIFFERENTIAL (CANCER CENTER ONLY)
Abs Immature Granulocytes: 0.29 10*3/uL — ABNORMAL HIGH (ref 0.00–0.07)
Basophils Absolute: 0 10*3/uL (ref 0.0–0.1)
Basophils Relative: 0 %
Eosinophils Absolute: 0 10*3/uL (ref 0.0–0.5)
Eosinophils Relative: 0 %
HCT: 44.7 % (ref 36.0–46.0)
Hemoglobin: 15.2 g/dL — ABNORMAL HIGH (ref 12.0–15.0)
Immature Granulocytes: 2 %
Lymphocytes Relative: 6 %
Lymphs Abs: 1 10*3/uL (ref 0.7–4.0)
MCH: 33.3 pg (ref 26.0–34.0)
MCHC: 34 g/dL (ref 30.0–36.0)
MCV: 98 fL (ref 80.0–100.0)
Monocytes Absolute: 0.8 10*3/uL (ref 0.1–1.0)
Monocytes Relative: 5 %
Neutro Abs: 14.7 10*3/uL — ABNORMAL HIGH (ref 1.7–7.7)
Neutrophils Relative %: 87 %
Platelet Count: 342 10*3/uL (ref 150–400)
RBC: 4.56 MIL/uL (ref 3.87–5.11)
RDW: 13.3 % (ref 11.5–15.5)
WBC Count: 16.8 10*3/uL — ABNORMAL HIGH (ref 4.0–10.5)
nRBC: 0 % (ref 0.0–0.2)

## 2021-04-29 LAB — MAGNESIUM: Magnesium: 2.1 mg/dL (ref 1.7–2.4)

## 2021-04-29 LAB — CMP (CANCER CENTER ONLY)
ALT: 24 U/L (ref 0–44)
AST: 23 U/L (ref 15–41)
Albumin: 4.3 g/dL (ref 3.5–5.0)
Alkaline Phosphatase: 44 U/L (ref 38–126)
Anion gap: 10 (ref 5–15)
BUN: 11 mg/dL (ref 8–23)
CO2: 27 mmol/L (ref 22–32)
Calcium: 10.5 mg/dL — ABNORMAL HIGH (ref 8.9–10.3)
Chloride: 101 mmol/L (ref 98–111)
Creatinine: 0.7 mg/dL (ref 0.44–1.00)
GFR, Estimated: >60 mL/min (ref >60)
Glucose, Bld: 119 mg/dL — ABNORMAL HIGH (ref 70–99)
Potassium: 4.4 mmol/L (ref 3.5–5.1)
Sodium: 138 mmol/L (ref 135–145)
Total Bilirubin: 0.7 mg/dL (ref 0.3–1.2)
Total Protein: 6.7 g/dL (ref 6.5–8.1)

## 2021-04-29 LAB — RETIC PANEL
Immature Retic Fract: 17 % — ABNORMAL HIGH (ref 2.3–15.9)
RBC.: 4.56 MIL/uL (ref 3.87–5.11)
Retic Count, Absolute: 101.2 10*3/uL (ref 19.0–186.0)
Retic Ct Pct: 2.2 % (ref 0.4–3.1)
Reticulocyte Hemoglobin: 36.4 pg (ref >27.9)

## 2021-04-29 LAB — SAVE SMEAR(SSMR), FOR PROVIDER SLIDE REVIEW

## 2021-04-29 LAB — LACTATE DEHYDROGENASE: LDH: 320 U/L — ABNORMAL HIGH (ref 98–192)

## 2021-04-29 MED ORDER — RIVAROXABAN 10 MG PO TABS: 10.0000 mg | ORAL_TABLET | Freq: Every day | ORAL | 3 refills | Status: DC

## 2021-04-29 NOTE — Progress Notes (Signed)
Hematology and Oncology Follow Up Visit  [name] [id number] 08/13/1946 75 y.o. 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 daily IVIG 1 g/kg x 2 days-given in the hospital Xarelto 10 mg p.o. daily-started on 01/08/2021 Folic acid 2 mg p.o. daily Rituxan 375 mg/m2 q week -- s/p week 2/4     Interim History:  [name] is back for her follow-up.  She continues to improve.  We are gradually tapering down her prednisone dose.  We will go from 20 mg a day down to 10 mg a day.  I told her to take 10 mg a day for 2 weeks and then go down to 5 mg a day.  Her reticulocyte count was only 2.2%.  This tells me that she is not hemolyzing.  She has had no issues with fever.  She has had occasional pain in the right upper thigh.  This might be from weight gain secondary to the prednisone.  She is on Xarelto for the lower extremity DVT.  We did do a Doppler on 03/25/2021.  Thankfully, the Doppler did not show any evidence of residual thrombus.  We went ahead and decrease her Xarelto down to 10 mg a day.  Her appetite is good.  She has had no nausea or vomiting.  She has had no rashes.  She has had no cough or shortness of breath.  There is no change in bowel or bladder habits.  Overall, her performance status is ECOG 1.    Medications:  Current Outpatient Medications:    acetaminophen (TYLENOL) 500 MG tablet, Take 1,000 mg by mouth every 6 (six) hours as needed for headache., Disp: , Rfl:    Calcium Carbonate+Vitamin D 600-200 MG-UNIT TABS, Take 2 tablets by mouth 2 (two) times daily., Disp: , Rfl:    folic acid (FOLVITE) 1 MG tablet, Take 2 tablets (2 mg total) by mouth daily., Disp: , Rfl:    gabapentin (NEURONTIN) 300 MG capsule, TAKE 1 CAPSULE BY MOUTH IN  THE MORNING , 1 CAPSULE BY  MOUTH IN THE  AFTERNOON ,  AND 2 CAPSULES BEFORE  BEDTIME (Patient taking differently: Take 300 mg by mouth 4 (four) times daily.), Disp: 360 capsule, Rfl: 0   HYDROcodone-acetaminophen (NORCO/VICODIN) 5-325 MG tablet, Take 1 tablet by mouth. As needed, Disp: , Rfl:    levothyroxine (SYNTHROID) 112 MCG tablet, Take 112 mcg by mouth daily before breakfast., Disp: , Rfl:    lidocaine-prilocaine (EMLA) cream, Apply to affected area once (Patient taking differently: Apply to affected area once PRN), Disp: 30 g, Rfl: 3   LORazepam (ATIVAN) 2 MG tablet, Take 2 mg by mouth 3 (three) times daily as needed for anxiety., Disp: , Rfl:    Multiple Vitamins-Minerals (MULTIVITAMIN WITH MINERALS) tablet, Take 1 tablet by mouth daily., Disp: , Rfl:    polycarbophil (FIBERCON) 625 MG tablet, Take 1,250 mg by mouth daily., Disp: , Rfl:    polyethylene glycol (MIRALAX / GLYCOLAX) 17 g packet, Take 17 g by mouth daily as needed for mild constipation., Disp: 14 each, Rfl: 0   polyvinyl alcohol (LIQUIFILM TEARS) 1.4 % ophthalmic solution, Place 1 drop into both eyes as needed for dry eyes., Disp: , Rfl:    predniSONE (DELTASONE) 20 MG tablet, Take 3 tablets (60 mg total) by mouth daily with breakfast. (Patient taking  differently: Take 20 mg by mouth daily with breakfast.), Disp: 90 tablet, Rfl: 0   rivaroxaban (XARELTO) 20 MG TABS tablet, Take 1 tablet (20 mg total) by mouth daily with supper. (Patient taking differently: Take 10 mg by mouth daily with supper.), Disp: 90 tablet, Rfl: 2   sertraline (ZOLOFT) 50 MG tablet, Take 50 mg by mouth daily., Disp: , Rfl:   Allergies:  Allergies  Allergen Reactions   Oxycodone Other (See Comments)    Hallucinations    Aspirin Other (See Comments)    Interferes with headaches   Imipenem Other (See Comments)    Warm and tingly all over   Statins Other (See Comments)    leg pain, muscle cramps   Cephalexin Other (See Comments)    Stomach cramps   Clindamycin/Lincomycin Nausea And Vomiting  and Rash   Contrast Media [Iodinated Diagnostic Agents] Rash   Losartan Potassium-Hctz Other (See Comments)    Unknown Reaction   Penicillins Rash    Did it involve swelling of the face/tongue/throat, SOB, or low BP? No Did it involve sudden or severe rash/hives, skin peeling, or any reaction on the inside of your mouth or nose? Yes Did you need to seek medical attention at a hospital or doctor's office? No When did it last happen?      2011 If all above answers are "NO", may proceed with cephalosporin use.    Quinolones Rash    Past Medical History, Surgical history, Social history, and Family History were reviewed and updated.  Review of Systems: Review of Systems  Constitutional:  Positive for fatigue.  HENT:  Negative.    Eyes: Negative.   Respiratory: Negative.    Gastrointestinal: Negative.   Endocrine: Negative.   Genitourinary: Negative.    Musculoskeletal: Negative.   Neurological:  Positive for numbness.  Hematological: Negative.   Psychiatric/Behavioral: Negative.     Physical Exam:  height is 5\' 5"  (1.651 m) and weight is 171 lb 6.4 oz (77.7 kg). Her oral temperature is 98.9 F (37.2 C). Her blood pressure is 120/72 and her pulse is 88. Her respiration is 20 and oxygen saturation is 100%.   Wt Readings from Last 3 Encounters:  04/29/21 171 lb 6.4 oz (77.7 kg)  04/02/21 164 lb 6 oz (74.6 kg)  03/25/21 163 lb (73.9 kg)    Physical Exam Vitals reviewed.  HENT:     Head: Normocephalic and atraumatic.  Eyes:     Pupils: Pupils are equal, round, and reactive to light.  Cardiovascular:     Rate and Rhythm: Normal rate and regular rhythm.     Heart sounds: Normal heart sounds.  Pulmonary:     Effort: Pulmonary effort is normal.     Breath sounds: Normal breath sounds.  Abdominal:     General: Bowel sounds are normal.     Palpations: Abdomen is soft.  Musculoskeletal:        General: No tenderness or deformity. Normal range of motion.     Cervical back:  Normal range of motion.     Comments: Extremities shows no clubbing, cyanosis or edema.  She has good range of motion of her joints.  There may be some slight decrease sensation in the pretibial region of the right lower leg.  She has no palpable venous cords.  She has negative Homans sign.  Lymphadenopathy:     Cervical: No cervical adenopathy.  Skin:    General: Skin is warm and dry.     Findings: No  erythema or rash.  Neurological:     Mental Status: She is alert and oriented to person, place, and time.  Psychiatric:        Behavior: Behavior normal.        Thought Content: Thought content normal.        Judgment: Judgment normal.     Lab Results  Component Value Date   WBC 16.8 (H) 04/29/2021   HGB 15.2 (H) 04/29/2021   HCT 44.7 04/29/2021   MCV 98.0 04/29/2021   PLT 342 04/29/2021     Chemistry      Component Value Date/Time   NA 138 04/29/2021 0905   NA 139 08/21/2020 1519   K 4.4 04/29/2021 0905   CL 101 04/29/2021 0905   CO2 27 04/29/2021 0905   BUN 11 04/29/2021 0905   BUN 8 08/21/2020 1519   CREATININE 0.70 04/29/2021 0905      Component Value Date/Time   CALCIUM 10.5 (H) 04/29/2021 0905   ALKPHOS 44 04/29/2021 0905   AST 23 04/29/2021 0905   ALT 24 04/29/2021 0905   BILITOT 0.7 04/29/2021 0905       Impression and Plan: Ms. Hokenson is a very charming 75 year old white female.  She has relapsed autoimmune hemolytic anemia.  Again this is idiopathic from all of our studies that we did when she first presented.  She is doing fantastic.  The hemolysis is clearly resolving.  She has a normal reticulocyte count.  Her hemoglobin keeps going up.  Hopefully, she will stay in remission for a while.  We will, again, decrease the prednisone down to 10 mg a day.  She will do 10 mg a day for 2 weeks and then go down to 5 mg a day.  She is on Xarelto at 10 mg a day.  We have to keep her on Xarelto for right now.  We will plan to get her back in late September.  Very  thin looks good at that point, she says she would like to transfer her care over to her family doctor.  I certainly have no problems with this.    Josph Macho, MD 8/10/202210:08 AM

## 2021-06-17 ENCOUNTER — Encounter: Payer: Self-pay | Admitting: Hematology & Oncology

## 2021-06-17 ENCOUNTER — Inpatient Hospital Stay: Payer: Medicare Other | Attending: Hematology & Oncology

## 2021-06-17 ENCOUNTER — Other Ambulatory Visit: Payer: Self-pay

## 2021-06-17 ENCOUNTER — Inpatient Hospital Stay: Payer: Medicare Other | Admitting: Hematology & Oncology

## 2021-06-17 VITALS — BP 137/63 | HR 71 | Temp 98.8°F | Resp 19 | Wt 174.0 lb

## 2021-06-17 DIAGNOSIS — D591 Autoimmune hemolytic anemia, unspecified: Secondary | ICD-10-CM | POA: Diagnosis not present

## 2021-06-17 DIAGNOSIS — Z7952 Long term (current) use of systemic steroids: Secondary | ICD-10-CM | POA: Diagnosis not present

## 2021-06-17 DIAGNOSIS — D5911 Warm autoimmune hemolytic anemia: Secondary | ICD-10-CM | POA: Diagnosis present

## 2021-06-17 DIAGNOSIS — Z7901 Long term (current) use of anticoagulants: Secondary | ICD-10-CM | POA: Insufficient documentation

## 2021-06-17 DIAGNOSIS — Z79899 Other long term (current) drug therapy: Secondary | ICD-10-CM | POA: Insufficient documentation

## 2021-06-17 LAB — CBC WITH DIFFERENTIAL (CANCER CENTER ONLY)
Abs Immature Granulocytes: 0.13 10*3/uL — ABNORMAL HIGH (ref 0.00–0.07)
Basophils Absolute: 0 10*3/uL (ref 0.0–0.1)
Basophils Relative: 0 %
Eosinophils Absolute: 0.2 10*3/uL (ref 0.0–0.5)
Eosinophils Relative: 3 %
HCT: 40 % (ref 36.0–46.0)
Hemoglobin: 13.4 g/dL (ref 12.0–15.0)
Immature Granulocytes: 2 %
Lymphocytes Relative: 19 %
Lymphs Abs: 1.5 10*3/uL (ref 0.7–4.0)
MCH: 32.4 pg (ref 26.0–34.0)
MCHC: 33.5 g/dL (ref 30.0–36.0)
MCV: 96.9 fL (ref 80.0–100.0)
Monocytes Absolute: 0.9 10*3/uL (ref 0.1–1.0)
Monocytes Relative: 11 %
Neutro Abs: 5.3 10*3/uL (ref 1.7–7.7)
Neutrophils Relative %: 65 %
Platelet Count: 383 10*3/uL (ref 150–400)
RBC: 4.13 MIL/uL (ref 3.87–5.11)
RDW: 12.7 % (ref 11.5–15.5)
WBC Count: 8.1 10*3/uL (ref 4.0–10.5)
nRBC: 0 % (ref 0.0–0.2)

## 2021-06-17 LAB — CMP (CANCER CENTER ONLY)
ALT: 15 U/L (ref 0–44)
AST: 16 U/L (ref 15–41)
Albumin: 4.2 g/dL (ref 3.5–5.0)
Alkaline Phosphatase: 44 U/L (ref 38–126)
Anion gap: 11 (ref 5–15)
BUN: 7 mg/dL — ABNORMAL LOW (ref 8–23)
CO2: 23 mmol/L (ref 22–32)
Calcium: 9.3 mg/dL (ref 8.9–10.3)
Chloride: 107 mmol/L (ref 98–111)
Creatinine: 0.65 mg/dL (ref 0.44–1.00)
GFR, Estimated: >60 mL/min (ref >60)
Glucose, Bld: 110 mg/dL — ABNORMAL HIGH (ref 70–99)
Potassium: 3.9 mmol/L (ref 3.5–5.1)
Sodium: 141 mmol/L (ref 135–145)
Total Bilirubin: 0.5 mg/dL (ref 0.3–1.2)
Total Protein: 6.5 g/dL (ref 6.5–8.1)

## 2021-06-17 LAB — RETICULOCYTES
Immature Retic Fract: 20.2 % — ABNORMAL HIGH (ref 2.3–15.9)
RBC.: 4.14 MIL/uL (ref 3.87–5.11)
Retic Count, Absolute: 116.3 10*3/uL (ref 19.0–186.0)
Retic Ct Pct: 2.8 % (ref 0.4–3.1)

## 2021-06-17 LAB — LACTATE DEHYDROGENASE: LDH: 174 U/L (ref 98–192)

## 2021-06-17 NOTE — Progress Notes (Signed)
Hematology and Oncology Follow Up Visit  [name] [id number] [birth year] 75 y.o. 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 daily --taper off on 06/17/2021 IVIG 1 g/kg x 2 days-given in the hospital Xarelto 10 mg p.o. daily-started on 01/08/2021 --complete 6 months on 06/20/2021 Folic acid 2 mg p.o. daily Rituxan 375 mg/m2 q week -- s/p week 4/4 --completed on 03/25/2021     Interim History:  [name] is back for her follow-up.  She looks great.  We will get her off the prednisone now.  She is only on 5 mg a day.  She is on folic acid.  She is doing well with the folic acid.  She will continue on folic acid.  At this point, I think that she is in remission.  Her reticulocyte count will tell us how she is doing.  Her reticulocyte count is low at 2.8%.  I think that we can probably let her go from the clinic.  It is a lot easier for her to be followed by her family doctor.  Dr. Su Hilt is such a thorough doctor.  He is "old school."  I know he will do a great job with her.  We will also stop her Xarelto.  She has been on this for 6 months.  Her last Doppler did not show any thromboembolic disease in her leg.  She still has little bit of numbness in the right lower leg.  I told her this may be permanent.  If he gets better, it could take another year or so.  She has had no problems with nausea or vomiting.  She has had no change in bowel or bladder habits.  She has had no rashes.  She has had no fever.  There is been no cough or shortness of breath.  Overall, I would say her performance status is ECOG 1.     Medications:  Current Outpatient Medications:    predniSONE (DELTASONE) 5 MG tablet, Take 5 mg by mouth daily with breakfast., Disp: , Rfl:    acetaminophen (TYLENOL) 500 MG tablet, Take  1,000 mg by mouth every 6 (six) hours as needed for headache., Disp: , Rfl:    Calcium Carbonate+Vitamin D 600-200 MG-UNIT TABS, Take 2 tablets by mouth 2 (two) times daily., Disp: , Rfl:    folic acid (FOLVITE) 1 MG tablet, Take 2 tablets (2 mg total) by mouth daily., Disp: , Rfl:    gabapentin (NEURONTIN) 300 MG capsule, TAKE 1 CAPSULE BY MOUTH IN  THE MORNING , 1 CAPSULE BY  MOUTH IN THE AFTERNOON ,  AND 2 CAPSULES BEFORE  BEDTIME (Patient taking differently: Take 300 mg by mouth 4 (four) times daily.), Disp: 360 capsule, Rfl: 0   HYDROcodone-acetaminophen (NORCO/VICODIN) 5-325 MG tablet, Take 1 tablet by mouth. As needed, Disp: , Rfl:    levothyroxine (SYNTHROID) 112 MCG tablet, Take 112 mcg by mouth daily before breakfast., Disp: , Rfl:    lidocaine-prilocaine (EMLA) cream, Apply to affected area once (Patient taking differently: Apply to affected area once PRN), Disp: 30 g, Rfl: 3   LORazepam (ATIVAN) 2 MG tablet, Take 2 mg by mouth 3 (three) times daily as needed for anxiety., Disp: , Rfl:    methocarbamol (ROBAXIN) 750 MG tablet, Take 750 mg by mouth 2 (two) times daily as needed., Disp: ,  Rfl:    Multiple Vitamins-Minerals (MULTIVITAMIN WITH MINERALS) tablet, Take 1 tablet by mouth daily., Disp: , Rfl:    polycarbophil (FIBERCON) 625 MG tablet, Take 1,250 mg by mouth daily., Disp: , Rfl:    polyethylene glycol (MIRALAX / GLYCOLAX) 17 g packet, Take 17 g by mouth daily as needed for mild constipation., Disp: 14 each, Rfl: 0   polyvinyl alcohol (LIQUIFILM TEARS) 1.4 % ophthalmic solution, Place 1 drop into both eyes as needed for dry eyes., Disp: , Rfl:    rivaroxaban (XARELTO) 10 MG TABS tablet, Take 1 tablet (10 mg total) by mouth daily. (Patient taking differently: Take 5 mg by mouth daily with supper.), Disp: 30 tablet, Rfl: 3   sertraline (ZOLOFT) 50 MG tablet, Take 50 mg by mouth daily., Disp: , Rfl:   Allergies:  Allergies  Allergen Reactions   Oxycodone Other (See Comments)     Hallucinations    Aspirin Other (See Comments)    Interferes with headaches   Imipenem Other (See Comments)    Warm and tingly all over   Statins Other (See Comments)    leg pain, muscle cramps   Cephalexin Other (See Comments)    Stomach cramps   Clindamycin/Lincomycin Nausea And Vomiting and Rash   Contrast Media [Iodinated Diagnostic Agents] Rash   Losartan Potassium-Hctz Other (See Comments)    Unknown Reaction   Penicillins Rash    Did it involve swelling of the face/tongue/throat, SOB, or low BP? No Did it involve sudden or severe rash/hives, skin peeling, or any reaction on the inside of your mouth or nose? Yes Did you need to seek medical attention at a hospital or doctor's office? No When did it last happen?      2011 If all above answers are "NO", may proceed with cephalosporin use.    Quinolones Rash    Past Medical History, Surgical history, Social history, and Family History were reviewed and updated.  Review of Systems: Review of Systems  Constitutional:  Positive for fatigue.  HENT:  Negative.    Eyes: Negative.   Respiratory: Negative.    Gastrointestinal: Negative.   Endocrine: Negative.   Genitourinary: Negative.    Musculoskeletal: Negative.   Neurological:  Positive for numbness.  Hematological: Negative.   Psychiatric/Behavioral: Negative.     Physical Exam:  weight is 174 lb (78.9 kg). Her oral temperature is 98.8 F (37.1 C). Her blood pressure is 137/63 and her pulse is 71. Her respiration is 19 and oxygen saturation is 95%.   Wt Readings from Last 3 Encounters:  06/17/21 174 lb (78.9 kg)  04/29/21 171 lb 6.4 oz (77.7 kg)  04/02/21 164 lb 6 oz (74.6 kg)    Physical Exam Vitals reviewed.  HENT:     Head: Normocephalic and atraumatic.  Eyes:     Pupils: Pupils are equal, round, and reactive to light.  Cardiovascular:     Rate and Rhythm: Normal rate and regular rhythm.     Heart sounds: Normal heart sounds.  Pulmonary:     Effort:  Pulmonary effort is normal.     Breath sounds: Normal breath sounds.  Abdominal:     General: Bowel sounds are normal.     Palpations: Abdomen is soft.  Musculoskeletal:        General: No tenderness or deformity. Normal range of motion.     Cervical back: Normal range of motion.     Comments: Extremities shows no clubbing, cyanosis or edema.  She has good  range of motion of her joints.  There may be some slight decrease sensation in the pretibial region of the right lower leg.  She has no palpable venous cords.  She has negative Homans sign.  Lymphadenopathy:     Cervical: No cervical adenopathy.  Skin:    General: Skin is warm and dry.     Findings: No erythema or rash.  Neurological:     Mental Status: She is alert and oriented to person, place, and time.  Psychiatric:        Behavior: Behavior normal.        Thought Content: Thought content normal.        Judgment: Judgment normal.     Lab Results  Component Value Date   WBC 8.1 06/17/2021   HGB 13.4 06/17/2021   HCT 40.0 06/17/2021   MCV 96.9 06/17/2021   PLT 383 06/17/2021     Chemistry      Component Value Date/Time   NA 141 06/17/2021 0955   NA 139 08/21/2020 1519   K 3.9 06/17/2021 0955   CL 107 06/17/2021 0955   CO2 23 06/17/2021 0955   BUN 7 (L) 06/17/2021 0955   BUN 8 08/21/2020 1519   CREATININE 0.65 06/17/2021 0955      Component Value Date/Time   CALCIUM 9.3 06/17/2021 0955   ALKPHOS 44 06/17/2021 0955   AST 16 06/17/2021 0955   ALT 15 06/17/2021 0955   BILITOT 0.5 06/17/2021 0955       Impression and Plan: [name] is a very charming 75 year old white female.  She has relapsed autoimmune hemolytic anemia.  Again this is idiopathic from all of our studies that we did when she first presented.  She is doing fantastic.  The hemolysis has clearly resolved.  She has a normal reticulocyte count.  Her hemoglobin is stable.  Again, we will let her go from the clinic now.  Will be easier for her to be  cared for by her family doctor who works a lot closer to where she lives.  I told her that we can always see her back if there is any issues with relapse.  She is a woman is very strong faith.  It is always good that we can have fellowship together.  I am just thankful that we could have fellowship together.    Josph Macho, MD 9/28/202211:29 AM

## 2021-06-18 ENCOUNTER — Telehealth: Payer: Self-pay | Admitting: *Deleted

## 2021-06-18 NOTE — Telephone Encounter (Signed)
No 06/17/21 los 

## 2021-06-23 NOTE — Progress Notes (Signed)
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 female with a hx of recent CVA and concern for paradoxical embolus via PFO. Primary cardiology patient of Dr. Clifton James.   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  She has undergone 6 months of anticoagulation therapy.  She has been seen by neurology who is requesting that we rule out a mechanism for paradoxical embolization through either an ASD or patent foramen ovale.  In further discussing with the patient, she not infrequently has episodes of palpitation.  According to her they do not last long.  She has no history of atrial fibrillation.  The patient has a longstanding history of migraine-like headaches.  Past Medical History:  Diagnosis Date   Anxiety    Dysphagia    HYPERTHYROIDISM    LYMPHADENOPATHY    MIGRAINE HEADACHE    Neuromuscular disorder (HCC)    condition where muscle separates from bone left chest, quarter size, pain intermittent    Past Surgical History:  Procedure Laterality Date   BALLOON DILATION N/A 09/18/2020   Procedure: BALLOON DILATION;  Surgeon: Kerin Salen, MD;  Location: [facility];  Service: Gastroenterology;  Laterality: N/A;   ESOPHAGOGASTRODUODENOSCOPY (EGD) WITH PROPOFOL N/A 09/18/2020   Procedure: ESOPHAGOGASTRODUODENOSCOPY (EGD) WITH PROPOFOL;  Surgeon: Kerin Salen, MD;  Location: [facility];  Service: Gastroenterology;  Laterality: N/A;   EYE SURGERY Bilateral    Cataract L eye 1-20, R eye 3-20    FOREIGN BODY REMOVAL  09/18/2020   Procedure: FOREIGN BODY REMOVAL;  Surgeon: Kerin Salen, MD;  Location: [facility];  Service: Gastroenterology;;   MOHS SURGERY  2001    REVERSE SHOULDER ARTHROPLASTY Right 03/16/2019   Procedure: REVERSE SHOULDER ARTHROPLASTY;  Surgeon: Beverely Low, MD;  Location: [facility];  Service: Orthopedics;  Laterality: Right;  interscalene block   TUBAL LIGATION      Current Medications: Current Meds  Medication Sig   acetaminophen (TYLENOL) 500 MG tablet Take 1,000 mg by mouth every 6 (six) hours as needed for headache.   Calcium Carbonate+Vitamin D 600-200 MG-UNIT TABS Take 2 tablets by mouth 2 (two) times daily.   folic acid (FOLVITE) 1 MG tablet Take 2 tablets (2 mg total) by mouth daily.   gabapentin (NEURONTIN) 300 MG capsule TAKE 1 CAPSULE BY MOUTH IN  THE MORNING , 1 CAPSULE BY  MOUTH IN THE AFTERNOON ,  AND 2 CAPSULES BEFORE  BEDTIME (Patient taking differently: Take 300 mg by mouth 4 (four) times daily.)   HYDROcodone-acetaminophen (NORCO/VICODIN) 5-325 MG tablet Take 1 tablet by mouth. As needed   levothyroxine (SYNTHROID) 112 MCG tablet Take 112 mcg by mouth daily before breakfast.   lidocaine-prilocaine (EMLA) cream Apply to affected area once (Patient taking differently: Apply to affected area once PRN)   LORazepam (ATIVAN) 2 MG tablet Take 2 mg by mouth 3 (three) times daily as needed for anxiety.   methocarbamol (ROBAXIN) 750 MG tablet Take 750 mg by mouth 2 (two) times daily as needed.   Multiple Vitamins-Minerals (MULTIVITAMIN WITH MINERALS) tablet Take 1 tablet by mouth daily.   polycarbophil (FIBERCON) 625  MG tablet Take 1,250 mg by mouth daily.   polyethylene glycol (MIRALAX / GLYCOLAX) 17 g packet Take 17 g by mouth daily as needed for mild constipation.   polyvinyl alcohol (LIQUIFILM TEARS) 1.4 % ophthalmic solution Place 1 drop into both eyes as needed for dry eyes.   sertraline (ZOLOFT) 50 MG tablet Take 50 mg by mouth daily.     Allergies:   Oxycodone, Aspirin, Imipenem, Statins, Cephalexin, Clindamycin/lincomycin, Contrast media [iodinated diagnostic agents], Losartan potassium-hctz, Penicillins, and  Quinolones   Social History   Socioeconomic History   Marital status: Married    Spouse name: Not on file   Number of children: Not on file   Years of education: Not on file   Highest education level: Not on file  Occupational History   Not on file  Tobacco Use   Smoking status: Former    Types: Cigarettes    Quit date: 1969    Years since quitting: 53.7   Smokeless tobacco: Never  Vaping Use   Vaping Use: Never used  Substance and Sexual Activity   Alcohol use: No   Drug use: No   Sexual activity: Not on file  Other Topics Concern   Not on file  Social History Narrative   Not on file   Social Determinants of Health   Financial Resource Strain: Not on file  Food Insecurity: Not on file  Transportation Needs: Not on file  Physical Activity: Not on file  Stress: Not on file  Social Connections: Not on file     Family History: The patient's family history includes Arthritis in her brother and sister; Diabetes in her brother and brother; Heart disease in her mother; Neuropathy in her brother and sister; Stroke in her father.  ROS:   Please see the history of present illness.    Longstanding all other systems reviewed and are negative.  EKGs/Labs/Other Studies Reviewed:    The following studies were reviewed today:  2D Doppler echocardiogram 01/06/2021: IMPRESSIONS     1. Left ventricular ejection fraction, by estimation, is 60 to 65%. The  left ventricle has normal function. The left ventricle has no regional  wall motion abnormalities. Left ventricular diastolic parameters are  consistent with Grade I diastolic  dysfunction (impaired relaxation).   2. McConnell's sign appears to be present.. Right ventricular systolic  function is mildly reduced. The right ventricular size is moderately  enlarged. There is moderately elevated pulmonary artery systolic pressure.   3. Right atrial size was moderately dilated.   4. The mitral valve is normal in structure. Mild  mitral valve  regurgitation. No evidence of mitral stenosis.   5. Tricuspid valve regurgitation is moderate.   6. The aortic valve is tricuspid. Aortic valve regurgitation is not  visualized. No aortic stenosis is present.   7. The inferior vena cava is dilated in size with <50% respiratory  variability, suggesting right atrial pressure of 15 mmHg.   Comparison(s): Prior images unable to be directly viewed, comparison made  by report only. Findings suggest cor pulmonale, possibly acute. Consider  pulmonary embolism. Discussed with primary team.    EKG:  EKG not repeated  Recent Labs: 03/05/2021: TSH 0.902 04/29/2021: Magnesium 2.1 06/17/2021: ALT 15; BUN 7; Creatinine 0.65; Hemoglobin 13.4; Platelet Count 383; Potassium 3.9; Sodium 141  Recent Lipid Panel    Component Value Date/Time   CHOL 158 01/05/2021 0317   TRIG 70 01/05/2021 0317   HDL 44 01/05/2021 0317   CHOLHDL 3.6 01/05/2021 0317  VLDL 14 01/05/2021 0317   LDLCALC 100 (H) 01/05/2021 0317    Physical Exam:    VS:  BP 118/76   Pulse 97   Ht 5\' 5"  (1.651 m)   Wt 175 lb (79.4 kg)   SpO2 94%   BMI 29.12 kg/m     Wt Readings from Last 3 Encounters:  06/24/21 175 lb (79.4 kg)  06/17/21 174 lb (78.9 kg)  04/29/21 171 lb 6.4 oz (77.7 kg)     GEN: Overweight. No acute distress HEENT: Normal NECK: No JVD. LYMPHATICS: No lymphadenopathy CARDIAC: No murmur. RRR no gallop, or edema. VASCULAR:  Normal Pulses. No bruits. RESPIRATORY:  Clear to auscultation without rales, wheezing or rhonchi  ABDOMEN: Soft, non-tender, non-distended, No pulsatile mass, MUSCULOSKELETAL: No deformity  SKIN: Warm and dry NEUROLOGIC:  Alert and oriented x 3 PSYCHIATRIC:  Normal affect   ASSESSMENT:    1. Paradoxical embolus (HCC)   2. PVC (premature ventricular contraction)   3. 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 Pulmonary embolism without acute cor pulmonale, unspecified chronicity, unspecified  pulmonary embolism type (HCC)    PLAN:    In order of problems listed above:  This is possible.  Transesophageal echo will be done to exclude patent foramen ovale and or small previously unrecognized ASD that could be the conduit for paradoxical embolization.  I will also order a 30-day monitor to exclude a more common source of multifocal CVA which is atrial fibrillation.  If there is a patent foramen or ASD, closure would be a consideration. Not discussed Attempting to determine the etiology. Anticoagulation therapy has been discontinued.     Medication Adjustments/Labs and Tests Ordered: Current medicines are reviewed at length with the patient today.  Concerns regarding medicines are outlined above.  Orders Placed This Encounter  Procedures   Basic metabolic panel   CBC   Cardiac event monitor    No orders of the defined types were placed in this encounter.   Patient Instructions  Medication Instructions:  Your physician recommends that you continue on your current medications as directed. Please refer to the Current Medication list given to you today.  *If you need a refill on your cardiac medications before your next appointment, please call your pharmacy*   Lab Work: BMET and CBC today  If you have labs (blood work) drawn today and your tests are completely normal, you will receive your results only by: MyChart Message (if you have MyChart) OR A paper copy in the mail If you have any lab test that is abnormal or we need to change your treatment, we will call you to review the results.   Testing/Procedures: Your physician has requested that you have a TEE. During a TEE, sound waves are used to create images of your heart. It provides your doctor with information about the size and shape of your heart and how well your heart's chambers and valves are working. In this test, a transducer is attached to the end of a flexible tube that's guided down your throat and into  your esophagus (the tube leading from you mouth to your stomach) to get a more detailed image of your heart. You are not awake for the procedure. Please see the instruction sheet given to you today. For further information please visit 06/29/21.  Your physician has recommended that you wear an event monitor. Event monitors are medical devices that record the heart's electrical activity. Doctors most often [email or web address] these monitors  to diagnose arrhythmias. Arrhythmias are problems with the speed or rhythm of the heartbeat. The monitor is a small, portable device. You can wear one while you do your normal daily activities. This is usually used to diagnose what is causing palpitations/syncope (passing out).   Follow-Up: At [facility], you and your health needs are our priority.  As part of our continuing mission to provide you with exceptional heart care, we have created designated Provider Care Teams.  These Care Teams include your primary Cardiologist (physician) and Advanced Practice Providers (APPs -  Physician Assistants and Nurse Practitioners) who all work together to provide you with the care you need, when you need it.  We recommend signing up for the patient portal called "MyChart".  Sign up information is provided on this After Visit Summary.  MyChart is used to connect with patients for Virtual Visits (Telemedicine).  Patients are able to view lab/test results, encounter notes, upcoming appointments, etc.  Non-urgent messages can be sent to your provider as well.   To learn more about what you can do with MyChart, go to [email or web address].    Your next appointment:   As needed  The format for your next appointment:   In Person  Provider:   You may see Verdis Prime, MD or one of the following Advanced Practice Providers on your designated Care Team:   Nada Boozer, NP    Other Instructions  TEE INSTRUCTIONS: You are scheduled for a TEE on Friday, October 14th with Dr.  Jens Som.  Please arrive at the [facility] (Main Entrance A) at [facility]: [address] [place], [place] [address] at 8 am. You are allowed ONE visitor in the waiting room during your procedure. Both you and your guest must wear masks.  DIET: Nothing to eat or drink after midnight except a sip of water with medications.  MEDICATION INSTRUCTIONS:  1) You may take your other medications as directed with sips of water.  LABS:  You had labs drawn today  You must have a responsible person to drive you home and stay in the waiting area during your procedure. Failure to do so could result in cancellation.  Bring your insurance cards.  *Special Note: Every effort is made to have your procedure done on time. Occasionally there are emergencies that occur at the hospital that may cause delays. Please be patient if a delay does occur.     [employer] Cardiac Event Monitor Instructions Your physician has requested you wear your cardiac event monitor for _____ days, (1-30). [employer] may call or text to confirm a shipping address. The monitor will be sent to a land address via UPS. [employer] will not ship a monitor to a PO BOX. It typically takes 3-5 days to receive your monitor after it has been enrolled. [employer] will assist with USPS tracking if your package is delayed. The telephone number for [employer] is [phone]. Once you have received your monitor, please review the enclosed instructions. Instruction tutorials can also be viewed under help and settings on the enclosed cell phone. Your monitor has already been registered assigning a specific monitor serial # to you.  Applying the monitor Remove cell phone from case and turn it on. The cell phone works as IT consultant and needs to be within [employer] of you at all times. The cell phone will need to be charged on a daily basis. We recommend you plug the cell phone into the enclosed charger at your bedside table every  night.  Monitor batteries: You will receive two monitor batteries labelled #1 and #2. These are your recorders. Plug battery #2 onto the second connection on the enclosed charger. Keep one battery on the charger at all times. This will keep the monitor battery deactivated. It will also keep it fully charged for when you need to switch your monitor batteries. A small light will be blinking on the battery emblem when it is charging. The light on the battery emblem will remain on when the battery is fully charged.  Open package of a Monitor strip. Insert battery #1 into black hood on strip and gently squeeze monitor battery onto connection as indicated in instruction booklet. Set aside while preparing skin.  Choose location for your strip, vertical or horizontal, as indicated in the instruction booklet. Shave to remove all hair from location. There cannot be any lotions, oils, powders, or colognes on skin where monitor is to be applied. Wipe skin clean with enclosed Saline wipe. Dry skin completely.  Peel paper labeled #1 off the back of the Monitor strip exposing the adhesive. Place the monitor on the chest in the vertical or horizontal position shown in the instruction booklet. One arrow on the monitor strip must be pointing upward. Carefully remove paper labeled #2, attaching remainder of strip to your skin. Try not to create any folds or wrinkles in the strip as you apply it.  Firmly press and release the circle in the center of the monitor battery. You will hear a small beep. This is turning the monitor battery on. The heart emblem on the monitor battery will light up every 5 seconds if the monitor battery in turned on and connected to the patient securely. Do not push and hold the circle down as this turns the monitor battery off. The cell phone will locate the monitor battery. A screen will appear on the cell phone checking the connection of your monitor strip. This may read poor  connection initially but change to good connection within the next minute. Once your monitor accepts the connection you will hear a series of 3 beeps followed by a climbing crescendo of beeps. A screen will appear on the cell phone showing the two monitor strip placement options. Touch the picture that demonstrates where you applied the monitor strip.  Your monitor strip and battery are waterproof. You are able to shower, bathe, or swim with the monitor on. They just ask you do not submerge deeper than 3 feet underwater. We recommend removing the monitor if you are swimming in a lake, river, or ocean.  Your monitor battery will need to be switched to a fully charged monitor battery approximately once a week. The cell phone will alert you of an action which needs to be made.  On the cell phone, tap for details to reveal connection status, monitor battery status, and cell phone battery status. The green dots indicates your monitor is in good status. A red dot indicates there is something that needs your attention.  To record a symptom, click the circle on the monitor battery. In 30-60 seconds a list of symptoms will appear on the cell phone. Select your symptom and tap save. Your monitor will record a sustained or significant arrhythmia regardless of you clicking the button. Some patients do not feel the heart rhythm irregularities. [employer] will notify us of any serious or critical events.  Refer to instruction booklet for instructions on switching batteries, changing strips, the Do not disturb or Pause features, or  any additional questions.  Call [employer] at [phone], to confirm your monitor is transmitting and record your baseline. They will answer any questions you may have regarding the monitor instructions at that time.  Returning the monitor to [employer] Place all equipment back into blue box. Peel off strip of paper to expose adhesive and close box securely. There is a  prepaid UPS shipping label on this box. Drop in a UPS drop box, or at a UPS facility like [employer]. You may also contact [employer] to arrange UPS to pick up monitor package at your home.   Signed, Lesleigh Noe, MD  06/24/2021 5:16 PM    [facility]

## 2021-06-24 ENCOUNTER — Other Ambulatory Visit: Payer: Self-pay

## 2021-06-24 ENCOUNTER — Ambulatory Visit: Payer: Medicare Other | Admitting: Interventional Cardiology

## 2021-06-24 ENCOUNTER — Encounter: Payer: Self-pay | Admitting: Interventional Cardiology

## 2021-06-24 ENCOUNTER — Encounter: Payer: Self-pay | Admitting: Radiology

## 2021-06-24 VITALS — BP 118/76 | HR 97 | Ht 65.0 in | Wt 175.0 lb

## 2021-06-24 DIAGNOSIS — I2699 Other pulmonary embolism without acute cor pulmonale: Secondary | ICD-10-CM

## 2021-06-24 DIAGNOSIS — I6310 Cerebral infarction due to embolism of unspecified precerebral artery: Secondary | ICD-10-CM | POA: Diagnosis not present

## 2021-06-24 DIAGNOSIS — I749 Embolism and thrombosis of unspecified artery: Secondary | ICD-10-CM

## 2021-06-24 DIAGNOSIS — I493 Ventricular premature depolarization: Secondary | ICD-10-CM | POA: Diagnosis not present

## 2021-06-24 NOTE — Patient Instructions (Addendum)
Medication Instructions:  Your physician recommends that you continue on your current medications as directed. Please refer to the Current Medication list given to you today.  *If you need a refill on your cardiac medications before your next appointment, please call your pharmacy*   Lab Work: BMET and CBC today  If you have labs (blood work) drawn today and your tests are completely normal, you will receive your results only by: MyChart Message (if you have MyChart) OR A paper copy in the mail If you have any lab test that is abnormal or we need to change your treatment, we will call you to review the results.   Testing/Procedures: Your physician has requested that you have a TEE. During a TEE, sound waves are used to create images of your heart. It provides your doctor with information about the size and shape of your heart and how well your heart's chambers and valves are working. In this test, a transducer is attached to the end of a flexible tube that's guided down your throat and into your esophagus (the tube leading from you mouth to your stomach) to get a more detailed image of your heart. You are not awake for the procedure. Please see the instruction sheet given to you today. For further information please visit [email or web address]  Your physician has recommended that you wear an event monitor. Event monitors are medical devices that record the heart's electrical activity. Doctors most often [place] these monitors to diagnose arrhythmias. Arrhythmias are problems with the speed or rhythm of the heartbeat. The monitor is a small, portable device. You can wear one while you do your normal daily activities. This is usually used to diagnose what is causing palpitations/syncope (passing out).   Follow-Up: At [facility], you and your health needs are our priority.  As part of our continuing mission to provide you with exceptional heart care, we have created designated Provider Care Teams.   These Care Teams include your primary Cardiologist (physician) and Advanced Practice Providers (APPs -  Physician Assistants and Nurse Practitioners) who all work together to provide you with the care you need, when you need it.  We recommend signing up for the patient portal called "MyChart".  Sign up information is provided on this After Visit Summary.  MyChart is used to connect with patients for Virtual Visits (Telemedicine).  Patients are able to view lab/test results, encounter notes, upcoming appointments, etc.  Non-urgent messages can be sent to your provider as well.   To learn more about what you can do with MyChart, go to [email or web address].    Your next appointment:   As needed  The format for your next appointment:   In Person  Provider:   You may see Verdis Prime, MD or one of the following Advanced Practice Providers on your designated Care Team:   Nada Boozer, NP    Other Instructions  TEE INSTRUCTIONS: You are scheduled for a TEE on Friday, October 14th with Dr. Jens Som.  Please arrive at the [facility] (Main Entrance A) at [facility]: [address] [place], [place] [address] at 8 am. You are allowed ONE visitor in the waiting room during your procedure. Both you and your guest must wear masks.  DIET: Nothing to eat or drink after midnight except a sip of water with medications.  MEDICATION INSTRUCTIONS:  1) You may take your other medications as directed with sips of water.  LABS:  You had labs drawn today  You must have  a responsible person to drive you home and stay in the waiting area during your procedure. Failure to do so could result in cancellation.  Bring your insurance cards.  *Special Note: Every effort is made to have your procedure done on time. Occasionally there are emergencies that occur at the hospital that may cause delays. Please be patient if a delay does occur.     [employer] Cardiac Event Monitor Instructions Your physician  has requested you wear your cardiac event monitor for _____ days, (1-30). [employer] may call or text to confirm a shipping address. The monitor will be sent to a land address via UPS. [employer] will not ship a monitor to a PO BOX. It typically takes 3-5 days to receive your monitor after it has been enrolled. [employer] will assist with USPS tracking if your package is delayed. The telephone number for [employer] is [phone]. Once you have received your monitor, please review the enclosed instructions. Instruction tutorials can also be viewed under help and settings on the enclosed cell phone. Your monitor has already been registered assigning a specific monitor serial # to you.  Applying the monitor Remove cell phone from case and turn it on. The cell phone works as IT consultant and needs to be within [employer] of you at all times. The cell phone will need to be charged on a daily basis. We recommend you plug the cell phone into the enclosed charger at your bedside table every night.  Monitor batteries: You will receive two monitor batteries labelled #1 and #2. These are your recorders. Plug battery #2 onto the second connection on the enclosed charger. Keep one battery on the charger at all times. This will keep the monitor battery deactivated. It will also keep it fully charged for when you need to switch your monitor batteries. A small light will be blinking on the battery emblem when it is charging. The light on the battery emblem will remain on when the battery is fully charged.  Open package of a Monitor strip. Insert battery #1 into black hood on strip and gently squeeze monitor battery onto connection as indicated in instruction booklet. Set aside while preparing skin.  Choose location for your strip, vertical or horizontal, as indicated in the instruction booklet. Shave to remove all hair from location. There cannot be any lotions, oils, powders, or colognes on skin where  monitor is to be applied. Wipe skin clean with enclosed Saline wipe. Dry skin completely.  Peel paper labeled #1 off the back of the Monitor strip exposing the adhesive. Place the monitor on the chest in the vertical or horizontal position shown in the instruction booklet. One arrow on the monitor strip must be pointing upward. Carefully remove paper labeled #2, attaching remainder of strip to your skin. Try not to create any folds or wrinkles in the strip as you apply it.  Firmly press and release the circle in the center of the monitor battery. You will hear a small beep. This is turning the monitor battery on. The heart emblem on the monitor battery will light up every 5 seconds if the monitor battery in turned on and connected to the patient securely. Do not push and hold the circle down as this turns the monitor battery off. The cell phone will locate the monitor battery. A screen will appear on the cell phone checking the connection of your monitor strip. This may read poor connection initially but change to good connection within the next minute. Once your  monitor accepts the connection you will hear a series of 3 beeps followed by a climbing crescendo of beeps. A screen will appear on the cell phone showing the two monitor strip placement options. Touch the picture that demonstrates where you applied the monitor strip.  Your monitor strip and battery are waterproof. You are able to shower, bathe, or swim with the monitor on. They just ask you do not submerge deeper than 3 feet underwater. We recommend removing the monitor if you are swimming in a lake, river, or ocean.  Your monitor battery will need to be switched to a fully charged monitor battery approximately once a week. The cell phone will alert you of an action which needs to be made.  On the cell phone, tap for details to reveal connection status, monitor battery status, and cell phone battery status. The green dots indicates  your monitor is in good status. A red dot indicates there is something that needs your attention.  To record a symptom, click the circle on the monitor battery. In 30-60 seconds a list of symptoms will appear on the cell phone. Select your symptom and tap save. Your monitor will record a sustained or significant arrhythmia regardless of you clicking the button. Some patients do not feel the heart rhythm irregularities. [employer] will notify us of any serious or critical events.  Refer to instruction booklet for instructions on switching batteries, changing strips, the Do not disturb or Pause features, or any additional questions.  Call [employer] at [phone], to confirm your monitor is transmitting and record your baseline. They will answer any questions you may have regarding the monitor instructions at that time.  Returning the monitor to [employer] Place all equipment back into blue box. Peel off strip of paper to expose adhesive and close box securely. There is a prepaid UPS shipping label on this box. Drop in a UPS drop box, or at a UPS facility like [employer]. You may also contact [employer] to arrange UPS to pick up monitor package at your home.

## 2021-06-24 NOTE — Progress Notes (Signed)
Enrolled patient for a 30 day [employer] event monitor to be mailed to patients home  

## 2021-06-25 ENCOUNTER — Encounter (HOSPITAL_COMMUNITY): Payer: Self-pay | Admitting: Cardiology

## 2021-06-25 ENCOUNTER — Encounter (HOSPITAL_COMMUNITY): Payer: Self-pay | Admitting: Hematology & Oncology

## 2021-06-25 LAB — CBC
Hematocrit: 40.9 % (ref 34.0–46.6)
Hemoglobin: 13.9 g/dL (ref 11.1–15.9)
MCH: 32.1 pg (ref 26.6–33.0)
MCHC: 34 g/dL (ref 31.5–35.7)
MCV: 95 fL (ref 79–97)
Platelets: 408 10*3/uL (ref 150–450)
RBC: 4.33 x10E6/uL (ref 3.77–5.28)
RDW: 12.5 % (ref 11.7–15.4)
WBC: 9.3 10*3/uL (ref 3.4–10.8)

## 2021-06-25 LAB — BASIC METABOLIC PANEL
BUN/Creatinine Ratio: 9 — ABNORMAL LOW (ref 12–28)
BUN: 6 mg/dL — ABNORMAL LOW (ref 8–27)
CO2: 21 mmol/L (ref 20–29)
Calcium: 9.9 mg/dL (ref 8.7–10.3)
Chloride: 102 mmol/L (ref 96–106)
Creatinine, Ser: 0.7 mg/dL (ref 0.57–1.00)
Glucose: 110 mg/dL — ABNORMAL HIGH (ref 70–99)
Potassium: 3.9 mmol/L (ref 3.5–5.2)
Sodium: 139 mmol/L (ref 134–144)
eGFR: 91 mL/min/{1.73_m2} (ref >59)

## 2021-06-26 ENCOUNTER — Encounter (HOSPITAL_COMMUNITY): Payer: Self-pay | Admitting: Cardiology

## 2021-06-30 NOTE — Progress Notes (Signed)
Attempted to obtain medical history via telephone, unable to reach at this time. I left a voicemail to return pre surgical testing department's phone call.  

## 2021-07-02 NOTE — Progress Notes (Signed)
TEE being done to r/o PFO/ASD that could have facilitated paradoxical cerebral embolism.

## 2021-07-03 ENCOUNTER — Telehealth: Payer: Self-pay | Admitting: Interventional Cardiology

## 2021-07-03 ENCOUNTER — Ambulatory Visit (HOSPITAL_BASED_OUTPATIENT_CLINIC_OR_DEPARTMENT_OTHER)
Admission: RE | Admit: 2021-07-03 | Discharge: 2021-07-03 | Disposition: A | Discharge status: Home / Self Care | Payer: Medicare Other | Source: Ambulatory Visit | Attending: Interventional Cardiology | Admitting: Interventional Cardiology

## 2021-07-03 ENCOUNTER — Ambulatory Visit (INDEPENDENT_AMBULATORY_CARE_PROVIDER_SITE_OTHER): Payer: Medicare Other

## 2021-07-03 ENCOUNTER — Ambulatory Visit (HOSPITAL_COMMUNITY): Payer: Medicare Other | Admitting: Anesthesiology

## 2021-07-03 ENCOUNTER — Ambulatory Visit (HOSPITAL_COMMUNITY)
Admission: RE | Admit: 2021-07-03 | Discharge: 2021-07-03 | Disposition: A | Discharge status: Home / Self Care | Payer: Medicare Other | Attending: Cardiology | Admitting: Cardiology

## 2021-07-03 ENCOUNTER — Encounter (HOSPITAL_COMMUNITY): Payer: Self-pay | Admitting: Cardiology

## 2021-07-03 ENCOUNTER — Other Ambulatory Visit: Payer: Self-pay

## 2021-07-03 ENCOUNTER — Encounter (HOSPITAL_COMMUNITY)
Admission: RE | Disposition: A | Discharge status: Home / Self Care | Payer: Self-pay | Source: Home / Self Care | Attending: Cardiology

## 2021-07-03 DIAGNOSIS — I340 Nonrheumatic mitral (valve) insufficiency: Secondary | ICD-10-CM | POA: Insufficient documentation

## 2021-07-03 DIAGNOSIS — I700 Atherosclerosis of aorta: Secondary | ICD-10-CM | POA: Diagnosis not present

## 2021-07-03 DIAGNOSIS — Q2112 Patent foramen ovale: Secondary | ICD-10-CM

## 2021-07-03 DIAGNOSIS — I493 Ventricular premature depolarization: Secondary | ICD-10-CM

## 2021-07-03 DIAGNOSIS — Z881 Allergy status to other antibiotic agents status: Secondary | ICD-10-CM | POA: Diagnosis not present

## 2021-07-03 DIAGNOSIS — Z888 Allergy status to other drugs, medicaments and biological substances status: Secondary | ICD-10-CM | POA: Diagnosis not present

## 2021-07-03 DIAGNOSIS — I2609 Other pulmonary embolism with acute cor pulmonale: Secondary | ICD-10-CM | POA: Diagnosis not present

## 2021-07-03 DIAGNOSIS — I6310 Cerebral infarction due to embolism of unspecified precerebral artery: Secondary | ICD-10-CM | POA: Diagnosis not present

## 2021-07-03 DIAGNOSIS — Z886 Allergy status to analgesic agent status: Secondary | ICD-10-CM | POA: Diagnosis not present

## 2021-07-03 DIAGNOSIS — Z87891 Personal history of nicotine dependence: Secondary | ICD-10-CM | POA: Insufficient documentation

## 2021-07-03 DIAGNOSIS — Z91041 Radiographic dye allergy status: Secondary | ICD-10-CM | POA: Insufficient documentation

## 2021-07-03 DIAGNOSIS — Z885 Allergy status to narcotic agent status: Secondary | ICD-10-CM | POA: Diagnosis not present

## 2021-07-03 DIAGNOSIS — Z79899 Other long term (current) drug therapy: Secondary | ICD-10-CM | POA: Diagnosis not present

## 2021-07-03 DIAGNOSIS — Z880 Allergy status to penicillin: Secondary | ICD-10-CM | POA: Diagnosis not present

## 2021-07-03 DIAGNOSIS — I639 Cerebral infarction, unspecified: Secondary | ICD-10-CM

## 2021-07-03 DIAGNOSIS — Z79890 Hormone replacement therapy (postmenopausal): Secondary | ICD-10-CM | POA: Insufficient documentation

## 2021-07-03 HISTORY — PX: TEE WITHOUT CARDIOVERSION: SHX5443

## 2021-07-03 HISTORY — PX: BUBBLE STUDY: SHX6837

## 2021-07-03 SURGERY — ECHOCARDIOGRAM, TRANSESOPHAGEAL
Anesthesia: Monitor Anesthesia Care

## 2021-07-03 MED ORDER — PROPOFOL 500 MG/50ML IV EMUL
INTRAVENOUS | Status: DC | PRN
  Administered 2021-07-03: 100 ug/kg/min via INTRAVENOUS
  Administered 2021-07-03: 200 ug/kg/min via INTRAVENOUS

## 2021-07-03 MED ORDER — PROPOFOL 10 MG/ML IV BOLUS
INTRAVENOUS | Status: DC | PRN
  Administered 2021-07-03 (×2): 20 mg via INTRAVENOUS
  Administered 2021-07-03: 10 mg via INTRAVENOUS

## 2021-07-03 MED ORDER — SODIUM CHLORIDE 0.9 % IV SOLN
INTRAVENOUS | Status: DC | PRN
Start: 2021-07-03 — End: 2021-07-03

## 2021-07-03 NOTE — Progress Notes (Signed)
    Transesophageal Echocardiogram Note  [name] [id number] [birth year]  Procedure: Transesophageal Echocardiogram Indications: CVA  Procedure Details Consent: Obtained Time Out: Verified patient identification, verified procedure, site/side was marked, verified correct patient position, special equipment/implants available, Radiology Safety Procedures followed,  medications/allergies/relevent history reviewed, required imaging and test results available.  Performed  Medications:  Pt sedated by anesthesia with diprovan 420 mg IV total.   Normal LV function; mild to moderate MR; patent foramen ovale noted; positive saline microcavitation study.   Complications: No apparent complications Patient did tolerate procedure well.  Olga Millers, MD

## 2021-07-03 NOTE — H&P (Signed)
Office Visit 06/24/2021 [facility] Lyn Records, MD Cardiology Paradoxical embolus [facility]) +3 more Dx Follow-up ; Referred by Anson Fret, MD Reason for Visit   Additional Documentation  Vitals:  BP 118/76 Pulse 97 Ht  (1.651 m) Wt 79.4 kg SpO2 94% BMI 29.12 kg/m BSA 1.91 m  Flowsheets:  NEWS, MEWS Score, Anthropometrics, Method of Visit   Encounter Info:  Billing Info, History, Allergies, Detailed Report    All Notes    Progress Notes by Lyn Records, MD at 06/24/2021 4:00 PM  Author: Lyn Records, MD Author Type: Physician Filed: 06/24/2021  5:22 PM  Note Status: Signed Cosign: Cosign Not Required Encounter Date: 06/24/2021  Editor: Lyn Records, MD (Physician)                Cardiology Office Note:     Date:  06/24/2021    ID:  [name], [name] 07/28/1946, MRN [id number]   PCP:  Burton Apley, MD    Cardiologist:  None    Referring MD: Anson Fret, MD        Chief Complaint  Patient presents with   Follow-up      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 female with a hx of recent CVA and concern for paradoxical embolus via PFO. Primary cardiology patient of Dr. Clifton James.     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  She has undergone 6 months of anticoagulation therapy.  She has been seen by neurology who is requesting that we rule out a mechanism for paradoxical embolization through either an ASD or patent foramen ovale.  In further discussing with the patient, she not infrequently has episodes of palpitation.  According to her they do not last long.  She has no history of atrial fibrillation.   The patient has a longstanding history of migraine-like headaches.       Past Medical History:  Diagnosis Date   Anxiety     Dysphagia     HYPERTHYROIDISM     LYMPHADENOPATHY     MIGRAINE HEADACHE     Neuromuscular  disorder (HCC)      condition where muscle separates from bone left chest, quarter size, pain intermittent           Past Surgical History:  Procedure Laterality Date   BALLOON DILATION N/A 09/18/2020    Procedure: BALLOON DILATION;  Surgeon: Kerin Salen, MD;  Location: [facility];  Service: Gastroenterology;  Laterality: N/A;   ESOPHAGOGASTRODUODENOSCOPY (EGD) WITH PROPOFOL N/A 09/18/2020    Procedure: ESOPHAGOGASTRODUODENOSCOPY (EGD) WITH PROPOFOL;  Surgeon: Kerin Salen, MD;  Location: [facility];  Service: Gastroenterology;  Laterality: N/A;   EYE SURGERY Bilateral      Cataract L eye 1-20, R eye 3-20    FOREIGN BODY REMOVAL   09/18/2020    Procedure: FOREIGN BODY REMOVAL;  Surgeon: Kerin Salen, MD;  Location: [facility];  Service: Gastroenterology;;   MOHS SURGERY   2001   REVERSE SHOULDER ARTHROPLASTY Right 03/16/2019    Procedure: REVERSE SHOULDER ARTHROPLASTY;  Surgeon: Beverely Low, MD;  Location: [facility];  Service: Orthopedics;  Laterality: Right;  interscalene block   TUBAL LIGATION          Current Medications:     Current Meds  Medication Sig   acetaminophen (TYLENOL) 500  MG tablet Take 1,000 mg by mouth every 6 (six) hours as needed for headache.   Calcium Carbonate+Vitamin D 600-200 MG-UNIT TABS Take 2 tablets by mouth 2 (two) times daily.   folic acid (FOLVITE) 1 MG tablet Take 2 tablets (2 mg total) by mouth daily.   gabapentin (NEURONTIN) 300 MG capsule TAKE 1 CAPSULE BY MOUTH IN  THE MORNING , 1 CAPSULE BY  MOUTH IN THE AFTERNOON ,  AND 2 CAPSULES BEFORE  BEDTIME (Patient taking differently: Take 300 mg by mouth 4 (four) times daily.)   HYDROcodone-acetaminophen (NORCO/VICODIN) 5-325 MG tablet Take 1 tablet by mouth. As needed   levothyroxine (SYNTHROID) 112 MCG tablet Take 112 mcg by mouth daily before breakfast.   lidocaine-prilocaine (EMLA) cream Apply to affected area once (Patient taking differently: Apply to affected area once PRN)   LORazepam (ATIVAN) 2  MG tablet Take 2 mg by mouth 3 (three) times daily as needed for anxiety.   methocarbamol (ROBAXIN) 750 MG tablet Take 750 mg by mouth 2 (two) times daily as needed.   Multiple Vitamins-Minerals (MULTIVITAMIN WITH MINERALS) tablet Take 1 tablet by mouth daily.   polycarbophil (FIBERCON) 625 MG tablet Take 1,250 mg by mouth daily.   polyethylene glycol (MIRALAX / GLYCOLAX) 17 g packet Take 17 g by mouth daily as needed for mild constipation.   polyvinyl alcohol (LIQUIFILM TEARS) 1.4 % ophthalmic solution Place 1 drop into both eyes as needed for dry eyes.   sertraline (ZOLOFT) 50 MG tablet Take 50 mg by mouth daily.      Allergies:   Oxycodone, Aspirin, Imipenem, Statins, Cephalexin, Clindamycin/lincomycin, Contrast media [iodinated diagnostic agents], Losartan potassium-hctz, Penicillins, and Quinolones    Social History         Socioeconomic History   Marital status: Married      Spouse name: Not on file   Number of children: Not on file   Years of education: Not on file   Highest education level: Not on file  Occupational History   Not on file  Tobacco Use   Smoking status: Former      Types: Cigarettes      Quit date: 1969      Years since quitting: 53.7   Smokeless tobacco: Never  Vaping Use   Vaping Use: Never used  Substance and Sexual Activity   Alcohol use: No   Drug use: No   Sexual activity: Not on file  Other Topics Concern   Not on file  Social History Narrative   Not on file    Social Determinants of Health    Financial Resource Strain: Not on file  Food Insecurity: Not on file  Transportation Needs: Not on file  Physical Activity: Not on file  Stress: Not on file  Social Connections: Not on file      Family History: The patient's family history includes Arthritis in her brother and sister; Diabetes in her brother and brother; Heart disease in her mother; Neuropathy in her brother and sister; Stroke in her father.   ROS:   Please see the history of  present illness.    Longstanding all other systems reviewed and are negative.   EKGs/Labs/Other Studies Reviewed:     The following studies were reviewed today:   2D Doppler echocardiogram 01/06/2021: IMPRESSIONS     1. Left ventricular ejection fraction, by estimation, is 60 to 65%. The  left ventricle has normal function. The left ventricle has no regional  wall motion abnormalities. Left ventricular diastolic  parameters are  consistent with Grade I diastolic  dysfunction (impaired relaxation).   2. McConnell's sign appears to be present.. Right ventricular systolic  function is mildly reduced. The right ventricular size is moderately  enlarged. There is moderately elevated pulmonary artery systolic pressure.   3. Right atrial size was moderately dilated.   4. The mitral valve is normal in structure. Mild mitral valve  regurgitation. No evidence of mitral stenosis.   5. Tricuspid valve regurgitation is moderate.   6. The aortic valve is tricuspid. Aortic valve regurgitation is not  visualized. No aortic stenosis is present.   7. The inferior vena cava is dilated in size with <50% respiratory  variability, suggesting right atrial pressure of 15 mmHg.   Comparison(s): Prior images unable to be directly viewed, comparison made  by report only. Findings suggest cor pulmonale, possibly acute. Consider  pulmonary embolism. Discussed with primary team.      EKG:  EKG not repeated   Recent Labs: 03/05/2021: TSH 0.902 04/29/2021: Magnesium 2.1 06/17/2021: ALT 15; BUN 7; Creatinine 0.65; Hemoglobin 13.4; Platelet Count 383; Potassium 3.9; Sodium 141  Recent Lipid Panel         Component Value Date/Time    CHOL 158 01/05/2021 0317    TRIG 70 01/05/2021 0317    HDL 44 01/05/2021 0317    CHOLHDL 3.6 01/05/2021 0317    VLDL 14 01/05/2021 0317    LDLCALC 100 (H) 01/05/2021 0317      Physical Exam:     VS:  BP 118/76   Pulse 97   Ht 5\' 5"  (1.651 m)   Wt 175 lb (79.4 kg)   SpO2  94%   BMI 29.12 kg/m         Wt Readings from Last 3 Encounters:  06/24/21 175 lb (79.4 kg)  06/17/21 174 lb (78.9 kg)  04/29/21 171 lb 6.4 oz (77.7 kg)      GEN: Overweight. No acute distress HEENT: Normal NECK: No JVD. LYMPHATICS: No lymphadenopathy CARDIAC: No murmur. RRR no gallop, or edema. VASCULAR:  Normal Pulses. No bruits. RESPIRATORY:  Clear to auscultation without rales, wheezing or rhonchi  ABDOMEN: Soft, non-tender, non-distended, No pulsatile mass, MUSCULOSKELETAL: No deformity  SKIN: Warm and dry NEUROLOGIC:  Alert and oriented x 3 PSYCHIATRIC:  Normal affect    ASSESSMENT:     1. Paradoxical embolus (HCC)   2. PVC (premature ventricular contraction)   3. 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 Pulmonary embolism without acute cor pulmonale, unspecified chronicity, unspecified pulmonary embolism type (HCC)     PLAN:     In order of problems listed above:   This is possible.  Transesophageal echo will be done to exclude patent foramen ovale and or small previously unrecognized ASD that could be the conduit for paradoxical embolization.  I will also order a 30-day monitor to exclude a more common source of multifocal CVA which is atrial fibrillation.  If there is a patent foramen or ASD, closure would be a consideration. Not discussed Attempting to determine the etiology. Anticoagulation therapy has been discontinued.         Medication Adjustments/Labs and Tests Ordered: Current medicines are reviewed at length with the patient today.  Concerns regarding medicines are outlined above.     Orders Placed This Encounter  Procedures   Basic metabolic panel   CBC   Cardiac event monitor      No orders of the defined types were placed  in this encounter.     Patient Instructions  Medication Instructions:  Your physician recommends that you continue on your current medications as directed. Please refer to the Current  Medication list given to you today.   *If you need a refill on your cardiac medications before your next appointment, please call your pharmacy*     Lab Work: BMET and CBC today   If you have labs (blood work) drawn today and your tests are completely normal, you will receive your results only by: MyChart Message (if you have MyChart) OR A paper copy in the mail If you have any lab test that is abnormal or we need to change your treatment, we will call you to review the results.     Testing/Procedures: Your physician has requested that you have a TEE. During a TEE, sound waves are used to create images of your heart. It provides your doctor with information about the size and shape of your heart and how well your heart's chambers and valves are working. In this test, a transducer is attached to the end of a flexible tube that's guided down your throat and into your esophagus (the tube leading from you mouth to your stomach) to get a more detailed image of your heart. You are not awake for the procedure. Please see the instruction sheet given to you today. For further information please visit [email or web address]   Your physician has recommended that you wear an event monitor. Event monitors are medical devices that record the heart's electrical activity. Doctors most often [place] these monitors to diagnose arrhythmias. Arrhythmias are problems with the speed or rhythm of the heartbeat. The monitor is a small, portable device. You can wear one while you do your normal daily activities. This is usually used to diagnose what is causing palpitations/syncope (passing out).     Follow-Up: At [facility], you and your health needs are our priority.  As part of our continuing mission to provide you with exceptional heart care, we have created designated Provider Care Teams.  These Care Teams include your primary Cardiologist (physician) and Advanced Practice Providers (APPs -  Physician Assistants and  Nurse Practitioners) who all work together to provide you with the care you need, when you need it.   We recommend signing up for the patient portal called "MyChart".  Sign up information is provided on this After Visit Summary.  MyChart is used to connect with patients for Virtual Visits (Telemedicine).  Patients are able to view lab/test results, encounter notes, upcoming appointments, etc.  Non-urgent messages can be sent to your provider as well.   To learn more about what you can do with MyChart, go to [email or web address].     Your next appointment:   As needed   The format for your next appointment:   In Person   Provider:   You may see Verdis Prime, MD or one of the following Advanced Practice Providers on your designated Care Team:   Nada Boozer, NP       Other Instructions   TEE INSTRUCTIONS: You are scheduled for a TEE on Friday, October 14th with Dr. Jens Som.  Please arrive at the [facility] (Main Entrance A) at [facility]: [address] [place], [place] [address] at 8 am. You are allowed ONE visitor in the waiting room during your procedure. Both you and your guest must wear masks.   DIET: Nothing to eat or drink after midnight except a sip of water with medications.  MEDICATION INSTRUCTIONS:   1) You may take your other medications as directed with sips of water.   LABS:  You had labs drawn today   You must have a responsible person to drive you home and stay in the waiting area during your procedure. Failure to do so could result in cancellation.   Bring your insurance cards.   *Special Note: Every effort is made to have your procedure done on time. Occasionally there are emergencies that occur at the hospital that may cause delays. Please be patient if a delay does occur.      [employer] Cardiac Event Monitor Instructions Your physician has requested you wear your cardiac event monitor for _____ days, (1-30). [employer] may call or text to  confirm a shipping address. The monitor will be sent to a land address via UPS. [employer] will not ship a monitor to a PO BOX. It typically takes 3-5 days to receive your monitor after it has been enrolled. [employer] will assist with USPS tracking if your package is delayed. The telephone number for [employer] is [phone]. Once you have received your monitor, please review the enclosed instructions. Instruction tutorials can also be viewed under help and settings on the enclosed cell phone. Your monitor has already been registered assigning a specific monitor serial # to you.   Applying the monitor Remove cell phone from case and turn it on. The cell phone works as IT consultant and needs to be within [employer] of you at all times. The cell phone will need to be charged on a daily basis. We recommend you plug the cell phone into the enclosed charger at your bedside table every night.   Monitor batteries: You will receive two monitor batteries labelled #1 and #2. These are your recorders. Plug battery #2 onto the second connection on the enclosed charger. Keep one battery on the charger at all times. This will keep the monitor battery deactivated. It will also keep it fully charged for when you need to switch your monitor batteries. A small light will be blinking on the battery emblem when it is charging. The light on the battery emblem will remain on when the battery is fully charged.   Open package of a Monitor strip. Insert battery #1 into black hood on strip and gently squeeze monitor battery onto connection as indicated in instruction booklet. Set aside while preparing skin.   Choose location for your strip, vertical or horizontal, as indicated in the instruction booklet. Shave to remove all hair from location. There cannot be any lotions, oils, powders, or colognes on skin where monitor is to be applied. Wipe skin clean with enclosed Saline wipe. Dry skin completely.   Peel  paper labeled #1 off the back of the Monitor strip exposing the adhesive. Place the monitor on the chest in the vertical or horizontal position shown in the instruction booklet. One arrow on the monitor strip must be pointing upward. Carefully remove paper labeled #2, attaching remainder of strip to your skin. Try not to create any folds or wrinkles in the strip as you apply it.   Firmly press and release the circle in the center of the monitor battery. You will hear a small beep. This is turning the monitor battery on. The heart emblem on the monitor battery will light up every 5 seconds if the monitor battery in turned on and connected to the patient securely. Do not push and hold the circle down as this turns the monitor battery off.  The cell phone will locate the monitor battery. A screen will appear on the cell phone checking the connection of your monitor strip. This may read poor connection initially but change to good connection within the next minute. Once your monitor accepts the connection you will hear a series of 3 beeps followed by a climbing crescendo of beeps. A screen will appear on the cell phone showing the two monitor strip placement options. Touch the picture that demonstrates where you applied the monitor strip.   Your monitor strip and battery are waterproof. You are able to shower, bathe, or swim with the monitor on. They just ask you do not submerge deeper than 3 feet underwater. We recommend removing the monitor if you are swimming in a lake, river, or ocean.   Your monitor battery will need to be switched to a fully charged monitor battery approximately once a week. The cell phone will alert you of an action which needs to be made.   On the cell phone, tap for details to reveal connection status, monitor battery status, and cell phone battery status. The green dots indicates your monitor is in good status. A red dot indicates there is something that needs your  attention.   To record a symptom, click the circle on the monitor battery. In 30-60 seconds a list of symptoms will appear on the cell phone. Select your symptom and tap save. Your monitor will record a sustained or significant arrhythmia regardless of you clicking the button. Some patients do not feel the heart rhythm irregularities. [employer] will notify us of any serious or critical events.   Refer to instruction booklet for instructions on switching batteries, changing strips, the Do not disturb or Pause features, or any additional questions.   Call [employer] at [phone], to confirm your monitor is transmitting and record your baseline. They will answer any questions you may have regarding the monitor instructions at that time.   Returning the monitor to [employer] Place all equipment back into blue box. Peel off strip of paper to expose adhesive and close box securely. There is a prepaid UPS shipping label on this box. Drop in a UPS drop box, or at a UPS facility like [employer]. You may also contact [employer] to arrange UPS to pick up monitor package at your home.    Signed, Lesleigh Noe, MD  06/24/2021 5:16 PM    [facility]      For TEE; no changes. Olga Millers

## 2021-07-03 NOTE — Anesthesia Postprocedure Evaluation (Signed)
Anesthesia Post Note  Patient: [name]  Procedure(s) Performed: TRANSESOPHAGEAL ECHOCARDIOGRAM (TEE) BUBBLE STUDY     Patient location during evaluation: PACU Anesthesia Type: MAC Level of consciousness: awake and alert Pain management: pain level controlled Vital Signs Assessment: post-procedure vital signs reviewed and stable Respiratory status: spontaneous breathing, nonlabored ventilation and respiratory function stable Cardiovascular status: blood pressure returned to baseline and stable Postop Assessment: no apparent nausea or vomiting Anesthetic complications: no   No notable events documented.  Last Vitals:  Vitals:   07/03/21 0721 07/03/21 0842  BP: (!) 150/75 (!) 122/57  Pulse: 73 64  Resp: 20 (!) 25  Temp: 36.6 C (!) 36.2 C  SpO2: 94% 94%    Last Pain:  Vitals:   07/03/21 0842  TempSrc: Temporal  PainSc: 0-No pain                 Lynda Rainwater

## 2021-07-03 NOTE — Anesthesia Preprocedure Evaluation (Signed)
Anesthesia Evaluation  Patient identified by MRN, date of birth, ID band Patient awake    Reviewed: Allergy & Precautions, NPO status , Patient's Chart, lab work & pertinent test results  History of Anesthesia Complications Negative for: history of anesthetic complications  Airway Mallampati: I  TM Distance: >3 FB Neck ROM: Full    Dental  (+) Teeth Intact   Pulmonary neg pulmonary ROS, former smoker,    Pulmonary exam normal        Cardiovascular negative cardio ROS Normal cardiovascular exam     Neuro/Psych  Headaches, Anxiety CVA    GI/Hepatic negative GI ROS, Neg liver ROS,   Endo/Other  Hypothyroidism   Renal/GU negative Renal ROS  negative genitourinary   Musculoskeletal negative musculoskeletal ROS (+)   Abdominal   Peds  Hematology negative hematology ROS (+)   Anesthesia Other Findings   Reproductive/Obstetrics                             Anesthesia Physical  Anesthesia Plan  ASA: 3  Anesthesia Plan: MAC   Post-op Pain Management:    Induction: Intravenous  PONV Risk Score and Plan: 2 and Ondansetron, Treatment may vary due to age or medical condition and Midazolam  Airway Management Planned: Nasal Cannula  Additional Equipment: None  Intra-op Plan:   Post-operative Plan:   Informed Consent: I have reviewed the patients History and Physical, chart, labs and discussed the procedure including the risks, benefits and alternatives for the proposed anesthesia with the patient or authorized representative who has indicated his/her understanding and acceptance.       Plan Discussed with:   Anesthesia Plan Comments:         Anesthesia Quick Evaluation

## 2021-07-03 NOTE — Transfer of Care (Signed)
Immediate Anesthesia Transfer of Care Note  Patient: NOEMIE DEVIVO  Procedure(s) Performed: TRANSESOPHAGEAL ECHOCARDIOGRAM (TEE) BUBBLE STUDY  Patient Location: Endoscopy Unit  Anesthesia Type:MAC  Level of Consciousness: awake and alert   Airway & Oxygen Therapy: Patient Spontanous Breathing and Patient connected to nasal cannula oxygen  Post-op Assessment: Report given to RN and Post -op Vital signs reviewed and stable  Post vital signs: Reviewed and stable  Last Vitals:  Vitals Value Taken Time  BP 122/57 07/03/21 0842  Temp    Pulse 63 07/03/21 0843  Resp 24 07/03/21 0843  SpO2 94 % 07/03/21 0843  Vitals shown include unvalidated device data.  Last Pain:  Vitals:   07/03/21 0842  TempSrc:   PainSc: 0-No pain         Complications: No notable events documented.

## 2021-07-03 NOTE — Discharge Instructions (Signed)

## 2021-07-03 NOTE — Telephone Encounter (Signed)
Spoke with pt and made her aware that I spoke with Dr. Katrinka Blazing.  He is going to review the TEE and send to Dr. Excell Seltzer for review to see if she needs closure or not.  Advised once they both look over everything and make a decision, someone will be in touch with an update.  Pt appreciative for call.

## 2021-07-03 NOTE — Interval H&P Note (Signed)
History and Physical Interval Note:  07/03/2021 7:31 AM  [name]  has presented today for surgery, with the diagnosis of PARADOXICAL EMBOLIS.  The various methods of treatment have been discussed with the patient and family. After consideration of risks, benefits and other options for treatment, the patient has consented to  Procedure(s): TRANSESOPHAGEAL ECHOCARDIOGRAM (TEE) (N/A) as a surgical intervention.  The patient's history has been reviewed, patient examined, no change in status, stable for surgery.  I have reviewed the patient's chart and labs.  Questions were answered to the patient's satisfaction.     Olga Millers

## 2021-07-03 NOTE — Telephone Encounter (Signed)
Pt had a TEE this morning and was advised she had a hole in her heart, pt would like a return call to discuss. Please advise pt further

## 2021-07-03 NOTE — Progress Notes (Signed)
  Echocardiogram Echocardiogram Transesophageal has been performed.  Gerda Diss 07/03/2021, 9:44 AM

## 2021-07-03 NOTE — Anesthesia Procedure Notes (Signed)
Procedure Name: MAC Date/Time: 07/03/2021 8:10 AM Performed by: Wilburn Cornelia, CRNA Pre-anesthesia Checklist: Patient identified, Emergency Drugs available, Suction available, Patient being monitored and Timeout performed Patient Re-evaluated:Patient Re-evaluated prior to induction Oxygen Delivery Method: Nasal cannula Placement Confirmation: breath sounds checked- equal and bilateral and positive ETCO2 Dental Injury: Teeth and Oropharynx as per pre-operative assessment

## 2021-07-05 ENCOUNTER — Encounter (HOSPITAL_COMMUNITY): Payer: Self-pay | Admitting: Cardiology

## 2021-07-06 ENCOUNTER — Telehealth: Payer: Self-pay

## 2021-07-06 ENCOUNTER — Other Ambulatory Visit: Payer: Self-pay | Admitting: Neurology

## 2021-07-06 DIAGNOSIS — I6310 Cerebral infarction due to embolism of unspecified precerebral artery: Secondary | ICD-10-CM

## 2021-07-06 DIAGNOSIS — Q2112 Patent foramen ovale: Secondary | ICD-10-CM

## 2021-07-06 NOTE — Telephone Encounter (Signed)
-----   Message from Anson Fret, MD sent at 07/06/2021 11:13 AM EDT ----- Plese let patient know they ofund a Patent Foramen Ovale (PFO) in her heart. It is small but I think she should still be evaluated with Dr. Tonny Bollman in order to decide whether to close it or not. I will place referral, please let her know they will call for appointment.

## 2021-07-06 NOTE — Telephone Encounter (Signed)
I called patient. I discussed the TEE results with her. Cardiology has already been in touch with her and she is waiting on a call back from Dr. Earmon [place] office about whether closure on the PFO is recommended or not. Pt verbalized understanding of results. Pt had no questions at this time but was encouraged to call back if questions arise.

## 2021-07-07 NOTE — Telephone Encounter (Signed)
Please refer to Dr. Excell Seltzer for PFO closure.  ----- Message -----  From: Julio Sicks, RN  Sent: 07/03/2021   1:59 PM EDT  To: Lyn Records, MD, Julio Sicks, RN    Spoke with pt and made her aware of recommendations.  Pt agreeable to seeing Dr. Excell Seltzer to discuss PFO.

## 2021-07-07 NOTE — Telephone Encounter (Signed)
Left message to call back  

## 2021-07-07 NOTE — Telephone Encounter (Signed)
Scheduled the patient for PFO consult with Dr. Excell Seltzer on 08/18/2021. She was grateful for call and agrees with plan. Per her request, will call and reschedule if a sooner appointment becomes available.

## 2021-07-28 ENCOUNTER — Ambulatory Visit
Admission: RE | Admit: 2021-07-28 | Discharge: 2021-07-28 | Disposition: A | Discharge status: Home / Self Care | Payer: Medicare Other | Source: Ambulatory Visit | Attending: Internal Medicine | Admitting: Internal Medicine

## 2021-07-28 ENCOUNTER — Encounter (HOSPITAL_COMMUNITY): Payer: Self-pay | Admitting: Hematology & Oncology

## 2021-07-28 ENCOUNTER — Other Ambulatory Visit: Payer: Self-pay | Admitting: Internal Medicine

## 2021-07-28 DIAGNOSIS — R059 Cough, unspecified: Secondary | ICD-10-CM

## 2021-07-28 NOTE — Telephone Encounter (Signed)
Rescheduled the patient to 08/04/2021 with Dr. Excell Seltzer. She was grateful for call and agreed with plan.

## 2021-07-29 ENCOUNTER — Telehealth: Payer: Self-pay | Admitting: Neurology

## 2021-07-29 ENCOUNTER — Ambulatory Visit: Payer: Medicare Other | Admitting: Neurology

## 2021-07-29 ENCOUNTER — Other Ambulatory Visit: Payer: Self-pay

## 2021-07-29 ENCOUNTER — Encounter: Payer: Self-pay | Admitting: Neurology

## 2021-07-29 VITALS — BP 140/82 | HR 68 | Ht 65.0 in | Wt 175.8 lb

## 2021-07-29 DIAGNOSIS — Q2112 Patent foramen ovale: Secondary | ICD-10-CM | POA: Diagnosis not present

## 2021-07-29 DIAGNOSIS — H539 Unspecified visual disturbance: Secondary | ICD-10-CM

## 2021-07-29 DIAGNOSIS — I639 Cerebral infarction, unspecified: Secondary | ICD-10-CM | POA: Diagnosis not present

## 2021-07-29 DIAGNOSIS — I6310 Cerebral infarction due to embolism of unspecified precerebral artery: Secondary | ICD-10-CM

## 2021-07-29 DIAGNOSIS — R51 Headache with orthostatic component, not elsewhere classified: Secondary | ICD-10-CM

## 2021-07-29 DIAGNOSIS — G4719 Other hypersomnia: Secondary | ICD-10-CM

## 2021-07-29 DIAGNOSIS — R519 Headache, unspecified: Secondary | ICD-10-CM

## 2021-07-29 DIAGNOSIS — R0683 Snoring: Secondary | ICD-10-CM

## 2021-07-29 NOTE — Progress Notes (Signed)
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 Wearing the heart monitor, hs a follow up with Dr. Burt Knack next week to discuss pfo. She is wearing a heart monitor. She has been having headaches, they are migraines, she has a hx of migraines, she gets very upset and she cries, didn't happen after the hospital all of a sudden she gets very anxious and depressed and then she cries and gets a headache and is stressed out. She has a little one when she wakes in the morning. She is excessively fatigued, she takes a tyenol that helps, she has daily morning headaches and husband states she snores heavily. Hx of embolic strokes, needs sleep study and repeat MRi brain  Patient complains of symptoms per HPI as well as the following symptoms: stress . Pertinent negatives and positives per HPI. 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  tPA at that time was not appropriate due to low hemoglobin and suspected hemolytic anemia.  Lower extremity venous Doppler showed a DVT and a 2D echo suggested a PE.  Due to location of stroke, patient also had quadrantanopia which has significantly improved, in fact  patient states she is feeling well.    MRI brain 01/05/2021: Acute infarcts of the right parietotemporal lobes greater than left frontal lobe. Additional  punctate left parietal and right occipital acute infarcts.   Mild chronic microvascular ischemic changes. Few chronic microhemorrhages.. personally reviewed and agree with finsings  MRA: No intracranial large vessel occlusion or proximal high-grade arterial stenosis.  Echocardiogram: IMPRESSIONS     1. Left ventricular ejection fraction, by estimation, is 60 to 65%. The  left ventricle has normal function. The left ventricle has no regional  wall motion abnormalities. Left ventricular diastolic parameters are  consistent with Grade I diastolic  dysfunction (impaired relaxation).   2. McConnell's sign appears to be present.. Right ventricular systolic  function is mildly reduced. The right ventricular size is moderately  enlarged. There is moderately elevated pulmonary artery systolic pressure.   3. Right atrial size was moderately dilated.   4. The mitral valve is normal in structure. Mild mitral valve  regurgitation. No evidence of mitral stenosis.   5. Tricuspid valve regurgitation is moderate.   6. The aortic valve is tricuspid. Aortic valve regurgitation is not  visualized. No aortic stenosis is present.   7. The inferior vena cava is dilated in size with <50% respiratory  variability, suggesting right atrial pressure of 15 mmHg.    Carotid Ultrasounds: Summary:  Right Carotid: The extracranial vessels were near-normal with only minimal  wall                 thickening or plaque.   Left Carotid: The extracranial vessels were near-normal with only minimal  wall  thickening or plaque.   Vertebrals:  Bilateral vertebral arteries demonstrate antegrade flow.  Subclavians: Normal flow hemodynamics were seen in bilateral subclavian       HPI neuropathy 08/21/2020:  [name] is a 75 y.o. female here as requested by Lorene Dy, MD for aching and cold feet, past medical history of distal first metatarsal osteotomy to repair hallux valgus left with screw  fixation in the same procedure on the right both in 2019, migraines, hyperthyroidism, I reviewed Dr. Barkley Bruns notes which reported palpable dorsalis pedis and posterior tibialis pulses in both feet, normal temperature gradient from ankle to toes, normal color and turgor, digital capillary refill time normal, subjective paresthesias and tingling, normal sharp and dull sensation is noted, decreased vibratory sensation in both feet, Achilles tendon reflexes diminished, Tinel's sign is negative at the tarsal tunnel. No other information available since podiatry referred we do not have their primary care note, would appreciate it if podiatry defers to primary care to refer to specialists as they deem clinically appropriate.  Started 3 years ago. With husband who provides information. About a year ago she saw her arthritis doctor and she was having problems with her feet and she was told she has arthritis and she saw a foot doctor. It is in the top of the feet and comes up the legs. She wakes early mornings and she has cramps in the feet and sometimes all the legs hurt. She has back pain but no radicular symptoms, she has a lot of cramps, she tries to drink water. No pre-diabetes, no diabetes, no autoimmune disease, no chemotherapy, brother has parkinson's, sister has neuropathy without a diagnosis, she does not know if parents had neuropathy. Numbness and tingling and itching and it comes all the way up to the thighs, inparticular on the top of the right foot. At night both her legs hurt all the way to the thighs not in a particular dermatome. The legs ache but the feet are more tingling, numb, itchy, cold, Gabapentin helps.   Reviewed notes, labs and imaging from outside physicians, which showed: see above  Review of Systems: Patient complains of symptoms per HPI as well as the following symptoms: headache . Pertinent negatives and positives per HPI. All others negative    Social History   Socioeconomic  History   Marital status: Married    Spouse name: Not on file   Number of children: Not on file   Years of education: Not on file   Highest education level: Not on file  Occupational History   Not on file  Tobacco Use   Smoking status: Former    Types: Cigarettes    Quit date: 1969    Years since quitting: 53.8   Smokeless tobacco: Never  Vaping Use   Vaping Use: Never used  Substance and Sexual Activity   Alcohol use: No   Drug use: No   Sexual activity: Not on file  Other Topics Concern   Not on file  Social History Narrative   Not on file   Social Determinants of Health   Financial Resource Strain: Not on file  Food Insecurity: Not on file  Transportation Needs: Not on file  Physical Activity: Not on file  Stress: Not on file  Social Connections: Not on file  Intimate Partner Violence: Not on file    Family History  Problem Relation Age of Onset   Heart disease Mother    Stroke Father    Arthritis Sister  Diabetes Brother    Arthritis Brother    Neuropathy Brother    Neuropathy Sister    Diabetes Brother     Past Medical History:  Diagnosis Date   Anxiety    Dysphagia    HYPERTHYROIDISM    LYMPHADENOPATHY    MIGRAINE HEADACHE    Neuromuscular disorder ([place])    condition where muscle separates from bone left chest, quarter size, pain intermittent    Patient Active Problem List   Diagnosis Date Noted   PFO (patent foramen ovale) 07/29/2021   Symptomatic anemia 02/19/2021   DVT (deep venous thrombosis) ([place]) 01/12/2021   Pulmonary embolism ([place]) 01/12/2021   Hypothyroidism 01/12/2021   Anxiety 01/12/2021   Cerebral thrombosis with cerebral infarction 01/06/2021   Cerebral embolism with cerebral infarction 01/06/2021   Hemolytic anemia ([place]) 01/03/2021   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   Polyneuropathy 08/22/2020   S/P shoulder replacement, 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/2019   PVC (premature ventricular contraction) 07/11/2013    Dysphagia    HYPERTHYROIDISM 07/17/2010   MIGRAINE HEADACHE 07/17/2010   LYMPHADENOPATHY 07/17/2010    Past Surgical History:  Procedure Laterality Date   BALLOON DILATION N/A 09/18/2020   Procedure: BALLOON DILATION;  Surgeon: Ronnette Juniper, MD;  Location: [facility] ENDOSCOPY;  Service: Gastroenterology;  Laterality: N/A;   BUBBLE STUDY  07/03/2021   Procedure: BUBBLE STUDY;  Surgeon: Lelon Perla, MD;  Location: [facility] ENDOSCOPY;  Service: Cardiovascular;;   ESOPHAGOGASTRODUODENOSCOPY (EGD) WITH PROPOFOL N/A 09/18/2020   Procedure: ESOPHAGOGASTRODUODENOSCOPY (EGD) WITH PROPOFOL;  Surgeon: Ronnette Juniper, MD;  Location: [facility] ENDOSCOPY;  Service: Gastroenterology;  Laterality: N/A;   EYE SURGERY Bilateral    Cataract L eye 1-20, R eye 3-20    FOREIGN BODY REMOVAL  09/18/2020   Procedure: FOREIGN BODY REMOVAL;  Surgeon: Ronnette Juniper, MD;  Location: [facility];  Service: Gastroenterology;;   [place] ARTHROPLASTY Right 03/16/2019   Procedure: REVERSE SHOULDER ARTHROPLASTY;  Surgeon: Netta Cedars, MD;  Location: [facility] ORS;  Service: Orthopedics;  Laterality: Right;  interscalene block   TEE WITHOUT CARDIOVERSION N/A 07/03/2021   Procedure: TRANSESOPHAGEAL ECHOCARDIOGRAM (TEE);  Surgeon: Lelon Perla, MD;  Location: [facility] ENDOSCOPY;  Service: Cardiovascular;  Laterality: N/A;   TUBAL LIGATION      Current Outpatient Medications  Medication Sig Dispense Refill   acetaminophen (TYLENOL) 500 MG tablet Take 1,000 mg by mouth every 4 (four) hours as needed for headache.     Calcium Carbonate+Vitamin D 600-200 MG-UNIT TABS Take 1 tablet by mouth 2 (two) times daily.     folic acid (FOLVITE) 1 MG tablet Take 2 tablets (2 mg total) by mouth daily. (Patient taking differently: Take 1 mg by mouth in the morning and at bedtime.)     gabapentin (NEURONTIN) 300 MG capsule TAKE 1 CAPSULE BY MOUTH IN  THE MORNING , 1 CAPSULE BY  MOUTH IN THE AFTERNOON ,  AND 2 CAPSULES BEFORE  BEDTIME  (Patient taking differently: Take 300 mg by mouth 4 (four) times daily.) 360 capsule 0   HYDROcodone-acetaminophen (NORCO/VICODIN) 5-325 MG tablet Take 1 tablet by mouth every 4 (four) hours as needed for severe pain.     levothyroxine (SYNTHROID) 112 MCG tablet Take 112 mcg by mouth daily before breakfast.     lidocaine-prilocaine (EMLA) cream Apply to affected area once (Patient taking differently: Apply to affected area once PRN) 30 g 3   LORazepam (ATIVAN) 2 MG tablet Take 2 mg by mouth 3 (three) times daily  as needed for anxiety.     methocarbamol (ROBAXIN) 750 MG tablet Take 750 mg by mouth 2 (two) times daily as needed for muscle spasms.     Multiple Vitamins-Minerals (MULTIVITAMIN WITH MINERALS) tablet Take 1 tablet by mouth daily.     nystatin (MYCOSTATIN/NYSTOP) powder Apply 1 application topically 3 (three) times daily as needed (irritation).     polycarbophil (FIBERCON) 625 MG tablet Take 625 mg by mouth in the morning and at bedtime.     polyvinyl alcohol (LIQUIFILM TEARS) 1.4 % ophthalmic solution Place 1 drop into both eyes as needed for dry eyes.     sertraline (ZOLOFT) 50 MG tablet Take 50 mg by mouth daily.     No current facility-administered medications for this visit.    Allergies as of 07/29/2021 - Review Complete 07/29/2021  Allergen Reaction Noted   Oxycodone Other (See Comments) 03/06/2019   Aspirin Other (See Comments) 03/06/2019   Imipenem Other (See Comments) 03/06/2019   Statins Other (See Comments) 04/29/2021   Cephalexin Other (See Comments) 03/06/2019   Clindamycin/lincomycin Nausea And Vomiting and Rash 03/06/2019   Contrast media [iodinated diagnostic agents] Rash 09/23/2020   Losartan potassium-hctz Other (See Comments) 11/06/2020   Penicillins Rash 04/29/2021   Quinolones Rash     Vitals: BP 140/82   Pulse 68   Ht 5' 5" (1.651 m)   Wt 175 lb 12.8 oz (79.7 kg)   BMI 29.25 kg/m  Last Weight:  Wt Readings from Last 1 Encounters:  07/29/21 175 lb  12.8 oz (79.7 kg)   Last Height:   Ht Readings from Last 1 Encounters:  07/29/21 5' 5" (1.651 m)    Physical exam: Exam: General: tearful today                  CV: RRR, no MRG. No Carotid Bruits. No peripheral edema, warm, nontender Eyes: Conjunctivae clear without exudates or hemorrhage  Neuro: stable Detailed Neurologic Exam  Speech:    Speech is normal; fluent and spontaneous with normal comprehension.  Cognition:    The patient is oriented to person, place, and time;     recent and remote memory intact;     language fluent;     normal attention, concentration,     fund of knowledge Cranial Nerves:    The pupils are equal, round, and reactive to light.pupils too small to visualize fundi. Visual fields show slight impairment homonymous right quadrantanopia. Extraocular movements are intact. Trigeminal sensation is intact and the muscles of mastication are normal. The face is symmetric. The palate elevates in the midline. Hearing intact. Voice is normal. Shoulder shrug is normal. The tongue has normal motion without fasciculations.   Coordination: No dysmetria or ataxia noted  Gait:    normal.   Motor Observation:    No asymmetry, no atrophy, and no involuntary movements noted. Tone:    Normal muscle tone.    Posture:    Posture is normal. normal erect    Strength:    Strength is V/V in the upper and lower limbs.      Sensation: intact to LT     Reflex Exam:  DTR's:    Deep tendon reflexes in the upper and lower extremities are symmetrical bilaterally.   Toes:    The toes are downgoing bilaterally.   Clonus:    Clonus is absent.    Assessment/Plan:  Patient is here for follow-up of stroke in April, she presented to 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  tPA at that time was not appropriate due to low hemoglobin and suspected hemolytic anemia.  Lower extremity venous Doppler showed a DVT and a 2D echo suggested a PE.  Due to location of stroke, patient also had quadrantanopia which has significantly improved, in fact  patient states she is feeling well but she is a little emotional in the offcie today  -I discussed patient's hospital course and findings with her and her husband, answered all questions, her neuro exam is quite good with just some mild residual visual changes, and she is feeling very well.  Etiology was uncertain but thought to be related to hemolytic anemia versus paradoxical emboli with PE and DVT if a PFO is present(small pfo found)  - She had TEE and has a small pfo could be the cause of her embolic stroke in the setting of DVT/PE.  Has follow up with Dr. Burt Knack next week.  -Wearing the heart monitor, hs a follow up with Dr. Burt Knack next week to d discuss pfo. I reached out to Dr. Burt Knack to see if she should send it in early to get read prior to appointment (wearing it about 3 weeks now)  - SLEEP EVALUATION: She has a daily morning morning. She is excessively fatigued she has daily morning headaches and husband states she snores heavily. Hx of embolic strokes, needs sleep study and repeat MRi brain. Dr. Brett Fairy treats her husband for dementia, will send to her.   -She has been having headaches, they are migraines, she has a hx of migraines, she gets very upset and she cries, didn't happen after the hospital all of a sudden she gets very anxious and depressed and then she cries and gets a headache and is stressed out. Will repeat MRI brain and get a sleep evaluation. Reasured her she is dealing with a lot, her husband has dementia. She had embolic strokes and now a pfo and  wearing a heart monitor.    Orders Placed This Encounter  Procedures   MR [place]   Ambulatory referral to Sleep Studies      Cc: Lorene Dy, MD,  Lorene Dy, MD, Dr. Burney Gauze, Dr. Redgie Grayer, MD  [facility] [address] [place], [place] [address]  Phone [phone] Fax [phone]  I spent over 40 minutes of face-to-face and non-face-to-face time with patient on the  1. PFO (patent foramen ovale)   2. Morning headache   3. Cryptogenic stroke ([place])   4. Excessive daytime sleepiness   5. Snoring   6. Positional headache   7. Vision changes   8. Worsening headaches   9. 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  This included previsit chart review, lab review, study review, order entry, electronic health record documentation, patient education on the different diagnostic and therapeutic options, counseling and coordination of care, risks and benefits of management, compliance, or risk factor reduction

## 2021-07-29 NOTE — Telephone Encounter (Signed)
[employer] medicare order sent to [facility], [facility] they will reach out to the patient to schedule.  

## 2021-07-29 NOTE — Patient Instructions (Signed)
MRI brain Dr. Brett Fairy for appointment and then likely sleep study  Sleep Apnea Sleep apnea is a condition in which breathing pauses or becomes shallow during sleep. People with sleep apnea usually snore loudly. They may have times when they gasp and stop breathing for 10 seconds or more during sleep. This may happen many times during the night. Sleep apnea disrupts your sleep and keeps your body from getting the rest that it needs. This condition can increase your risk of certain health problems, including: Heart attack. Stroke. Obesity. Type 2 diabetes. Heart failure. Irregular heartbeat. High blood pressure. The goal of treatment is to help you breathe normally again. What are the causes? The most common cause of sleep apnea is a collapsed or blocked airway. There are three kinds of sleep apnea: Obstructive sleep apnea. This kind is caused by a blocked or collapsed airway. Central sleep apnea. This kind happens when the part of the brain that controls breathing does not send the correct signals to the muscles that control breathing. Mixed sleep apnea. This is a combination of obstructive and central sleep apnea. What increases the risk? You are more likely to develop this condition if you: Are overweight. Smoke. Have a smaller than normal airway. Are older. Are female. Drink alcohol. Take sedatives or tranquilizers. Have a family history of sleep apnea. Have a tongue or tonsils that are larger than normal. What are the signs or symptoms? Symptoms of this condition include: Trouble staying asleep. Loud snoring. Morning headaches. Waking up gasping. Dry mouth or sore throat in the morning. Daytime sleepiness and tiredness. If you have daytime fatigue because of sleep apnea, you may be more likely to have: Trouble concentrating. Forgetfulness. Irritability or mood swings. Personality changes. Feelings of depression. Sexual dysfunction. This may include loss of interest if  you are female, or erectile dysfunction if you are female. How is this diagnosed? This condition may be diagnosed with: A medical history. A physical exam. A series of tests that are done while you are sleeping (sleep study). These tests are usually done in a sleep lab, but they may also be done at home. How is this treated? Treatment for this condition aims to restore normal breathing and to ease symptoms during sleep. It may involve managing health issues that can affect breathing, such as high blood pressure or obesity. Treatment may include: Sleeping on your side. Using a decongestant if you have nasal congestion. Avoiding the use of depressants, including alcohol, sedatives, and narcotics. Losing weight if you are overweight. Making changes to your diet. Quitting smoking. Using a device to open your airway while you sleep, such as: An oral appliance. This is a custom-made mouthpiece that shifts your lower jaw forward. A continuous positive airway pressure (CPAP) device. This device blows air through a mask when you breathe out (exhale). A nasal expiratory positive airway pressure (EPAP) device. This device has valves that you put into each nostril. A bi-level positive airway pressure (BIPAP) device. This device blows air through a mask when you breathe in (inhale) and breathe out (exhale). Having surgery if other treatments do not work. During surgery, excess tissue is removed to create a wider airway. Follow these instructions at home: Lifestyle Make any lifestyle changes that your health care provider recommends. Eat a healthy, well-balanced diet. Take steps to lose weight if you are overweight. Avoid using depressants, including alcohol, sedatives, and narcotics. Do not use any products that contain nicotine or tobacco. These products include cigarettes, chewing tobacco, and  vaping devices, such as e-cigarettes. If you need help quitting, ask your health care provider. General  instructions Take over-the-counter and prescription medicines only as told by your health care provider. If you were given a device to open your airway while you sleep, use it only as told by your health care provider. If you are having surgery, make sure to tell your health care provider you have sleep apnea. You may need to bring your device with you. Keep all follow-up visits. This is important. Contact a health care provider if: The device that you received to open your airway during sleep is uncomfortable or does not seem to be working. Your symptoms do not improve. Your symptoms get worse. Get help right away if: You develop: Chest pain. Shortness of breath. Discomfort in your back, arms, or stomach. You have: Trouble speaking. Weakness on one side of your body. Drooping in your face. These symptoms may represent a serious problem that is an emergency. Do not wait to see if the symptoms will go away. Get medical help right away. Call your local emergency services (911 in the [place] Do not drive yourself to the hospital. Summary Sleep apnea is a condition in which breathing pauses or becomes shallow during sleep. The most common cause is a collapsed or blocked airway. The goal of treatment is to restore normal breathing and to ease symptoms during sleep. This information is not intended to replace advice given to you by your health care provider. Make sure you discuss any questions you have with your health care provider. Document Revised: 04/15/2021 Document Reviewed: 08/15/2020 Elsevier Patient Education  2022 [employer].

## 2021-08-04 ENCOUNTER — Ambulatory Visit: Payer: Medicare Other | Admitting: Cardiovascular Disease

## 2021-08-04 ENCOUNTER — Encounter: Payer: Self-pay | Admitting: Cardiovascular Disease

## 2021-08-04 ENCOUNTER — Other Ambulatory Visit: Payer: Self-pay

## 2021-08-04 VITALS — BP 122/76 | HR 73 | Ht 65.0 in | Wt 177.4 lb

## 2021-08-04 DIAGNOSIS — Q2112 Patent foramen ovale: Secondary | ICD-10-CM

## 2021-08-04 LAB — BASIC METABOLIC PANEL
BUN/Creatinine Ratio: 14 (ref 12–28)
BUN: 8 mg/dL (ref 8–27)
CO2: 22 mmol/L (ref 20–29)
Calcium: 10.2 mg/dL (ref 8.7–10.3)
Chloride: 103 mmol/L (ref 96–106)
Creatinine, Ser: 0.59 mg/dL (ref 0.57–1.00)
Glucose: 98 mg/dL (ref 70–99)
Potassium: 4.6 mmol/L (ref 3.5–5.2)
Sodium: 141 mmol/L (ref 134–144)
eGFR: 95 mL/min/{1.73_m2} (ref >59)

## 2021-08-04 LAB — CBC
Hematocrit: 42.9 % (ref 34.0–46.6)
Hemoglobin: 14.4 g/dL (ref 11.1–15.9)
MCH: 31.4 pg (ref 26.6–33.0)
MCHC: 33.6 g/dL (ref 31.5–35.7)
MCV: 94 fL (ref 79–97)
Platelets: 411 10*3/uL (ref 150–450)
RBC: 4.59 x10E6/uL (ref 3.77–5.28)
RDW: 11.7 % (ref 11.7–15.4)
WBC: 7 10*3/uL (ref 3.4–10.8)

## 2021-08-04 MED ORDER — CLOPIDOGREL BISULFATE 75 MG PO TABS
75.0000 mg | ORAL_TABLET | Freq: Every day | ORAL | 1 refills | Status: DC
Start: 2021-08-04 — End: 2023-01-10

## 2021-08-04 NOTE — Progress Notes (Signed)
Cardiology Office Note:    Date:  08/04/2021   ID:  [name], [name] [birth year], MRN [id number]  PCP:  Burton Apley, MD   [facility] Providers Cardiologist:  None     Referring MD: Lyn Records, MD   Chief Complaint  Patient presents with   PFO     History of Present Illness:    [name] is a 75 y.o. female presenting for evaluation of PFO.  In April 2022 the patient was hospitalized with generalized weakness and she was found to have leukocytosis and severe anemia with hemoglobin 5.7.  She was ultimately diagnosed with autoimmune hemolytic anemia.  During that hospitalization, she became acutely aphasic and dysarthric.  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  She was also found to have lower extremity DVT and a 2D echocardiogram suggested pulmonary embolus as well (McConnell sign).  She was not found to have any significant atherosclerotic disease.  The patient is here with her husband today.  She was treated with steroids and rituximab for autoimmune hemolytic anemia which is now in remission.  She has been released by hematology and is following with her primary care physician.  She has some residual symptoms of headache, word finding difficulty at times, and right-sided numbness.  Overall she has improved significantly since her stroke and is not having any cardiovascular symptoms other than occasional heart palpitations which she relates to anxiety.  She has worn a 30-day event monitor and has mailed back the monitor, currently waiting processing and interpretation.  She denies chest pain, chest pressure, leg swelling, orthopnea, PND, syncope, or shortness of breath.  Past Medical History:  Diagnosis Date   Anxiety    Dysphagia    HYPERTHYROIDISM    LYMPHADENOPATHY    MIGRAINE HEADACHE    Neuromuscular disorder (HCC)    condition where muscle separates from bone left chest, quarter size, pain intermittent    Past Surgical History:  Procedure  Laterality Date   BALLOON DILATION N/A 09/18/2020   Procedure: BALLOON DILATION;  Surgeon: Kerin Salen, MD;  Location: [facility] ENDOSCOPY;  Service: Gastroenterology;  Laterality: N/A;   BUBBLE STUDY  07/03/2021   Procedure: BUBBLE STUDY;  Surgeon: Lewayne Bunting, MD;  Location: [facility] ENDOSCOPY;  Service: Cardiovascular;;   ESOPHAGOGASTRODUODENOSCOPY (EGD) WITH PROPOFOL N/A 09/18/2020   Procedure: ESOPHAGOGASTRODUODENOSCOPY (EGD) WITH PROPOFOL;  Surgeon: Kerin Salen, MD;  Location: [facility] ENDOSCOPY;  Service: Gastroenterology;  Laterality: N/A;   EYE SURGERY Bilateral    Cataract L eye 1-20, R eye 3-20    FOREIGN BODY REMOVAL  09/18/2020   Procedure: FOREIGN BODY REMOVAL;  Surgeon: Kerin Salen, MD;  Location: [facility] ENDOSCOPY;  Service: Gastroenterology;;   MOHS SURGERY  2001   REVERSE SHOULDER ARTHROPLASTY Right 03/16/2019   Procedure: REVERSE SHOULDER ARTHROPLASTY;  Surgeon: Beverely Low, MD;  Location: [facility] ORS;  Service: Orthopedics;  Laterality: Right;  interscalene block   TEE WITHOUT CARDIOVERSION N/A 07/03/2021   Procedure: TRANSESOPHAGEAL ECHOCARDIOGRAM (TEE);  Surgeon: Lewayne Bunting, MD;  Location: [facility] ENDOSCOPY;  Service: Cardiovascular;  Laterality: N/A;   TUBAL LIGATION      Current Medications: Current Meds  Medication Sig   acetaminophen (TYLENOL) 500 MG tablet Take 1,000 mg by mouth every 4 (four) hours as needed for headache.   Calcium Carbonate+Vitamin D 600-200 MG-UNIT TABS Take 1 tablet by mouth 2 (two) times daily.   clopidogrel (PLAVIX) 75 MG tablet Take 1 tablet (75 mg total) by mouth daily. Start 5 days  before PFO closure procedure   folic acid (FOLVITE) 1 MG tablet Take 2 tablets (2 mg total) by mouth daily. (Patient taking differently: Take 1 mg by mouth in the morning and at bedtime.)   gabapentin (NEURONTIN) 300 MG capsule TAKE 1 CAPSULE BY MOUTH IN  THE MORNING , 1 CAPSULE BY  MOUTH IN THE AFTERNOON ,  AND 2 CAPSULES BEFORE  BEDTIME (Patient taking differently: Take 300 mg  by mouth 4 (four) times daily.)   HYDROcodone-acetaminophen (NORCO/VICODIN) 5-325 MG tablet Take 1 tablet by mouth every 4 (four) hours as needed for severe pain.   levothyroxine (SYNTHROID) 112 MCG tablet Take 112 mcg by mouth daily before breakfast.   lidocaine-prilocaine (EMLA) cream Apply to affected area once (Patient taking differently: Apply to affected area once PRN)   LORazepam (ATIVAN) 2 MG tablet Take 2 mg by mouth 3 (three) times daily as needed for anxiety.   methocarbamol (ROBAXIN) 750 MG tablet Take 750 mg by mouth 2 (two) times daily as needed for muscle spasms.   Multiple Vitamins-Minerals (MULTIVITAMIN WITH MINERALS) tablet Take 1 tablet by mouth daily.   nystatin (MYCOSTATIN/NYSTOP) powder Apply 1 application topically 3 (three) times daily as needed (irritation).   polycarbophil (FIBERCON) 625 MG tablet Take 625 mg by mouth in the morning and at bedtime.   polyvinyl alcohol (LIQUIFILM TEARS) 1.4 % ophthalmic solution Place 1 drop into both eyes as needed for dry eyes.   sertraline (ZOLOFT) 50 MG tablet Take 50 mg by mouth daily.     Allergies:   Oxycodone, Aspirin, Imipenem, Statins, Cephalexin, Clindamycin/lincomycin, Contrast media [iodinated diagnostic agents], Losartan potassium-hctz, Penicillins, and Quinolones   Social History   Socioeconomic History   Marital status: Married    Spouse name: Not on file   Number of children: Not on file   Years of education: Not on file   Highest education level: Not on file  Occupational History   Not on file  Tobacco Use   Smoking status: Former    Types: Cigarettes    Quit date: 1969    Years since quitting: 53.9   Smokeless tobacco: Never  Vaping Use   Vaping Use: Never used  Substance and Sexual Activity   Alcohol use: No   Drug use: No   Sexual activity: Not on file  Other Topics Concern   Not on file  Social History Narrative   Not on file   Social Determinants of Health   Financial Resource Strain: Not on  file  Food Insecurity: Not on file  Transportation Needs: Not on file  Physical Activity: Not on file  Stress: Not on file  Social Connections: Not on file     Family History: The patient's family history includes Arthritis in her brother and sister; Diabetes in her brother and brother; Heart disease in her mother; Neuropathy in her brother and sister; Stroke in her father.  ROS:   Please see the history of present illness.    All other systems reviewed and are negative.  EKGs/Labs/Other Studies Reviewed:    The following studies were reviewed today: TEE:  1. Left ventricular ejection fraction, by estimation, is 55 to 60%. The  left ventricle has normal function. The left ventricle has no regional  wall motion abnormalities.   2. Right ventricular systolic function is normal. The right ventricular  size is normal.   3. Left atrial size was mildly dilated. No left atrial/left atrial  appendage thrombus was detected.   4. The mitral  valve is normal in structure. Mild to moderate mitral valve  regurgitation.   5. The aortic valve is tricuspid. Aortic valve regurgitation is not  visualized.   6. There is mild (Grade II) plaque involving the descending aorta.   7. Evidence of atrial level shunting detected by color flow Doppler.  Agitated saline contrast bubble study was positive with shunting observed  within 3-6 cardiac cycles suggestive of interatrial shunt. There is a  small patent foramen ovale.   Carotid US: Summary:  Right Carotid: The extracranial vessels were near-normal with only minimal  wall                 thickening or plaque.   Left Carotid: The extracranial vessels were near-normal with only minimal  wall                thickening or plaque.   Vertebrals:  Bilateral vertebral arteries demonstrate antegrade flow.  Subclavians: Normal flow hemodynamics were seen in bilateral subclavian               arteries.   MRI Brain: IMPRESSION: Acute infarcts of the  right parietotemporal lobes greater than left frontal lobe. Additional punctate left parietal and right occipital acute infarcts.   Mild chronic microvascular ischemic changes. Few chronic microhemorrhages.  EKG:  EKG is ordered today.  The ekg ordered today demonstrates normal sinus rhythm 73 bpm, within normal limits.  Recent Labs: 03/05/2021: TSH 0.902 04/29/2021: Magnesium 2.1 06/17/2021: ALT 15 06/24/2021: BUN 6; Creatinine, Ser 0.70; Hemoglobin 13.9; Platelets 408; Potassium 3.9; Sodium 139  Recent Lipid Panel    Component Value Date/Time   CHOL 158 01/05/2021 0317   TRIG 70 01/05/2021 0317   HDL 44 01/05/2021 0317   CHOLHDL 3.6 01/05/2021 0317   VLDL 14 01/05/2021 0317   LDLCALC 100 (H) 01/05/2021 0317     Risk Assessment/Calculations:           Physical Exam:    VS:  BP 122/76   Pulse 73   Ht 5\' 5"  (1.651 m)   Wt 177 lb 6.4 oz (80.5 kg)   SpO2 95%   BMI 29.52 kg/m     Wt Readings from Last 3 Encounters:  08/04/21 177 lb 6.4 oz (80.5 kg)  07/29/21 175 lb 12.8 oz (79.7 kg)  07/03/21 178 lb (80.7 kg)     GEN:  Well nourished, well developed in no acute distress HEENT: Normal NECK: No JVD; No carotid bruits LYMPHATICS: No lymphadenopathy CARDIAC: RRR, no murmurs, rubs, gallops RESPIRATORY:  Clear to auscultation without rales, wheezing or rhonchi  ABDOMEN: Soft, non-tender, non-distended MUSCULOSKELETAL:  No edema; No deformity  SKIN: Warm and dry NEUROLOGIC:  Alert and oriented x 3 PSYCHIATRIC:  Normal affect   ASSESSMENT:    1. PFO (patent foramen ovale)    PLAN:    In order of problems listed above:  75 year old woman with PFO and stroke in the setting of hemolytic anemia, DVT, and probable PE.  She has completed a course of therapeutic anticoagulation with rivaroxaban and is now on no antiplatelet or anticoagulant therapy.  The patient has an aspirin allergy.  I have personally reviewed her transesophageal echo images which shows normal LV and RV  function, no significant valvular heart disease, and a moderate PFO with color flow visualized across the interatrial septum as well as a moderately positive early bubble study with right to left shunting demonstrated.  We discussed considerations around transcatheter PFO closure with respect to secondary stroke  reduction.  The patient has a classic clinical scenario for paradoxical embolism as she was diagnosed with an acute DVT in the same setting as her embolic stroke.  We discussed specific risks of transcatheter PFO closure today.  These include bleeding, vascular injury, infection, stroke, myocardial infarction, device erosion, hemopericardium, emergent pericardiocentesis, emergent cardiac surgery, device embolization, atrial fibrillation or other arrhythmia, and death.  The patient understands that the risks of serious complication occur at a rate of 1% or less, with the exception of atrial fibrillation which occurs at a rate of 2 to 3%.  She would like to proceed after checking with her other treating physicians to make sure that we are all on the same page.  I would plan on treating her with clopidogrel monotherapy for 6 months as she is aspirin allergic.            Medication Adjustments/Labs and Tests Ordered: Current medicines are reviewed at length with the patient today.  Concerns regarding medicines are outlined above.  Orders Placed This Encounter  Procedures   Basic Metabolic Panel (BMET)   CBC   EKG 12-Lead    Meds ordered this encounter  Medications   clopidogrel (PLAVIX) 75 MG tablet    Sig: Take 1 tablet (75 mg total) by mouth daily. Start 5 days before PFO closure procedure    Dispense:  90 tablet    Refill:  1     Patient Instructions   CLOSURE INSTRUCTIONS: You are scheduled for a PFO/ASD CLOSURE on Friday, December 9 with Dr. Sherren Mocha.  1. Please arrive at the [facility] (Main Entrance A) at [facility]: [address] [place], [place] [address]  at 5:30 AM (This time is two hours before your procedure to ensure your preparation). Free valet parking service is available. You are allowed ONE visitor in the waiting room during your procedure. Both you and your guest must wear masks. Special note: Every effort is made to have your procedure done on time. Please understand that emergencies sometimes delay scheduled procedures.  2. Diet: Make sure to stay well hydrated the day before your procedure! Do not eat solid foods after midnight.  You may have clear liquids until 5am upon the day of the procedure.  3. Labs: done in office today  4. Medication instructions in preparation for your procedure:     MAKE SURE TO TAKE YOUR  PLAVIX the morning of your closure     Other meds may be taken as directed with a sip of water.    5. Plan for one night stay--bring personal belongings (this is a disclaimer, not an intention. We want you to go home!). 6. Bring a current list of your medications and current insurance cards. 7. You MUST have a responsible person to drive you home. 8. Someone MUST be with you the first 24 hours after you arrive home or your discharge will be delayed. 9. Please wear clothes that are easy to get on and off and wear slip-on shoes.   FOLLOW-UP APPOINTMENT: You are scheduled for your 1 month follow-up on Monday, September 28, 2021 with Kathyrn Drown, NP [place]   Signed, Sherren Mocha, MD  08/04/2021 3:27 PM    [place]

## 2021-08-04 NOTE — H&P (View-Only) (Signed)
Cardiology Office Note:    Date:  08/04/2021   ID:  Tabitha, [name] [birth year], MRN [id number]  PCP:  Burton Apley, MD   [facility] Providers Cardiologist:  None     Referring MD: Tabitha Records, MD   Chief Complaint  Patient presents with   PFO     History of Present Illness:    [name] is a 75 y.o. female presenting for evaluation of PFO.  In April 2022 the patient was hospitalized with generalized weakness and she was found to have leukocytosis and severe anemia with hemoglobin 5.7.  She was ultimately diagnosed with autoimmune hemolytic anemia.  During that hospitalization, she became acutely aphasic and dysarthric.  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  She was also found to have lower extremity DVT and a 2D echocardiogram suggested pulmonary embolus as well (McConnell sign).  She was not found to have any significant atherosclerotic disease.  The patient is here with her husband today.  She was treated with steroids and rituximab for autoimmune hemolytic anemia which is now in remission.  She has been released by hematology and is following with her primary care physician.  She has some residual symptoms of headache, word finding difficulty at times, and right-sided numbness.  Overall she has improved significantly since her stroke and is not having any cardiovascular symptoms other than occasional heart palpitations which she relates to anxiety.  She has worn a 30-day event monitor and has mailed back the monitor, currently waiting processing and interpretation.  She denies chest pain, chest pressure, leg swelling, orthopnea, PND, syncope, or shortness of breath.  Past Medical History:  Diagnosis Date   Anxiety    Dysphagia    HYPERTHYROIDISM    LYMPHADENOPATHY    MIGRAINE HEADACHE    Neuromuscular disorder (HCC)    condition where muscle separates from bone left chest, quarter size, pain intermittent    Past Surgical History:  Procedure  Laterality Date   BALLOON DILATION N/A 09/18/2020   Procedure: BALLOON DILATION;  Surgeon: Kerin Salen, MD;  Location: [facility] ENDOSCOPY;  Service: Gastroenterology;  Laterality: N/A;   BUBBLE STUDY  07/03/2021   Procedure: BUBBLE STUDY;  Surgeon: Lewayne Bunting, MD;  Location: [facility] ENDOSCOPY;  Service: Cardiovascular;;   ESOPHAGOGASTRODUODENOSCOPY (EGD) WITH PROPOFOL N/A 09/18/2020   Procedure: ESOPHAGOGASTRODUODENOSCOPY (EGD) WITH PROPOFOL;  Surgeon: Kerin Salen, MD;  Location: [facility] ENDOSCOPY;  Service: Gastroenterology;  Laterality: N/A;   EYE SURGERY Bilateral    Cataract L eye 1-20, R eye 3-20    FOREIGN BODY REMOVAL  09/18/2020   Procedure: FOREIGN BODY REMOVAL;  Surgeon: Kerin Salen, MD;  Location: [facility] ENDOSCOPY;  Service: Gastroenterology;;   MOHS SURGERY  2001   REVERSE SHOULDER ARTHROPLASTY Right 03/16/2019   Procedure: REVERSE SHOULDER ARTHROPLASTY;  Surgeon: Beverely Low, MD;  Location: [facility] ORS;  Service: Orthopedics;  Laterality: Right;  interscalene block   TEE WITHOUT CARDIOVERSION N/A 07/03/2021   Procedure: TRANSESOPHAGEAL ECHOCARDIOGRAM (TEE);  Surgeon: Lewayne Bunting, MD;  Location: [facility] ENDOSCOPY;  Service: Cardiovascular;  Laterality: N/A;   TUBAL LIGATION      Current Medications: Current Meds  Medication Sig   acetaminophen (TYLENOL) 500 MG tablet Take 1,000 mg by mouth every 4 (four) hours as needed for headache.   Calcium Carbonate+Vitamin D 600-200 MG-UNIT TABS Take 1 tablet by mouth 2 (two) times daily.   clopidogrel (PLAVIX) 75 MG tablet Take 1 tablet (75 mg total) by mouth daily. Start 5 days  before PFO closure procedure   folic acid (FOLVITE) 1 MG tablet Take 2 tablets (2 mg total) by mouth daily. (Patient taking differently: Take 1 mg by mouth in the morning and at bedtime.)   gabapentin (NEURONTIN) 300 MG capsule TAKE 1 CAPSULE BY MOUTH IN  THE MORNING , 1 CAPSULE BY  MOUTH IN THE AFTERNOON ,  AND 2 CAPSULES BEFORE  BEDTIME (Patient taking differently: Take 300 mg  by mouth 4 (four) times daily.)   HYDROcodone-acetaminophen (NORCO/VICODIN) 5-325 MG tablet Take 1 tablet by mouth every 4 (four) hours as needed for severe pain.   levothyroxine (SYNTHROID) 112 MCG tablet Take 112 mcg by mouth daily before breakfast.   lidocaine-prilocaine (EMLA) cream Apply to affected area once (Patient taking differently: Apply to affected area once PRN)   LORazepam (ATIVAN) 2 MG tablet Take 2 mg by mouth 3 (three) times daily as needed for anxiety.   methocarbamol (ROBAXIN) 750 MG tablet Take 750 mg by mouth 2 (two) times daily as needed for muscle spasms.   Multiple Vitamins-Minerals (MULTIVITAMIN WITH MINERALS) tablet Take 1 tablet by mouth daily.   nystatin (MYCOSTATIN/NYSTOP) powder Apply 1 application topically 3 (three) times daily as needed (irritation).   polycarbophil (FIBERCON) 625 MG tablet Take 625 mg by mouth in the morning and at bedtime.   polyvinyl alcohol (LIQUIFILM TEARS) 1.4 % ophthalmic solution Place 1 drop into both eyes as needed for dry eyes.   sertraline (ZOLOFT) 50 MG tablet Take 50 mg by mouth daily.     Allergies:   Oxycodone, Aspirin, Imipenem, Statins, Cephalexin, Clindamycin/lincomycin, Contrast media [iodinated diagnostic agents], Losartan potassium-hctz, Penicillins, and Quinolones   Social History   Socioeconomic History   Marital status: Married    Spouse name: Not on file   Number of children: Not on file   Years of education: Not on file   Highest education level: Not on file  Occupational History   Not on file  Tobacco Use   Smoking status: Former    Types: Cigarettes    Quit date: 1969    Years since quitting: 53.9   Smokeless tobacco: Never  Vaping Use   Vaping Use: Never used  Substance and Sexual Activity   Alcohol use: No   Drug use: No   Sexual activity: Not on file  Other Topics Concern   Not on file  Social History Narrative   Not on file   Social Determinants of Health   Financial Resource Strain: Not on  file  Food Insecurity: Not on file  Transportation Needs: Not on file  Physical Activity: Not on file  Stress: Not on file  Social Connections: Not on file     Family History: The patient's family history includes Arthritis in her brother and sister; Diabetes in her brother and brother; Heart disease in her mother; Neuropathy in her brother and sister; Stroke in her father.  ROS:   Please see the history of present illness.    All other systems reviewed and are negative.  EKGs/Labs/Other Studies Reviewed:    The following studies were reviewed today: TEE:  1. Left ventricular ejection fraction, by estimation, is 55 to 60%. The  left ventricle has normal function. The left ventricle has no regional  wall motion abnormalities.   2. Right ventricular systolic function is normal. The right ventricular  size is normal.   3. Left atrial size was mildly dilated. No left atrial/left atrial  appendage thrombus was detected.   4. The mitral  valve is normal in structure. Mild to moderate mitral valve  regurgitation.   5. The aortic valve is tricuspid. Aortic valve regurgitation is not  visualized.   6. There is mild (Grade II) plaque involving the descending aorta.   7. Evidence of atrial level shunting detected by color flow Doppler.  Agitated saline contrast bubble study was positive with shunting observed  within 3-6 cardiac cycles suggestive of interatrial shunt. There is a  small patent foramen ovale.   Carotid US: Summary:  Right Carotid: The extracranial vessels were near-normal with only minimal  wall                 thickening or plaque.   Left Carotid: The extracranial vessels were near-normal with only minimal  wall                thickening or plaque.   Vertebrals:  Bilateral vertebral arteries demonstrate antegrade flow.  Subclavians: Normal flow hemodynamics were seen in bilateral subclavian               arteries.   MRI Brain: IMPRESSION: Acute infarcts of the  right parietotemporal lobes greater than left frontal lobe. Additional punctate left parietal and right occipital acute infarcts.   Mild chronic microvascular ischemic changes. Few chronic microhemorrhages.  EKG:  EKG is ordered today.  The ekg ordered today demonstrates normal sinus rhythm 73 bpm, within normal limits.  Recent Labs: 03/05/2021: TSH 0.902 04/29/2021: Magnesium 2.1 06/17/2021: ALT 15 06/24/2021: BUN 6; Creatinine, Ser 0.70; Hemoglobin 13.9; Platelets 408; Potassium 3.9; Sodium 139  Recent Lipid Panel    Component Value Date/Time   CHOL 158 01/05/2021 0317   TRIG 70 01/05/2021 0317   HDL 44 01/05/2021 0317   CHOLHDL 3.6 01/05/2021 0317   VLDL 14 01/05/2021 0317   LDLCALC 100 (H) 01/05/2021 0317     Risk Assessment/Calculations:           Physical Exam:    VS:  BP 122/76   Pulse 73   Ht 5\' 5"  (1.651 m)   Wt 177 lb 6.4 oz (80.5 kg)   SpO2 95%   BMI 29.52 kg/m     Wt Readings from Last 3 Encounters:  08/04/21 177 lb 6.4 oz (80.5 kg)  07/29/21 175 lb 12.8 oz (79.7 kg)  07/03/21 178 lb (80.7 kg)     GEN:  Well nourished, well developed in no acute distress HEENT: Normal NECK: No JVD; No carotid bruits LYMPHATICS: No lymphadenopathy CARDIAC: RRR, no murmurs, rubs, gallops RESPIRATORY:  Clear to auscultation without rales, wheezing or rhonchi  ABDOMEN: Soft, non-tender, non-distended MUSCULOSKELETAL:  No edema; No deformity  SKIN: Warm and dry NEUROLOGIC:  Alert and oriented x 3 PSYCHIATRIC:  Normal affect   ASSESSMENT:    1. PFO (patent foramen ovale)    PLAN:    In order of problems listed above:  75 year old woman with PFO and stroke in the setting of hemolytic anemia, DVT, and probable PE.  She has completed a course of therapeutic anticoagulation with rivaroxaban and is now on no antiplatelet or anticoagulant therapy.  The patient has an aspirin allergy.  I have personally reviewed her transesophageal echo images which shows normal LV and RV  function, no significant valvular heart disease, and a moderate PFO with color flow visualized across the interatrial septum as well as a moderately positive early bubble study with right to left shunting demonstrated.  We discussed considerations around transcatheter PFO closure with respect to secondary stroke  reduction.  The patient has a classic clinical scenario for paradoxical embolism as she was diagnosed with an acute DVT in the same setting as her embolic stroke.  We discussed specific risks of transcatheter PFO closure today.  These include bleeding, vascular injury, infection, stroke, myocardial infarction, device erosion, hemopericardium, emergent pericardiocentesis, emergent cardiac surgery, device embolization, atrial fibrillation or other arrhythmia, and death.  The patient understands that the risks of serious complication occur at a rate of 1% or less, with the exception of atrial fibrillation which occurs at a rate of 2 to 3%.  She would like to proceed after checking with her other treating physicians to make sure that we are all on the same page.  I would plan on treating her with clopidogrel monotherapy for 6 months as she is aspirin allergic.            Medication Adjustments/Labs and Tests Ordered: Current medicines are reviewed at length with the patient today.  Concerns regarding medicines are outlined above.  Orders Placed This Encounter  Procedures   Basic Metabolic Panel (BMET)   CBC   EKG 12-Lead    Meds ordered this encounter  Medications   clopidogrel (PLAVIX) 75 MG tablet    Sig: Take 1 tablet (75 mg total) by mouth daily. Start 5 days before PFO closure procedure    Dispense:  90 tablet    Refill:  1     Patient Instructions   CLOSURE INSTRUCTIONS: You are scheduled for a PFO/ASD CLOSURE on Friday, December 9 with Dr. Sherren Mocha.  1. Please arrive at the [facility] (Main Entrance A) at [facility]: [address] [place], [place] [address]  at 5:30 AM (This time is two hours before your procedure to ensure your preparation). Free valet parking service is available. You are allowed ONE visitor in the waiting room during your procedure. Both you and your guest must wear masks. Special note: Every effort is made to have your procedure done on time. Please understand that emergencies sometimes delay scheduled procedures.  2. Diet: Make sure to stay well hydrated the day before your procedure! Do not eat solid foods after midnight.  You may have clear liquids until 5am upon the day of the procedure.  3. Labs: done in office today  4. Medication instructions in preparation for your procedure:     MAKE SURE TO TAKE YOUR  PLAVIX the morning of your closure     Other meds may be taken as directed with a sip of water.    5. Plan for one night stay--bring personal belongings (this is a disclaimer, not an intention. We want you to go home!). 6. Bring a current list of your medications and current insurance cards. 7. You MUST have a responsible person to drive you home. 8. Someone MUST be with you the first 24 hours after you arrive home or your discharge will be delayed. 9. Please wear clothes that are easy to get on and off and wear slip-on shoes.   FOLLOW-UP APPOINTMENT: You are scheduled for your 1 month follow-up on Monday, September 28, 2021 with Kathyrn Drown, NP [address]   Signed, Sherren Mocha, MD  08/04/2021 3:27 PM    [place]

## 2021-08-04 NOTE — Patient Instructions (Signed)
  CLOSURE INSTRUCTIONS: You are scheduled for a PFO/ASD CLOSURE on Friday, December 9 with Dr. Tonny Bollman.  1. Please arrive at the [facility] (Main Entrance A) at [facility]: [address] [place], [place] [address] at 5:30 AM (This time is two hours before your procedure to ensure your preparation). Free valet parking service is available. You are allowed ONE visitor in the waiting room during your procedure. Both you and your guest must wear masks. Special note: Every effort is made to have your procedure done on time. Please understand that emergencies sometimes delay scheduled procedures.  2. Diet: Make sure to stay well hydrated the day before your procedure! Do not eat solid foods after midnight.  You may have clear liquids until 5am upon the day of the procedure.  3. Labs: done in office today  4. Medication instructions in preparation for your procedure:     MAKE SURE TO TAKE YOUR  PLAVIX the morning of your closure     Other meds may be taken as directed with a sip of water.    5. Plan for one night stay--bring personal belongings (this is a disclaimer, not an intention. We want you to go home!). 6. Bring a current list of your medications and current insurance cards. 7. You MUST have a responsible person to drive you home. 8. Someone MUST be with you the first 24 hours after you arrive home or your discharge will be delayed. 9. Please wear clothes that are easy to get on and off and wear slip-on shoes.   FOLLOW-UP APPOINTMENT: You are scheduled for your 1 month follow-up on Monday, September 28, 2021 with Georgie Chard, NP [address]

## 2021-08-05 ENCOUNTER — Ambulatory Visit: Payer: Medicare Other | Admitting: Neurology

## 2021-08-07 ENCOUNTER — Encounter: Payer: Self-pay | Admitting: Cardiovascular Disease

## 2021-08-18 ENCOUNTER — Other Ambulatory Visit: Payer: Self-pay | Admitting: Neurology

## 2021-08-18 ENCOUNTER — Ambulatory Visit
Admission: RE | Admit: 2021-08-18 | Discharge: 2021-08-18 | Disposition: A | Discharge status: Home / Self Care | Payer: Medicare Other | Source: Ambulatory Visit | Attending: Neurology | Admitting: Neurology

## 2021-08-18 ENCOUNTER — Institutional Professional Consult (permissible substitution): Payer: Medicare Other | Admitting: Cardiovascular Disease

## 2021-08-18 DIAGNOSIS — R519 Headache, unspecified: Secondary | ICD-10-CM | POA: Diagnosis not present

## 2021-08-18 DIAGNOSIS — R51 Headache with orthostatic component, not elsewhere classified: Secondary | ICD-10-CM | POA: Diagnosis not present

## 2021-08-18 DIAGNOSIS — H539 Unspecified visual disturbance: Secondary | ICD-10-CM

## 2021-08-18 MED ORDER — GADOBENATE DIMEGLUMINE 529 MG/ML IV SOLN: 15.0000 mL | Freq: Once | INTRAVENOUS | Status: DC | PRN

## 2021-08-20 ENCOUNTER — Encounter: Payer: Self-pay | Admitting: Neurology

## 2021-08-27 ENCOUNTER — Telehealth: Payer: Self-pay | Admitting: *Deleted

## 2021-08-27 NOTE — Telephone Encounter (Signed)
*   Patient reports her husband has dementia.   Patient states she called the hospital yesterday (did not give me name) and got permission for her son to be with her husband in the waiting room while she is there for procedure.

## 2021-08-27 NOTE — Telephone Encounter (Addendum)
PFO Closure scheduled at [facility] for: Friday August 28, 2021 7:30 AM [facility] Main Entrance A [facility]) at: 5:30 AM   Diet-no solid food after midnight prior to cath, clear liquids until 5 AM day of procedure.   Medication instructions for procedure: -Usual morning medications can be taken pre-cath with sips of water including Plavix 75 mg. Patient is allergic to aspirin.    Confirmed patient has responsible adult to drive home post procedure and be with patient first 24 hours after arriving home.  [facility] does allow one visitor to accompany you and wait in the hospital waiting room while you are there for your procedure. You and your visitor will be asked to wear a mask once you enter the hospital. *   Patient reports does not currently have any new symptoms concerning for COVID-19 and no household members with COVID-19 like illness.    Reviewed procedure/mask/visitor instructions with patient.

## 2021-08-28 ENCOUNTER — Other Ambulatory Visit: Payer: Self-pay

## 2021-08-28 ENCOUNTER — Ambulatory Visit (HOSPITAL_BASED_OUTPATIENT_CLINIC_OR_DEPARTMENT_OTHER): Payer: Medicare Other

## 2021-08-28 ENCOUNTER — Ambulatory Visit (HOSPITAL_COMMUNITY)
Admission: RE | Disposition: A | Discharge status: Home / Self Care | Payer: Medicare Other | Source: Ambulatory Visit | Attending: Cardiovascular Disease

## 2021-08-28 ENCOUNTER — Ambulatory Visit (HOSPITAL_COMMUNITY)
Admission: RE | Admit: 2021-08-28 | Discharge: 2021-08-28 | Disposition: A | Discharge status: Home / Self Care | Payer: Medicare Other | Source: Ambulatory Visit | Attending: Cardiovascular Disease | Admitting: Cardiovascular Disease

## 2021-08-28 ENCOUNTER — Encounter (HOSPITAL_COMMUNITY): Payer: Self-pay | Admitting: Cardiovascular Disease

## 2021-08-28 DIAGNOSIS — Q2112 Patent foramen ovale: Secondary | ICD-10-CM | POA: Insufficient documentation

## 2021-08-28 DIAGNOSIS — Z886 Allergy status to analgesic agent status: Secondary | ICD-10-CM | POA: Diagnosis not present

## 2021-08-28 DIAGNOSIS — Z8673 Personal history of transient ischemic attack (TIA), and cerebral infarction without residual deficits: Secondary | ICD-10-CM | POA: Insufficient documentation

## 2021-08-28 HISTORY — PX: PATENT FORAMEN OVALE(PFO) CLOSURE: CATH118300

## 2021-08-28 LAB — ECHOCARDIOGRAM LIMITED
Height: 65 in
Weight: 2800 oz

## 2021-08-28 LAB — POCT ACTIVATED CLOTTING TIME
Activated Clotting Time: 191 seconds
Activated Clotting Time: 209 seconds

## 2021-08-28 SURGERY — PATENT FORAMEN OVALE (PFO) CLOSURE
Anesthesia: LOCAL

## 2021-08-28 MED ORDER — VANCOMYCIN HCL IN DEXTROSE 1-5 GM/200ML-% IV SOLN
INTRAVENOUS | Status: AC
  Filled 2021-08-28: qty 200

## 2021-08-28 MED ORDER — ACETAMINOPHEN 325 MG PO TABS: 650.0000 mg | ORAL_TABLET | ORAL | Status: DC | PRN

## 2021-08-28 MED ORDER — SODIUM CHLORIDE 0.9% FLUSH: 3.0000 mL | INTRAVENOUS | Status: DC | PRN

## 2021-08-28 MED ORDER — FENTANYL CITRATE (PF) 100 MCG/2ML IJ SOLN
INTRAMUSCULAR | Status: AC
  Filled 2021-08-28: qty 2

## 2021-08-28 MED ORDER — HEPARIN (PORCINE) IN NACL 2000-0.9 UNIT/L-% IV SOLN
INTRAVENOUS | Status: DC | PRN
  Administered 2021-08-28: 1000 mL

## 2021-08-28 MED ORDER — CLOPIDOGREL BISULFATE 75 MG PO TABS: 75.0000 mg | ORAL_TABLET | ORAL | Status: AC

## 2021-08-28 MED ORDER — VANCOMYCIN HCL IN DEXTROSE 1-5 GM/200ML-% IV SOLN
1000.0000 mg | INTRAVENOUS | Status: AC
  Administered 2021-08-28: 1000 mg via INTRAVENOUS

## 2021-08-28 MED ORDER — MIDAZOLAM HCL 2 MG/2ML IJ SOLN
INTRAMUSCULAR | Status: AC
  Filled 2021-08-28: qty 2

## 2021-08-28 MED ORDER — SODIUM CHLORIDE 0.9% FLUSH: 3.0000 mL | Freq: Two times a day (BID) | INTRAVENOUS | Status: DC

## 2021-08-28 MED ORDER — LIDOCAINE HCL (PF) 1 % IJ SOLN
INTRAMUSCULAR | Status: AC
  Filled 2021-08-28: qty 30

## 2021-08-28 MED ORDER — LABETALOL HCL 5 MG/ML IV SOLN: 10.0000 mg | INTRAVENOUS | Status: DC | PRN

## 2021-08-28 MED ORDER — ONDANSETRON HCL 4 MG/2ML IJ SOLN: 4.0000 mg | Freq: Four times a day (QID) | INTRAMUSCULAR | Status: DC | PRN

## 2021-08-28 MED ORDER — HEPARIN SODIUM (PORCINE) 1000 UNIT/ML IJ SOLN
INTRAMUSCULAR | Status: AC
  Filled 2021-08-28: qty 10

## 2021-08-28 MED ORDER — SODIUM CHLORIDE 0.9 % IV SOLN: 250.0000 mL | INTRAVENOUS | Status: DC | PRN

## 2021-08-28 MED ORDER — LIDOCAINE HCL (PF) 1 % IJ SOLN
INTRAMUSCULAR | Status: DC | PRN
  Administered 2021-08-28: 10 mL

## 2021-08-28 MED ORDER — SODIUM CHLORIDE 0.9 % IV SOLN: INTRAVENOUS | Status: DC

## 2021-08-28 MED ORDER — HEPARIN SODIUM (PORCINE) 1000 UNIT/ML IJ SOLN
INTRAMUSCULAR | Status: DC | PRN
  Administered 2021-08-28: 3000 [IU] via INTRAVENOUS
  Administered 2021-08-28: 6000 [IU] via INTRAVENOUS

## 2021-08-28 MED ORDER — FENTANYL CITRATE (PF) 100 MCG/2ML IJ SOLN
INTRAMUSCULAR | Status: DC | PRN
  Administered 2021-08-28 (×2): 25 ug via INTRAVENOUS

## 2021-08-28 MED ORDER — HEPARIN (PORCINE) IN NACL 1000-0.9 UT/500ML-% IV SOLN
INTRAVENOUS | Status: AC
  Filled 2021-08-28: qty 1000

## 2021-08-28 MED ORDER — HYDRALAZINE HCL 20 MG/ML IJ SOLN: 10.0000 mg | INTRAMUSCULAR | Status: DC | PRN

## 2021-08-28 MED ORDER — MIDAZOLAM HCL 2 MG/2ML IJ SOLN
INTRAMUSCULAR | Status: DC | PRN
  Administered 2021-08-28: 2 mg via INTRAVENOUS
  Administered 2021-08-28: 1 mg via INTRAVENOUS

## 2021-08-28 SURGICAL SUPPLY — 19 items
CATH 8FR ACUNAV REPROCESSED (CATHETERS) IMPLANT
CATH EXPO 5F MPA-1 (CATHETERS) ×1 IMPLANT
CATH REPROCESSED 8FR ACUNAV (CATHETERS) ×2 IMPLANT
CLOSURE PERCLOSE PROSTYLE (VASCULAR PRODUCTS) ×3 IMPLANT
COVER SWIFTLINK CONNECTOR (BAG) ×1 IMPLANT
GUIDEWIRE AMPLATZER 1.5JX260 (WIRE) ×1 IMPLANT
KIT HEART LEFT (KITS) ×2 IMPLANT
OCCLUDER PFO TALISMAN 25-18 (Prosthesis & Implant Heart) IMPLANT
PACK CARDIAC CATHETERIZATION (CUSTOM PROCEDURE TRAY) ×2 IMPLANT
SHEATH DELIVERY TALISMAN 8F 80 (SHEATH) IMPLANT
SHEATH INTROD W/O MIN 9FR 25CM (SHEATH) ×1 IMPLANT
SHEATH PINNACLE 8F 10CM (SHEATH) ×1 IMPLANT
SHEATH PROBE COVER 6X72 (BAG) ×1 IMPLANT
TALISMAN DELIVERY SHEATH 8F 80 (SHEATH) ×2
TALISMAN PFO OCCLUDER 25-18 (Prosthesis & Implant Heart) ×2 IMPLANT
TRANSDUCER W/STOPCOCK (MISCELLANEOUS) ×2 IMPLANT
TUBING CIL FLEX 10 FLL-RA (TUBING) ×2 IMPLANT
WIRE EMERALD 3MM-J .035X150CM (WIRE) ×1 IMPLANT
WIRE MICRO SET SILHO 5FR 7 (SHEATH) ×1 IMPLANT

## 2021-08-28 NOTE — Progress Notes (Signed)
  Echocardiogram 2D Echocardiogram has been performed.  Augustine Radar 08/28/2021, 9:43 AM

## 2021-08-28 NOTE — Discharge Instructions (Signed)

## 2021-08-28 NOTE — Interval H&P Note (Signed)
History and Physical Interval Note:  08/28/2021 8:03 AM  [name]  has presented today for surgery, with the diagnosis of pfo.  The various methods of treatment have been discussed with the patient and family. After consideration of risks, benefits and other options for treatment, the patient has consented to  Procedure(s): PATENT FORAMEN OVALE (PFO) CLOSURE (N/A) as a surgical intervention.  The patient's history has been reviewed, patient examined, no change in status, stable for surgery.  I have reviewed the patient's chart and labs.  Questions were answered to the patient's satisfaction.     Tonny Bollman

## 2021-08-31 MED FILL — Heparin Sod (Porcine)-NaCl IV Soln 1000 Unit/500ML-0.9%: INTRAVENOUS | Qty: 1000 | Status: AC

## 2021-09-22 ENCOUNTER — Encounter (HOSPITAL_COMMUNITY): Payer: Self-pay | Admitting: Hematology & Oncology

## 2021-09-27 NOTE — Progress Notes (Signed)
[facility]   MULTIDISCIPLINARY HEART VALVE CLINIC                                     Cardiology Office Note:    Date:  09/28/2021   ID:  [name], DOB [birth year], MRN [id number]  PCP:  Burton Apleyoberts, Ronald, MD  [facility] Cardiologist:  Dr. Verdis PrimeHenry Smith, MD   [facility] Electrophysiologist:  None   Referring MD: Burton Apleyoberts, Ronald, MD   Chief Complaint  Patient presents with   Follow-up    1 month s/p PFO closure   /History of Present Illness:    [name] is a 76 y.o. female with a hx of DVT, PE and stroke who is now s/p PFO closure.   Ms. [name] was referred to Dr. Excell Seltzerooper for the evaluation of PFO closure after she was hospitalized with generalized weakness, found to have leukocytosis and severe anemia with a hemoglobin at 5.7. She was ultimately diagnosed with autoimmune hemolytic anemia. During that hospitalization, she became acutely aphasic and dysarthric. Brain MRI showed multiple acute infarcts. She was also found to have lower extremity DVT and an echocardiogram suggested pulmonary embolus as well (McConnell sign). TEE was positive for shunting. She was placed on PE anticoagulation with Xarelto. After discharge she was treated with steroids and rituximab for autoimmune hemolytic anemia. She was ultimately released by hematology and was managed by her PCP. She was then seen by Dr. Excell Seltzerooper for the evaluation of PFO closure which was subsequently performed 08/28/21 with 25mm Amplatzer Talisman PFO occluder device.   Today she presents with her husband and states that she has been doing very well since device placement. She does report one brief episode of left sided anterior chest pain, reported as a fleeting episode while laying in the bed, also with anxiety symptoms. No exertional qualities. No associated symptoms of SOB, diaphoresis. Feels this is musculoskeletal. Plan to follow for recurrence. She is tolerating Plavix with no issues. She follows closely with  PCP and has sleep study scheduled the end of the month. Denies SOB, new neuro symptoms, dizziness, pre-syncope or syncope.   Past Medical History:  Diagnosis Date   Anxiety    Dysphagia    HYPERTHYROIDISM    LYMPHADENOPATHY    MIGRAINE HEADACHE    Neuromuscular disorder (HCC)    condition where muscle separates from bone left chest, quarter size, pain intermittent   Past Surgical History:  Procedure Laterality Date   BALLOON DILATION N/A 09/18/2020   Procedure: BALLOON DILATION;  Surgeon: Kerin SalenKarki, Arya, MD;  Location: [facility];  Service: Gastroenterology;  Laterality: N/A;   BUBBLE STUDY  07/03/2021   Procedure: BUBBLE STUDY;  Surgeon: Lewayne Buntingrenshaw, Brian S, MD;  [facility];  Service: Cardiovascular;;   ESOPHAGOGASTRODUODENOSCOPY (EGD) WITH PROPOFOL N/A 09/18/2020   Procedure: ESOPHAGOGASTRODUODENOSCOPY (EGD) WITH PROPOFOL;  Surgeon: Kerin SalenKarki, Arya, MD;  Location: [facility];  Service: Gastroenterology;  Laterality: N/A;   EYE SURGERY Bilateral    Cataract L eye 1-20, R eye 3-20    FOREIGN BODY REMOVAL  09/18/2020   Procedure: FOREIGN BODY REMOVAL;  Surgeon: Kerin SalenKarki, Arya, MD;  [facility];  Service: Gastroenterology;;   MOHS SURGERY  2001   PATENT FORAMEN OVALE(PFO) CLOSURE N/A 08/28/2021   Procedure: PATENT FORAMEN OVALE (PFO) CLOSURE;  Surgeon: Tonny Bollmanooper, Michael, MD;  [facility];  Service: Cardiovascular;  Laterality: N/A;  REVERSE SHOULDER ARTHROPLASTY Right 03/16/2019   Procedure: REVERSE SHOULDER ARTHROPLASTY;  Surgeon: Netta Cedars, MD;  Location: [facility];  Service: Orthopedics;  Laterality: Right;  interscalene block   TEE WITHOUT CARDIOVERSION N/A 07/03/2021   Procedure: TRANSESOPHAGEAL ECHOCARDIOGRAM (TEE);  Surgeon: Lelon Perla, MD;  Location: [facility];  Service: Cardiovascular;  Laterality: N/A;   TUBAL LIGATION      Current Medications: Current Meds  Medication Sig   acetaminophen (TYLENOL) 500 MG tablet Take 1,000 mg by  mouth every 4 (four) hours as needed for headache.   Calcium Carbonate+Vitamin D 600-200 MG-UNIT TABS Take 1 tablet by mouth 2 (two) times daily.   carboxymethylcellulose (REFRESH PLUS) 0.5 % SOLN Place 1 drop into both eyes 3 (three) times daily as needed (dry eyes).   clopidogrel (PLAVIX) 75 MG tablet Take 1 tablet (75 mg total) by mouth daily. Start 5 days before PFO closure procedure   diphenhydrAMINE (BENADRYL) 25 MG tablet Take 25-50 mg by mouth daily as needed for allergies.   folic acid (FOLVITE) 1 MG tablet Take 2 tablets (2 mg total) by mouth daily.   gabapentin (NEURONTIN) 300 MG capsule TAKE 1 CAPSULE BY MOUTH IN  THE MORNING , 1 CAPSULE BY  MOUTH IN THE AFTERNOON ,  AND 2 CAPSULES BEFORE  BEDTIME   HYDROcodone-acetaminophen (NORCO/VICODIN) 5-325 MG tablet Take 1 tablet by mouth every 4 (four) hours as needed for severe pain.   levothyroxine (SYNTHROID) 112 MCG tablet Take 112 mcg by mouth daily before breakfast.   LORazepam (ATIVAN) 2 MG tablet Take 2 mg by mouth 3 (three) times daily as needed for anxiety.   methocarbamol (ROBAXIN) 750 MG tablet Take 750 mg by mouth 2 (two) times daily as needed for muscle spasms.   Multiple Vitamins-Minerals (MULTIVITAMIN WITH MINERALS) tablet Take 1 tablet by mouth daily.   nystatin (MYCOSTATIN/NYSTOP) powder Apply 1 application topically 3 (three) times daily as needed (irritation).   polycarbophil (FIBERCON) 625 MG tablet Take 625 mg by mouth in the morning and at bedtime.   sertraline (ZOLOFT) 50 MG tablet Take 50 mg by mouth daily.   triamcinolone cream (KENALOG) 0.1 % 1 application daily as needed (Vaginal irritations).     Allergies:   Oxycodone, Aspirin, Imipenem, Statins, Cephalexin, Clindamycin/lincomycin, Contrast media [iodinated contrast media], Losartan potassium-hctz, Penicillins, and Quinolones   Social History   Socioeconomic History   Marital status: Married    Spouse name: Not on file   Number of children: Not on file    Years of education: Not on file   Highest education level: Not on file  Occupational History   Not on file  Tobacco Use   Smoking status: Former    Types: Cigarettes    Quit date: 1969    Years since quitting: 54.0   Smokeless tobacco: Never  Vaping Use   Vaping Use: Never used  Substance and Sexual Activity   Alcohol use: No   Drug use: No   Sexual activity: Not on file  Other Topics Concern   Not on file  Social History Narrative   Not on file   Social Determinants of Health   Financial Resource Strain: Not on file  Food Insecurity: Not on file  Transportation Needs: Not on file  Physical Activity: Not on file  Stress: Not on file  Social Connections: Not on file     Family History: The patient's family history includes Arthritis in her brother and sister; Diabetes in her brother and brother; Heart  disease in her mother; Neuropathy in her brother and sister; Stroke in her father.  ROS:   Please see the history of present illness.    All other systems reviewed and are negative.  EKGs/Labs/Other Studies Reviewed:    The following studies were reviewed today:  TEE:  1. Left ventricular ejection fraction, by estimation, is 55 to 60%. The  left ventricle has normal function. The left ventricle has no regional  wall motion abnormalities.   2. Right ventricular systolic function is normal. The right ventricular  size is normal.   3. Left atrial size was mildly dilated. No left atrial/left atrial  appendage thrombus was detected.   4. The mitral valve is normal in structure. Mild to moderate mitral valve  regurgitation.   5. The aortic valve is tricuspid. Aortic valve regurgitation is not  visualized.   6. There is mild (Grade II) plaque involving the descending aorta.   7. Evidence of atrial level shunting detected by color flow Doppler.  Agitated saline contrast bubble study was positive with shunting observed  within 3-6 cardiac cycles suggestive of interatrial  shunt. There is a  small patent foramen ovale.    EKG:  EKG is not ordered today.   Recent Labs: 03/05/2021: TSH 0.902 04/29/2021: Magnesium 2.1 06/17/2021: ALT 15 08/04/2021: BUN 8; Creatinine, Ser 0.59; Hemoglobin 14.4; Platelets 411; Potassium 4.6; Sodium 141  Recent Lipid Panel    Component Value Date/Time   CHOL 158 01/05/2021 0317   TRIG 70 01/05/2021 0317   HDL 44 01/05/2021 0317   CHOLHDL 3.6 01/05/2021 0317   VLDL 14 01/05/2021 0317   LDLCALC 100 (H) 01/05/2021 0317   Physical Exam:    VS:  BP 120/70 (BP Location: Left Arm, Patient Position: Sitting, Cuff Size: Normal)    Pulse 84    Ht 5\' 5"  (1.651 m)    Wt 171 lb (77.6 kg)    SpO2 95%    BMI 28.46 kg/m     Wt Readings from Last 3 Encounters:  09/28/21 171 lb (77.6 kg)  08/28/21 175 lb (79.4 kg)  08/04/21 177 lb 6.4 oz (80.5 kg)   General: Well developed, well nourished, NAD Neck: Negative for carotid bruits. No JVD Lungs:Clear to ausculation bilaterally. Breathing is unlabored. Cardiovascular: RRR with S1 S2. No murmurs Extremities: No edema.  Neuro: Alert and oriented. No focal deficits. No facial asymmetry. MAE spontaneously. Psych: Responds to questions appropriately with normal affect.    ASSESSMENT/PLAN:    PFO: Underwent PFO closure 08/28/21 with 65mm Amplatzer Talisman PFO occluder device. Medication plan for Plavix 75mg  PO QD for at least 6 months. She has known ASA allergy therefore will need to consider long term if indicated for stroke prevention. SBE for 6 months although the patient prefers to defer dental follow up until after the 6 month mark. She will call if AbX needed before that time (needs Plavix at least until 02/2022).  Plan one year limited echocardiogram with bubble.   Stroke/DVT: Pt was hospitlized found to have acute DVT and stroke per doppler and brain MRI. She was treated with short term AC for DVT. Tolerating Plavix well with on issues. Follows with neurology and PCP closely.    Musculoskeletal pain: Patient reports one brief episode of let, sided anterior chest pain. Reported as a fleeting episode while laying in the bed, also with anxiety symptoms. No exertional qualities. No associated symptoms of SOB, diaphoresis. Plan to follow for recurrence.    Medication Adjustments/Labs and Tests  Ordered: Current medicines are reviewed at length with the patient today.  Concerns regarding medicines are outlined above.  Orders Placed This Encounter  Procedures   ECHOCARDIOGRAM LIMITED BUBBLE STUDY   No orders of the defined types were placed in this encounter.   Patient Instructions  Medication Instructions:  Your physician recommends that you continue on your current medications as directed. Please refer to the Current Medication list given to you today.  *If you need a refill on your cardiac medications before your next appointment, please call your pharmacy*   Lab Work: NONE If you have labs (blood work) drawn today and your tests are completely normal, you will receive your results only by: [place] (if you have MyChart) OR A paper copy in the mail If you have any lab test that is abnormal or we need to change your treatment, we will call you to review the results.   Testing/Procedures: Your physician has requested that you have an LIMITED BUBBLE echocardiogram. Echocardiography is a painless test that uses sound waves to create images of your heart. It provides your doctor with information about the size and shape of your heart and how well your hearts chambers and valves are working. This procedure takes approximately one hour. There are no restrictions for this procedure.    Follow-Up: At [facility], you and your health needs are our priority.  As part of our continuing mission to provide you with exceptional heart care, we have created designated Provider Care Teams.  These Care Teams include your primary Cardiologist (physician) and Advanced  Practice Providers (APPs -  Physician Assistants and Nurse Practitioners) who all work together to provide you with the care you need, when you need it.  We recommend signing up for the patient portal called "MyChart".  Sign up information is provided on this After Visit Summary.  MyChart is used to connect with patients for Virtual Visits (Telemedicine).  Patients are able to view lab/test results, encounter notes, upcoming appointments, etc.  Non-urgent messages can be sent to your provider as well.   To learn more about what you can do with MyChart, go to [email or web address].    Your next appointment:   1 year(s)  The format for your next appointment:   In Person  Provider:   Kathyrn Drown, NP      Signed, Kathyrn Drown, NP  09/28/2021 11:16 AM    [place]

## 2021-09-28 ENCOUNTER — Ambulatory Visit (INDEPENDENT_AMBULATORY_CARE_PROVIDER_SITE_OTHER): Payer: Medicare HMO | Admitting: Cardiology

## 2021-09-28 ENCOUNTER — Other Ambulatory Visit: Payer: Self-pay

## 2021-09-28 ENCOUNTER — Encounter: Payer: Self-pay | Admitting: Cardiology

## 2021-09-28 VITALS — BP 120/70 | HR 84 | Ht 65.0 in | Wt 171.0 lb

## 2021-09-28 DIAGNOSIS — I2699 Other pulmonary embolism without acute cor pulmonale: Secondary | ICD-10-CM | POA: Diagnosis not present

## 2021-09-28 DIAGNOSIS — I6310 Cerebral infarction due to embolism of unspecified precerebral artery: Secondary | ICD-10-CM | POA: Diagnosis not present

## 2021-09-28 DIAGNOSIS — Q2112 Patent foramen ovale: Secondary | ICD-10-CM | POA: Diagnosis not present

## 2021-09-28 DIAGNOSIS — I749 Embolism and thrombosis of unspecified artery: Secondary | ICD-10-CM

## 2021-09-28 NOTE — Patient Instructions (Signed)
Medication Instructions:  Your physician recommends that you continue on your current medications as directed. Please refer to the Current Medication list given to you today.  *If you need a refill on your cardiac medications before your next appointment, please call your pharmacy*   Lab Work: NONE If you have labs (blood work) drawn today and your tests are completely normal, you will receive your results only by: [place] (if you have MyChart) OR A paper copy in the mail If you have any lab test that is abnormal or we need to change your treatment, we will call you to review the results.   Testing/Procedures: Your physician has requested that you have an LIMITED BUBBLE echocardiogram. Echocardiography is a painless test that uses sound waves to create images of your heart. It provides your doctor with information about the size and shape of your heart and how well your hearts chambers and valves are working. This procedure takes approximately one hour. There are no restrictions for this procedure.    Follow-Up: At [facility], you and your health needs are our priority.  As part of our continuing mission to provide you with exceptional heart care, we have created designated Provider Care Teams.  These Care Teams include your primary Cardiologist (physician) and Advanced Practice Providers (APPs -  Physician Assistants and Nurse Practitioners) who all work together to provide you with the care you need, when you need it.  We recommend signing up for the patient portal called "MyChart".  Sign up information is provided on this After Visit Summary.  MyChart is used to connect with patients for Virtual Visits (Telemedicine).  Patients are able to view lab/test results, encounter notes, upcoming appointments, etc.  Non-urgent messages can be sent to your provider as well.   To learn more about what you can do with MyChart, go to [email or web address].    Your next appointment:   1  year(s)  The format for your next appointment:   In Person  Provider:   Kathyrn Drown, NP

## 2021-10-07 ENCOUNTER — Encounter (HOSPITAL_COMMUNITY): Payer: Self-pay | Admitting: Hematology & Oncology

## 2021-10-14 ENCOUNTER — Ambulatory Visit: Payer: Medicare HMO | Admitting: Neurology

## 2021-10-14 ENCOUNTER — Encounter: Payer: Self-pay | Admitting: Neurology

## 2021-10-14 VITALS — BP 128/74 | HR 80 | Ht 65.0 in | Wt 170.0 lb

## 2021-10-14 DIAGNOSIS — Q2112 Patent foramen ovale: Secondary | ICD-10-CM | POA: Diagnosis not present

## 2021-10-14 DIAGNOSIS — R519 Headache, unspecified: Secondary | ICD-10-CM | POA: Insufficient documentation

## 2021-10-14 DIAGNOSIS — R0683 Snoring: Secondary | ICD-10-CM | POA: Insufficient documentation

## 2021-10-14 DIAGNOSIS — I6310 Cerebral infarction due to embolism of unspecified precerebral artery: Secondary | ICD-10-CM | POA: Diagnosis not present

## 2021-10-14 DIAGNOSIS — G4719 Other hypersomnia: Secondary | ICD-10-CM | POA: Insufficient documentation

## 2021-10-14 DIAGNOSIS — G43719 Chronic migraine without aura, intractable, without status migrainosus: Secondary | ICD-10-CM

## 2021-10-14 NOTE — Patient Instructions (Signed)
Screening for Sleep Apnea Sleep apnea is a condition in which breathing pauses or becomes shallow during sleep. Sleep apnea screening is a test to determine if you are at risk for sleep apnea. The test includes a series of questions. It will only takes a few minutes. Your health care provider may ask you to have this test in preparation for surgery or as part of a physical exam. What are the symptoms of sleep apnea? Common symptoms of sleep apnea include: Snoring. Waking up often at night. Daytime sleepiness. Pauses in breathing. Choking or gasping during sleep. Irritability. Forgetfulness. Trouble thinking clearly. Depression. Personality changes. Most people with sleep apnea do not know that they have it. What are the advantages of sleep apnea screening? Getting screened for sleep apnea can help: Ensure your safety. It is important for your health care providers to know whether or not you have sleep apnea, especially if you are having surgery or have other long-term (chronic) health conditions. Improve your health and allow you to get a better night's rest. Restful sleep can help you: Have more energy. Lose weight. Improve high blood pressure. Improve diabetes management. Prevent stroke. Prevent car accidents. What happens during the screening? Screening usually includes being asked a list of questions about your sleep quality. Some questions you may be asked include: Do you snore? Is your sleep restless? Do you have daytime sleepiness? Has a partner or spouse told you that you stop breathing during sleep? Have you had trouble concentrating or memory loss? What is your age? What is your neck circumference? To measure your neck, keep your back straight and gently wrap the tape measure around your neck. Put the tape measure at the middle of your neck, between your chin and collarbone. What is your sex assigned at birth? Do you have or are you being treated for high blood  pressure? If your screening test is positive, you are at risk for the condition. Further testing may be needed to confirm a diagnosis of sleep apnea. Where to find more information You can find screening tools online or at your health care clinic. For more information about sleep apnea screening and healthy sleep, visit these websites: Centers for Disease Control and Prevention: www.cdc.gov American Sleep Apnea Association: www.sleepapnea.org Contact a health care provider if: You think that you may have sleep apnea. Summary Sleep apnea screening can help determine if you are at risk for sleep apnea. It is important for your health care providers to know whether or not you have sleep apnea, especially if you are having surgery or have other chronic health conditions. You may be asked to take a screening test for sleep apnea in preparation for surgery or as part of a physical exam. This information is not intended to replace advice given to you by your health care provider. Make sure you discuss any questions you have with your health care provider. Document Revised: 08/15/2020 Document Reviewed: 08/15/2020 Elsevier Patient Education  2022 [employer]  

## 2021-10-14 NOTE — Progress Notes (Signed)
SLEEP MEDICINE CLINIC    Provider:  Larey Seat, MD  Primary Care Physician:  Lorene Dy, [address], [place] [address]     Referring Provider: Melvenia Beam, [address],  [place] [address]          Chief Complaint according to patient   Patient presents with:     New Patient (Initial Visit 10-14-2021)     Avg 7/8 hrs of sleep, may wake up one time during the night. When she wakes up she has headaches. Husband has witnessed apnea and snoring. Never had a SS.       HISTORY OF PRESENT ILLNESS:  [name] is a 76 y.o. 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  Chief concern according to patient :   The patient reports increased anxiety and migraines almost daily, weather changes promoting these as well as . Her husband thinks she snores, she sleeps better since PFO closure.  She wakes up often at 4-5 AM and feels already refreshed. She is presenting here tearful . Today the migraine is very severe.     [name]  has a past medical history of GAD, Anxiety, Dysphagia, HYPERTHYROIDISM, LYMPHADENOPATHY, MIGRAINE HEADACHE, OTHER HEADACHES, TENSION neck pain, rhinitis. 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 Neuropathy.    Sleep relevant medical history: goes to sleep with headaches, wakes up with headaches, neck pain.  CVA in April 2022 she was for a while headache free, gradually these came back. PFO closure reduced headaches further.  She wakes up with HA, not woken by HA.   Family medical /sleep history: no other family member on CPAP with OSA.    Social history:  Patient is retired from [employer] operational officer, retired in 2010 and lives in a household with spouse, there adult  2 sons and 6 grandchildren live in [place].  Pets are not present. Tobacco use: 1968.   ETOH use none ,  Caffeine intake in form of Coffee( /) Soda( /) Tea ( 1 glass a day, 1 cup a day) or energy drinks. Regular exercise in form of walking, gardening. .        Sleep habits are as follows: The patient's dinner time is between 5-6 PM. The patient goes to bed at 10 PM and continues to sleep for 3-4 hours, wakes for one bathroom breaks, the first time at 2 AM.   The preferred sleep position is laterally, with the support of 1 pillow.  Dreams are reportedly rare.  4-5  AM is the usual rise time. She is a "morning person"The patient wakes up spontaneously. He/ She reports not feeling refreshed or restored in AM, with symptoms such as dry mouth, morning headaches, and residual fatigue.  Naps are taken infrequently, lasting from 20 to 30 minutes and are refreshing.   Review of Systems: Out of a complete 14 system review, the patient complains of only the following symptoms, and all other reviewed systems are negative.:  Fatigue, sleepiness , snoring,  but patient reports headaches.    How likely are you to doze in the following situations: 0 = not likely, 1 = slight chance, 2 = moderate chance, 3 = high chance   Sitting and Reading? Watching Television? Sitting inactive in a public place (theater or  meeting)? As a passenger in a car for an hour without a break? Lying down in the afternoon when circumstances permit? Sitting and talking to someone? Sitting quietly after lunch without alcohol? In a car, while stopped for a few minutes in traffic?   Total = 3/ 24 points   FSS endorsed at 24/ 63 points.   Social History   Socioeconomic History   Marital status: Married    Spouse name: Not on file   Number of children: Not on file   Years of education: Not on file   Highest education level: Not on file  Occupational History   Not on file  Tobacco Use   Smoking status: Former    Types: Cigarettes    Quit date: 1969    Years since quitting: 54.1   Smokeless tobacco: Never   Vaping Use   Vaping Use: Never used  Substance and Sexual Activity   Alcohol use: No   Drug use: No   Sexual activity: Not on file  Other Topics Concern   Not on file  Social History Narrative   Not on file   Social Determinants of Health   Financial Resource Strain: Not on file  Food Insecurity: Not on file  Transportation Needs: Not on file  Physical Activity: Not on file  Stress: Not on file  Social Connections: Not on file    Family History  Problem Relation Age of Onset   Heart disease Mother    Stroke Father    Arthritis Sister    Diabetes Brother    Arthritis Brother    Neuropathy Brother    Neuropathy Sister    Diabetes Brother     Past Medical History:  Diagnosis Date   Anxiety    Dysphagia    HYPERTHYROIDISM    LYMPHADENOPATHY    MIGRAINE HEADACHE    Neuromuscular disorder ([place])    condition where muscle separates from bone left chest, quarter size, pain intermittent    Past Surgical History:  Procedure Laterality Date   BALLOON DILATION N/A 09/18/2020   Procedure: BALLOON DILATION;  Surgeon: Ronnette Juniper, MD;  Location: [facility] ENDOSCOPY;  Service: Gastroenterology;  Laterality: N/A;   BUBBLE STUDY  07/03/2021   Procedure: BUBBLE STUDY;  Surgeon: Lelon Perla, MD;  [facility] END[facility]COPY;  Service: Cardiovascular;;   ESOPHAGOGASTRODUODENOSCOPY (EGD) WITH PROPOFOL N/A 09/18/2020   Procedure: ESOPHAGOGASTRODUODENOSCOPY (EGD) WITH PROPOFOL;  Surgeon: Ronnette Juniper, MD;  Location: [facility] ENDOSCOPY;  Service: Gastroenterology;  Laterality: N/A;   EYE SURGERY Bilateral    Cataract L eye 1-20, R eye 3-20    FOREIGN BODY REMOVAL  09/18/2020   Procedure: FOREIGN BODY REMOVAL;  Surgeon: Ronnette Juniper, MD;  Location: [facility] ENDOSCOPY;  Service: Gastroenterology;;   [place] OVALE(PFO) CLOSURE N/A 08/28/2021   Procedure: PATENT FORAMEN OVALE (PFO) CLOSURE;  Surgeon: Sherren Mocha, MD;  Location: [facility] CV LAB;  Service: Cardiovascular;   Laterality: N/A;   REVERSE SHOULDER ARTHROPLASTY Right 03/16/2019   Procedure: REVERSE SHOULDER ARTHROPLASTY;  Surgeon: Netta Cedars, MD;  Location: [facility] ORS;  Service: Orthopedics;  Laterality: Right;  interscalene block   TEE WITHOUT CARDIOVERSION N/A 07/03/2021   Procedure: TRANSESOPHAGEAL ECHOCARDIOGRAM (TEE);  Surgeon: Lelon Perla, MD;  Location: [facility] ENDOSCOPY;  Service: Cardiovascular;  Laterality: N/A;   TUBAL LIGATION       Current Outpatient Medications on File Prior to Visit  Medication Sig Dispense Refill   acetaminophen (TYLENOL) 500 MG tablet Take 1,000  mg by mouth every 4 (four) hours as needed for headache.     Calcium Carbonate+Vitamin D 600-200 MG-UNIT TABS Take 1 tablet by mouth 2 (two) times daily.     carboxymethylcellulose (REFRESH PLUS) 0.5 % SOLN Place 1 drop into both eyes 3 (three) times daily as needed (dry eyes).     clopidogrel (PLAVIX) 75 MG tablet Take 1 tablet (75 mg total) by mouth daily. Start 5 days before PFO closure procedure 90 tablet 1   diphenhydrAMINE (BENADRYL) 25 MG tablet Take 25-50 mg by mouth daily as needed for allergies.     folic acid (FOLVITE) 1 MG tablet Take 2 tablets (2 mg total) by mouth daily.     gabapentin (NEURONTIN) 300 MG capsule TAKE 1 CAPSULE BY MOUTH IN  THE MORNING , 1 CAPSULE BY  MOUTH IN THE AFTERNOON ,  AND 2 CAPSULES BEFORE  BEDTIME 360 capsule 0   HYDROcodone-acetaminophen (NORCO/VICODIN) 5-325 MG tablet Take 1 tablet by mouth every 4 (four) hours as needed for severe pain.     levothyroxine (SYNTHROID) 112 MCG tablet Take 112 mcg by mouth daily before breakfast.     LORazepam (ATIVAN) 2 MG tablet Take 2 mg by mouth 3 (three) times daily as needed for anxiety.     methocarbamol (ROBAXIN) 750 MG tablet Take 750 mg by mouth 2 (two) times daily as needed for muscle spasms.     Multiple Vitamins-Minerals (MULTIVITAMIN WITH MINERALS) tablet Take 1 tablet by mouth daily.     nystatin (MYCOSTATIN/NYSTOP) powder Apply 1 application  topically 3 (three) times daily as needed (irritation).     polycarbophil (FIBERCON) 625 MG tablet Take 625 mg by mouth in the morning and at bedtime.     sertraline (ZOLOFT) 50 MG tablet Take 50 mg by mouth daily.     triamcinolone cream (KENALOG) 0.1 % 1 application daily as needed (Vaginal irritations).     No current facility-administered medications on file prior to visit.    Allergies  Allergen Reactions   Oxycodone Other (See Comments)    Hallucinations    Aspirin Other (See Comments)    Interferes with headaches   Imipenem Other (See Comments)    Warm and tingly all over   Statins Other (See Comments)    leg pain, muscle cramps   Cephalexin Other (See Comments)    Stomach cramps   Clindamycin/Lincomycin Nausea And Vomiting and Rash   Contrast Media [Iodinated Contrast Media] Rash   Losartan Potassium-Hctz Other (See Comments)    Unknown Reaction - pt is unaware of this    Penicillins Rash    Did it involve swelling of the face/tongue/throat, SOB, or low BP? No Did it involve sudden or severe rash/hives, skin peeling, or any reaction on the inside of your mouth or nose? Yes Did you need to seek medical attention at a hospital or doctor's office? No When did it last happen?      2011 If all above answers are "NO", may proceed with cephalosporin use.    Quinolones Rash    Physical exam:  Today's Vitals   10/14/21 1001  BP: 128/74  Pulse: 80  Weight: 170 lb (77.1 kg)  Height: 5\' 5"  (1.651 m)   Body mass index is 28.29 kg/m.   Wt Readings from Last 3 Encounters:  10/14/21 170 lb (77.1 kg)  09/28/21 171 lb (77.6 kg)  08/28/21 175 lb (79.4 kg)     Ht Readings from Last 3 Encounters:  10/14/21 5\' 5"  (  1.651 m)  09/28/21 5\' 5"  (1.651 m)  08/28/21 5\' 5"  (1.651 m)      General: The patient is awake, alert and appears not in acute distress.  The patient is well groomed. Head: Normocephalic, atraumatic. Neck is supple. Mallampati 1-2, lateral crowding.  neck  circumference:15 inches . Nasal airflow congested- she is crying    Retrognathia is mildly present  seen.  Dental status: natural  Cardiovascular:  Regular rate and cardiac rhythm by pulse,  without distended neck veins. Respiratory: Lungs are clear to auscultation.  Skin:  Without evidence of ankle edema, or rash. Trunk: The patient's posture is erect.   Neurologic exam : The patient is awake and alert, oriented to place and time.   Memory subjective described as intact.  Attention span & concentration ability appears normal.  Speech is fluent,  but she is crying  Mood and affect are appropriate.   Cranial nerves: no loss of smell or taste reported  Pupils are equal and briskly reactive to light. Funduscopic exam deferred. Status post cataract surgery, .  Extraocular movements in vertical and horizontal planes were intact and without nystagmus. No Diplopia. Visual fields by finger perimetry are intact. Hearing was intact to soft voice and finger rubbing.    Facial sensation intact to fine touch.  Facial motor strength is symmetric and tongue and uvula move midline.  Neck ROM : rotation, tilt and flexion extension were normal for age and shoulder shrug was symmetrical.    Motor exam:  Symmetric bulk, tone and ROM.   Normal tone without cog- wheeling, symmetric grip strength, which is reduced due to arthritis pain.  .   Sensory:  Fine touch and vibration were impaired- less sensitive on the left leg, completely numb in the left foot, both feet have neuropathic decreased sensation.   Proprioception tested in the upper extremities was normal.   Coordination: Rapid alternating movements in the fingers/hands were of normal speed.  She is status post rotator cuff surgery- twice -on the right -  The Finger-to-nose maneuver was intact without evidence of ataxia, dysmetria or tremor.   Gait and station: Patient could rise unassisted from a seated position, walked without assistive device.   Stance is of normal width/ base and the patient turned with 4 steps.  Toe and heel walk were deferred.  Deep tendon reflexes: in the  upper  extremities are symmetric and intact.  No patella  DTR on the right  Babinski response was deferred.      After spending a total time of  45  minutes : this face to face and additional time for physical and neurologic examination, review of laboratory studies,  personal review of imaging studies, reports and results of other testing and review of referral information / records as far as provided in visit, I have established the following assessments:  1) sleep related headaches can be tension neck pain, radiating to the occipital region and top of the head  and migraine headaches with photophobia, but no nausea. Menstrual onset, worsening over childbearing years. No icecream headaches in childhood, no travel sickness.   2) snoring per husband , who has cognitive impairment. She feels she sleeps better since PFO closure.     My Plan is to proceed with:  1) SPLIT at AHI 15 if medicare allows- sensible mask fitting , may not have apnea.  2) HST if PSG is denied by insurance    I would like to thank Lorene Dy, MD and Jaynee Eagles,  [address], [address] [place],  [place] [address] for allowing me to meet with and to take care of this pleasant patient.   Beverley Fiedler I plan to follow up either personally or through our NP within : 2-4 months.    Electronically signed by: Larey Seat, MD 10/14/2021 10:18 AM  [facility] Board certified by The [employer] of Sleep Medicine and Diplomate of the [employer] of Sleep Medicine. Board certified In Neurology through the [place], Fellow of the [employer] of Neurology. Medical Director of [facility].

## 2021-11-02 ENCOUNTER — Encounter: Payer: Self-pay | Admitting: Neurology

## 2021-11-03 ENCOUNTER — Encounter (HOSPITAL_COMMUNITY): Payer: Self-pay | Admitting: Hematology & Oncology

## 2021-11-13 ENCOUNTER — Telehealth: Payer: Self-pay | Admitting: Neurology

## 2021-11-13 NOTE — Telephone Encounter (Signed)
Pt called stating that she is needing a refill request for her gabapentin (NEURONTIN) 300 MG capsule sent to [facility] because the are with [facility].

## 2021-11-16 ENCOUNTER — Emergency Department (HOSPITAL_COMMUNITY)
Admission: EM | Admit: 2021-11-16 | Discharge: 2021-11-16 | Disposition: A | Discharge status: Home / Self Care | Payer: Medicare HMO | Attending: Emergency Medicine | Admitting: Emergency Medicine

## 2021-11-16 ENCOUNTER — Other Ambulatory Visit: Payer: Self-pay

## 2021-11-16 ENCOUNTER — Telehealth: Payer: Self-pay | Admitting: Neurology

## 2021-11-16 ENCOUNTER — Encounter (HOSPITAL_COMMUNITY): Payer: Self-pay

## 2021-11-16 DIAGNOSIS — F419 Anxiety disorder, unspecified: Secondary | ICD-10-CM | POA: Insufficient documentation

## 2021-11-16 DIAGNOSIS — R519 Headache, unspecified: Secondary | ICD-10-CM | POA: Diagnosis not present

## 2021-11-16 DIAGNOSIS — H53149 Visual discomfort, unspecified: Secondary | ICD-10-CM | POA: Insufficient documentation

## 2021-11-16 LAB — CBC WITH DIFFERENTIAL/PLATELET
Abs Immature Granulocytes: 0.03 10*3/uL (ref 0.00–0.07)
Basophils Absolute: 0 10*3/uL (ref 0.0–0.1)
Basophils Relative: 0 %
Eosinophils Absolute: 0.1 10*3/uL (ref 0.0–0.5)
Eosinophils Relative: 2 %
HCT: 41 % (ref 36.0–46.0)
Hemoglobin: 14.1 g/dL (ref 12.0–15.0)
Immature Granulocytes: 1 %
Lymphocytes Relative: 23 %
Lymphs Abs: 1.3 10*3/uL (ref 0.7–4.0)
MCH: 31.7 pg (ref 26.0–34.0)
MCHC: 34.4 g/dL (ref 30.0–36.0)
MCV: 92.1 fL (ref 80.0–100.0)
Monocytes Absolute: 0.5 10*3/uL (ref 0.1–1.0)
Monocytes Relative: 9 %
Neutro Abs: 3.7 10*3/uL (ref 1.7–7.7)
Neutrophils Relative %: 65 %
Platelets: 382 10*3/uL (ref 150–400)
RBC: 4.45 MIL/uL (ref 3.87–5.11)
RDW: 12.8 % (ref 11.5–15.5)
WBC: 5.7 10*3/uL (ref 4.0–10.5)
nRBC: 0 % (ref 0.0–0.2)

## 2021-11-16 LAB — BASIC METABOLIC PANEL
Anion gap: 8 (ref 5–15)
BUN: 11 mg/dL (ref 8–23)
CO2: 22 mmol/L (ref 22–32)
Calcium: 9.9 mg/dL (ref 8.9–10.3)
Chloride: 106 mmol/L (ref 98–111)
Creatinine, Ser: 0.41 mg/dL — ABNORMAL LOW (ref 0.44–1.00)
GFR, Estimated: >60 mL/min (ref >60)
Glucose, Bld: 122 mg/dL — ABNORMAL HIGH (ref 70–99)
Potassium: 3.7 mmol/L (ref 3.5–5.1)
Sodium: 136 mmol/L (ref 135–145)

## 2021-11-16 MED ORDER — PROCHLORPERAZINE EDISYLATE 10 MG/2ML IJ SOLN
10.0000 mg | Freq: Once | INTRAMUSCULAR | Status: AC
  Administered 2021-11-16: 10 mg via INTRAVENOUS
  Filled 2021-11-16: qty 2

## 2021-11-16 MED ORDER — SODIUM CHLORIDE 0.9 % IV BOLUS
1000.0000 mL | Freq: Once | INTRAVENOUS | Status: AC
  Administered 2021-11-16: 1000 mL via INTRAVENOUS

## 2021-11-16 MED ORDER — GABAPENTIN 300 MG PO CAPS: ORAL_CAPSULE | ORAL | 0 refills | Status: DC

## 2021-11-16 MED ORDER — DIPHENHYDRAMINE HCL 50 MG/ML IJ SOLN
25.0000 mg | Freq: Once | INTRAMUSCULAR | Status: AC
  Administered 2021-11-16: 25 mg via INTRAVENOUS
  Filled 2021-11-16: qty 1

## 2021-11-16 NOTE — ED Provider Notes (Signed)
[facility] DEPT Provider Note   CSN: [id number] Arrival date & time: 11/16/21  0954     History  Chief Complaint  Patient presents with   Headache   Anxiety    [name] is a 76 y.o. female.  Here with her typical migraine headache that is slightly worse than normal.  Feeling anxious as well.  Having photophobia.  Denies any vision loss, weakness, speech changes, numbness or tingling.  History of stroke on Plavix.  Denies any chest pain or shortness of breath or abdominal pain.  Has not been sleeping well.  The history is provided by the patient.  Headache Pain location:  Generalized Radiates to:  Does not radiate Timing:  Intermittent Progression:  Waxing and waning Chronicity:  Recurrent Similar to prior headaches: yes   Context: bright light   Relieved by:  Nothing Worsened by:  Nothing Associated symptoms: blurred vision and photophobia   Associated symptoms: no abdominal pain, no back pain, no congestion, no cough, no diarrhea, no dizziness, no drainage, no ear pain, no eye pain, no facial pain, no fatigue, no fever, no focal weakness, no hearing loss, no loss of balance, no myalgias, no nausea, no near-syncope, no neck pain, no neck stiffness, no numbness, no paresthesias, no seizures, no sinus pressure, no sore throat, no swollen glands, no syncope, no tingling, no URI, no visual change, no vomiting and no weakness   Risk factors comment:  Hx stroke, migraines Anxiety Associated symptoms include headaches. Pertinent negatives include no abdominal pain.      Home Medications Prior to Admission medications   Medication Sig Start Date End Date Taking? Authorizing Provider  acetaminophen (TYLENOL) 500 MG tablet Take 1,000 mg by mouth every 4 (four) hours as needed for headache.    [provider]  Calcium Carbonate+Vitamin D 600-200 MG-UNIT TABS Take 1 tablet by mouth 2 (two) times daily.    [provider]   carboxymethylcellulose (REFRESH PLUS) 0.5 % SOLN Place 1 drop into both eyes 3 (three) times daily as needed (dry eyes).    [provider]  clopidogrel (PLAVIX) 75 MG tablet Take 1 tablet (75 mg total) by mouth daily. Start 5 days before PFO closure procedure 08/04/21   Sherren Mocha, MD  diphenhydrAMINE (BENADRYL) 25 MG tablet Take 25-50 mg by mouth daily as needed for allergies.    [provider]  folic acid (FOLVITE) 1 MG tablet Take 2 tablets (2 mg total) by mouth daily. 02/25/21   Georgette Shell, MD  gabapentin (NEURONTIN) 300 MG capsule TAKE 1 CAPSULE BY MOUTH IN  THE MORNING , 1 CAPSULE BY  MOUTH IN THE AFTERNOON ,  AND 2 CAPSULES BEFORE  BEDTIME 11/16/21   Dohmeier, Asencion Partridge, MD  HYDROcodone-acetaminophen (NORCO/VICODIN) 5-325 MG tablet Take 1 tablet by mouth every 4 (four) hours as needed for severe pain.    [provider]  levothyroxine (SYNTHROID) 112 MCG tablet Take 112 mcg by mouth daily before breakfast.    [provider]  LORazepam (ATIVAN) 2 MG tablet Take 2 mg by mouth 3 (three) times daily as needed for anxiety.    [provider]  methocarbamol (ROBAXIN) 750 MG tablet Take 750 mg by mouth 2 (two) times daily as needed for muscle spasms. 06/10/21   [provider]  Multiple Vitamins-Minerals (MULTIVITAMIN WITH MINERALS) tablet Take 1 tablet by mouth daily.    [provider]  nystatin (MYCOSTATIN/NYSTOP) powder Apply 1 application topically 3 (three) times daily  as needed (irritation).    [provider]  polycarbophil (FIBERCON) 625 MG tablet Take 625 mg by mouth in the morning and at bedtime.    [provider]  sertraline (ZOLOFT) 50 MG tablet Take 50 mg by mouth daily.    [provider]  triamcinolone cream (KENALOG) 0.1 % 1 application daily as needed (Vaginal irritations).    [provider]      Allergies    Oxycodone, Aspirin, Imipenem, Statins, Cephalexin,  Clindamycin/lincomycin, Contrast media [iodinated contrast media], Losartan potassium-hctz, Penicillins, and Quinolones    Review of Systems   Review of Systems  Constitutional:  Negative for fatigue and fever.  HENT:  Negative for congestion, ear pain, hearing loss, postnasal drip, sinus pressure and sore throat.   Eyes:  Positive for blurred vision and photophobia. Negative for pain.  Respiratory:  Negative for cough.   Cardiovascular:  Negative for syncope and near-syncope.  Gastrointestinal:  Negative for abdominal pain, diarrhea, nausea and vomiting.  Musculoskeletal:  Negative for back pain, myalgias, neck pain and neck stiffness.  Neurological:  Positive for headaches. Negative for dizziness, focal weakness, seizures, weakness, numbness, paresthesias and loss of balance.   Physical Exam Updated Vital Signs BP 136/77    Pulse 78    Temp 98.2 F (36.8 C) (Oral)    Resp (!) 26    Ht 5\' 5"  (1.651 m)    Wt 77.1 kg    SpO2 97%    BMI 28.29 kg/m  Physical Exam Vitals and nursing note reviewed.  Constitutional:      General: She is not in acute distress.    Appearance: She is well-developed. She is not ill-appearing.  HENT:     Head: Normocephalic and atraumatic.     Mouth/Throat:     Mouth: Mucous membranes are moist.  Eyes:     General: No visual field deficit.    Extraocular Movements: Extraocular movements intact.     Right eye: Normal extraocular motion and no nystagmus.     Left eye: Normal extraocular motion and no nystagmus.     Conjunctiva/sclera: Conjunctivae normal.     Pupils: Pupils are equal, round, and reactive to light.     Right eye: Pupil is reactive.     Left eye: Pupil is reactive.  Cardiovascular:     Rate and Rhythm: Normal rate and regular rhythm.     Heart sounds: Normal heart sounds. No murmur heard. Pulmonary:     Effort: Pulmonary effort is normal. No respiratory distress.     Breath sounds: Normal breath sounds.  Abdominal:     Palpations: Abdomen  is soft.     Tenderness: There is no abdominal tenderness.  Musculoskeletal:        General: No swelling.     Cervical back: Normal range of motion and neck supple.  Skin:    General: Skin is warm and dry.     Capillary Refill: Capillary refill takes less than 2 seconds.  Neurological:     Mental Status: She is alert and oriented to person, place, and time.     Cranial Nerves: No cranial nerve deficit, dysarthria or facial asymmetry.     Sensory: No sensory deficit.     Motor: No weakness.     Comments: 5+ out of 5 strength throughout, normal sensation, no drift, normal visual fields, no visual field deficit,   Psychiatric:        Mood and Affect: Mood normal. Mood is not anxious.  ED Results / Procedures / Treatments   Labs (all labs ordered are listed, but only abnormal results are displayed) Labs Reviewed  BASIC METABOLIC PANEL - Abnormal; Notable for the following components:      Result Value   Glucose, Bld 122 (*)    Creatinine, Ser 0.41 (*)    All other components within normal limits  CBC WITH DIFFERENTIAL/PLATELET    EKG None  Radiology No results found.  Procedures Procedures    Medications Ordered in ED Medications  prochlorperazine (COMPAZINE) injection 10 mg (10 mg Intravenous Given 11/16/21 1031)  diphenhydrAMINE (BENADRYL) injection 25 mg (25 mg Intravenous Given 11/16/21 1031)  sodium chloride 0.9 % bolus 1,000 mL (1,000 mLs Intravenous Bolus 11/16/21 1033)    ED Course/ Medical Decision Making/ A&P                           Medical Decision Making Amount and/or Complexity of Data Reviewed Labs: ordered.  Risk Prescription drug management.   [name] is here with headache and anxiety.  Normal vitals.  No fever.  History of migraines and stroke and anxiety.  Overall appears well.  Normal neurologic exam.  No stroke symptoms.  Having photophobia, headache similar to prior headaches.  Has not been sleeping well.  No suicidal homicidal  ideation.  Anxiety is improving.  Feels like this is like her typical bad migraines.  Suspect that differential is limited and think that this is migraine type headache.  She has not been sleeping well which I think is her trigger.  I have no suspicion for stroke, head bleed or other acute process at this time.  Headache cocktail given with IV Compazine and Benadryl.  Will give fluid bolus.  She has had anemia in the past and we will check basic labs.  Per my review and interpretation of labs patient has no significant anemia, electrolyte abnormality, kidney injury.  No leukocytosis.  On reevaluation headache is much improved.  She is resting well.  Discharged and will have her follow-up with primary care doctor and neurology.  This chart was dictated using voice recognition software.  Despite best efforts to proofread,  errors can occur which can change the documentation meaning.         Final Clinical Impression(s) / ED Diagnoses Final diagnoses:  Bad headache    Rx / DC Orders ED Discharge Orders     None         Lennice Sites, DO 11/16/21 1122

## 2021-11-16 NOTE — ED Triage Notes (Signed)
Patient c/o right sided headache  and anxiety since this AM. Patient also c/o blurred vision. Patient also reports sensitivity to light. Patient reports a history of migraines and anxiety.

## 2021-11-16 NOTE — ED Notes (Signed)
Pt states understanding of dc instructions, importance of follow up. Pt denies questions or concerns and declined transportation assistance upon dc. Pt ambulated w/ a steady gait w/o need for assistance. No belongings left in room upon dc. ? ?

## 2021-11-16 NOTE — Telephone Encounter (Signed)
Called pt back to get more information. Hot/cold episodes have been going on for years but have worsened in the last week. She has f/u with PCP this afternoon.   I went over signs stroke: sudden numbness or weakness, sudden confusion, sudden trouble seeing, sudden trouble walking, sudden severe headache. She reports she woke up with severe headache last night. When she got up this am, she could not see out of R eye x30 min. Advised she should proceed to ER asap for immediate evaluation/treatment. Need to r/o stroke. She verbalized understanding and will have husband bring her.

## 2021-11-16 NOTE — Telephone Encounter (Signed)
Refill sent.

## 2021-11-16 NOTE — Telephone Encounter (Signed)
Pt has called to report that she is having hot & cold episodes and states she doesn't know if it is from her mini strokes from last year.  Right away pt was advised that if she feels what she is experiencing is relayed to a stroke she needs to right away go to the ED instead of waiting for a call.  Pt states she will keep her current appointment and decide what to do later.  Pt was again strongly encouraged to not sit around if she feels like she is having stroke like symptoms.  Pt then ended the call. This is FYI to POD 1

## 2021-11-17 NOTE — Telephone Encounter (Signed)
Do we know  if this HST was approved.? CD

## 2021-11-23 ENCOUNTER — Ambulatory Visit (INDEPENDENT_AMBULATORY_CARE_PROVIDER_SITE_OTHER): Payer: Medicare HMO | Admitting: Neurology

## 2021-11-23 DIAGNOSIS — I6310 Cerebral infarction due to embolism of unspecified precerebral artery: Secondary | ICD-10-CM

## 2021-11-23 DIAGNOSIS — R0683 Snoring: Secondary | ICD-10-CM

## 2021-11-23 DIAGNOSIS — G4733 Obstructive sleep apnea (adult) (pediatric): Secondary | ICD-10-CM | POA: Diagnosis not present

## 2021-11-23 DIAGNOSIS — Q2112 Patent foramen ovale: Secondary | ICD-10-CM

## 2021-11-23 DIAGNOSIS — R519 Headache, unspecified: Secondary | ICD-10-CM

## 2021-11-25 NOTE — Progress Notes (Signed)
? ? ? ? ?  ?  ?[facility] ?  ?HOME SLEEP TEST REPORT ( by Watch PAT)   ?STUDY DATE:  11-24-2021 ? ?ORDERING CLINICIAN: ?REFERRING CLINICIAN: Dr. Jaynee Eagles, MD  ?  ?CLINICAL INFORMATION/HISTORY:[name] is a 76 y.o. 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  ?Chief concern :   The patient reports increased anxiety and migraines almost daily, weather changes promoting these as well as . ?Her husband thinks she snores, but she sleeps better since her PFO closure.  ?She wakes up often at 4-5 AM and feels already refreshed. She is presenting today with migraine , very severe. ?  ?Epworth sleepiness score: 3/24. ?  ?BMI: 28.3 kg/m? ?  ?Neck Circumference: 15" ?  ?FINDINGS: ?  ?Sleep Summary: ?  ?Total Recording Time (hours, min): 7 h 58 m     ?  ?Total Sleep Time (hours, min): 6 h 50 m              ?  ?Percent REM (%):   12.2%                                   ?  ?Respiratory Indices: ?  ?pAHI (per hour):  31/h                         ?  ?REM pAHI:      39/h                                      ?  ?NREM pAHI:   29.9/h                      ?  ?Supine AHI:    39.9/h and 26.8/h in right sided sleep position.   ? ?Snoring was present for 13% of sleep time, mean Volume of 41 dB.                                             ?  ?Oxygen Saturation Statistics:     ?  ?O2 Saturation Range (%): 79-98% mean saturation was 91%                                      ?  ?O2 Saturation (minutes) <89%:  6.6 minutes       ?  ?Pulse Rate Statistics: ?  ?Pulse Range:  50-96 bpm, mean rate 65 bpm.               ?  ?IMPRESSION:  This HST confirms the presence of sever sleep apnea with REM accentuation and with supine accentuation of AHI. ?No significant hypoxia. ? ?  ?RECOMMENDATION: This degree of apnea will require CPAP therapy- I like to start therapy and will  prescribe an autotitration device, ResMed ,setting of 6-16 cm  water, 2 cm EPR and mask of choice , heated humidification. ? ?Please let the patient know that she could use a dental device ,  but her AHI is likely less reduced as it would be by PAP therapy.  ?BMI would also allow for a Inspire device.    ? ?  ?INTERPRETING PHYSICIAN: ? ? Larey Seat, MD  ? ?Medical Director of [facility] at [employer].  ? ? ? ? ? ? ? ? ? ? ? ? ? ? ? ? ? ?

## 2021-11-26 ENCOUNTER — Encounter: Payer: Self-pay | Admitting: Neurology

## 2021-11-26 NOTE — Procedures (Signed)
? ?  ?  ?[facility] ?  ?HOME SLEEP TEST REPORT ( by Watch PAT)   ?STUDY DATE:  11-24-2021 ? ?ORDERING CLINICIAN: ?REFERRING CLINICIAN: Dr. Jaynee Eagles, MD  ?  ?CLINICAL INFORMATION/HISTORY:[name] is a 76 y.o. 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  ?Chief concern :   The patient reports increased anxiety and migraines almost daily, weather changes promoting these as well as . ?Her husband thinks she snores, but she sleeps better since her PFO closure.  ?She wakes up often at 4-5 AM and feels already refreshed. She is presenting today with migraine , very severe. ?  ?Epworth sleepiness score: 3/24. ?  ?BMI: 28.3 kg/m? ?  ?Neck Circumference: 15" ?  ?FINDINGS: ?  ?Sleep Summary: ?  ?Total Recording Time (hours, min): 7 h 58 m     ?  ?Total Sleep Time (hours, min): 6 h 50 m              ?  ?Percent REM (%):   12.2%                                   ?  ?Respiratory Indices: ?  ?pAHI (per hour):  31/h                         ?  ?REM pAHI:      39/h                                      ?  ?NREM pAHI:   29.9/h                      ?  ?Supine AHI:    39.9/h and 26.8/h in right sided sleep position.   ? ?Snoring was present for 13% of sleep time, mean Volume of 41 dB.                                             ?  ?Oxygen Saturation Statistics:     ?  ?O2 Saturation Range (%): 79-98% mean saturation was 91%                                      ?  ?O2 Saturation (minutes) <89%:  6.6 minutes       ?  ?Pulse Rate Statistics: ?  ?Pulse Range:  50-96 bpm, mean rate 65 bpm.               ?  ?IMPRESSION:  This HST confirms the presence of sever sleep apnea with REM accentuation and with supine accentuation of AHI. ?No significant hypoxia. ? ?  ?RECOMMENDATION: This degree of apnea will require CPAP therapy- I like to start therapy and will  prescribe an autotitration device, ResMed ,setting of 6-16 cm water,  2 cm EPR and mask of choice , heated humidification. ? ?Please let the patient know that she could use a dental device , but her  AHI is likely less reduced as it would be by PAP therapy.  ?BMI would also allow for a Inspire device.    ? ?  ?INTERPRETING PHYSICIAN: ? ? Larey Seat, MD  ? ?Medical Director of [facility] at [employer].  ? ? ? ? ? ? ? ? ? ? ? ? ? ? ? ? ?

## 2021-11-26 NOTE — Progress Notes (Signed)
IMPRESSION:  This HST confirms the presence of sever sleep apnea with REM accentuation and with supine accentuation of AHI. ?No significant hypoxia. ? ?RECOMMENDATION:  to be significantly reduced this degree of apnea will require CPAP therapy- I like to start therapy and will  prescribe an autotitration device, ResMed ,setting of 6-16 cm water, 2 cm EPR and mask of choice , heated humidification. ?PLEASE LET ME KNOW IF THIS IS OK WITH THE PATIENT ?? ?Please let the patient know that she could use a dental device , but her AHI is likely less reduced as it would be by PAP therapy.  ?BMI would also allow for a Inspire device.    ??

## 2021-11-26 NOTE — Addendum Note (Signed)
Addended by: Larey Seat on: 11/26/2021 04:57 PM ? ? Modules accepted: Orders ? ?

## 2021-11-30 NOTE — Telephone Encounter (Signed)
Called patient to discuss sleep study results. No answer at this time. LVM for the patient to call back.   

## 2021-11-30 NOTE — Telephone Encounter (Signed)
-----   Message from Larey Seat, MD sent at 11/26/2021  4:57 PM EST ----- ?IMPRESSION:  This HST confirms the presence of sever sleep apnea with REM accentuation and with supine accentuation of AHI. ?No significant hypoxia. ? ?RECOMMENDATION:  to be significantly reduced this degree of apnea will require CPAP therapy- I like to start therapy and will  prescribe an autotitration device, ResMed ,setting of 6-16 cm water, 2 cm EPR and mask of choice , heated humidification. ?PLEASE LET ME KNOW IF THIS IS OK WITH THE PATIENT ?? ?Please let the patient know that she could use a dental device , but her AHI is likely less reduced as it would be by PAP therapy.  ?BMI would also allow for a Inspire device.    ?? ?

## 2021-11-30 NOTE — Telephone Encounter (Signed)
Pt returned call. I advised pt that Dr. Brett Fairy reviewed their sleep study results and found that pt has severe sleep apnea. Dr. Brett Fairy recommends that pt starts auto CPAP.  ?Pt became tearful and states that she doesn't feel she will be able to tolerate the CPAP. She immediately asked about inspire or dental device. I advised the patient insurance will require that she has at least attempted a trial with CPAP. I advised she may be surprised and find that she does tolerate CPAP.  ?I reviewed PAP compliance expectations with the pt. Pt is agreeable to starting a CPAP. I advised pt that an order will be sent to a DME, [facility], and [facility] will call the pt within about one week after they file with the pt's insurance. [facility] will show the pt how to use the machine, fit for masks, and troubleshoot the CPAP if needed. A follow up appt was made for insurance purposes with Dr. Brett Fairy on May 10,2023 at 3:30 pm. Pt verbalized understanding to arrive 15 minutes early and bring their CPAP. A letter with all of this information in it will be mailed to the pt as a reminder. I verified with the pt that the address we have on file is correct. Pt verbalized understanding of results. Pt had no questions at this time but was encouraged to call back if questions arise. I have sent the order to [facility] and have received confirmation that they have received the order. ? ?

## 2021-12-02 ENCOUNTER — Encounter: Payer: Self-pay | Admitting: Adult Health

## 2021-12-02 ENCOUNTER — Ambulatory Visit: Payer: Medicare HMO | Admitting: Adult Health

## 2021-12-02 VITALS — BP 131/73 | HR 62 | Ht 65.0 in | Wt 171.0 lb

## 2021-12-02 DIAGNOSIS — G4733 Obstructive sleep apnea (adult) (pediatric): Secondary | ICD-10-CM | POA: Diagnosis not present

## 2021-12-02 DIAGNOSIS — I639 Cerebral infarction, unspecified: Secondary | ICD-10-CM | POA: Diagnosis not present

## 2021-12-02 DIAGNOSIS — R519 Headache, unspecified: Secondary | ICD-10-CM

## 2021-12-02 NOTE — Progress Notes (Signed)
? ? ?PATIENT: [name] ?DOB: 1946-08-27 ? ?REASON FOR VISIT: follow up ?HISTORY FROM: patient ?PRIMARY NEUROLOGIST:  ? ?HISTORY OF PRESENT ILLNESS: ?Today 12/02/21: ? ?Ms. Nelton is a 76 year old female with a history of stroke, daily/migraine headaches and obstructive sleep apnea on CPAP. ? ?She reports that she has Daily dull headaches but only 1 or 2 severe headaches a week. Some weeks she doesn't have a severe one at all. Will use hydrocodone for the severe ones.  ? ?At the last visit Dr. Brett Fairy prescribed gabapentin for her neuropathy.  She feels that this is working well.  She reports that her feet feel cold at night- uses double socks or heating blanket.  Denies any burning or tingling sensations ? ? CVA: Had PFO closure.  Followed by Dr. Burt Knack. Had repeat MRI brain 08/2021 ? 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 Reports having some intermittent chest pain.  ? ? ?REVIEW OF SYSTEMS: Out of a complete 14 system review of symptoms, the patient complains only of the following symptoms, and all other reviewed systems are negative. ? ?ALLERGIES: ?Allergies  ?Allergen Reactions  ? Oxycodone Other (See Comments)  ?  Hallucinations   ? Aspirin Other (See Comments)  ?  Interferes with headaches  ? Imipenem Other (See Comments)  ?  Warm and tingly all over  ? Statins Other (See Comments)  ?  leg pain, muscle cramps  ? Cephalexin Other (See Comments)  ?  Stomach cramps  ? Clindamycin/Lincomycin Nausea And Vomiting and Rash  ? Contrast Media [Iodinated Contrast Media] Rash  ? Losartan Potassium-Hctz Other (See Comments)  ?  Unknown Reaction - pt is unaware of this   ? Methylprednisolone Rash  ?  Rash on face  ? Oxybutynin Rash  ?  Rash on face   ? Penicillins Rash  ?  Did it involve swelling of the face/tongue/throat, SOB, or low BP? No ?Did it involve sudden or severe rash/hives, skin peeling, or any reaction on the inside of your mouth or nose? Yes ?Did you need to seek medical attention at a  hospital or doctor's office? No ?When did it last happen?      2011 ?If all above answers are "NO", may proceed with cephalosporin use. ?  ? Quinolones Rash  ? ? ?HOME MEDICATIONS: ?Outpatient Medications Prior to Visit  ?Medication Sig Dispense Refill  ? acetaminophen (TYLENOL) 500 MG tablet Take 1,000 mg by mouth every 4 (four) hours as needed for headache.    ? Calcium Carbonate+Vitamin D 600-200 MG-UNIT TABS Take 1 tablet by mouth 2 (two) times daily.    ? carboxymethylcellulose (REFRESH PLUS) 0.5 % SOLN Place 1 drop into both eyes 3 (three) times daily as needed (dry eyes).    ? clopidogrel (PLAVIX) 75 MG tablet Take 1 tablet (75 mg total) by mouth daily. Start 5 days before PFO closure procedure 90 tablet 1  ? diphenhydrAMINE (BENADRYL) 25 MG tablet Take 25-50 mg by mouth daily as needed for allergies.    ? folic acid (FOLVITE) 1 MG tablet Take 2 tablets (2 mg total) by mouth daily.    ? gabapentin (NEURONTIN) 300 MG capsule TAKE 1 CAPSULE BY MOUTH IN  THE MORNING , 1 CAPSULE BY  MOUTH IN THE AFTERNOON ,  AND 2 CAPSULES BEFORE  BEDTIME 360 capsule 0  ? HYDROcodone-acetaminophen (NORCO/VICODIN) 5-325 MG tablet Take 1 tablet by mouth every 4 (four) hours as needed for severe pain.    ? levothyroxine (SYNTHROID) 112 MCG  tablet Take 112 mcg by mouth daily before breakfast.    ? LORazepam (ATIVAN) 2 MG tablet Take 2 mg by mouth 3 (three) times daily as needed for anxiety.    ? methocarbamol (ROBAXIN) 750 MG tablet Take 750 mg by mouth 2 (two) times daily as needed for muscle spasms.    ? Multiple Vitamins-Minerals (MULTIVITAMIN WITH MINERALS) tablet Take 1 tablet by mouth daily.    ? nystatin (MYCOSTATIN/NYSTOP) powder Apply 1 application topically 3 (three) times daily as needed (irritation).    ? polycarbophil (FIBERCON) 625 MG tablet Take 625 mg by mouth in the morning and at bedtime.    ? sertraline (ZOLOFT) 50 MG tablet Take 50 mg by mouth daily.    ? triamcinolone cream (KENALOG) 0.1 % 1 application daily as  needed (Vaginal irritations).    ? ?No facility-administered medications prior to visit.  ? ? ?PAST MEDICAL HISTORY: ?Past Medical History:  ?Diagnosis Date  ? Anxiety   ? Dysphagia   ? HYPERTHYROIDISM   ? LYMPHADENOPATHY   ? MIGRAINE HEADACHE   ? Neuromuscular disorder ([place])   ? 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 ? ?PAST SURGICAL HISTORY: ?Past Surgical History:  ?Procedure Laterality Date  ? BALLOON DILATION N/A 09/18/2020  ? Procedure: BALLOON DILATION;  Surgeon: Ronnette Juniper, MD;  Location: [facility];  Service: Gastroenterology;  Laterality: N/A;  ? BUBBLE STUDY  07/03/2021  ? Procedure: BUBBLE STUDY;  Surgeon: Lelon Perla, MD;  Location: [facility];  Service: Cardiovascular;;  ? CARDIAC SURGERY    ? ESOPHAGOGASTRODUODENOSCOPY (EGD) WITH PROPOFOL N/A 09/18/2020  ? Procedure: ESOPHAGOGASTRODUODENOSCOPY (EGD) WITH PROPOFOL;  Surgeon: Ronnette Juniper, MD;  Location: [facility];  Service: Gastroenterology;  Laterality: N/A;  ? EYE SURGERY Bilateral   ? Cataract L eye 1-20, R eye 3-20   ? FOREIGN BODY REMOVAL  09/18/2020  ? Procedure: FOREIGN BODY REMOVAL;  Surgeon: Ronnette Juniper, MD;  Location: [facility];  Service: Gastroenterology;;  ? [place]  2001  ? PATENT FORAMEN OVALE(PFO) CLOSURE N/A 08/28/2021  ? Procedure: PATENT FORAMEN OVALE (PFO) CLOSURE;  Surgeon: Sherren Mocha, MD;  Location: [facility];  Service: Cardiovascular;  Laterality: N/A;  ? REVERSE SHOULDER ARTHROPLASTY Right 03/16/2019  ? Procedure: REVERSE SHOULDER ARTHROPLASTY;  Surgeon: Netta Cedars, MD;  Location: [facility];  Service: Orthopedics;  Laterality: Right;  interscalene block  ? TEE WITHOUT CARDIOVERSION N/A 07/03/2021  ? Procedure: TRANSESOPHAGEAL ECHOCARDIOGRAM (TEE);  Surgeon: Lelon Perla, MD;  Location: [facility];  Service: Cardiovascular;  Laterality: N/A;  ? TUBAL LIGATION    ? ? ?FAMILY HISTORY: ?Family History  ?Problem Relation Age of Onset  ? Heart  disease Mother   ? Stroke Father   ? Arthritis Sister   ? Diabetes Brother   ? Arthritis Brother   ? Neuropathy Brother   ? Neuropathy Sister   ? Diabetes Brother   ? ? ?SOCIAL HISTORY: ?Social History  ? ?Socioeconomic History  ? Marital status: Married  ?  Spouse name: Not on file  ? Number of children: Not on file  ? Years of education: Not on file  ? Highest education level: Not on file  ?Occupational History  ? Not on file  ?Tobacco Use  ? Smoking status: Former  ?  Types: Cigarettes  ?  Quit date: 1969  ?  Years since quitting: 54.2  ? Smokeless tobacco: Never  ?Vaping Use  ? Vaping Use: Never used  ?  Substance and Sexual Activity  ? Alcohol use: No  ? Drug use: No  ? Sexual activity: Not on file  ?Other Topics Concern  ? Not on file  ?Social History Narrative  ? Lives at home with husband (has mild Dementia) of 72 years  ? Right handed   ? Caffeine: 1 cup tea/day  ? ?Social Determinants of Health  ? ?Financial Resource Strain: Not on file  ?Food Insecurity: Not on file  ?Transportation Needs: Not on file  ?Physical Activity: Not on file  ?Stress: Not on file  ?Social Connections: Not on file  ?Intimate Partner Violence: Not on file  ? ? ? ? ?PHYSICAL EXAM ? ?Vitals:  ? 12/02/21 1009  ?BP: 131/73  ?Pulse: 62  ?Weight: 171 lb (77.6 kg)  ?Height: 5\' 5"  (1.651 m)  ? ?Body mass index is 28.46 kg/m?. ? ?Generalized: Well developed, in no acute distress  ? ?Neurological examination  ?Mentation: Alert oriented to time, place, history taking. Follows all commands speech and language fluent ?Cranial nerve II-XII: Pupils were equal round reactive to light. Extraocular movements were full, visual field were full on confrontational test. Facial sensation and strength were normal.  Head turning and shoulder shrug  were normal and symmetric. ?Motor: The motor testing reveals 5 over 5 strength of all 4 extremities. Good symmetric motor tone is noted throughout.  ?Sensory: Sensory testing is intact to soft touch on all 4  extremities. No evidence of extinction is noted.  ?Coordination: Cerebellar testing reveals good finger-nose-finger and heel-to-shin bilaterally.  ?Gait and station: Gait is normal.  ? ? ?DIAGNOSTIC DATA ([place]

## 2021-12-07 ENCOUNTER — Telehealth: Payer: Self-pay | Admitting: Adult Health

## 2021-12-07 DIAGNOSIS — G4733 Obstructive sleep apnea (adult) (pediatric): Secondary | ICD-10-CM

## 2021-12-07 NOTE — Telephone Encounter (Signed)
I called pt and relayed that got her message.  Will f/u when received message back form ashley at [employer].  Pt appreciated call back.  ?

## 2021-12-07 NOTE — Telephone Encounter (Signed)
Pt requesting air pressure on CPAP machine be adjusted. Pt stated, pressure is too high, feel like suffocating, can not sleep. Would like a call from the nurse to confirm.  ?

## 2021-12-07 NOTE — Telephone Encounter (Signed)
I contacted [facility] via community message to see about being tagged in [place].   ? ? ?

## 2021-12-08 NOTE — Telephone Encounter (Signed)
Pt called, Fax change in air pressure to [name]  to [phone] ?

## 2021-12-08 NOTE — Telephone Encounter (Signed)
This is pts DL so far.  Pt stated, pressure is too high, feel like suffocating, can not sleep.  ? ?

## 2021-12-08 NOTE — Telephone Encounter (Signed)
Order sent to decrease pressure 6 to 12 cm of water with EPR 3 ?

## 2021-12-08 NOTE — Telephone Encounter (Signed)
I have reached out to [facility] again asking for pt to be tagged.  ?

## 2021-12-09 NOTE — Telephone Encounter (Signed)
Denyse Amass, RN; Vanessa Ralphs ?got it   ? ?  ?Previous Messages ?  ?----- Message -----  ?From: Brandon Melnick, RN  ?Sent: 12/08/2021   2:14 PM EDT  ?To: Ocie Bob  ?Subject: autopap pressure changes                      ? ?New order in Epic, pressure changes to 6-12  (already made in [place]).    ? ?[name]  ?Female, 76 y.o., 01/22/1946  ?MRN:  ?[id number]  ?Phone:  ?[phone]  ? ? ?Sandy RN  ? ?

## 2021-12-30 ENCOUNTER — Telehealth: Payer: Self-pay | Admitting: Adult Health

## 2021-12-30 DIAGNOSIS — G4733 Obstructive sleep apnea (adult) (pediatric): Secondary | ICD-10-CM

## 2021-12-30 DIAGNOSIS — R519 Headache, unspecified: Secondary | ICD-10-CM

## 2021-12-30 DIAGNOSIS — G609 Hereditary and idiopathic neuropathy, unspecified: Secondary | ICD-10-CM

## 2021-12-30 DIAGNOSIS — R1312 Dysphagia, oropharyngeal phase: Secondary | ICD-10-CM

## 2021-12-30 DIAGNOSIS — I639 Cerebral infarction, unspecified: Secondary | ICD-10-CM

## 2021-12-30 DIAGNOSIS — Z789 Other specified health status: Secondary | ICD-10-CM

## 2021-12-30 DIAGNOSIS — I493 Ventricular premature depolarization: Secondary | ICD-10-CM

## 2021-12-30 NOTE — Telephone Encounter (Signed)
Pt is asking for a call to discuss how she is just unable to wear the CPAP mask any longer.  Phone rep asked if its too tight, pushing out too much or not enough air.  Pt stated she just isnt able to wear it, she wakes up in a state and would like a call to discuss other options. ?

## 2021-12-31 DIAGNOSIS — Z789 Other specified health status: Secondary | ICD-10-CM | POA: Insufficient documentation

## 2021-12-31 DIAGNOSIS — I639 Cerebral infarction, unspecified: Secondary | ICD-10-CM | POA: Insufficient documentation

## 2021-12-31 DIAGNOSIS — G4733 Obstructive sleep apnea (adult) (pediatric): Secondary | ICD-10-CM | POA: Insufficient documentation

## 2021-12-31 NOTE — Telephone Encounter (Signed)
I placed order for [facility] .  ?

## 2021-12-31 NOTE — Telephone Encounter (Signed)
Called patient back LVM per Dr Vickey Huger will send a referral to  ENT to be evaluated for [facility].[name]  ?

## 2021-12-31 NOTE — Telephone Encounter (Signed)
Spoke to [facility] she is very irritated with  CPAP machine. Pt states she has high anxiety and not able to sleep due to nightmares . Pt states she is not going to use CPAP machine anymore. Informed patient she will be at higher risk for stroke ,heart attack,and dementia pt states she knows and still not going to use CPAP machine .  Informed patient will talk to Dr Brett Fairy  if patient can be seen today and will call pt back  Pt thanked me for calling. ?

## 2021-12-31 NOTE — Addendum Note (Signed)
Addended by: Melvyn Novas on: 12/31/2021 05:19 PM ? ? Modules accepted: Orders ? ?

## 2021-12-31 NOTE — Telephone Encounter (Signed)
Spoke to patient  informed her Dr Dohmeier sent referral to ENT to be evaluated for [facility] . Had to explain to patient if she is wanting inspire she has to go to a ENT for evaluation. Pt expressed understanding and thanked me for calling  ?

## 2022-01-04 ENCOUNTER — Telehealth: Payer: Self-pay | Admitting: Neurology

## 2022-01-04 NOTE — Telephone Encounter (Signed)
[facility] ph # [phone] ?

## 2022-01-06 NOTE — Telephone Encounter (Signed)
I called Dr. Jenne Pane office to ask if they are in network with [facility] they informed me that they are in network.. I called the patient and informed her that they are in network with [employer] ?

## 2022-01-12 NOTE — Telephone Encounter (Signed)
Received a notification from [facility] pt returned machine.  ?"Patient states she is unable to use this machine.  Patient states she can no longer sleep with this mask and has tried to switch out the mask but still can't use the machine.  Patient also states she has been qualified for the inspire device.   Patient states her doctor is Dr.  Brett Fairy and she has been notified." ? ?

## 2022-01-14 ENCOUNTER — Other Ambulatory Visit: Payer: Self-pay | Admitting: Neurology

## 2022-01-27 ENCOUNTER — Ambulatory Visit: Payer: Medicare HMO | Admitting: Neurology

## 2022-01-27 ENCOUNTER — Encounter: Payer: Self-pay | Admitting: Neurology

## 2022-01-27 VITALS — BP 122/74 | HR 77 | Ht 65.0 in | Wt 169.0 lb

## 2022-01-27 DIAGNOSIS — Z789 Other specified health status: Secondary | ICD-10-CM | POA: Diagnosis not present

## 2022-01-27 DIAGNOSIS — G4733 Obstructive sleep apnea (adult) (pediatric): Secondary | ICD-10-CM

## 2022-01-27 DIAGNOSIS — F410 Panic disorder [episodic paroxysmal anxiety] without agoraphobia: Secondary | ICD-10-CM | POA: Diagnosis not present

## 2022-01-27 DIAGNOSIS — Z8659 Personal history of other mental and behavioral disorders: Secondary | ICD-10-CM

## 2022-01-27 NOTE — Patient Instructions (Signed)

## 2022-01-27 NOTE — Progress Notes (Signed)
? ? ?SLEEP MEDICINE CLINIC ?  ? ?Provider:  Larey Seat, MD  ?Primary Care Physician:  Lorene Dy, MD ?[address] ?[place] [address]  ? ?  ?Referring Provider: Lorene Dy, Md ?[address] [place] [address] ?[place],  [place] [address]  ?  ?  ?    ?Chief Complaint according to patient   ?Patient presents with:  ?  ? New Patient (Initial Visit 10-14-2021)  ?   Avg 7/8 hrs of sleep, may wake up one time during the night. When she wakes up she has headaches. Husband has witnessed apnea and snoring. Follow up on Sleep study.  ?CPAP machine pressure was adjusted and new mask has been tried.  She quit using CPAP after her anxiety was exacerbated.   ?  ?  ?HISTORY OF PRESENT ILLNESS:  ?[name] is a 76 y.o. 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 ?Her sleep study was a HST 11-26-2021- This HST confirms the presence of severe sleep apnea with REM accentuation and with supine accentuation of AHI. ?No significant hypoxia. ?This degree of apnea will require CPAP therapy- I like to start therapy and will  prescribe an autotitration device, ResMed ,setting of 6-16 cm water, 2 cm EPR and mask of choice , heated humidification. ?  ? she could use a dental device- but abvised me that she can't tolerate anything in her mouth-  and her AHI is likely less reduced as it would be by PAP therapy.  ?BMI would also allow for a Inspire device, same caveat of NREM sleep apnea being the only one responding, usually not  doing much for hypopneas. .    ? She has returned the machine to [place].  She uses a sleep wedge now and the patient feels his helped her . Husband noted less snoring.   ?She has gained 20 pounds on steroids in 06-2022, BMI is now 28- still trying to loose weight. She is able to sleep on her side, prefers this.  ?  ? ?Initial Sleep consult:  ?Chief concern according to patient :   The patient reports increased  anxiety and migraines almost daily, weather changes promoting these as well as . ?Her husband thinks she snores, she sleeps better since PFO closure.  ?She wakes up often at 4-5 AM and feels already refreshed. She is presenting here tearful . Today the migraine is very severe.  ? [name]  has a past medical history of GAD, Anxiety, Dysphagia, HYPERTHYROIDISM, LYMPHADENOPATHY, MIGRAINE HEADACHE, OTHER HEADACHES, TENSION neck pain, rhinitis. 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 Neuropathy.  ?  ?Sleep relevant medical history: goes to sleep with headaches, wakes up with headaches, neck pain.  ?CVA in April 2022 she was for a while headache free, gradually these came back. PFO closure reduced headaches further.  She wakes up with HA, not woken by HA.  ? Family medical /sleep history: no other family member on CPAP with OSA.  ?  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  ?Pets are not present. ?Tobacco use: 1968.  ETOH use none ,  ?Caffeine intake in form of Coffee( /) Soda( /) Tea ( 1 glass a  day, 1 cup a day) or energy drinks. ?Regular exercise in form of walking, gardening. .   ? ?  ?  ?Sleep habits are as follows: The patient's dinner time is between 5-6 PM. The patient goes to bed at 10 PM and continues to sleep for 3-4 hours, wakes for one bathroom breaks, the first time at 2 AM.   ?The preferred sleep position is laterally, with the support of 1 pillow.  ?Dreams are reportedly rare.  ?4-5  AM is the usual rise time. She is a "morning person"The patient wakes up spontaneously. ?He/ She reports not feeling refreshed or restored in AM, with symptoms such as dry mouth, morning headaches, and residual fatigue.  ?Naps are taken infrequently, lasting from 20 to 30 minutes and are refreshing. ?  ?Review of  Systems: ?Out of a complete 14 system review, the patient complains of only the following symptoms, and all other reviewed systems are negative.:  ?Fatigue, sleepiness , snoring,  but patient reports headaches.  ?  ?How likely are you to doze in the following situations: ?0 = not likely, 1 = slight chance, 2 = moderate chance, 3 = high chance ?  ?Sitting and Reading? ?Watching Television? ?Sitting inactive in a public place (theater or meeting)? ?As a passenger in a car for an hour without a break? ?Lying down in the afternoon when circumstances permit? ?Sitting and talking to someone? ?Sitting quietly after lunch without alcohol? ?In a car, while stopped for a few minutes in traffic? ?  ?Total = 3/ 24 points  ? FSS endorsed at 24/ 63 points.  ? ?Social History  ? ?Socioeconomic History  ? Marital status: Married  ?  Spouse name: Not on file  ? Number of children: Not on file  ? Years of education: Not on file  ? Highest education level: Not on file  ?Occupational History  ? Not on file  ?Tobacco Use  ? Smoking status: Former  ?  Types: Cigarettes  ?  Quit date: 1969  ?  Years since quitting: 54.3  ? Smokeless tobacco: Never  ?Vaping Use  ? Vaping Use: Never used  ?Substance and Sexual Activity  ? Alcohol use: No  ? Drug use: No  ? Sexual activity: Not on file  ?Other Topics Concern  ? Not on file  ?Social History Narrative  ? Lives at home with husband (has mild Dementia) of 37 years  ? Right handed   ? Caffeine: 1 cup tea/day  ? ?Social Determinants of Health  ? ?Financial Resource Strain: Not on file  ?Food Insecurity: Not on file  ?Transportation Needs: Not on file  ?Physical Activity: Not on file  ?Stress: Not on file  ?Social Connections: Not on file  ? ? ?Family History  ?Problem Relation Age of Onset  ? Heart disease Mother   ? Stroke Father   ? Arthritis Sister   ? Diabetes Brother   ? Arthritis Brother   ? Neuropathy Brother   ? Neuropathy Sister   ? Diabetes Brother   ? ? ?Past Medical History:   ?Diagnosis Date  ? Anxiety   ? Dysphagia   ? HYPERTHYROIDISM   ? LYMPHADENOPATHY   ? MIGRAINE HEADACHE   ? Neuromuscular disorder ([place])   ? 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 ? ?Past Surgical History:  ?Procedure Laterality Date  ? BALLOON DILATION N/A 09/18/2020  ? Procedure: BALLOON DILATION;  Surgeon: Ronnette Juniper, MD;  Location:  [facility];  Service: Gastroenterology;  Laterality: N/A;  ? BUBBLE STUDY  07/03/2021  ? Procedure: BUBBLE STUDY;  Surgeon: Lelon Perla, MD;  Location: [facility];  Service: Cardiovascular;;  ? CARDIAC SURGERY    ? ESOPHAGOGASTRODUODENOSCOPY (EGD) WITH PROPOFOL N/A 09/18/2020  ? Procedure: ESOPHAGOGASTRODUODENOSCOPY (EGD) WITH PROPOFOL;  Surgeon: Ronnette Juniper, MD;  Location: [facility];  Service: Gastroenterology;  Laterality: N/A;  ? EYE SURGERY Bilateral   ? Cataract L eye 1-20, R eye 3-20   ? FOREIGN BODY REMOVAL  09/18/2020  ? Procedure: FOREIGN BODY REMOVAL;  Surgeon: Ronnette Juniper, MD;  Location: [facility];  Service: Gastroenterology;;  ? [place]  2001  ? PATENT FORAMEN OVALE(PFO) CLOSURE N/A 08/28/2021  ? Procedure: PATENT FORAMEN OVALE (PFO) CLOSURE;  Surgeon: Sherren Mocha, MD;  Location: [facility];  Service: Cardiovascular;  Laterality: N/A;  ? REVERSE SHOULDER ARTHROPLASTY Right 03/16/2019  ? Procedure: REVERSE SHOULDER ARTHROPLASTY;  Surgeon: Netta Cedars, MD;  Location: [facility];  Service: Orthopedics;  Laterality: Right;  interscalene block  ? TEE WITHOUT CARDIOVERSION N/A 07/03/2021  ? Procedure: TRANSESOPHAGEAL ECHOCARDIOGRAM (TEE);  Surgeon: Lelon Perla, MD;  Location: [facility];  Service: Cardiovascular;  Laterality: N/A;  ? TUBAL LIGATION    ?  ? ?Current Outpatient Medications on File Prior to Visit  ?Medication Sig Dispense Refill  ? acetaminophen (TYLENOL) 500 MG tablet Take 1,000 mg by mouth every 4 (four) hours as needed for headache.    ? Calcium Carbonate+Vitamin D 600-200  MG-UNIT TABS Take 1 tablet by mouth 2 (two) times daily.    ? carboxymethylcellulose (REFRESH PLUS) 0.5 % SOLN Place 1 drop into both eyes 3 (three) times daily as needed (dry eyes).    ? clopidogrel (PLAVIX) 75 MG tablet T

## 2022-02-02 ENCOUNTER — Telehealth: Payer: Self-pay | Admitting: *Deleted

## 2022-02-02 NOTE — Telephone Encounter (Signed)
Saw [name] at church stated that she is doing well.  Still having some trouble with her thought process but is mostly clear. ?

## 2022-02-24 ENCOUNTER — Encounter: Payer: Self-pay | Admitting: Neurology

## 2022-02-24 ENCOUNTER — Ambulatory Visit (INDEPENDENT_AMBULATORY_CARE_PROVIDER_SITE_OTHER): Payer: Medicare HMO | Admitting: Neurology

## 2022-02-24 VITALS — BP 118/66 | HR 64 | Ht 65.0 in | Wt 168.6 lb

## 2022-02-24 DIAGNOSIS — R200 Anesthesia of skin: Secondary | ICD-10-CM

## 2022-02-24 DIAGNOSIS — E538 Deficiency of other specified B group vitamins: Secondary | ICD-10-CM | POA: Diagnosis not present

## 2022-02-24 DIAGNOSIS — E519 Thiamine deficiency, unspecified: Secondary | ICD-10-CM

## 2022-02-24 DIAGNOSIS — R7303 Prediabetes: Secondary | ICD-10-CM | POA: Diagnosis not present

## 2022-02-24 DIAGNOSIS — G629 Polyneuropathy, unspecified: Secondary | ICD-10-CM | POA: Diagnosis not present

## 2022-02-24 DIAGNOSIS — E531 Pyridoxine deficiency: Secondary | ICD-10-CM

## 2022-02-24 MED ORDER — GABAPENTIN 300 MG PO CAPS: 300.0000 mg | ORAL_CAPSULE | Freq: Four times a day (QID) | ORAL | 6 refills | Status: DC | PRN

## 2022-02-24 NOTE — Patient Instructions (Addendum)
Check labs for causes of peripheral neuropathy We will compound a cream for your feet Can try topical Capsaicin cream over the counter Increase Gabapentin  Peripheral Neuropathy Peripheral neuropathy is a type of nerve damage. It affects nerves that carry signals between the spinal cord and the arms, legs, and the rest of the body (peripheral nerves). It does not affect nerves in the spinal cord or brain. In peripheral neuropathy, one nerve or a group of nerves may be damaged. Peripheral neuropathy is a broad category that includes many specific nerve disorders, like diabetic neuropathy, hereditary neuropathy, and carpal tunnel syndrome. What are the causes? This condition may be caused by: Certain diseases, such as: Diabetes. This is the most common cause of peripheral neuropathy. Autoimmune diseases, such as rheumatoid arthritis and systemic lupus erythematosus. Nerve diseases that are passed from parent to child (inherited). Kidney disease. Thyroid disease. Other causes may include: Nerve injury. Pressure or stress on a nerve that lasts a long time. Lack (deficiency) of B vitamins. This can result from alcoholism, poor diet, or a restricted diet. Infections. Some medicines, such as cancer medicines (chemotherapy). Poisonous (toxic) substances, such as lead and mercury. Too little blood flowing to the legs. In some cases, the cause of this condition is not known. What are the signs or symptoms? Symptoms of this condition depend on which of your nerves is damaged. Symptoms in the legs, hands, and arms can include: Loss of feeling (numbness) in the feet, hands, or both. Tingling in the feet, hands, or both. Burning pain. Very sensitive skin. Weakness. Not being able to move a part of the body (paralysis). Clumsiness or poor coordination. Muscle twitching. Loss of balance. Symptoms in other parts of the body can include: Not being able to control your bladder. Feeling  dizzy. Sexual problems. How is this diagnosed? Diagnosing and finding the cause of peripheral neuropathy can be difficult. Your health care provider will take your medical history and do a physical exam. A neurological exam will also be done. This involves checking things that are affected by your brain, spinal cord, and nerves (nervous system). For example, your health care provider will check your reflexes, how you move, and what you can feel. You may have other tests, such as: Blood tests. Electromyogram (EMG) and nerve conduction tests. These tests check nerve function and how well the nerves are controlling the muscles. Imaging tests, such as a CT scan or MRI, to rule out other causes of your symptoms. Removing a small piece of nerve to be examined in a lab (nerve biopsy). Removing and examining a small amount of the fluid that surrounds the brain and spinal cord (lumbar puncture). How is this treated? Treatment for this condition may involve: Treating the underlying cause of the neuropathy, such as diabetes, kidney disease, or vitamin deficiencies. Stopping medicines that can cause neuropathy, such as chemotherapy. Medicine to help relieve pain. Medicines may include: Prescription or over-the-counter pain medicine. Anti-seizure medicine. Antidepressants. Pain-relieving patches that are applied to painful areas of skin. Surgery to relieve pressure on a nerve or to destroy a nerve that is causing pain. Physical therapy to help improve movement and balance. Devices to help you move around (assistive devices). Follow these instructions at home: Medicines Take over-the-counter and prescription medicines only as told by your health care provider. Do not take any other medicines without first asking your health care provider. Ask your health care provider if the medicine prescribed to you requires you to avoid driving or using machinery.  Lifestyle  Do not use any products that contain  nicotine or tobacco. These products include cigarettes, chewing tobacco, and vaping devices, such as e-cigarettes. Smoking keeps blood from reaching damaged nerves. If you need help quitting, ask your health care provider. Avoid or limit alcohol. Too much alcohol can cause a vitamin B deficiency, and vitamin B is needed for healthy nerves. Eat a healthy diet. This includes: Eating foods that are high in fiber, such as beans, whole grains, and fresh fruits and vegetables. Limiting foods that are high in fat and processed sugars, such as fried or sweet foods. General instructions  If you have diabetes, work closely with your health care provider to keep your blood sugar under control. If you have numbness in your feet: Check every day for signs of injury or infection. Watch for redness, warmth, and swelling. Wear padded socks and comfortable shoes. These help protect your feet. Develop a good support system. Living with peripheral neuropathy can be stressful. Consider talking with a mental health specialist or joining a support group. Use assistive devices and attend physical therapy as told by your health care provider. This may include using a walker or a cane. Keep all follow-up visits. This is important. Where to find more information [employer] of Neurological Disorders: [employer] Contact a health care provider if: You have new signs or symptoms of peripheral neuropathy. You are struggling emotionally from dealing with peripheral neuropathy. Your pain is not well controlled. Get help right away if: You have an injury or infection that is not healing normally. You develop new weakness in an arm or leg. You have fallen or do so frequently. Summary Peripheral neuropathy is when the nerves in the arms or legs are damaged, resulting in numbness, weakness, or pain. There are many causes of peripheral neuropathy, including diabetes, pinched nerves, vitamin deficiencies,  autoimmune disease, and hereditary conditions. Diagnosing and finding the cause of peripheral neuropathy can be difficult. Your health care provider will take your medical history, do a physical exam, and do tests, including blood tests and nerve function tests. Treatment involves treating the underlying cause of the neuropathy and taking medicines to help control pain. Physical therapy and assistive devices may also help. This information is not intended to replace advice given to you by your health care provider. Make sure you discuss any questions you have with your health care provider. Document Revised: 05/12/2021 Document Reviewed: 05/12/2021 Elsevier Patient Education  [place].

## 2022-02-24 NOTE — Progress Notes (Signed)
[id number]    Provider:  Dr Jaynee Eagles Requesting Provider: Lorene Dy, MD, Primary Care Provider:  Lorene Dy, MD  CC: neuropathy in the feet  02/24/2022: Here for neuropathy pain today. She was given gabapentin by Dr. Brett Fairy. They start hurting very badly, they get very cold, burning, she puts on double socks, heating pads, nothing seems to help. It is goin gup her legs, both feet, started at the toes and increased. Started years ago, she thought she was walking a lot and it gradually worsened, she has foot surgery for bunions, in all the toes especially in the big toe, continuous, mostly coldness, she rubes them back and forth to try and get blood flow. She rubs them constantly. No hx of smoking or being around a lot of smoke. Tried OTC lidocaine, biofreeze, here with husband who also provides information.   Patient complains of symptoms per HPI as well as the following symptoms: neuropathy . Pertinent negatives and positives per HPI. All others negative  07/29/2021: She had TEE and has a small pfo could be the cause of her embolic stroke. Wearing the heart monitor, hs a follow up with Dr. Burt Knack next week to discuss pfo. She is wearing a heart monitor. She has been having headaches, they are migraines, she has a hx of migraines, she gets very upset and she cries, didn't happen after the hospital all of a sudden she gets very anxious and depressed and then she cries and gets a headache and is stressed out. She has a little one when she wakes in the morning. She is excessively fatigued, she takes a tyenol that helps, she has daily morning headaches and husband states she snores heavily. Hx of embolic strokes, needs sleep study and repeat MRi brain  Patient complains of symptoms per HPI as well as the following symptoms: stress . Pertinent negatives and positives per HPI. 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  tPA at that time was not appropriate due to low hemoglobin and suspected hemolytic anemia.  Lower extremity venous Doppler showed a DVT and a 2D echo suggested a PE.  Due to location of stroke, patient also had quadrantanopia which has significantly improved, in fact  patient states she is feeling well.    MRI brain 01/05/2021: Acute infarcts of the right parietotemporal lobes greater than left frontal lobe. Additional punctate left parietal and right occipital acute infarcts.   Mild chronic microvascular ischemic changes. Few chronic microhemorrhages.. personally reviewed and agree with finsings  MRA: No intracranial large vessel occlusion or proximal high-grade arterial stenosis.  Echocardiogram: IMPRESSIONS     1. Left ventricular ejection fraction, by estimation, is 60 to 65%. The  left ventricle has normal function. The left ventricle has no regional  wall motion abnormalities. Left ventricular diastolic parameters are  consistent with Grade I diastolic  dysfunction (impaired relaxation).   2. McConnell's sign appears to be present.. Right ventricular systolic  function is mildly reduced. The right ventricular size is moderately  enlarged. There is moderately elevated pulmonary  artery systolic pressure.   3. Right atrial size was moderately dilated.   4. The mitral valve is normal in structure. Mild mitral valve  regurgitation. No evidence of mitral stenosis.   5. Tricuspid valve regurgitation is moderate.   6. The aortic valve is tricuspid. Aortic valve regurgitation is not   visualized. No aortic stenosis is present.   7. The inferior vena cava is dilated in size with <50% respiratory  variability, suggesting right atrial pressure of 15 mmHg.    Carotid Ultrasounds: Summary:  Right Carotid: The extracranial vessels were near-normal with only minimal  wall                 thickening or plaque.   Left Carotid: The extracranial vessels were near-normal with only minimal  wall                thickening or plaque.   Vertebrals:  Bilateral vertebral arteries demonstrate antegrade flow.  Subclavians: Normal flow hemodynamics were seen in bilateral subclavian       HPI neuropathy 08/21/2020:  [name] is a 76 y.o. female here as requested by Lorene Dy, MD for aching and cold feet, past medical history of distal first metatarsal osteotomy to repair hallux valgus left with screw fixation in the same procedure on the right both in 2019, migraines, hyperthyroidism, I reviewed Dr. Barkley Bruns notes which reported palpable dorsalis pedis and posterior tibialis pulses in both feet, normal temperature gradient from ankle to toes, normal color and turgor, digital capillary refill time normal, subjective paresthesias and tingling, normal sharp and dull sensation is noted, decreased vibratory sensation in both feet, Achilles tendon reflexes diminished, Tinel's sign is negative at the tarsal tunnel. No other information available since podiatry referred we do not have their primary care note, would appreciate it if podiatry defers to primary care to refer to specialists as they deem clinically appropriate.  Started 3 years ago. With husband who provides information. About a year ago she saw her arthritis doctor and she was having problems with her feet and she was told she has arthritis and she saw a foot doctor. It is in the top of the feet and comes up the legs. She wakes early mornings and she has cramps in the feet and sometimes all the legs hurt. She has back pain but no  radicular symptoms, she has a lot of cramps, she tries to drink water. No pre-diabetes, no diabetes, no autoimmune disease, no chemotherapy, brother has parkinson's, sister has neuropathy without a diagnosis, she does not know if parents had neuropathy. Numbness and tingling and itching and it comes all the way up to the thighs, inparticular on the top of the right foot. At night both her legs hurt all the way to the thighs not in a particular dermatome. The legs ache but the feet are more tingling, numb, itchy, cold, Gabapentin helps.   Reviewed notes, labs and imaging from outside physicians, which showed: see above  Review of Systems: Patient complains of symptoms per HPI as well as the following symptoms: headache . Pertinent negatives and positives per HPI. All others negative    Social History   Socioeconomic History   Marital status: Married    Spouse name: Not on file   Number of children: Not on file   Years of education: Not on file   Highest education level: Not on file  Occupational History   Not on file  Tobacco Use   Smoking status: Former  Types: Cigarettes    Quit date: 93    Years since quitting: 54.4   Smokeless tobacco: Never  Vaping Use   Vaping Use: Never used  Substance and Sexual Activity   Alcohol use: No   Drug use: No   Sexual activity: Not on file  Other Topics Concern   Not on file  Social History Narrative   Lives at home with husband (has mild Dementia) of 19 years   Right handed    Caffeine: 1 cup tea/day   Social Determinants of Health   Financial Resource Strain: Not on file  Food Insecurity: Not on file  Transportation Needs: Not on file  Physical Activity: Not on file  Stress: Not on file  Social Connections: Not on file  Intimate Partner Violence: Not on file    Family History  Problem Relation Age of Onset   Heart disease Mother    Stroke Father    Arthritis Sister    Diabetes Brother    Arthritis Brother    Neuropathy  Brother    Neuropathy Sister    Diabetes Brother     Past Medical History:  Diagnosis Date   Anxiety    Dysphagia    HYPERTHYROIDISM    LYMPHADENOPATHY    MIGRAINE HEADACHE    Neuromuscular disorder ([place])    condition where muscle separates from bone left chest, quarter size, pain intermittent    Patient Active Problem List   Diagnosis Date Noted   History of claustrophobia 01/27/2022   Anxiety attack 01/27/2022   Severe obstructive sleep apnea-hypopnea syndrome 01/27/2022   Intolerance of continuous positive airway pressure (CPAP) ventilation 12/31/2021   Cryptogenic stroke ([place]) 12/31/2021   OSA (obstructive sleep apnea) 12/31/2021   Sleep-related headache 10/14/2021   Snoring 10/14/2021   Excessive daytime sleepiness 10/14/2021   Worsening headaches 10/14/2021   Cerebrovascular accident (CVA) due to embolism of precerebral artery ([place]) 10/14/2021   Chronic migraine without aura, intractable, without status migrainosus 10/14/2021   PFO (patent foramen ovale) 07/29/2021   Symptomatic anemia 02/19/2021   DVT (deep venous thrombosis) ([place]) 01/12/2021   Pulmonary embolism ([place]) 01/12/2021   Hypothyroidism 01/12/2021   Anxiety 01/12/2021   Cerebral thrombosis with cerebral infarction 01/06/2021   Cerebral embolism with cerebral infarction 01/06/2021   Hemolytic anemia ([place]) 01/03/2021   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   Polyneuropathy 08/22/2020   S/P shoulder replacement, 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/2019   PVC (premature ventricular contraction) 07/11/2013   Dysphagia    HYPERTHYROIDISM 07/17/2010   MIGRAINE HEADACHE 07/17/2010   LYMPHADENOPATHY 07/17/2010    Past Surgical History:  Procedure Laterality Date   BALLOON DILATION N/A 09/18/2020   Procedure: BALLOON DILATION;  Surgeon: Ronnette Juniper, MD;  Location: [facility] ENDOSCOPY;  Service: Gastroenterology;  Laterality: N/A;   BUBBLE STUDY  07/03/2021   Procedure: BUBBLE STUDY;  Surgeon: Lelon Perla, MD;   Location[facility] ENDOSCOPY;  Service: Cardiovascular;;   CARDIAC SURGERY     ESOPHAGOGASTRODUODENOSCOPY (EGD) WITH PROPOFOL N/A 09/18/2020   Procedure: ESOPHAGOGASTRODUODENOSCOPY (EGD) WITH PROPOFOL;  Surgeon: Ronnette Juniper, MD;  Location: [facility] ENDOSCOPY;  Service: Gastroenterology;  Laterality: N/A;   EYE SURGERY Bilateral    Cataract L eye 1-20, R eye 3-20    FOREIGN BODY REMOVAL  09/18/2020   Procedure: FOREIGN BODY REMOVAL;  Surgeon: Ronnette Juniper, MD;  Location: [facility] ENDOSCOPY;  Service: Gastroenterology;;   [place] OVALE(PFO) CLOSURE N/A 08/28/2021   Procedure: PATENT FORAMEN OVALE (PFO) CLOSURE;  Surgeon: Sherren Mocha, MD;  Location: [place] CV LAB;  Service: Cardiovascular;  Laterality: N/A;   REVERSE SHOULDER ARTHROPLASTY Right 03/16/2019   Procedure: REVERSE SHOULDER ARTHROPLASTY;  Surgeon: Netta Cedars, MD;  Location: [facility] ORS;  Service: Orthopedics;  Laterality: Right;  interscalene block   TEE WITHOUT CARDIOVERSION N/A 07/03/2021   Procedure: TRANSESOPHAGEAL ECHOCARDIOGRAM (TEE);  Surgeon: Lelon Perla, MD;  Location: [facility] ENDOSCOPY;  Service: Cardiovascular;  Laterality: N/A;   TUBAL LIGATION      Current Outpatient Medications  Medication Sig Dispense Refill   acetaminophen (TYLENOL) 500 MG tablet Take 1,000 mg by mouth every 4 (four) hours as needed for headache.     Calcium Carbonate+Vitamin D 600-200 MG-UNIT TABS Take 1 tablet by mouth 2 (two) times daily.     carboxymethylcellulose (REFRESH PLUS) 0.5 % SOLN Place 1 drop into both eyes 3 (three) times daily as needed (dry eyes).     clopidogrel (PLAVIX) 75 MG tablet Take 1 tablet (75 mg total) by mouth daily. Start 5 days before PFO closure procedure 90 tablet 1   diphenhydrAMINE (BENADRYL) 25 MG tablet Take 25-50 mg by mouth daily as needed for allergies.     folic acid (FOLVITE) 1 MG tablet Take 2 tablets (2 mg total) by mouth daily.     HYDROcodone-acetaminophen (NORCO/VICODIN) 5-325 MG tablet  Take 1 tablet by mouth every 4 (four) hours as needed for severe pain.     levothyroxine (SYNTHROID) 112 MCG tablet Take 112 mcg by mouth daily before breakfast.     LORazepam (ATIVAN) 2 MG tablet Take 2 mg by mouth 3 (three) times daily as needed for anxiety.     methocarbamol (ROBAXIN) 750 MG tablet Take 750 mg by mouth 2 (two) times daily as needed for muscle spasms.     Multiple Vitamins-Minerals (MULTIVITAMIN WITH MINERALS) tablet Take 1 tablet by mouth daily.     MYRBETRIQ 25 MG TB24 tablet Take 25 mg by mouth daily.     nystatin (MYCOSTATIN/NYSTOP) powder Apply 1 application topically 3 (three) times daily as needed (irritation).     omeprazole (PRILOSEC) 20 MG capsule Take 20 mg by mouth daily.     polycarbophil (FIBERCON) 625 MG tablet Take 625 mg by mouth in the morning and at bedtime.     sertraline (ZOLOFT) 50 MG tablet Take 50 mg by mouth daily.     triamcinolone cream (KENALOG) 0.1 % 1 application daily as needed (Vaginal irritations).     gabapentin (NEURONTIN) 300 MG capsule Take 1 capsule (300 mg total) by mouth 4 (four) times daily as needed. 240 capsule 6   No current facility-administered medications for this visit.    Allergies as of 02/24/2022 - Review Complete 02/24/2022  Allergen Reaction Noted   Oxycodone Other (See Comments) 03/06/2019   Aspirin Other (See Comments) 03/06/2019   Imipenem Other (See Comments) 03/06/2019   Statins Other (See Comments) 04/29/2021   Cephalexin Other (See Comments) 03/06/2019   Clindamycin/lincomycin Nausea And Vomiting and Rash 03/06/2019   Contrast media [iodinated contrast media] Rash 09/23/2020   Losartan potassium-hctz Other (See Comments) 11/06/2020   Methylprednisolone Rash 12/02/2021   Penicillins Rash 04/29/2021   Quinolones Rash     Vitals: BP 118/66   Pulse 64   Ht _0  (1.651 m)   Wt 168 lb 9.6 oz (76.5 kg)   BMI 28.06 kg/m  Last Weight:  Wt Readings from Last 1 Encounters:  02/24/22 168 lb 9.6 oz (76.5 kg)    Last  Height:   Ht Readings from Last 1 Encounters:  02/24/22 _0  (1.651 m)    Exam: NAD, pleasant                  Speech:    Speech is normal; fluent and spontaneous with normal comprehension.  Cognition:    The patient is oriented to person, place, and time;     recent and remote memory intact;     language fluent;    Cranial Nerves:    The pupils are equal, round, and reactive to light.Trigeminal sensation is intact and the muscles of mastication are normal. The face is symmetric. The palate elevates in the midline. Hearing intact. Voice is normal. Shoulder shrug is normal. The tongue has normal motion without fasciculations.   Coordination:  No dysmetria  Motor Observation:    No asymmetry, no atrophy, and no involuntary movements noted. Tone:    Normal muscle tone.     Strength:    Strength is V/V in the upper and lower limbs.      Sensation: absent temp distally both feet, intact vibration and proprioception, decreased pin prick in a gradient fashion distally.  Reflexes: absent AJs, symmetrica    Assessment/Plan:  Patient with long-standing neuropathy, appears to be more small-fiber.  Check labs for causes of peripheral neuropathy We will compound a cream for your feet Can try topical Capsaicin cream over the counter Increase Gabapentin Keep follow up  Orders Placed This Encounter  Procedures   Hemoglobin A1c   B12 and Folate Panel   Vitamin B1   Methylmalonic acid, serum   Multiple Myeloma Panel (SPEP&IFE w/QIG)   Heavy metals, blood   ANA, IFA (with reflex)   Sjogren's syndrome antibods(ssa + ssb)   Vitamin B6   Basic Metabolic Panel   Meds ordered this encounter  Medications   gabapentin (NEURONTIN) 300 MG capsule    Sig: Take 1 capsule (300 mg total) by mouth 4 (four) times daily as needed.    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  tPA at that time was not appropriate due to low hemoglobin and suspected hemolytic anemia.  Lower extremity venous Doppler showed a DVT and a 2D echo suggested a PE.  Due to location of stroke, patient also had quadrantanopia which has significantly improved, in fact  patient states she is feeling well but she is a little emotional in the offcie today  -I discussed patient's hospital course and findings with her and her husband, answered all questions, her neuro exam is quite good with just some mild residual visual changes, and she is feeling very well.  Etiology was uncertain but thought to be related to hemolytic anemia versus paradoxical emboli with PE and DVT if a PFO is present(small pfo found)  - She had TEE and has a small pfo could be the cause of her embolic stroke in the setting of DVT/PE.  Has follow  up with Dr. Burt Knack next week.  -Wearing the heart monitor, hs a follow up with Dr. Burt Knack next week to d discuss pfo. I reached out to Dr. Burt Knack to see if she should send it in early to get read prior to appointment (wearing it about 3 weeks now)  - SLEEP EVALUATION: She has a daily morning morning. She is excessively fatigued she has daily morning headaches and husband states she snores heavily. Hx of embolic strokes, needs sleep study and repeat MRi brain. Dr. Brett Fairy treats her husband for dementia, will send to her.   -She has been having headaches, they are migraines, she has a hx of migraines, she gets very upset and she cries, didn't happen after the hospital all of a  sudden she gets very anxious and depressed and then she cries and gets a headache and is stressed out. Will repeat MRI brain and get a sleep evaluation. Reasured her she is dealing with a lot, her husband has dementia. She had embolic strokes and now a pfo and wearing a heart monitor.    Cc: Lorene Dy, MD,  Lorene Dy, MD, Dr. Burney Gauze, Dr. Redgie Grayer, MD  [facility] [address] [place], [place] [address]  Phone [phone] Fax [phone]  I spent 30 minutes of face-to-face and non-face-to-face time with patient on the  1. Polyneuropathy   2. Numbness of toes   3. Pre-diabetes   4. screen for B12 deficiency   5. screen for Vitamin B1 deficiency   6. sceen for Vitamin B6 deficiency    diagnosis.  This included previsit chart review, lab review, study review, order entry, electronic health record documentation, patient education on the different diagnostic and therapeutic options, counseling and coordination of care, risks and benefits of management, compliance, or risk factor reduction

## 2022-02-25 ENCOUNTER — Telehealth: Payer: Self-pay | Admitting: *Deleted

## 2022-02-25 ENCOUNTER — Encounter (HOSPITAL_COMMUNITY): Payer: Self-pay | Admitting: Hematology & Oncology

## 2022-02-25 ENCOUNTER — Other Ambulatory Visit: Payer: Self-pay | Admitting: Neurology

## 2022-02-25 ENCOUNTER — Telehealth: Payer: Self-pay | Admitting: Neurology

## 2022-02-25 MED ORDER — GABAPENTIN 300 MG PO CAPS
300.0000 mg | ORAL_CAPSULE | Freq: Four times a day (QID) | ORAL | 1 refills | Status: DC | PRN
Start: 2022-02-25 — End: 2022-08-19

## 2022-02-25 MED ORDER — GABAPENTIN 300 MG PO CAPS: 300.0000 mg | ORAL_CAPSULE | Freq: Four times a day (QID) | ORAL | 6 refills | Status: DC | PRN

## 2022-02-25 NOTE — Addendum Note (Signed)
Addended by: Brandon Melnick on: 02/25/2022 09:35 AM   Modules accepted: Orders

## 2022-02-25 NOTE — Telephone Encounter (Signed)
Per Dr. Jaynee Eagles, completed order form for neuropathy compounded cream, allowing pharmacist to substitute if needed for insurance purposes.  Order being sent is amantadine 8%, baclofen 2%, gabapentin 6%, amitriptyline 4%, bupivacaine 2%, clonidine 0.2%.   Patient sig: Apply 1 to 2 g to the affected area 3-4 times daily.  Dispense 240 g with 5 refills.   Form signed by Dr Jaynee Eagles then faxed to [facility] at [phone]. Received a receipt of confirmation.

## 2022-02-25 NOTE — Telephone Encounter (Signed)
Per Dr Lucia Gaskins, Gabapentin Rx changed to a 3 month supply which is 720 caps.

## 2022-02-25 NOTE — Telephone Encounter (Signed)
Dr Lucia Gaskins prescription the Gabapentin on 02/24/22. 300 mg QID PRN. Prescription for neuropathy cream is in process and will be sent to [facility].

## 2022-02-25 NOTE — Addendum Note (Signed)
Addended by: Gildardo Griffes on: 02/25/2022 05:06 PM   Modules accepted: Orders

## 2022-02-25 NOTE — Telephone Encounter (Signed)
Pt would like a call from the nurse to clarify increase for gabapentin (NEURONTIN) 300 MG capsule. Can send the prescription with the increase to [facility]. Had discuss at office visit yesterday to send in prescription for a compound cream for my feet.

## 2022-02-25 NOTE — Telephone Encounter (Addendum)
I called pt.  I told her that [facility] is compounding pharmacy and is mail order we faxed order to them today, they should be in touch 24-48 hours.  Will mail info sheet to her. I gave her the # to pharmacy.  She also asked about gabapentin 300mg  po qid is what she was taking and at visit yesterday spoke to increasing dose.  [facility].  (Would be 90 day supply for increase).

## 2022-02-25 NOTE — Telephone Encounter (Signed)
[facility] with [facility] and canceled Rx for Gabapentin. Called pt and discussed new dose changes. Pt verbalized understanding that she can now take 300-600 mg (1-2 capsules) four times daily as needed. Pt verbalized appreciation and her questions were answered.   Message sent to [facility] as f/u as well.

## 2022-02-26 ENCOUNTER — Encounter (HOSPITAL_COMMUNITY): Payer: Self-pay | Admitting: Hematology & Oncology

## 2022-02-27 ENCOUNTER — Encounter (HOSPITAL_COMMUNITY): Payer: Self-pay | Admitting: Hematology & Oncology

## 2022-02-28 ENCOUNTER — Encounter (HOSPITAL_COMMUNITY): Payer: Self-pay | Admitting: Hematology & Oncology

## 2022-03-01 NOTE — Telephone Encounter (Signed)
Spoke with [facility]. They stated the compounded cream has to have a main ingredient and they were unable to process my request. I called [facility] @ [place] and was told they do not do compounded creams. I called [facility] in [place], [place]. They are contracted with [employer] medicare. Will talk to Dr Jaynee Eagles tomorrow AM when she is back in the office to see if ok to send Rx there. Order form here has different formulations to pick from.

## 2022-03-01 NOTE — Telephone Encounter (Signed)
Prescription faxed to [facility]. Received a receipt of confirmation.

## 2022-03-01 NOTE — Telephone Encounter (Signed)
I called the pt back and LVM with office number for her to call back when she can.

## 2022-03-01 NOTE — Telephone Encounter (Signed)
Patient left a voicemail with our office this morning asking for a call to discuss this compounded cream.

## 2022-03-01 NOTE — Telephone Encounter (Signed)
Pt called back and said they no longer deal with [facility]. She called [employer] and they told her that Dr Jaynee Eagles would have to send the prescription to [facility] and they have to review and see if they will pay. If they will pay, prescription needs to be sent to [facility] @ [place] [phone].  [place] pharmacy fax: [phone]

## 2022-03-02 ENCOUNTER — Encounter (HOSPITAL_COMMUNITY): Payer: Self-pay | Admitting: Hematology & Oncology

## 2022-03-02 LAB — BASIC METABOLIC PANEL
BUN/Creatinine Ratio: 13 (ref 12–28)
BUN: 7 mg/dL — ABNORMAL LOW (ref 8–27)
CO2: 19 mmol/L — ABNORMAL LOW (ref 20–29)
Calcium: 9.8 mg/dL (ref 8.7–10.3)
Chloride: 105 mmol/L (ref 96–106)
Creatinine, Ser: 0.52 mg/dL — ABNORMAL LOW (ref 0.57–1.00)
Glucose: 119 mg/dL — ABNORMAL HIGH (ref 70–99)
Potassium: 4.4 mmol/L (ref 3.5–5.2)
Sodium: 139 mmol/L (ref 134–144)
eGFR: 98 mL/min/{1.73_m2} (ref >59)

## 2022-03-02 LAB — MULTIPLE MYELOMA PANEL, SERUM
Albumin SerPl Elph-Mcnc: 3.8 g/dL (ref 2.9–4.4)
Albumin/Glob SerPl: 1.5 (ref 0.7–1.7)
Alpha 1: 0.3 g/dL (ref 0.0–0.4)
Alpha2 Glob SerPl Elph-Mcnc: 0.7 g/dL (ref 0.4–1.0)
B-Globulin SerPl Elph-Mcnc: 0.9 g/dL (ref 0.7–1.3)
Gamma Glob SerPl Elph-Mcnc: 0.7 g/dL (ref 0.4–1.8)
Globulin, Total: 2.6 g/dL (ref 2.2–3.9)
IgA/Immunoglobulin A, Serum: 119 mg/dL (ref 64–422)
IgG (Immunoglobin G), Serum: 555 mg/dL — ABNORMAL LOW (ref 586–1602)
IgM (Immunoglobulin M), Srm: 87 mg/dL (ref 26–217)
Total Protein: 6.4 g/dL (ref 6.0–8.5)

## 2022-03-02 LAB — VITAMIN B1: Thiamine: 176.1 nmol/L (ref 66.5–200.0)

## 2022-03-02 LAB — SJOGREN'S SYNDROME ANTIBODS(SSA + SSB)
ENA SSA (RO) Ab: <0.2 AI (ref 0.0–0.9)
ENA SSB (LA) Ab: <0.2 AI (ref 0.0–0.9)

## 2022-03-02 LAB — VITAMIN B6: Vitamin B6: 25.2 ug/L (ref 3.4–65.2)

## 2022-03-02 LAB — B12 AND FOLATE PANEL
Folate: >20 ng/mL (ref >3.0)
Vitamin B-12: 710 pg/mL (ref 232–1245)

## 2022-03-02 LAB — HEAVY METALS, BLOOD
Arsenic: 3 ug/L (ref 0–9)
Lead, Blood: <1 ug/dL (ref 0.0–3.4)
Mercury: <1 ug/L (ref 0.0–14.9)

## 2022-03-02 LAB — METHYLMALONIC ACID, SERUM: Methylmalonic Acid: 135 nmol/L (ref 0–378)

## 2022-03-02 LAB — HEMOGLOBIN A1C
Est. average glucose Bld gHb Est-mCnc: 120 mg/dL
Hgb A1c MFr Bld: 5.8 % — ABNORMAL HIGH (ref 4.8–5.6)

## 2022-03-02 LAB — ANTINUCLEAR ANTIBODIES, IFA: ANA Titer 1: NEGATIVE

## 2022-03-02 NOTE — Telephone Encounter (Signed)
Per Dr Jaynee Eagles, order form completed for [facility] to do compounded cream:  Carbamazapine 3%, gabapentin 6%, Imipramine 3%, meloxicam 0.5%, Pentoxifyline 3%, Lidocaine 2.5%, Prilocaine 2.5%.   Apply 1 to 2 g to the affected area 3-4 times daily.  Dispense 240 g with 5 refills.  Order signed by Dr Jaynee Eagles and faxed to [facility]. Received a receipt of confirmation.  Spoke with patient and updated her that we are sending a prescription to [facility] and information is being mailed to her. Pt verbalized appreciation and her questions were answered.

## 2022-03-04 NOTE — Telephone Encounter (Signed)
Received fax from [facility] pharmacy asking for alternative prescription per pt's request due to insurance. New order form completed for the following compound cream:  Baclofen 3%, Clonidine 0.2%, Diclofenac 4%, Gabapentin 6%, Lidocaine 3%.   Same instructions and dispense amount/refills as previously ordered. Order signed by Dr Lucia Gaskins and faxed to pharmacy. Received a receipt of confirmation. MAR updated. Updated pt via mychart.

## 2022-03-08 ENCOUNTER — Other Ambulatory Visit: Payer: Self-pay | Admitting: Pharmacist

## 2022-03-31 ENCOUNTER — Telehealth: Payer: Self-pay | Admitting: Neurology

## 2022-03-31 DIAGNOSIS — Z6827 Body mass index (BMI) 27.0-27.9, adult: Secondary | ICD-10-CM | POA: Insufficient documentation

## 2022-03-31 NOTE — Telephone Encounter (Signed)
Called the pt and there was no answer. The VM cut off before I was able to leave a message.   If pt calls back please advise that she just had a sleep study in March of this year. Insurance will not cover a new sleep study. The study is still valid since it is within the year to determine if candidate for inspire.

## 2022-03-31 NOTE — Telephone Encounter (Signed)
Pt is calling and requesting a second "Sleep Study" before, deciding on [facility]sp[facility] surgery. Pt requesting  a call back from the nurse.

## 2022-04-01 ENCOUNTER — Encounter: Payer: Self-pay | Admitting: Neurology

## 2022-04-05 ENCOUNTER — Other Ambulatory Visit: Payer: Self-pay | Admitting: *Deleted

## 2022-04-05 DIAGNOSIS — R0683 Snoring: Secondary | ICD-10-CM

## 2022-04-05 DIAGNOSIS — R519 Headache, unspecified: Secondary | ICD-10-CM

## 2022-04-05 DIAGNOSIS — G43719 Chronic migraine without aura, intractable, without status migrainosus: Secondary | ICD-10-CM

## 2022-04-05 DIAGNOSIS — G4733 Obstructive sleep apnea (adult) (pediatric): Secondary | ICD-10-CM

## 2022-04-05 DIAGNOSIS — Z789 Other specified health status: Secondary | ICD-10-CM

## 2022-04-05 DIAGNOSIS — I6310 Cerebral infarction due to embolism of unspecified precerebral artery: Secondary | ICD-10-CM

## 2022-04-05 DIAGNOSIS — G4719 Other hypersomnia: Secondary | ICD-10-CM

## 2022-04-05 DIAGNOSIS — I639 Cerebral infarction, unspecified: Secondary | ICD-10-CM

## 2022-04-05 DIAGNOSIS — Q2112 Patent foramen ovale: Secondary | ICD-10-CM

## 2022-04-05 NOTE — Telephone Encounter (Signed)
Replied to pt mychart message. 

## 2022-04-05 NOTE — Telephone Encounter (Signed)
Pt would like a call from the nurse to discuss getting another sleep study. Pt said spoke with insurance and they said is no limit on getting a sleep study.

## 2022-04-13 ENCOUNTER — Telehealth: Payer: Self-pay | Admitting: Neurology

## 2022-04-13 NOTE — Telephone Encounter (Signed)
HST- [employer] no Berkley Harvey req spoke to [facility] ref # [id number].  Patient is scheduled at [facility] for 04/20/22 at 1 pm.  I sent patient a mychart message.

## 2022-04-20 ENCOUNTER — Ambulatory Visit (INDEPENDENT_AMBULATORY_CARE_PROVIDER_SITE_OTHER): Payer: Medicare HMO | Admitting: Neurology

## 2022-04-20 DIAGNOSIS — R0683 Snoring: Secondary | ICD-10-CM

## 2022-04-20 DIAGNOSIS — I639 Cerebral infarction, unspecified: Secondary | ICD-10-CM

## 2022-04-20 DIAGNOSIS — I6310 Cerebral infarction due to embolism of unspecified precerebral artery: Secondary | ICD-10-CM

## 2022-04-20 DIAGNOSIS — Z789 Other specified health status: Secondary | ICD-10-CM

## 2022-04-20 DIAGNOSIS — G4719 Other hypersomnia: Secondary | ICD-10-CM

## 2022-04-20 DIAGNOSIS — G4733 Obstructive sleep apnea (adult) (pediatric): Secondary | ICD-10-CM | POA: Diagnosis not present

## 2022-04-20 DIAGNOSIS — Q2112 Patent foramen ovale: Secondary | ICD-10-CM

## 2022-04-20 DIAGNOSIS — G43719 Chronic migraine without aura, intractable, without status migrainosus: Secondary | ICD-10-CM

## 2022-04-20 DIAGNOSIS — R519 Headache, unspecified: Secondary | ICD-10-CM

## 2022-04-21 NOTE — Progress Notes (Signed)
[facility]   HOME SLEEP TEST REPORT ( by Watch PAT)   STUDY DATE:  04-21-2022 DOB: September 04, 1946    ORDERING CLINICIAN: Melvyn Novas, MD  REFERRING CLINICIAN: Dr Lucia Gaskins, MD   CLINICAL INFORMATION/HISTORY:  After getting 7/8 hours of sleep, this patient described waking up always once during the night. When she wakes up she has headaches. Husband has witnessed apnea and snoring. Follow up on Sleep study.  CPAP machine was prescribed and meanwhile pressure was adjusted ,a new mask has been tried.  She still quit using CPAP after her anxiety was exacerbated. Here to repeat sleep study and try again.       HISTORY OF PRESENT ILLNESS:  [name] is a 76 y.o. 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 Her sleep study was a HST , dated 11-26-2021- This HST confirms the presence of severe sleep apnea with REM accentuation and with supine accentuation of AHI. No significant hypoxia.  She feels she sleeps better since her PFO closure.  She is not tolerating CPAP and she returned the machine to DME , now sleeping on a wedge. [name] is her next goal, evaluation with Dr Jenne Pane in 03-2022.      Epworth sleepiness score: 3/24. FSS at 24/63 points.    BMI:28.3 kg/m   Neck Circumference: 15"   FINDINGS:   Sleep Summary:   Total Recording Time (hours, min):   7 hours and 37 minutes of which the total sleep time amounted to 6 hours and 23 minutes.               Percent REM (%):   22.5%                                     Respiratory Indices:   Calculated pAHI (per hour):   23.1/h                          REM pAHI:    27.1/h                                             NREM pAHI:    21.9/h                          Positional AHI: The patient slept most of the night in the left lateral position with an AHI of 23.4/h and a shorter amount of time with an  AHI of 22.2/h in supine sleep position.  There was no significant snoring recorded the mean volume was 40 dB which is at threshold, and only 6.5% of total sleep time were associated with mild snoring.                                                Oxygen Saturation Statistics:  O2 Saturation Range (%): Between a nadir at 84% and a maximum oxygen saturation of 99% and a mean  saturation at 93%                                     O2 Saturation (minutes) <89%: 0 minutes         Pulse Rate Statistics:   Pulse Mean (bpm):   57 bpm              Pulse Range: Between 44 and 101 bpm                IMPRESSION:  This HST confirms the presence of moderate sleep apnea with which is slightly higher in REM sleep than in non-REM sleep, is not associated with hypoxia.  I believe that the patient could have a significant reduction in apnea during non-REM sleep, and possibly achieving an AHI of less than 7/h overall.   RECOMMENDATION: The patient should discuss her operative risk and the procedure itself with Dr. Jenne Pane, MD at [facility].  Sleep-related headaches may not improve under inspire. Snoring was minimally present.  Snoring usually responds well to inspire, but in this case cannot serve as guidance.    INTERPRETING PHYSICIAN:   Melvyn Novas, MD   Medical Director of [facility] at [facility].

## 2022-04-24 NOTE — Progress Notes (Signed)
She feels she sleeps better since her PFO closure. She is not tolerating CPAP and she returned the machine to DME , now sleeping on a wedge. [name] is her next goal, evaluation with Dr Jenne Pane in 03-2022.  This HST confirms the presence of milder, moderate sleep apnea with which is slightly higher in REM sleep than in non-REM sleep, is not associated with hypoxia. There is a reduction in AHI in comparison to last HST.   [name] is an option for this range of AHI : I believe that the patient could have a significant reduction in apnea during non-REM sleep, and possibly achieving an AHI of less than 7/h overall.  RECOMMENDATION: The patient should discuss her operative risk and the procedure itself with Dr. Jenne Pane, MD at [facility].   Sleep-related headaches may not improve under inspire. Snoring was minimal.  Snoring usually responds well to inspire, but in this case cannot serve as guidance.

## 2022-04-24 NOTE — Procedures (Signed)
[facility] at [facility]   HOME SLEEP TEST REPORT ( by Watch PAT)   STUDY DATE:  04-21-2022 DOB: 11-16-1945    ORDERING CLINICIAN: Melvyn Novas, MD  REFERRING CLINICIAN: Dr Lucia Gaskins, MD   CLINICAL INFORMATION/HISTORY:  After getting 7/8 hours of sleep, this patient described waking up always once during the night. When she wakes up she has headaches. Husband has witnessed apnea and snoring. HST in March was positive for OSA,  CPAP machine was prescribed and meanwhile pressure was adjusted ,a new mask has been tried.  She still quit using CPAP after her anxiety was exacerbated. Follow up on alternative treatments. Here to repeat sleep study and try again.       HISTORY OF PRESENT ILLNESS:  [name] is a 76 y.o. 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 Her sleep study was a HST , dated 11-26-2021- That HST confirmed the presence of severe sleep apnea with REM accentuation and with supine accentuation of AHI. No significant hypoxia.  She feels she sleeps better since her PFO closure.  She is not tolerating CPAP and she returned the machine to DME , now sleeping on a wedge. [name] is her next goal, evaluation with Dr Jenne Pane in 03-2022.      Epworth sleepiness score: 3/24. FSS at 24/63 points.    BMI:28.3 kg/m   Neck Circumference: 15"   FINDINGS:   Sleep Summary:   Total Recording Time (hours, min):   7 hours and 37 minutes of which the total sleep time amounted to 6 hours and 23 minutes.               Percent REM (%):   22.5%                                     Respiratory Indices:   Calculated pAHI (per hour):   23.1/h                          REM pAHI:    27.1/h                                             NREM pAHI:    21.9/h                          Positional AHI: The patient slept most of the night in the left lateral position with an AHI of 23.4/h  and a shorter amount of time with an AHI of 22.2/h in supine sleep position.  There was no significant snoring recorded the mean volume was 40 dB which is at threshold, and only 6.5% of total sleep time were associated with mild snoring.                                                Oxygen Saturation Statistics:  O2 Saturation Range (%): Between a nadir at 84% and a maximum oxygen saturation of 99% and  a mean saturation at 93%                                     O2 Saturation (minutes) <89%: 0 minutes         Pulse Rate Statistics:   Pulse Mean (bpm):   57 bpm              Pulse Range: Between 44 and 101 bpm                IMPRESSION:  This HST confirms the presence of milder, moderate sleep apnea with which is slightly higher in REM sleep than in non-REM sleep, is not associated with hypoxia. There is a reduction in AHI in comparison to last HST.   [name] is an option for this range of AHI : I believe that the patient could have a significant reduction in apnea during non-REM sleep, and possibly achieving an AHI of less than 7/h overall.   RECOMMENDATION: The patient should discuss her operative risk and the procedure itself with Dr. Jenne Pane, MD at [facility].   Sleep-related headaches may not improve under inspire. Snoring was minimal.  Snoring usually responds well to inspire, but in this case cannot serve as guidance.    INTERPRETING PHYSICIAN:   Melvyn Novas, MD   Medical Director of [facility] at [facility].

## 2022-04-26 ENCOUNTER — Encounter: Payer: Self-pay | Admitting: Neurology

## 2022-04-26 ENCOUNTER — Telehealth: Payer: Self-pay | Admitting: Neurology

## 2022-04-26 NOTE — Telephone Encounter (Signed)
Called patient to discuss sleep study results. No answer at this time. LVM for the patient to call back.  Will send a mychatr message to the patient as well.

## 2022-04-26 NOTE — Telephone Encounter (Signed)
Pt returned call and I was able to review the sleeps study with her. She is unsure if her initial consult was with Dr Jenne Pane or not and I advised the note appears it was signed by him. She will look into getting scheduled with them for a follow up to discuss the results from study and moving forward with inspire.

## 2022-04-26 NOTE — Telephone Encounter (Signed)
-----   Message from Melvyn Novas, MD sent at 04/24/2022  1:19 PM EDT ----- She feels she sleeps better since her PFO closure. She is not tolerating CPAP and she returned the machine to DME , now sleeping on a wedge. [name] is her next goal, evaluation with Dr Jenne Pane in 03-2022.  This HST confirms the presence of milder, moderate sleep apnea with which is slightly higher in REM sleep than in non-REM sleep, is not associated with hypoxia. There is a reduction in AHI in comparison to last HST.   [name] is an option for this range of AHI : I believe that the patient could have a significant reduction in apnea during non-REM sleep, and possibly achieving an AHI of less than 7/h overall.  RECOMMENDATION: The patient should discuss her operative risk and the procedure itself with Dr. Jenne Pane, MD at [facility].   Sleep-related headaches may not improve under inspire. Snoring was minimal.  Snoring usually responds well to inspire, but in this case cannot serve as guidance.

## 2022-04-29 ENCOUNTER — Telehealth: Payer: Self-pay | Admitting: Adult Health

## 2022-04-29 NOTE — Telephone Encounter (Signed)
Rescheduled 11/22 appt with pt over the phone- Amy out. Pt has seen Megan in the past, r/s with her.

## 2022-06-21 ENCOUNTER — Other Ambulatory Visit: Payer: Self-pay | Admitting: Otolaryngology

## 2022-07-12 DIAGNOSIS — I10 Essential (primary) hypertension: Secondary | ICD-10-CM | POA: Insufficient documentation

## 2022-07-20 ENCOUNTER — Encounter (HOSPITAL_BASED_OUTPATIENT_CLINIC_OR_DEPARTMENT_OTHER): Payer: Self-pay | Admitting: Otolaryngology

## 2022-07-20 ENCOUNTER — Other Ambulatory Visit: Payer: Self-pay

## 2022-07-21 ENCOUNTER — Encounter (HOSPITAL_BASED_OUTPATIENT_CLINIC_OR_DEPARTMENT_OTHER)
Admission: RE | Admit: 2022-07-21 | Discharge: 2022-07-21 | Disposition: A | Discharge status: Home / Self Care | Payer: Medicare HMO | Source: Ambulatory Visit | Attending: Otolaryngology | Admitting: Otolaryngology

## 2022-07-21 DIAGNOSIS — Z01810 Encounter for preprocedural cardiovascular examination: Secondary | ICD-10-CM | POA: Insufficient documentation

## 2022-07-21 NOTE — Progress Notes (Signed)
Per Angie at Dr Redmond Baseman office, pt does not need to hold their Plavix or ASA

## 2022-07-21 NOTE — Progress Notes (Signed)

## 2022-07-27 NOTE — Anesthesia Preprocedure Evaluation (Signed)
Anesthesia Evaluation  Patient identified by MRN, date of birth, ID band Patient awake    Reviewed: Allergy & Precautions, NPO status , Patient's Chart, lab work & pertinent test results  History of Anesthesia Complications Negative for: history of anesthetic complications  Airway Mallampati: II  TM Distance: >3 FB Neck ROM: Full    Dental no notable dental hx.    Pulmonary sleep apnea , former smoker   Pulmonary exam normal        Cardiovascular negative cardio ROS Normal cardiovascular exam     Neuro/Psych  Headaches  Anxiety     CVA    GI/Hepatic Neg liver ROS,GERD  Medicated and Controlled,,  Endo/Other  Hypothyroidism    Renal/GU negative Renal ROS  negative genitourinary   Musculoskeletal negative musculoskeletal ROS (+)    Abdominal   Peds  Hematology negative hematology ROS (+)   Anesthesia Other Findings Day of surgery medications reviewed with patient.  Reproductive/Obstetrics negative OB ROS                              Anesthesia Physical Anesthesia Plan  ASA: 3  Anesthesia Plan: MAC   Post-op Pain Management: Minimal or no pain anticipated   Induction:   PONV Risk Score and Plan: 2 and Treatment may vary due to age or medical condition, TIVA and Propofol infusion  Airway Management Planned: Natural Airway  Additional Equipment: None  Intra-op Plan:   Post-operative Plan:   Informed Consent: I have reviewed the patients History and Physical, chart, labs and discussed the procedure including the risks, benefits and alternatives for the proposed anesthesia with the patient or authorized representative who has indicated his/her understanding and acceptance.       Plan Discussed with: CRNA  Anesthesia Plan Comments:         Anesthesia Quick Evaluation

## 2022-07-28 ENCOUNTER — Ambulatory Visit (HOSPITAL_BASED_OUTPATIENT_CLINIC_OR_DEPARTMENT_OTHER): Payer: Medicare HMO | Admitting: Anesthesiology

## 2022-07-28 ENCOUNTER — Encounter (HOSPITAL_BASED_OUTPATIENT_CLINIC_OR_DEPARTMENT_OTHER): Payer: Self-pay | Admitting: Otolaryngology

## 2022-07-28 ENCOUNTER — Encounter (HOSPITAL_BASED_OUTPATIENT_CLINIC_OR_DEPARTMENT_OTHER)
Admission: RE | Disposition: A | Discharge status: Home / Self Care | Payer: Self-pay | Source: Home / Self Care | Attending: Otolaryngology

## 2022-07-28 ENCOUNTER — Other Ambulatory Visit: Payer: Self-pay

## 2022-07-28 ENCOUNTER — Ambulatory Visit (HOSPITAL_BASED_OUTPATIENT_CLINIC_OR_DEPARTMENT_OTHER)
Admission: RE | Admit: 2022-07-28 | Discharge: 2022-07-28 | Disposition: A | Discharge status: Home / Self Care | Payer: Medicare HMO | Attending: Otolaryngology | Admitting: Otolaryngology

## 2022-07-28 DIAGNOSIS — Z87891 Personal history of nicotine dependence: Secondary | ICD-10-CM | POA: Diagnosis not present

## 2022-07-28 DIAGNOSIS — G4733 Obstructive sleep apnea (adult) (pediatric): Secondary | ICD-10-CM

## 2022-07-28 DIAGNOSIS — K219 Gastro-esophageal reflux disease without esophagitis: Secondary | ICD-10-CM | POA: Diagnosis not present

## 2022-07-28 DIAGNOSIS — Z8673 Personal history of transient ischemic attack (TIA), and cerebral infarction without residual deficits: Secondary | ICD-10-CM | POA: Diagnosis not present

## 2022-07-28 DIAGNOSIS — F419 Anxiety disorder, unspecified: Secondary | ICD-10-CM | POA: Diagnosis not present

## 2022-07-28 DIAGNOSIS — E039 Hypothyroidism, unspecified: Secondary | ICD-10-CM | POA: Diagnosis not present

## 2022-07-28 DIAGNOSIS — Z01818 Encounter for other preprocedural examination: Secondary | ICD-10-CM

## 2022-07-28 HISTORY — DX: Stress incontinence (female) (male): N39.3

## 2022-07-28 HISTORY — DX: Sleep apnea, unspecified: G47.30

## 2022-07-28 HISTORY — DX: Hypothyroidism, unspecified: E03.9

## 2022-07-28 HISTORY — PX: DRUG INDUCED ENDOSCOPY: SHX6808

## 2022-07-28 HISTORY — DX: Gastro-esophageal reflux disease without esophagitis: K21.9

## 2022-07-28 HISTORY — DX: Patent foramen ovale: Q21.12

## 2022-07-28 LAB — COLOGUARD: COLOGUARD: NEGATIVE

## 2022-07-28 SURGERY — DRUG INDUCED SLEEP ENDOSCOPY
Anesthesia: Monitor Anesthesia Care | Site: Nose | Laterality: Bilateral

## 2022-07-28 MED ORDER — LACTATED RINGERS IV SOLN: INTRAVENOUS | Status: DC

## 2022-07-28 MED ORDER — ONDANSETRON HCL 4 MG/2ML IJ SOLN
INTRAMUSCULAR | Status: DC | PRN
  Administered 2022-07-28: 4 mg via INTRAVENOUS

## 2022-07-28 MED ORDER — PROPOFOL 500 MG/50ML IV EMUL
INTRAVENOUS | Status: DC | PRN
  Administered 2022-07-28: 35 ug/kg/min via INTRAVENOUS

## 2022-07-28 MED ORDER — OXYMETAZOLINE HCL 0.05 % NA SOLN
NASAL | Status: DC | PRN
  Administered 2022-07-28: 1 via TOPICAL

## 2022-07-28 MED ORDER — LIDOCAINE 2% (20 MG/ML) 5 ML SYRINGE
INTRAMUSCULAR | Status: DC | PRN
  Administered 2022-07-28: 60 mg via INTRAVENOUS

## 2022-07-28 SURGICAL SUPPLY — 12 items
CANISTER SUCT 1200ML W/VALVE (MISCELLANEOUS) ×1 IMPLANT
GLOVE BIO SURGEON STRL SZ7.5 (GLOVE) ×1 IMPLANT
KIT CLEAN ENDO (MISCELLANEOUS) ×1 IMPLANT
NDL HYPO 27GX1-1/4 (NEEDLE) IMPLANT
NEEDLE HYPO 27GX1-1/4 (NEEDLE) IMPLANT
PATTIES SURGICAL .5 X3 (DISPOSABLE) ×1 IMPLANT
SHEET MEDIUM DRAPE 40X70 STRL (DRAPES) ×1 IMPLANT
SOL ANTI FOG 6CC (MISCELLANEOUS) ×1 IMPLANT
SOLUTION ANTI FOG 6CC (MISCELLANEOUS) ×1
SYR CONTROL 10ML LL (SYRINGE) IMPLANT
TOWEL GREEN STERILE FF (TOWEL DISPOSABLE) ×1 IMPLANT
TUBE CONNECTING 20X1/4 (TUBING) ×1 IMPLANT

## 2022-07-28 NOTE — Discharge Instructions (Signed)

## 2022-07-28 NOTE — H&P (Signed)
[name] is an 76 y.o. female.   Chief Complaint: Sleep apnea HPI: 76 year old female with obstructive sleep apnea who has been unable to tolerate CPAP.  Past Medical History:  Diagnosis Date   Anxiety    Dysphagia    GERD (gastroesophageal reflux disease)    HYPERTHYROIDISM    Hypothyroidism    LYMPHADENOPATHY    MIGRAINE HEADACHE    Neuromuscular disorder (HCC)    condition where muscle separates from bone left chest, quarter size, pain intermittent   PFO (patent foramen ovale)    closure 08-2021   Sleep apnea    does not use CPAP   Stress incontinence     Past Surgical History:  Procedure Laterality Date   BALLOON DILATION N/A 09/18/2020   Procedure: BALLOON DILATION;  Surgeon: Kerin Salen, MD;  Location: [facility] ENDOSCOPY;  Service: Gastroenterology;  Laterality: N/A;   BUBBLE STUDY  07/03/2021   Procedure: BUBBLE STUDY;  Surgeon: Lewayne Bunting, MD;  Location: [facility] ENDOSCOPY;  Service: Cardiovascular;;   CARDIAC SURGERY     ESOPHAGOGASTRODUODENOSCOPY (EGD) WITH PROPOFOL N/A 09/18/2020   Procedure: ESOPHAGOGASTRODUODENOSCOPY (EGD) WITH PROPOFOL;  Surgeon: Kerin Salen, MD;  Location: [facility] ENDOSCOPY;  Service: Gastroenterology;  Laterality: N/A;   EYE SURGERY Bilateral    Cataract L eye 1-20, R eye 3-20    FOREIGN BODY REMOVAL  09/18/2020   Procedure: FOREIGN BODY REMOVAL;  Surgeon: Kerin Salen, MD;  Location: [facility] ENDOSCOPY;  Service: Gastroenterology;;   MOHS SURGERY  2001   PATENT FORAMEN OVALE(PFO) CLOSURE N/A 08/28/2021   Procedure: PATENT FORAMEN OVALE (PFO) CLOSURE;  Surgeon: Tonny Bollman, MD;  Location: [facility] INVASIVE CV LAB;  Service: Cardiovascular;  Laterality: N/A;   REVERSE SHOULDER ARTHROPLASTY Right 03/16/2019   Procedure: REVERSE SHOULDER ARTHROPLASTY;  Surgeon: Beverely Low, MD;  Location: [facility] ORS;  Service: Orthopedics;  Laterality: Right;  interscalene block   TEE WITHOUT CARDIOVERSION N/A 07/03/2021   Procedure: TRANSESOPHAGEAL ECHOCARDIOGRAM (TEE);   Surgeon: Lewayne Bunting, MD;  Location: [facility] ENDOSCOPY;  Service: Cardiovascular;  Laterality: N/A;   TUBAL LIGATION      Family History  Problem Relation Age of Onset   Heart disease Mother    Stroke Father    Arthritis Sister    Diabetes Brother    Arthritis Brother    Neuropathy Brother    Neuropathy Sister    Diabetes Brother    Social History:  reports that she quit smoking about 54 years ago. Her smoking use included cigarettes. She has never used smokeless tobacco. She reports that she does not drink alcohol and does not use drugs.  Allergies:  Allergies  Allergen Reactions   Oxycodone Other (See Comments)    Hallucinations    Aspirin Other (See Comments)    Interferes with headaches   Imipenem Other (See Comments)    Warm and tingly all over   Statins Other (See Comments)    leg pain, muscle cramps   Cephalexin Other (See Comments)    Stomach cramps   Clindamycin/Lincomycin Nausea And Vomiting and Rash   Contrast Media [Iodinated Contrast Media] Rash   Losartan Potassium-Hctz Other (See Comments)    Unknown Reaction - pt is unaware of this    Methylprednisolone Rash    Rash on face   Penicillins Rash    Did it involve swelling of the face/tongue/throat, SOB, or low BP? No Did it involve sudden or severe rash/hives, skin peeling, or any reaction on the inside of your mouth or  nose? Yes Did you need to seek medical attention at a hospital or doctor's office? No When did it last happen?      2011 If all above answers are "NO", may proceed with cephalosporin use.    Quinolones Rash    Medications Prior to Admission  Medication Sig Dispense Refill   acetaminophen (TYLENOL) 500 MG tablet Take 1,000 mg by mouth every 4 (four) hours as needed for headache.     Calcium Carbonate+Vitamin D 600-200 MG-UNIT TABS Take 1 tablet by mouth 2 (two) times daily.     carboxymethylcellulose (REFRESH PLUS) 0.5 % SOLN Place 1 drop into both eyes 3 (three) times daily as needed  (dry eyes).     clopidogrel (PLAVIX) 75 MG tablet Take 1 tablet (75 mg total) by mouth daily. Start 5 days before PFO closure procedure 90 tablet 1   diphenhydrAMINE (BENADRYL) 25 MG tablet Take 25-50 mg by mouth daily as needed for allergies.     folic acid (FOLVITE) 1 MG tablet Take 2 tablets (2 mg total) by mouth daily.     gabapentin (NEURONTIN) 300 MG capsule Take 1-2 capsules (300-600 mg total) by mouth 4 (four) times daily as needed. 720 capsule 1   HYDROcodone-acetaminophen (NORCO/VICODIN) 5-325 MG tablet Take 1 tablet by mouth every 4 (four) hours as needed for severe pain.     levothyroxine (SYNTHROID) 112 MCG tablet Take 112 mcg by mouth daily before breakfast.     LORazepam (ATIVAN) 2 MG tablet Take 2 mg by mouth 3 (three) times daily as needed for anxiety.     methocarbamol (ROBAXIN) 750 MG tablet Take 750 mg by mouth 2 (two) times daily as needed for muscle spasms.     Multiple Vitamins-Minerals (MULTIVITAMIN WITH MINERALS) tablet Take 1 tablet by mouth daily.     nystatin (MYCOSTATIN/NYSTOP) powder Apply 1 application topically 3 (three) times daily as needed (irritation).     omeprazole (PRILOSEC) 20 MG capsule Take 20 mg by mouth daily.     oxybutynin (DITROPAN-XL) 10 MG 24 hr tablet Take 10 mg by mouth at bedtime.     polycarbophil (FIBERCON) 625 MG tablet Take 625 mg by mouth in the morning and at bedtime.     sertraline (ZOLOFT) 50 MG tablet Take 50 mg by mouth daily.     triamcinolone cream (KENALOG) 0.1 % 1 application daily as needed (Vaginal irritations).     NONFORMULARY OR COMPOUNDED ITEM Place 1-2 g onto the skin as directed. Baclofen 3%, Clonidine 0.2%, Diclofenac 4%, Gabapentin 6%, Lidocaine 3%.    Apply 1 to 2 g to the affected area 3-4 times daily.  Dispense 240 g with 5 refills.      No results found for this or any previous visit (from the past 48 hour(s)). No results found.  Review of Systems  All other systems reviewed and are negative.   Blood pressure  (!) 145/75, pulse 62, temperature (!) 97 F (36.1 C), temperature source Oral, resp. rate 16, height 5\' 5"  (1.651 m), weight 75 kg, SpO2 98 %. Physical Exam Constitutional:      Appearance: Normal appearance. She is normal weight.  HENT:     Head: Normocephalic and atraumatic.     Right Ear: External ear normal.     Left Ear: External ear normal.     Nose: Nose normal.     Mouth/Throat:     Mouth: Mucous membranes are moist.     Pharynx: Oropharynx is clear.  Eyes:  Extraocular Movements: Extraocular movements intact.     Conjunctiva/sclera: Conjunctivae normal.     Pupils: Pupils are equal, round, and reactive to light.  Cardiovascular:     Rate and Rhythm: Normal rate.  Pulmonary:     Effort: Pulmonary effort is normal.  Musculoskeletal:        General: Normal range of motion.     Cervical back: Normal range of motion.  Skin:    General: Skin is warm and dry.  Neurological:     General: No focal deficit present.     Mental Status: She is alert and oriented to person, place, and time.  Psychiatric:        Mood and Affect: Mood normal.        Behavior: Behavior normal.        Thought Content: Thought content normal.        Judgment: Judgment normal.      Assessment/Plan Obstructive sleep apnea and BMI 27.51.  To OR for sleep endoscopy.  Melida Quitter, MD 07/28/2022, 8:17 AM

## 2022-07-28 NOTE — Op Note (Signed)
Preop diagnosis: Obstructive sleep apnea °Postop diagnosis: same °Procedure: Drug-induced sleep endoscopy °Surgeon: Alexxa Sabet °Anesth: IV sedation °Compl: None °Findings: There is 100% anterior-posterior collapse at the velum making her a candidate for hypoglossal nerve stimulator placement.  There was also moderate anterior-posterior collapse at the tongue base. °Description:  After discussing risks, benefits, and alternatives, the patient was brought to the operative suite and placed on the operative table in the supine position.  Anesthesia was induced and the patient was given light sedation to simulate natural sleep. When the proper level was reached, an Afrin-soaked pledget was placed in the right nasal passage for a couple of minutes and then removed.  The fiberoptic laryngoscope was then passed to view the pharynx and larynx.  Findings are noted above and the exam was recorded.  After completion, the scope was removed and the patient was returned to anesthesia for wakeup and was moved to the recovery room in stable condition. ° °

## 2022-07-28 NOTE — Anesthesia Postprocedure Evaluation (Signed)
Anesthesia Post Note  Patient: [name]  Procedure(s) Performed: DRUG INDUCED ENDOSCOPY (Bilateral: Nose)     Patient location during evaluation: PACU Anesthesia Type: MAC Level of consciousness: awake and alert Pain management: pain level controlled Vital Signs Assessment: post-procedure vital signs reviewed and stable Respiratory status: spontaneous breathing, nonlabored ventilation and respiratory function stable Cardiovascular status: blood pressure returned to baseline Postop Assessment: no apparent nausea or vomiting Anesthetic complications: no   No notable events documented.  Last Vitals:  Vitals:   07/28/22 0739 07/28/22 0931  BP: (!) 145/75 138/68  Pulse: 62 (!) 50  Resp: 16 16  Temp: (!) 36.1 C 36.7 C  SpO2: 98% 96%    Last Pain:  Vitals:   07/28/22 0931  TempSrc: Oral  PainSc: 0-No pain                 Marthenia Rolling

## 2022-07-28 NOTE — Transfer of Care (Signed)
Immediate Anesthesia Transfer of Care Note  Patient: [name]  Procedure(s) Performed: DRUG INDUCED ENDOSCOPY (Bilateral: Nose)  Patient Location: PACU  Anesthesia Type:MAC  Level of Consciousness: awake, alert , oriented, and drowsy  Airway & Oxygen Therapy: Patient Spontanous Breathing  Post-op Assessment: Report given to RN and Post -op Vital signs reviewed and stable  Post vital signs: Reviewed and stable  Last Vitals:  Vitals Value Taken Time  BP    Temp    Pulse    Resp    SpO2      Last Pain:  Vitals:   07/28/22 0739  TempSrc: Oral  PainSc: 3       Patients Stated Pain Goal: 5 (59/92/34 1443)  Complications: No notable events documented.

## 2022-07-28 NOTE — Brief Op Note (Signed)
07/28/2022  9:14 AM  PATIENT:  [name]  76 y.o. female  PRE-OPERATIVE DIAGNOSIS:  Obstructive Sleep Apnea BMI 27.0-27.9,adult Z68.27  POST-OPERATIVE DIAGNOSIS:  Obstructive Sleep Apnea  PROCEDURE:  Procedure(s): DRUG INDUCED ENDOSCOPY (Bilateral)  SURGEON:  Surgeon(s) and Role:    Christia Reading, MD - Primary  PHYSICIAN ASSISTANT:   ASSISTANTS: none   ANESTHESIA:   IV sedation  EBL:  None   BLOOD ADMINISTERED:none  DRAINS: none   LOCAL MEDICATIONS USED:  NONE  SPECIMEN:  No Specimen  DISPOSITION OF SPECIMEN:  N/A  COUNTS:  YES  TOURNIQUET:  * No tourniquets in log *  DICTATION: .Note written in EPIC  PLAN OF CARE: Discharge to home after PACU  PATIENT DISPOSITION:  PACU - hemodynamically stable.   Delay start of Pharmacological VTE agent (>24hrs) due to surgical blood loss or risk of bleeding: no

## 2022-07-28 NOTE — Anesthesia Procedure Notes (Signed)
Procedure Name: MAC Date/Time: 07/28/2022 9:13 AM  Performed by: Signe Colt, CRNAPre-anesthesia Checklist: Patient identified, Emergency Drugs available, Suction available, Patient being monitored and Timeout performed Patient Re-evaluated:Patient Re-evaluated prior to induction Oxygen Delivery Method: Simple face mask

## 2022-07-29 ENCOUNTER — Encounter (HOSPITAL_BASED_OUTPATIENT_CLINIC_OR_DEPARTMENT_OTHER): Payer: Self-pay | Admitting: Otolaryngology

## 2022-08-10 ENCOUNTER — Encounter: Payer: Self-pay | Admitting: Adult Health

## 2022-08-10 ENCOUNTER — Ambulatory Visit: Payer: Medicare HMO | Admitting: Adult Health

## 2022-08-10 VITALS — Ht 65.0 in | Wt 166.4 lb

## 2022-08-10 DIAGNOSIS — G629 Polyneuropathy, unspecified: Secondary | ICD-10-CM

## 2022-08-10 DIAGNOSIS — G4762 Sleep related leg cramps: Secondary | ICD-10-CM

## 2022-08-10 NOTE — Progress Notes (Signed)
PATIENT: [name] DOB: [birth year]  REASON FOR VISIT: follow up HISTORY FROM: patient PRIMARY NEUROLOGIST:   Chief Complaint  Patient presents with   Follow-up    Rm 6, alone.  Neuropathy no change.  Gabapentin 300-600mg  qid 2-3-3-2.  Better with some shoes  (sketchers).   Seeing Dr. Jenne Pane for inspire.          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 female who has been followed in this office for neuropathy. Returns today for follow-up. Continues on gabapentin. Working well for neuropathy. On occasion she gets pain that radiates up the inside of both legs more so on the right. Toes are cold. Will wake up nightly with muscle cramps. Doesn't feel like she drinks a lot of water at night. Also gets muscle cramps in the lower back that radiates up the back and into the chest. Uses robaxin and ativan and that relieves symptoms.  Robaxin does not seem to help with muscle cramps during the night.  She returns today for an evaluation.    REVIEW OF SYSTEMS: Out of a complete 14 system review of symptoms, the patient complains only of the following symptoms, and all other reviewed systems are negative.  ALLERGIES: Allergies  Allergen Reactions   Oxycodone Other (See Comments)    Hallucinations    Aspirin Other (See Comments)    Interferes with headaches   Imipenem Other (See Comments)    Warm and tingly all over   Statins Other (See Comments)    leg pain, muscle cramps   Cephalexin Other (See Comments)    Stomach cramps   Clindamycin/Lincomycin Nausea And Vomiting and Rash   Contrast Media [Iodinated Contrast Media] Rash   Losartan Potassium-Hctz Other (See Comments)    Unknown Reaction - pt is unaware of this    Methylprednisolone Rash    Rash on face   Penicillins Rash    Did it involve swelling of the face/tongue/throat, SOB, or low BP? No Did it involve sudden or severe rash/hives, skin peeling, or any reaction on the inside of your  mouth or nose? Yes Did you need to seek medical attention at a hospital or doctor's office? No When did it last happen?      2011 If all above answers are "NO", may proceed with cephalosporin use.    Quinolones Rash    HOME MEDICATIONS: Outpatient Medications Prior to Visit  Medication Sig Dispense Refill   acetaminophen (TYLENOL) 500 MG tablet Take 1,000 mg by mouth every 4 (four) hours as needed for headache.     Calcium Carbonate+Vitamin D 600-200 MG-UNIT TABS Take 1 tablet by mouth 2 (two) times daily.     carboxymethylcellulose (REFRESH PLUS) 0.5 % SOLN Place 1 drop into both eyes 3 (three) times daily as needed (dry eyes).     clopidogrel (PLAVIX) 75 MG tablet Take 1 tablet (75 mg total) by mouth daily. Start 5 days before PFO closure procedure 90 tablet 1   diphenhydrAMINE (BENADRYL) 25 MG tablet Take 25-50 mg by mouth daily as needed for allergies.     folic acid (FOLVITE) 1 MG tablet Take 2 tablets (2 mg total) by mouth daily.     gabapentin (NEURONTIN) 300 MG capsule Take 1-2 capsules (300-600 mg total) by mouth 4 (four) times daily as needed. 720 capsule 1   HYDROcodone-acetaminophen (NORCO/VICODIN) 5-325 MG tablet Take 1 tablet by mouth every 4 (four) hours as needed for severe pain.  levothyroxine (SYNTHROID) 112 MCG tablet Take 112 mcg by mouth daily before breakfast.     LORazepam (ATIVAN) 2 MG tablet Take 2 mg by mouth 3 (three) times daily as needed for anxiety.     methocarbamol (ROBAXIN) 750 MG tablet Take 750 mg by mouth 2 (two) times daily as needed for muscle spasms.     Multiple Vitamins-Minerals (MULTIVITAMIN WITH MINERALS) tablet Take 1 tablet by mouth daily.     nystatin (MYCOSTATIN/NYSTOP) powder Apply 1 application topically 3 (three) times daily as needed (irritation).     omeprazole (PRILOSEC) 20 MG capsule Take 20 mg by mouth daily.     oxybutynin (DITROPAN-XL) 10 MG 24 hr tablet Take 10 mg by mouth at bedtime.     polycarbophil (FIBERCON) 625 MG tablet  Take 625 mg by mouth in the morning and at bedtime.     sertraline (ZOLOFT) 50 MG tablet Take 50 mg by mouth daily.     triamcinolone cream (KENALOG) 0.1 % 1 application daily as needed (Vaginal irritations).     NONFORMULARY OR COMPOUNDED ITEM Place 1-2 g onto the skin as directed. Baclofen 3%, Clonidine 0.2%, Diclofenac 4%, Gabapentin 6%, Lidocaine 3%.    Apply 1 to 2 g to the affected area 3-4 times daily.  Dispense 240 g with 5 refills. (Patient not taking: Reported on 08/10/2022)     No facility-administered medications prior to visit.    PAST MEDICAL HISTORY: Past Medical History:  Diagnosis Date   Anxiety    Dysphagia    GERD (gastroesophageal reflux disease)    HYPERTHYROIDISM    Hypothyroidism    LYMPHADENOPATHY    MIGRAINE HEADACHE    Neuromuscular disorder ([place])    condition where muscle separates from bone left chest, quarter size, pain intermittent   PFO (patent foramen ovale)    closure 08-2021   Sleep apnea    does not use CPAP   Stress incontinence     PAST SURGICAL HISTORY: Past Surgical History:  Procedure Laterality Date   BALLOON DILATION N/A 09/18/2020   Procedure: BALLOON DILATION;  Surgeon: Ronnette Juniper, MD;  Location: [facility];  Service: Gastroenterology;  Laterality: N/A;   BUBBLE STUDY  07/03/2021   Procedure: BUBBLE STUDY;  Surgeon: Lelon Perla, MD;  Location: [facility];  Service: Cardiovascular;;   CARDIAC SURGERY     DRUG INDUCED ENDOSCOPY Bilateral 07/28/2022   Procedure: DRUG INDUCED ENDOSCOPY;  Surgeon: Melida Quitter, MD;  Location: [facility];  Service: ENT;  Laterality: Bilateral;   ESOPHAGOGASTRODUODENOSCOPY (EGD) WITH PROPOFOL N/A 09/18/2020   Procedure: ESOPHAGOGASTRODUODENOSCOPY (EGD) WITH PROPOFOL;  Surgeon: Ronnette Juniper, MD;  Location: [facility];  Service: Gastroenterology;  Laterality: N/A;   EYE SURGERY Bilateral    Cataract L eye 1-20, R eye 3-20    FOREIGN BODY REMOVAL  09/18/2020   Procedure: FOREIGN  BODY REMOVAL;  Surgeon: Ronnette Juniper, MD;  Location: [facility];  Service: Gastroenterology;;   Blanco OVALE(PFO) CLOSURE N/A 08/28/2021   Procedure: PATENT FORAMEN OVALE (PFO) CLOSURE;  Surgeon: Sherren Mocha, MD;  Location: [facility];  Service: Cardiovascular;  Laterality: N/A;   REVERSE SHOULDER ARTHROPLASTY Right 03/16/2019   Procedure: REVERSE SHOULDER ARTHROPLASTY;  Surgeon: Netta Cedars, MD;  Location: [facility];  Service: Orthopedics;  Laterality: Right;  interscalene block   TEE WITHOUT CARDIOVERSION N/A 07/03/2021   Procedure: TRANSESOPHAGEAL ECHOCARDIOGRAM (TEE);  Surgeon: Lelon Perla, MD;  Location: [facility];  Service: Cardiovascular;  Laterality: N/A;   TUBAL LIGATION      FAMILY HISTORY: Family History  Problem Relation Age of Onset   Heart disease Mother    Stroke Father    Arthritis Sister    Diabetes Brother    Arthritis Brother    Neuropathy Brother    Neuropathy Sister    Diabetes Brother     SOCIAL HISTORY: Social History   Socioeconomic History   Marital status: Married    Spouse name: Not on file   Number of children: Not on file   Years of education: Not on file   Highest education level: Not on file  Occupational History   Not on file  Tobacco Use   Smoking status: Former    Types: Cigarettes    Quit date: 1969    Years since quitting: 54.9   Smokeless tobacco: Never  Vaping Use   Vaping Use: Never used  Substance and Sexual Activity   Alcohol use: No   Drug use: No   Sexual activity: Not Currently    Birth control/protection: Post-menopausal  Other Topics Concern   Not on file  Social History Narrative   Lives at home with husband (has mild Dementia) of 55 years   Right handed    Caffeine: 1 cup tea/day   Social Determinants of Health   Financial Resource Strain: Not on file  Food Insecurity: Not on file  Transportation Needs: Not on file  Physical Activity: Not on file  Stress: Not  on file  Social Connections: Not on file  Intimate Partner Violence: Not on file      PHYSICAL EXAM  Vitals:   08/10/22 1132  Weight: 166 lb 6.4 oz (75.5 kg)  Height: 5\' 5"  (1.651 m)   Body mass index is 27.69 kg/m.  Generalized: Well developed, in no acute distress   Neurological examination  Mentation: Alert oriented to time, place, history taking. Follows all commands speech and language fluent Cranial nerve II-XII: Pupils were equal round reactive to light. Extraocular movements were full, visual field were full on confrontational test. Facial sensation and strength were normal.. Head turning and shoulder shrug  were normal and symmetric. Motor: The motor testing reveals 5 over 5 strength of all 4 extremities. Good symmetric motor tone is noted throughout.  Sensory: Sensory testing is intact to soft touch on all 4 extremities.  Pinprick sensation decreased in a stocking-like pattern in both lower extremities.  No evidence of extinction is noted.  Coordination: Cerebellar testing reveals good finger-nose-finger and heel-to-shin bilaterally.  Gait and station: Gait is normal.    DIAGNOSTIC DATA (LABS, IMAGING, TESTING) - I reviewed patient records, labs, notes, testing and imaging myself where available.  Lab Results  Component Value Date   WBC 5.7 11/16/2021   HGB 14.1 11/16/2021   HCT 41.0 11/16/2021   MCV 92.1 11/16/2021   PLT 382 11/16/2021      Component Value Date/Time   NA 139 02/24/2022 1001   K 4.4 02/24/2022 1001   CL 105 02/24/2022 1001   CO2 19 (L) 02/24/2022 1001   GLUCOSE 119 (H) 02/24/2022 1001   GLUCOSE 122 (H) 11/16/2021 0928   BUN 7 (L) 02/24/2022 1001   CREATININE 0.52 (L) 02/24/2022 1001   CREATININE 0.65 06/17/2021 0955   CALCIUM 9.8 02/24/2022 1001   PROT 6.4 02/24/2022 1001   ALBUMIN 4.2 06/17/2021 0955   ALBUMIN 4.5 08/21/2020 1519   AST 16 06/17/2021 0955   ALT 15 06/17/2021 0955  ALKPHOS 44 06/17/2021 0955   BILITOT 0.5 06/17/2021  0955   GFRNONAA >60 11/16/2021 0928   GFRNONAA >60 06/17/2021 0955   GFRAA 111 08/21/2020 1519   Lab Results  Component Value Date   CHOL 158 01/05/2021   HDL 44 01/05/2021   LDLCALC 100 (H) 01/05/2021   TRIG 70 01/05/2021   CHOLHDL 3.6 01/05/2021   Lab Results  Component Value Date   HGBA1C 5.8 (H) 02/24/2022   Lab Results  Component Value Date   VITAMINB12 710 02/24/2022   Lab Results  Component Value Date   TSH 0.902 03/05/2021      ASSESSMENT AND PLAN 76 y.o. year old female  has a past medical history of Anxiety, Dysphagia, GERD (gastroesophageal reflux disease), HYPERTHYROIDISM, Hypothyroidism, LYMPHADENOPATHY, MIGRAINE HEADACHE, Neuromuscular disorder ([facility]), PFO (patent foramen ovale), Sleep apnea, and Stress incontinence. here with:  1.  Neuropathy  -Patient will continue gabapentin 300 to 600 mg 4 times a day. -Discussed that we could do further work-up for nocturnal leg cramps however the patient plans to increase her water intake first to see if she notices benefit. -She requested to follow-up in 1 to 2 months to discuss leg cramps.  If the leg cramps improved she will cancel the appointment.      Ward Givens, MSN, NP-C 08/10/2022, 11:41 AM [facility] [address], [place], [place] [address] [phone]

## 2022-08-10 NOTE — Patient Instructions (Signed)
Your Plan:  Continue gabapentin  FU in 2 months to discuss muscle cramps If your symptoms worsen or you develop new symptoms please let us know.       Thank you for coming to see [place] at [facility]. I hope we have been able to provide you high quality care today.  You may receive a patient satisfaction survey over the next few weeks. We would appreciate your feedback and comments so that we may continue to improve ourselves and the health of our patients.'

## 2022-08-11 ENCOUNTER — Ambulatory Visit: Payer: Medicare HMO | Admitting: Family Medicine

## 2022-08-14 ENCOUNTER — Other Ambulatory Visit: Payer: Self-pay | Admitting: Neurology

## 2022-08-17 ENCOUNTER — Other Ambulatory Visit: Payer: Self-pay | Admitting: Otolaryngology

## 2022-09-23 ENCOUNTER — Ambulatory Visit (HOSPITAL_COMMUNITY): Payer: Medicare HMO | Attending: Internal Medicine

## 2022-09-23 DIAGNOSIS — Q2112 Patent foramen ovale: Secondary | ICD-10-CM | POA: Diagnosis present

## 2022-09-23 LAB — ECHOCARDIOGRAM LIMITED BUBBLE STUDY
Area-P 1/2: 2.85 cm2
S' Lateral: 3.1 cm

## 2022-09-24 ENCOUNTER — Telehealth: Payer: Medicare HMO | Admitting: Adult Health

## 2022-09-24 NOTE — Progress Notes (Signed)
I called patient for PAT and she states that she has a "terrible cold". She complains of sorethroat, cough and congestion. She states her husband is also sick. Chart reviewed with Dr Lissa Hoard and he states that she will need 2 wks minimum to reschedule and she will need complete cessation of symptoms. Angie at Dr Redmond Baseman office made aware.

## 2022-09-24 NOTE — Progress Notes (Signed)
Angie at Dr Iona Beard office notified that we need Plavix and ASA orders on patient for surgery.

## 2022-09-29 ENCOUNTER — Ambulatory Visit (HOSPITAL_BASED_OUTPATIENT_CLINIC_OR_DEPARTMENT_OTHER): Admit: 2022-09-29 | Payer: Medicare HMO | Admitting: Otolaryngology

## 2022-09-29 SURGERY — INSERTION, HYPOGLOSSAL NERVE STIMULATOR
Anesthesia: General | Laterality: Bilateral

## 2022-09-30 IMAGING — CR DG ABDOMEN 1V
1 series · 1 of 1 positions shown · non-contrast
Comparison: Two-view chest on 04/18/2019

CLINICAL DATA: Foreign body in the digestive tract. Abnormal spine
x-ray taken in spine and scoliosis 7-8 weeks ago. Follow-up.

EXAM:
ABDOMEN - 1 VIEW

[t abdomen supine]
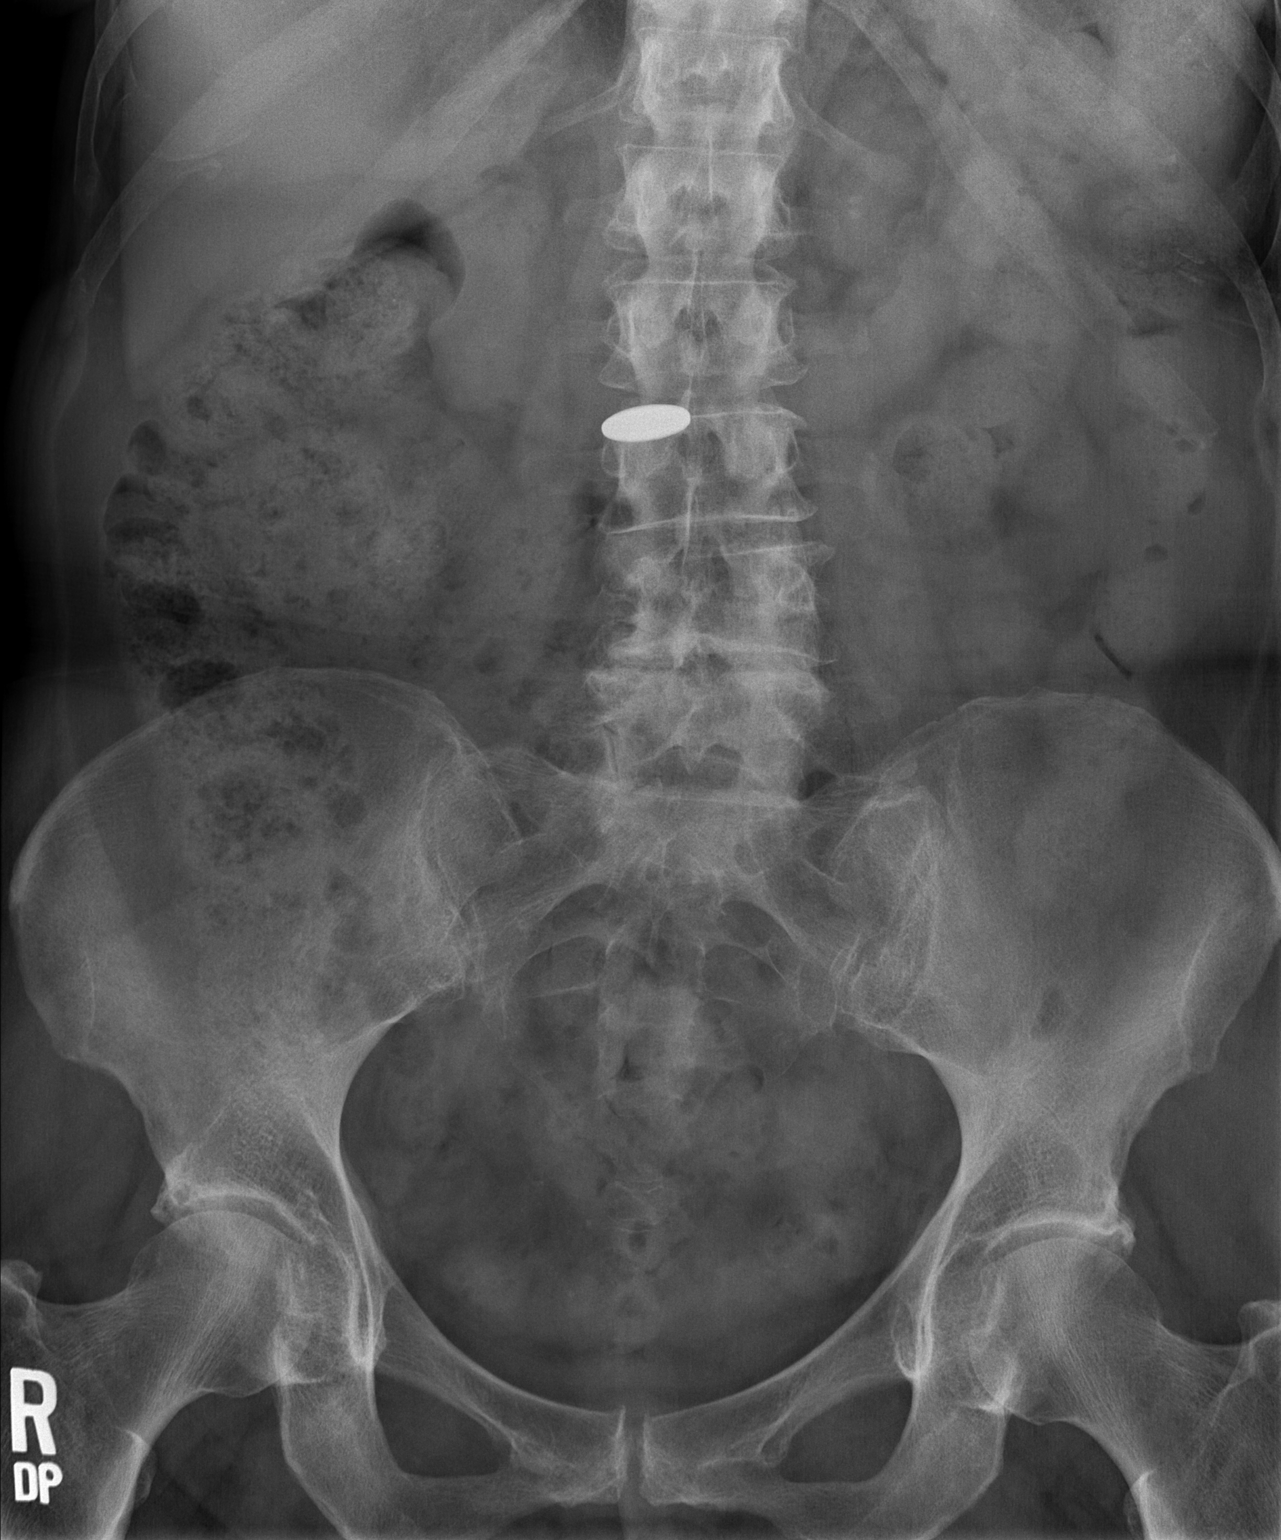

[1 of 1 positions shown; findings below may reference images not displayed]

FINDINGS: Bowel gas pattern is nonobstructive. An oval metallic structure
overlies the lumbar spine at L2-3. Foreign body is 2.4 centimeters
in diameter. This may represent a coin or other metallic object. No
associated bowel obstruction.
IMPRESSION: 1. Nonobstructive bowel gas pattern.
2. Oval metal foreign body overlies the spine at L2-3, possibly
coin.

## 2022-10-06 ENCOUNTER — Telehealth: Payer: Self-pay | Admitting: Adult Health

## 2022-10-06 NOTE — Telephone Encounter (Signed)
Pt said having pain in right and left leg; upper thigh and can hardly walk. Taking Gabapentin, but does not help a lot.  Pt asking for a sooner appt.   Informed pt if nurse has an available appt she will give a call.

## 2022-10-07 NOTE — Telephone Encounter (Signed)
Attempted to fill appt this afternoon at 1500 with pt and she is not able to come today.  On waitlist.

## 2022-10-27 ENCOUNTER — Ambulatory Visit: Payer: Medicare HMO | Admitting: Podiatry

## 2022-10-27 ENCOUNTER — Ambulatory Visit (INDEPENDENT_AMBULATORY_CARE_PROVIDER_SITE_OTHER): Payer: Medicare HMO

## 2022-10-27 DIAGNOSIS — M8580 Other specified disorders of bone density and structure, unspecified site: Secondary | ICD-10-CM | POA: Diagnosis not present

## 2022-10-27 DIAGNOSIS — M2032 Hallux varus (acquired), left foot: Secondary | ICD-10-CM

## 2022-10-27 DIAGNOSIS — M2042 Other hammer toe(s) (acquired), left foot: Secondary | ICD-10-CM

## 2022-10-29 ENCOUNTER — Encounter: Payer: Self-pay | Admitting: Podiatry

## 2022-10-29 ENCOUNTER — Telehealth: Payer: Self-pay | Admitting: Cardiovascular Disease

## 2022-10-29 NOTE — Telephone Encounter (Signed)
   Pre-operative Risk Assessment    Patient Name: [name]  DOB: Jun 04, 1946 MRN: [id number]      Request for Surgical Clearance    Procedure:   Matatarsal Osteotomy with Hammer Toe Correction and Hallux Joint Fusion   Date of Surgery:  Clearance 11/19/22                                 Surgeon:  Dr. Lanae Crumbly  Surgeon's Group or Practice Name:  [facility]  Phone number:  [phone] Fax number:  [phone]   Type of Clearance Requested:   - Medical  - Pharmacy:  Hold    Would like to know if something is to be held.    Type of Anesthesia:   Choice    Additional requests/questions:  Please advise surgeon/provider what medications should be held.  Signed, April Henson   10/29/2022, 10:04 AM

## 2022-10-29 NOTE — Telephone Encounter (Signed)
   Name: [name]  DOB: 01/10/1946  MRN: [id number]  Primary Cardiologist: None  Chart reviewed as part of pre-operative protocol coverage. Because of Latika M Dial's past medical history and time since last visit, she will require a follow-up in-office visit in order to better assess preoperative cardiovascular risk.  Pre-op covering staff: - Please schedule appointment and call patient to inform them. If patient already had an upcoming appointment within acceptable timeframe, please add "pre-op clearance" to the appointment notes so provider is aware. - Please contact requesting surgeon's office via preferred method (i.e, phone, fax) to inform them of need for appointment prior to surgery.  -Guidance for holding Plavix will need to come from neurology provider since patient is taking this for history of stroke.  Mable Fill, Marissa Nestle, NP  10/29/2022, 10:17 AM

## 2022-10-29 NOTE — Telephone Encounter (Signed)
Left message for the pt to call back and schedule an appt in office for pre op clearance. Pt was seeing DR. Tamala Julian, though will need to est with another gen card, in the meantime can see APP for pre op clearance.

## 2022-10-30 LAB — VITAMIN D 25 HYDROXY (VIT D DEFICIENCY, FRACTURES): Vit D, 25-Hydroxy: 43.1 ng/mL (ref 30.0–100.0)

## 2022-10-31 NOTE — Progress Notes (Signed)
  Subjective:  Patient ID: [name], female    DOB: March 04, 1946,  MRN: [id number]  Chief Complaint  Patient presents with   Hammer Toe    np left great toe hammertoes / surgical discussion( ref By Dr Charisse March) - patient had xrays last week at [facility] - Dr. Vivia Birmingham office told her they sent the xrays to [place]    77 y.o. female presents with the above complaint. History confirmed with patient.  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 Hallux hammertoe, left   2. Osteopenia, unspecified location   3. Hammertoe of left foot      Plan:  Patient was evaluated and treated and all questions answered.  Discussed the etiology and treatment including surgical and non surgical treatment for her painful great toe and hammertoes.  We reviewed her x-rays.  She has exhausted all non surgical treatment prior to this visit including shoe gear changes and padding.  She desires surgical intervention. We discussed all risks including but not limited to: pain, swelling, infection, scar, numbness which may be temporary or permanent, chronic pain, stiffness, nerve pain or damage, wound healing problems, bone healing problems including delayed or non-union and  recurrence. Specifically we discussed the following procedures: Arthrodesis of the metatarsal phalangeal versus interphalangeal joint of the great toe with possible tendon transfer, shortening osteotomies of the second and possible third metatarsal and hammertoe correction of the second and third toes.. Informed consent was signed today. Surgery will be scheduled at a mutually agreeable date. Information regarding this will be forwarded to our surgery scheduler.  We discussed her overall risk for surgery including her previous PFO which has been addressed and her risk of stroke or cardiac event following surgery.  I would like her to have guidance from her cardiologist and neurologist prior to surgery.  Surgery be scheduled at the hospital.  Vitamin D level ordered   Surgical plan:  Procedure: -Left foot MPJ versus IPJ fusion of the great toe with possible Jones procedure, shortening osteotomies of the second and possible third metatarsal and hammertoe correction of the second and third hammertoe with bone graft from the heel  [facility] -[facility]  Anesthesia plan: -IV sedation with regional block  Postoperative pain plan: - Tylenol 1000 mg every 6 hours,  gabapentin 300 mg every 8 hours x5 days, oxycodone 5 mg 1-2 tabs every 6 hours only as needed  DVT prophylaxis: -She will restart her Plavix postop  WB Restrictions / DME needs: -Partial WB to heel in CAM boot    No follow-ups on file.

## 2022-11-02 NOTE — Telephone Encounter (Signed)
Left message x 2 to call back to schedule in office appt with APP for pre op clearance.

## 2022-11-03 ENCOUNTER — Ambulatory Visit: Payer: Medicare HMO | Admitting: Adult Health

## 2022-11-03 ENCOUNTER — Encounter: Payer: Self-pay | Admitting: Adult Health

## 2022-11-03 VITALS — BP 121/67 | HR 72 | Ht 65.0 in | Wt 167.8 lb

## 2022-11-03 DIAGNOSIS — G629 Polyneuropathy, unspecified: Secondary | ICD-10-CM

## 2022-11-03 NOTE — Progress Notes (Signed)
PATIENT: [name] DOB: [birth year]  REASON FOR VISIT: follow up HISTORY FROM: patient PRIMARY NEUROLOGIST:   Chief Complaint  Patient presents with   Follow-up    Pt in 9  pt here for Neuropathy f/u Pt states sharp pain in both legs . Pain goes to left hip and right thigh Pt states burning in both feet. Pt states both feet stay cold      HISTORY OF PRESENT ILLNESS: Today 11/03/22:  [name] is a 77 y.o. female with a history of neuropathy. Returns today for follow-up. Reports burning pain is in the feet. But will have sharp pain that comes up the inside of the right leg on the inner thigh and the outer thigh of the left leg. No significant changes with gait or balance. No falls. Continues on gabapentin 2 tablets four times a day. Sometimes will not take full dose if she is not having pain.    Will be having surgery on left foot in march d/t hammer toes.  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  08/10/22: [name] is a 77 y.o. female who has been followed in this office for neuropathy. Returns today for follow-up. Continues on gabapentin. Working well for neuropathy. On occasion she gets pain that radiates up the inside of both legs more so on the right. Toes are cold. Will wake up nightly with muscle cramps. Doesn't feel like she drinks a lot of water at night. Also gets muscle cramps in the lower back that radiates up the back and into the chest. Uses robaxin and ativan and that relieves symptoms.  Robaxin does not seem to help with muscle cramps during the night.  She returns today for an evaluation.    REVIEW OF SYSTEMS: Out of a complete 14 system review of symptoms, the patient complains only of the following symptoms, and all other reviewed systems are negative.  ALLERGIES: Allergies  Allergen Reactions   Oxycodone Other (See Comments)    Hallucinations    Aspirin Other (See Comments)    Interferes with headaches    Imipenem Other (See Comments)    Warm and tingly all over   Methocarbamol Rash   Statins Other (See Comments)    leg pain, muscle cramps   Losartan Other (See Comments)   Cephalexin Other (See Comments)    Stomach cramps   Clindamycin/Lincomycin Nausea And Vomiting and Rash   Contrast Media [Iodinated Contrast Media] Rash   Losartan Potassium-Hctz Other (See Comments)    Unknown Reaction - pt is unaware of this    Methylprednisolone Rash    Rash on face   Penicillins Rash    Did it involve swelling of the face/tongue/throat, SOB, or low BP? No Did it involve sudden or severe rash/hives, skin peeling, or any reaction on the inside of your mouth or nose? Yes Did you need to seek medical attention at a hospital or doctor's office? No When did it last happen?      2011 If all above answers are "NO", may proceed with cephalosporin use.    Quinolones Rash    HOME MEDICATIONS: Outpatient Medications Prior to Visit  Medication Sig Dispense Refill   acetaminophen (TYLENOL) 500 MG tablet Take 1,000 mg by mouth every 4 (four) hours as needed for headache.     Calcium Carbonate+Vitamin D 600-200 MG-UNIT TABS Take 1 tablet by mouth 2 (two) times daily.     carboxymethylcellulose (REFRESH  PLUS) 0.5 % SOLN Place 1 drop into both eyes 3 (three) times daily as needed (dry eyes).     clopidogrel (PLAVIX) 75 MG tablet Take 1 tablet (75 mg total) by mouth daily. Start 5 days before PFO closure procedure 90 tablet 1   diphenhydrAMINE (BENADRYL) 25 MG tablet Take 25-50 mg by mouth daily as needed for allergies.     fluconazole (DIFLUCAN) 100 MG tablet Take 100 mg by mouth daily.     folic acid (FOLVITE) 1 MG tablet Take 2 tablets (2 mg total) by mouth daily.     gabapentin (NEURONTIN) 300 MG capsule TAKE 1 TO 2 CAPSULES FOUR TIMES DAILY AS NEEDED 720 capsule 3   HYDROcodone-acetaminophen (NORCO/VICODIN) 5-325 MG tablet Take 1 tablet by mouth every 4 (four) hours as needed for severe pain.      levofloxacin (LEVAQUIN) 500 MG tablet Take 500 mg by mouth daily.     levothyroxine (SYNTHROID) 112 MCG tablet Take 112 mcg by mouth daily before breakfast.     LORazepam (ATIVAN) 2 MG tablet Take 2 mg by mouth 3 (three) times daily as needed for anxiety.     methocarbamol (ROBAXIN) 750 MG tablet Take 750 mg by mouth 2 (two) times daily as needed for muscle spasms.     Multiple Vitamins-Minerals (MULTIVITAMIN WITH MINERALS) tablet Take 1 tablet by mouth daily.     NONFORMULARY OR COMPOUNDED ITEM Place 1-2 g onto the skin as directed. Baclofen 3%, Clonidine 0.2%, Diclofenac 4%, Gabapentin 6%, Lidocaine 3%.    Apply 1 to 2 g to the affected area 3-4 times daily.  Dispense 240 g with 5 refills.     nystatin (MYCOSTATIN/NYSTOP) powder Apply 1 application topically 3 (three) times daily as needed (irritation).     omeprazole (PRILOSEC) 20 MG capsule Take 20 mg by mouth daily.     oxybutynin (DITROPAN-XL) 10 MG 24 hr tablet Take 10 mg by mouth at bedtime.     polycarbophil (FIBERCON) 625 MG tablet Take 625 mg by mouth in the morning and at bedtime.     sertraline (ZOLOFT) 50 MG tablet Take 50 mg by mouth daily.     triamcinolone cream (KENALOG) 0.1 % 1 application daily as needed (Vaginal irritations).     No facility-administered medications prior to visit.    PAST MEDICAL HISTORY: Past Medical History:  Diagnosis Date   Anxiety    Dysphagia    GERD (gastroesophageal reflux disease)    HYPERTHYROIDISM    Hypothyroidism    LYMPHADENOPATHY    MIGRAINE HEADACHE    Neuromuscular disorder ([place])    condition where muscle separates from bone left chest, quarter size, pain intermittent   PFO (patent foramen ovale)    closure 08-2021   Sleep apnea    does not use CPAP   Stress incontinence     PAST SURGICAL HISTORY: Past Surgical History:  Procedure Laterality Date   BALLOON DILATION N/A 09/18/2020   Procedure: BALLOON DILATION;  Surgeon: Ronnette Juniper, MD;  Location: [facility] ENDOSCOPY;  Service:  Gastroenterology;  Laterality: N/A;   BUBBLE STUDY  07/03/2021   Procedure: BUBBLE STUDY;  Surgeon: Lelon Perla, MD;  Location: [facility] ENDOSCOPY;  Service: Cardiovascular;;   CARDIAC SURGERY     DRUG INDUCED ENDOSCOPY Bilateral 07/28/2022   Procedure: DRUG INDUCED ENDOSCOPY;  Surgeon: Melida Quitter, MD;  Location: [facility];  Service: ENT;  Laterality: Bilateral;   ESOPHAGOGASTRODUODENOSCOPY (EGD) WITH PROPOFOL N/A 09/18/2020   Procedure: ESOPHAGOGASTRODUODENOSCOPY (EGD) WITH PROPOFOL;  Surgeon: Ronnette Juniper, MD;  Location: [facility] END[facility];  Service: Gastroenterology;  Laterality: N/A;   EYE SURGERY Bilateral    Cataract L eye 1-20, R eye 3-20    FOREIGN BODY REMOVAL  09/18/2020   Procedure: FOREIGN BODY REMOVAL;  Surgeon: Ronnette Juniper, MD;  Location: [facility] ENDOS[facility];  Service: Gastroenterology;;   [place] OVALE(PFO) CLOSURE N/A 08/28/2021   Procedure: PATENT FORAMEN OVALE (PFO) CLOSURE;  Surgeon: Sherren Mocha, MD;  Location: [facility];  Service: Cardiovascular;  Laterality: N/A;   REVERSE SHOULDER ARTHROPLASTY Right 03/16/2019   Procedure: REVERSE SHOULDER ARTHROPLASTY;  Surgeon: Netta Cedars, MD;  Location: [facility];  Service: Orthopedics;  Laterality: Right;  interscalene block   TEE WITHOUT CARDIOVERSION N/A 07/03/2021   Procedure: TRANSESOPHAGEAL ECHOCARDIOGRAM (TEE);  Surgeon: Lelon Perla, MD;  Location: [facility] ENDOSCOPY;  Service: Cardiovascular;  Laterality: N/A;   TUBAL LIGATION      FAMILY HISTORY: Family History  Problem Relation Age of Onset   Heart disease Mother    Stroke Father    Arthritis Sister    Diabetes Brother    Arthritis Brother    Neuropathy Brother    Neuropathy Sister    Diabetes Brother     SOCIAL HISTORY: Social History   Socioeconomic History   Marital status: Married    Spouse name: Not on file   Number of children: Not on file   Years of education: Not on file   Highest education level: Not on  file  Occupational History   Not on file  Tobacco Use   Smoking status: Former    Types: Cigarettes    Quit date: 1969    Years since quitting: 55.1   Smokeless tobacco: Never  Vaping Use   Vaping Use: Never used  Substance and Sexual Activity   Alcohol use: No   Drug use: No   Sexual activity: Not Currently    Birth control/protection: Post-menopausal  Other Topics Concern   Not on file  Social History Narrative   Lives at home with husband (has mild Dementia) of 38 years   Right handed    Caffeine: 1 cup tea/day   Social Determinants of Health   Financial Resource Strain: Not on file  Food Insecurity: Not on file  Transportation Needs: Not on file  Physical Activity: Not on file  Stress: Not on file  Social Connections: Not on file  Intimate Partner Violence: Not on file      PHYSICAL EXAM  Vitals:   11/03/22 1335  BP: 121/67  Pulse: 72  Weight: 167 lb 12.8 oz (76.1 kg)  Height: 5' 5"$  (1.651 m)   Body mass index is 27.92 kg/m.  Generalized: Well developed, in no acute distress   Neurological examination  Mentation: Alert oriented to time, place, history taking. Follows all commands speech and language fluent Cranial nerve II-XII: Pupils were equal round reactive to light. Extraocular movements were full, visual field were full on confrontational test. Facial sensation and strength were normal.. Head turning and shoulder shrug  were normal and symmetric. Motor: The motor testing reveals 5 over 5 strength of all 4 extremities. Good symmetric motor tone is noted throughout.  Sensory: Sensory testing is intact to soft touch on all 4 extremities.  Pinprick sensation decreased in a stocking-like pattern in both lower extremities.  No evidence of extinction is noted.  Coordination: Cerebellar testing reveals good finger-nose-finger and heel-to-shin bilaterally.  Gait and station: Gait is normal.  DIAGNOSTIC DATA (LABS, IMAGING, TESTING) - I reviewed patient  records, labs, notes, testing and imaging myself where available.  Lab Results  Component Value Date   WBC 5.7 11/16/2021   HGB 14.1 11/16/2021   HCT 41.0 11/16/2021   MCV 92.1 11/16/2021   PLT 382 11/16/2021      Component Value Date/Time   NA 139 02/24/2022 1001   K 4.4 02/24/2022 1001   CL 105 02/24/2022 1001   CO2 19 (L) 02/24/2022 1001   GLUCOSE 119 (H) 02/24/2022 1001   GLUCOSE 122 (H) 11/16/2021 0928   BUN 7 (L) 02/24/2022 1001   CREATININE 0.52 (L) 02/24/2022 1001   CREATININE 0.65 06/17/2021 0955   CALCIUM 9.8 02/24/2022 1001   PROT 6.4 02/24/2022 1001   ALBUMIN 4.2 06/17/2021 0955   ALBUMIN 4.5 08/21/2020 1519   AST 16 06/17/2021 0955   ALT 15 06/17/2021 0955   ALKPHOS 44 06/17/2021 0955   BILITOT 0.5 06/17/2021 0955   GFRNONAA >60 11/16/2021 0928   GFRNONAA >60 06/17/2021 0955   GFRAA 111 08/21/2020 1519   Lab Results  Component Value Date   CHOL 158 01/05/2021   HDL 44 01/05/2021   LDLCALC 100 (H) 01/05/2021   TRIG 70 01/05/2021   CHOLHDL 3.6 01/05/2021   Lab Results  Component Value Date   HGBA1C 5.8 (H) 02/24/2022   Lab Results  Component Value Date   VITAMINB12 710 02/24/2022   Lab Results  Component Value Date   TSH 0.902 03/05/2021      ASSESSMENT AND PLAN 77 y.o. year old female  has a past medical history of Anxiety, Dysphagia, GERD (gastroesophageal reflux disease), HYPERTHYROIDISM, Hypothyroidism, LYMPHADENOPATHY, MIGRAINE HEADACHE, Neuromuscular disorder ([facility]), PFO (patent foramen ovale), Sleep apnea, and Stress incontinence. here with:  1.  Neuropathy  -Patient will continue gabapentin 600 mg 4 times a day.  Advised that she should try to take her medication consistently.  To avoid breakthrough symptoms -Advised that if symptoms do not improve she should let us know. -Follow-up in 6 months or sooner if needed      Ward Givens, MSN, NP-C 11/03/2022, 2:03 PM [facility] [address], [place], [place] [address] [phone]

## 2022-11-03 NOTE — Telephone Encounter (Signed)
Patient scheduled with Nicholes Rough for preop clearance appt on 11/09/22.

## 2022-11-03 NOTE — Telephone Encounter (Signed)
Patient is returning call.  °

## 2022-11-03 NOTE — Telephone Encounter (Signed)
Left message for the patient to contact the office.   Left message on the patients spouse's voicemail advising him to have patient contact the office.   Spoke with the patients son who states he will contact his mom.

## 2022-11-04 ENCOUNTER — Encounter: Payer: Self-pay | Admitting: Neurology

## 2022-11-04 ENCOUNTER — Encounter: Payer: Self-pay | Admitting: Podiatry

## 2022-11-05 ENCOUNTER — Telehealth: Payer: Self-pay | Admitting: Urology

## 2022-11-05 NOTE — Telephone Encounter (Signed)
DOS - 12-01-22   METATARSAL OSTEOTOMY LEFT --- [id number] HAMMERTOE REPAIR 2,3 LEFT --- [id number] HALLUX MPJ FUSION LEFT --- [id number] BONE GRAFT --- [id number]  [facility]  PER [employer] FOR CPT CODES 09811, [phone] AND [phone] HAVE BEEN APPROVED, Authorization [id number], GOOD FROM 12/01/22 - 01-31-2023. CPT CODES 29562 AND 13086 NO AUTH IS REQUIRED.    Tracking [id number]

## 2022-11-08 NOTE — Progress Notes (Unsigned)
Office Visit    Patient Name: [name] Date of Encounter: 11/09/2022  PCP:  Lorene Dy, MD   [name]  Cardiologist:  Dr. Angelena Form Advanced Practice Provider:  No care team member to display Electrophysiologist:  None   HPI    [name] is a 77 y.o. female with a past medical history of DVT, PE, stroke, status post PFO closure presents today for follow-up appointment and preop clearance.   [name] was referred to Dr. Burt Knack for evaluation of PFO closure and she was hospitalized with generalized weakness, found to have leukocytosis and severe anemia with hemoglobin of 5.7.  Ultimately diagnosed with autoimmune hemolytic anemia.  During that hospitalization, she became acutely aphasic and dysarthric.  Brain MRI showed multiple acute infarcts.  Found to have lower extremity DVT and an echocardiogram suggested PE as well.  TEE was positive for shunting.  Placed on PE anticoagulation with Xarelto.  After discharge, she was treated with steroids and rituximab for autoimmune hemolytic anemia.  Ultimately released by hematology and was managed by PCP.  She was then seen by Dr. Burt Knack for evaluation of PFO closure which was subsequently performed 08/28/2021 with 25 mm Amplatzer Talisman PFO closure device.  She was seen in follow-up 09/2021 and was doing well since device placement.  Reported 1 brief episode of left-sided anterior chest pain which was fleeting and associated with anxiety symptoms.  No exertional qualities.  No associated symptoms.  Felt it was musculoskeletal.  Tolerating Plavix without issue.  Today, she presents today for preop clearance.  She does have occasional chest pain but her PCP told her it was costochondritis.  She takes a muscle relaxer and anxiety medications.  It feels more like a muscle spasm.  It is on her right side and sometimes goes to the left.  No shortness of breath.  No palpitations.  She is getting clearance today for  left foot surgery.  She is scored over 4 METS on the DASI.  She has no issues walking longer distance and can even run a short distance.  Her surgery is planned for March 1.  Her cholesterol was checked by her primary.  Her Plavix is prescribed by neurology so clearance will come from them however, okay to hold Plavix for up to 5 days prior to the surgery.  Resume when medically safe to do so.  She tells me that she has tried every lipid-lowering statin medication and it has not made a difference for her LDL.  We discussed Repatha and Praluent.  Offered an appointment with Pharm.D. but patient declined at this time.  She would like to have a conversation with her PCP.  Reports no shortness of breath nor dyspnea on exertion. No edema, orthopnea, PND. Reports no palpitations.    Past Medical History    Past Medical History:  Diagnosis Date   Anxiety    Dysphagia    GERD (gastroesophageal reflux disease)    HYPERTHYROIDISM    Hypothyroidism    LYMPHADENOPATHY    MIGRAINE HEADACHE    Neuromuscular disorder ([place])    condition where muscle separates from bone left chest, quarter size, pain intermittent   PFO (patent foramen ovale)    closure 08-2021   Sleep apnea    does not use CPAP   Stress incontinence    Past Surgical History:  Procedure Laterality Date   BALLOON DILATION N/A 09/18/2020   Procedure: BALLOON DILATION;  Surgeon: Ronnette Juniper, MD;  Location: [facility];  Service: Gastroenterology;  Laterality: N/A;   BUBBLE STUDY  07/03/2021   Procedure: BUBBLE STUDY;  Surgeon: Lelon Perla, MD;  Location: [facility];  Service: Cardiovascular;;   CARDIAC SURGERY     DRUG INDUCED ENDOSCOPY Bilateral 07/28/2022   Procedure: DRUG INDUCED ENDOSCOPY;  Surgeon: Melida Quitter, MD;  Location: [facility];  Service: ENT;  Laterality: Bilateral;   ESOPHAGOGASTRODUODENOSCOPY (EGD) WITH PROPOFOL N/A 09/18/2020   Procedure: ESOPHAGOGASTRODUODENOSCOPY (EGD) WITH PROPOFOL;   Surgeon: Ronnette Juniper, MD;  Location: [facility];  Service: Gastroenterology;  Laterality: N/A;   EYE SURGERY Bilateral    Cataract L eye 1-20, R eye 3-20    FOREIGN BODY REMOVAL  09/18/2020   Procedure: FOREIGN BODY REMOVAL;  Surgeon: Ronnette Juniper, MD;  Location: [facility];  Service: Gastroenterology;;   [place] OVALE(PFO) CLOSURE N/A 08/28/2021   Procedure: PATENT FORAMEN OVALE (PFO) CLOSURE;  Surgeon: Sherren Mocha, MD;  Location: [facility];  Service: Cardiovascular;  Laterality: N/A;   REVERSE SHOULDER ARTHROPLASTY Right 03/16/2019   Procedure: REVERSE SHOULDER ARTHROPLASTY;  Surgeon: Netta Cedars, MD;  Location: [facility];  Service: Orthopedics;  Laterality: Right;  interscalene block   TEE WITHOUT CARDIOVERSION N/A 07/03/2021   Procedure: TRANSESOPHAGEAL ECHOCARDIOGRAM (TEE);  Surgeon: Lelon Perla, MD;  Location: [facility];  Service: Cardiovascular;  Laterality: N/A;   TUBAL LIGATION      Allergies  Allergies  Allergen Reactions   Oxycodone Other (See Comments)    Hallucinations    Aspirin Other (See Comments)    Interferes with headaches  Other Reaction(s): stomach issues   Imipenem Other (See Comments)    Warm and tingly all over   Methocarbamol Rash   Statins Other (See Comments)    leg pain, muscle cramps   Losartan Other (See Comments)   Cephalexin Other (See Comments)    Stomach cramps   Clindamycin/Lincomycin Nausea And Vomiting and Rash   Contrast Media [Iodinated Contrast Media] Rash   Losartan Potassium-Hctz Other (See Comments)    Unknown Reaction - pt is unaware of this  Other Reaction(s): do not remember   Methylprednisolone Rash    Rash on face   Penicillins Rash    Did it involve swelling of the face/tongue/throat, SOB, or low BP? No Did it involve sudden or severe rash/hives, skin peeling, or any reaction on the inside of your mouth or nose? Yes Did you need to seek medical attention at a hospital or  doctor's office? No When did it last happen?      2011 If all above answers are "NO", may proceed with cephalosporin use.    Quinolones Rash     EKGs/Labs/Other Studies Reviewed:   The following studies were reviewed today: Echo bubble study, 09/23/22  IMPRESSIONS     1. Left ventricular ejection fraction, by estimation, is 55 to 60%. The  left ventricle has normal function. Left ventricular diastolic parameters  are consistent with Grade I diastolic dysfunction (impaired relaxation).   2. Right ventricular systolic function is normal. The right ventricular  size is normal. There is normal pulmonary artery systolic pressure.   3. S/p PFO closure with 25 mm amplatzer, well seated, no interatrial  shunt visualized . Agitated saline contrast bubble study was negative,  with no evidence of any interatrial shunt.   4. The aortic valve is tricuspid.   5. The inferior vena cava is normal in size with greater than 50%  respiratory variability, suggesting right atrial pressure of 3 mmHg.   FINDINGS   Left Ventricle: Left ventricular ejection fraction, by estimation, is 55  to 60%. The left ventricle has normal function. There is no left  ventricular hypertrophy. Left ventricular diastolic parameters are  consistent with Grade I diastolic dysfunction  (impaired relaxation).   Right Ventricle: The right ventricular size is normal. Right ventricular  systolic function is normal. There is normal pulmonary artery systolic  pressure. The tricuspid regurgitant velocity is 1.81 m/s, and with an  assumed right atrial pressure of 3 mmHg,   the estimated right ventricular systolic pressure is [id number] mmHg.   Tricuspid Valve: Tricuspid valve regurgitation is not demonstrated.   Aortic Valve: The aortic valve is tricuspid.   Pulmonic Valve: Pulmonic valve regurgitation is trivial.   Aorta: The aortic root and ascending aorta are structurally normal, with  no evidence of dilitation.   Venous:  The inferior vena cava is normal in size with greater than 50%  respiratory variability, suggesting right atrial pressure of 3 mmHg.   IAS/Shunts: Agitated saline contrast bubble study was negative, with no  evidence of any interatrial shunt.   EKG:  EKG is ordered today.  The ekg ordered today demonstrates NSR, rate 74 bpm  Recent Labs: 11/16/2021: Hemoglobin 14.1; Platelets 382 02/24/2022: BUN 7; Creatinine, Ser 0.52; Potassium 4.4; Sodium 139  Recent Lipid Panel    Component Value Date/Time   CHOL 158 01/05/2021 0317   TRIG 70 01/05/2021 0317   HDL 44 01/05/2021 0317   CHOLHDL 3.6 01/05/2021 0317   VLDL 14 01/05/2021 0317   LDLCALC 100 (H) 01/05/2021 0317    Home Medications   Current Meds  Medication Sig   acetaminophen (TYLENOL) 500 MG tablet Take 1,000 mg by mouth every 4 (four) hours as needed for headache.   Calcium Carbonate+Vitamin D 600-200 MG-UNIT TABS Take 1 tablet by mouth 2 (two) times daily.   carboxymethylcellulose (REFRESH PLUS) 0.5 % SOLN Place 1 drop into both eyes 3 (three) times daily as needed (dry eyes).   clopidogrel (PLAVIX) 75 MG tablet Take 1 tablet (75 mg total) by mouth daily. Start 5 days before PFO closure procedure   diphenhydrAMINE (BENADRYL) 25 MG tablet Take 25-50 mg by mouth daily as needed for allergies.   fluconazole (DIFLUCAN) 100 MG tablet Take 100 mg by mouth daily.   folic acid (FOLVITE) 1 MG tablet Take 2 tablets (2 mg total) by mouth daily.   gabapentin (NEURONTIN) 300 MG capsule TAKE 1 TO 2 CAPSULES FOUR TIMES DAILY AS NEEDED   HYDROcodone-acetaminophen (NORCO/VICODIN) 5-325 MG tablet Take 1 tablet by mouth every 4 (four) hours as needed for severe pain.   levothyroxine (SYNTHROID) 112 MCG tablet Take 112 mcg by mouth daily before breakfast.   LORazepam (ATIVAN) 2 MG tablet Take 2 mg by mouth 3 (three) times daily as needed for anxiety.   methocarbamol (ROBAXIN) 750 MG tablet Take 750 mg by mouth 2 (two) times daily as needed for muscle  spasms.   Multiple Vitamins-Minerals (MULTIVITAMIN WITH MINERALS) tablet Take 1 tablet by mouth daily.   NONFORMULARY OR COMPOUNDED ITEM Place 1-2 g onto the skin as directed. Baclofen 3%, Clonidine 0.2%, Diclofenac 4%, Gabapentin 6%, Lidocaine 3%.    Apply 1 to 2 g to the affected area 3-4 times daily.  Dispense 240 g with 5 refills.   nystatin (MYCOSTATIN/NYSTOP) powder Apply 1 application topically 3 (three) times daily as needed (irritation).   omeprazole (PRILOSEC) 20 MG  capsule Take 20 mg by mouth daily.   oxybutynin (DITROPAN-XL) 10 MG 24 hr tablet Take 10 mg by mouth at bedtime.   polycarbophil (FIBERCON) 625 MG tablet Take 625 mg by mouth in the morning and at bedtime.   sertraline (ZOLOFT) 50 MG tablet Take 50 mg by mouth daily.   triamcinolone cream (KENALOG) 0.1 % 1 application daily as needed (Vaginal irritations).     Review of Systems      All other systems reviewed and are otherwise negative except as noted above.  Physical Exam    VS:  BP 120/60   Pulse 74   Ht 5' 5"$  (1.651 m)   Wt 167 lb 9.6 oz (76 kg)   SpO2 93%   BMI 27.89 kg/m  , BMI Body mass index is 27.89 kg/m.  Wt Readings from Last 3 Encounters:  11/09/22 167 lb 9.6 oz (76 kg)  11/03/22 167 lb 12.8 oz (76.1 kg)  08/10/22 166 lb 6.4 oz (75.5 kg)     GEN: Well nourished, well developed, in no acute distress. HEENT: normal. Neck: Supple, no JVD, carotid bruits, or masses. Cardiac: RRR, no murmurs, rubs, or gallops. No clubbing, cyanosis, edema.  Radials/PT 2+ and equal bilaterally.  Respiratory:  Respirations regular and unlabored, clear to auscultation bilaterally. GI: Soft, nontender, nondistended. MS: No deformity or atrophy. Skin: Warm and dry, no rash. Neuro:  Strength and sensation are intact. Psych: Normal affect.  Assessment & Plan    Preop clearance  Ms. Wahler's perioperative risk of a major cardiac event is 0.9% according to the Revised Cardiac Risk Index (RCRI).  Therefore, she is at  low risk for perioperative complications.   Her functional capacity is good at 6.05 METs according to the [place] Activity Status Index (DASI). Recommendations: According to ACC/AHA guidelines, no further cardiovascular testing needed.  The patient may proceed to surgery at acceptable risk.   Antiplatelet and/or Anticoagulation Recommendations: Clopidogrel (Plavix) can be held for 5 days prior to her surgery and resumed as soon as possible post op. Guidance should come from neuro.  PFO closure -no recent issues -continue current medications  Stroke/DVT -on plavix, no asa -no residual effects  MS pain -she has robaxin, ativan as needed, and daily zoloft        Disposition: Follow up 1 year with Dr. Angelena Form or APP.  Signed, Elgie Collard, PA-C 11/09/2022, 9:39 AM [address]

## 2022-11-09 ENCOUNTER — Encounter: Payer: Self-pay | Admitting: Physician Assistant

## 2022-11-09 ENCOUNTER — Ambulatory Visit: Payer: Medicare HMO | Attending: Physician Assistant | Admitting: Physician Assistant

## 2022-11-09 VITALS — BP 120/60 | HR 74 | Ht 65.0 in | Wt 167.6 lb

## 2022-11-09 DIAGNOSIS — Z01818 Encounter for other preprocedural examination: Secondary | ICD-10-CM | POA: Diagnosis not present

## 2022-11-09 DIAGNOSIS — Q2112 Patent foramen ovale: Secondary | ICD-10-CM

## 2022-11-09 DIAGNOSIS — I6310 Cerebral infarction due to embolism of unspecified precerebral artery: Secondary | ICD-10-CM

## 2022-11-09 DIAGNOSIS — R079 Chest pain, unspecified: Secondary | ICD-10-CM

## 2022-11-09 NOTE — Patient Instructions (Addendum)
Medication Instructions:  Your physician recommends that you continue on your current medications as directed. Please refer to the Current Medication list given to you today.  *If you need a refill on your cardiac medications before your next appointment, please call your pharmacy*   Lab Work: Have your primary care provider fax [place] a copy of your lipid panel and any other labs that they draw when you see them [phone] If you have labs (blood work) drawn today and your tests are completely normal, you will receive your results only by: [place] (if you have MyChart) OR A paper copy in the mail If you have any lab test that is abnormal or we need to change your treatment, we will call you to review the results.   Follow-Up: At [facility], you and your health needs are our priority.  As part of our continuing mission to provide you with exceptional heart care, we have created designated Provider Care Teams.  These Care Teams include your primary Cardiologist (physician) and Advanced Practice Providers (APPs -  Physician Assistants and Nurse Practitioners) who all work together to provide you with the care you need, when you need it.   Your next appointment:   1 year(s)  Provider:   Dr Angelena Form

## 2022-11-16 ENCOUNTER — Other Ambulatory Visit: Payer: Self-pay | Admitting: Urology

## 2022-11-17 ENCOUNTER — Encounter (HOSPITAL_COMMUNITY): Payer: Self-pay | Admitting: Podiatry

## 2022-11-17 ENCOUNTER — Other Ambulatory Visit: Payer: Self-pay

## 2022-11-17 NOTE — Progress Notes (Addendum)
PCP - Lorene Dy, MD Cardiologist - Lauree Chandler  EKG - 11/09/22 ECHO - 09/23/22 Cardiac Cath - 08/28/21  CPAP - Does not tolerate  Blood Thinner Instructions: Plavix last dose 02/25  Anesthesia review: Y  Patient verbally denies any shortness of breath, fever, cough and chest pain during phone call   -------------  SDW INSTRUCTIONS given:  Your procedure is scheduled on 11/19/22.  Report to [facility] Main Entrance "A" at 1230 and check in at the Admitting office.  Call this number if you have problems the morning of surgery:  [phone]   Remember:  Do not eat after midnight the night before your surgery  You may drink clear liquids until NOON the morning of your surgery.   Clear liquids allowed are: Water, Non-Citrus Juices (without pulp), Carbonated Beverages, Clear Tea, Black Coffee Only, and Gatorade    Take these medicines the morning of surgery with A SIP OF WATER  levothyroxine (SYNTHROID)  gabapentin (NEURONTIN)  omeprazole (PRILOSEC)  sertraline (ZOLOFT)  IF NEEDED: acetaminophen (TYLENOL)  RESTORE PLUS LUBRICANT EYE)  diphenhydrAMINE (BENADRYL)  HYDROcodone-acetaminophen (NORCO/VICODIN)  LORazepam (ATIVAN)  methocarbamol (ROBAXIN)   As of today, STOP taking any Aspirin (unless otherwise instructed by your surgeon) Aleve, Naproxen, Ibuprofen, Motrin, Advil, Goody's, BC's, all herbal medications, fish oil, and all vitamins.                      Do not wear jewelry, make up, or nail polish            Do not wear lotions, powders, perfumes/colognes, or deodorant.            Do not shave 48 hours prior to surgery.  Men may shave face and neck.            Do not bring valuables to the hospital.            [facility] is not responsible for any belongings or valuables.  Do NOT Smoke (Tobacco/Vaping) 24 hours prior to your procedure If you use a CPAP at night, you may bring all equipment for your overnight stay.   Contacts, glasses, dentures or  bridgework may not be worn into surgery.      For patients admitted to the hospital, discharge time will be determined by your treatment team.   Patients discharged the day of surgery will not be allowed to drive home, and someone needs to stay with them for 24 hours.    Special instructions:   [place]- Preparing For Surgery  Before surgery, you can play an important role. Because skin is not sterile, your skin needs to be as free of germs as possible. You can reduce the number of germs on your skin by washing with CHG (chlorahexidine gluconate) Soap before surgery.  CHG is an antiseptic cleaner which kills germs and bonds with the skin to continue killing germs even after washing.    Oral Hygiene is also important to reduce your risk of infection.  Remember - BRUSH YOUR TEETH THE MORNING OF SURGERY WITH YOUR REGULAR TOOTHPASTE  Please do not use if you have an allergy to CHG or antibacterial soaps. If your skin becomes reddened/irritated stop using the CHG.  Do not shave (including legs and underarms) for at least 48 hours prior to first CHG shower. It is OK to shave your face.  Please follow these instructions carefully.   Shower the NIGHT BEFORE SURGERY and the MORNING OF SURGERY with DIAL Soap.  Pat yourself dry with a CLEAN TOWEL.  Wear CLEAN PAJAMAS to bed the night before surgery  Place CLEAN SHEETS on your bed the night of your first shower and DO NOT SLEEP WITH PETS.   Day of Surgery: Please shower morning of surgery  Wear Clean/Comfortable clothing the morning of surgery Do not apply any deodorants/lotions.   Remember to brush your teeth WITH YOUR REGULAR TOOTHPASTE.   Questions were answered. Patient verbalized understanding of instructions.

## 2022-11-18 ENCOUNTER — Encounter (HOSPITAL_COMMUNITY): Payer: Self-pay | Admitting: Podiatry

## 2022-11-18 NOTE — Progress Notes (Signed)
Anesthesia Chart Review: SAME DAY WORK-UP  Case: [id number] Date/Time: 11/19/22 1445   Procedures:      METATARSAL OSTEOTOMY (Left: Toe) - BLOCK     HAMMER TOE CORRECTION SECOND/THIRD (Left: Toe)     HALLUX METATARSAL PHALANGEAL JOINT FUSION (Left)     ARTHRODESIS FOOT WITH BONE GRAFT (Left: Foot)   Anesthesia type: Choice   Pre-op diagnosis:      HALLUX HAMMERTOE LEFT FOOT     PLANTAR FLEXED METATARSAL LEFT FOOT   Location: [facility] OR ROOM 08 / [place]   Surgeons: Criselda Peaches, DPM       DISCUSSION: Patient is a 77 year old female scheduled for the above procedure. She is also scheduled for implantation of a small nerve stimulator by Melida Quitter, MD on 11/29/22. S/p drug induced endoscopy on 07/28/22.   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  Last evaluation by cardiology was on 11/09/22 by Nicholes Rough, PA-C for routine follow-up and preoperative evaluations. She wrote: "Ms. Bredeson's perioperative risk of a major cardiac event is 0.9% according to the Revised Cardiac Risk Index (RCRI).  Therefore, she is at low risk for perioperative complications.   Her functional capacity is good at 6.05 METs according to the Duke Activity Status Index (DASI). Recommendations: According to ACC/AHA guidelines, no further cardiovascular testing needed.  The patient may proceed to surgery at acceptable risk.   Antiplatelet and/or Anticoagulation  Recommendations: Clopidogrel (Plavix) can be held for 5 days prior to her surgery and resumed as soon as possible post op. Guidance should come from neuro." One year follow-up recommended. She reported last dose Plavix 11/14/22.   Anesthesia team to evaluate on the day of surgery.    VS: Ht '5\' 5"'$  (1.651 m)   Wt 75.8 kg   BMI 27.79 kg/m  BP Readings from Last 3 Encounters:  11/09/22 120/60  11/03/22 121/67  07/28/22 138/68   Pulse Readings from Last 3 Encounters:  11/09/22 74  11/03/22 72  07/28/22 (!) 50    PROVIDERS: Lorene Dy, MD is PCP  Lauree Chandler, MD is primary cardiologist Sherren Mocha, MD is structural heart cardiologist Melida Quitter, MD is ENT Jacalyn Lefevre, MD is urologist Burney Gauze, MD is HEM-ONC. Last visit 06/17/21. He felt she was in remission from her relapse of autoimmune hemolytic anemia. Prednisone tapered and Xarelto discontinued. As needed follow-up.  Sarina Ill, MD and Dohmeier, Asencion Partridge, MD are neurologists   LABS: On day of surgery as indicated. Last results include: Lab Results  Component Value Date   WBC 5.7 11/16/2021   HGB 14.1 11/16/2021   HCT 41.0 11/16/2021   PLT 382 11/16/2021   GLUCOSE 119 (H) 02/24/2022   NA 139 02/24/2022   K 4.4 02/24/2022   CL 105 02/24/2022   CREATININE 0.52 (L) 02/24/2022   BUN 7 (L) 02/24/2022   CO2 19 (L) 02/24/2022   HGBA1C 5.8 (H) 02/24/2022    EKG: 11/09/2022 ([facility]): Normal sinus rhythm Left axis deviation Moderate voltage criteria for LVH, may be normal variant Possible lateral infarct, age undetermined   CV: Echo (limited; post-PFO  closure) 09/23/22: IMPRESSIONS   1. Left ventricular ejection fraction, by estimation, is 55 to 60%. The  left ventricle has normal function. Left ventricular diastolic parameters  are consistent with Grade I diastolic dysfunction (impaired relaxation).   2. Right ventricular systolic function is normal. The right ventricular  size is  normal. There is normal pulmonary artery systolic pressure.   3. S/p PFO closure with 25 mm amplatzer, well seated, no interatrial  shunt visualized . Agitated saline contrast bubble study was negative,  with no evidence of any interatrial shunt.   4. The aortic valve is tricuspid.   5. The inferior vena cava is normal in size with greater than 50%  respiratory variability, suggesting right atrial pressure of 3 mmHg.    Cardiac event monitor 07/03/21 - 08/01/21: NSR One non sustained VT 7 beat asymptomatic episode PVC burden < 1% No SVT or atrial fibrillation    TEE 07/03/21 (pre-PFO closure): IMPRESSIONS   1. Left ventricular ejection fraction, by estimation, is 55 to 60%. The  left ventricle has normal function. The left ventricle has no regional  wall motion abnormalities.   2. Right ventricular systolic function is normal. The right ventricular  size is normal.   3. Left atrial size was mildly dilated. No left atrial/left atrial  appendage thrombus was detected.   4. The mitral valve is normal in structure. Mild to moderate mitral valve  regurgitation.   5. The aortic valve is tricuspid. Aortic valve regurgitation is not  visualized.   6. There is mild (Grade II) plaque involving the descending aorta.   7. Evidence of atrial level shunting detected by color flow Doppler.  Agitated saline contrast bubble study was positive with shunting observed  within 3-6 cardiac cycles suggestive of interatrial shunt. There is a  small patent foramen ovale.    US Carotid 01/06/21: Summary:  - Right Carotid: The extracranial vessels were near-normal with only minimal wall thickening or plaque.  - Left Carotid: The extracranial vessels were near-normal with only minimal wall thickening or plaque.  - Vertebrals:  Bilateral vertebral arteries demonstrate antegrade flow.  - Subclavians: Normal flow hemodynamics were seen in bilateral subclavian arteries.     Past Medical History:   Diagnosis Date   Anxiety    Arthritis    Autoimmune hemolytic anemia ([place]) 12/2020   Depression    DVT (deep venous thrombosis) (HCC)    acute right peroneal DVT & age indeterminant left popliteal DVT 4//1922, s/p Xarelto x 6 months   Dysphagia    GERD (gastroesophageal reflux disease)    Hypothyroidism    LYMPHADENOPATHY    MIGRAINE HEADACHE    Neuromuscular disorder (HCC)    condition where muscle separates from bone left chest, quarter size, pain intermittent   PFO (patent foramen ovale)    closure 09/17/21   Sleep apnea    does not use CPAP   Stress incontinence    Stroke ([place]) 01/05/2021    Past Surgical History:  Procedure Laterality Date   BALLOON DILATION N/A 09/18/2020   Procedure: BALLOON DILATION;  Surgeon: Ronnette Juniper, MD;  Location: [facility] ENDOSCOPY;  Service: Gastroenterology;  Laterality: N/A;   BUBBLE STUDY  07/03/2021   Procedure: BUBBLE STUDY;  Surgeon: Lelon Perla, MD;  Location: [facility] ENDOSCOPY;  Service: Cardiovascular;;   CARDIAC SURGERY     DRUG INDUCED ENDOSCOPY Bilateral 07/28/2022   Procedure: DRUG INDUCED ENDOSCOPY;  Surgeon: Melida Quitter, MD;  Location: [place];  Service: ENT;  Laterality:  Bilateral;   ESOPHAGOGASTRODUODENOSCOPY (EGD) WITH PROPOFOL N/A 09/18/2020   Procedure: ESOPHAGOGASTRODUODENOSCOPY (EGD) WITH PROPOFOL;  Surgeon: Ronnette Juniper, MD;  Location: [facility] ENDOSCOPY;  Service: Gastroenterology;  Laterality: N/A;   EYE SURGERY Bilateral    Cataract L eye 1-20, R eye 3-20    FOREIGN BODY REMOVAL  09/18/2020   Procedure: FOREIGN BODY REMOVAL;  Surgeon: Ronnette Juniper, MD;  Location: [facility];  Service: Gastroenterology;;   [place] OVALE(PFO) CLOSURE N/A 08/28/2021   Procedure: PATENT FORAMEN OVALE (PFO) CLOSURE;  Surgeon: Sherren Mocha, MD;  Location: [facility] CV LAB;  Service: Cardiovascular;  Laterality: N/A;   REVERSE SHOULDER ARTHROPLASTY Right 03/16/2019   Procedure: REVERSE SHOULDER  ARTHROPLASTY;  Surgeon: Netta Cedars, MD;  Location: [facility] ORS;  Service: Orthopedics;  Laterality: Right;  interscalene block   TEE WITHOUT CARDIOVERSION N/A 07/03/2021   Procedure: TRANSESOPHAGEAL ECHOCARDIOGRAM (TEE);  Surgeon: Lelon Perla, MD;  Location: [facility] ENDOSCOPY;  Service: Cardiovascular;  Laterality: N/A;   TUBAL LIGATION      MEDICATIONS: No current facility-administered medications for this encounter.    acetaminophen (TYLENOL) 500 MG tablet   Calcium Carbonate+Vitamin D 600-200 MG-UNIT TABS   Carboxymethylcellulose Sod PF (EQ RESTORE PLUS LUBRICANT EYE) 0.5 % SOLN   clopidogrel (PLAVIX) 75 MG tablet   diphenhydrAMINE (BENADRYL) 25 MG tablet   folic acid (FOLVITE) 1 MG tablet   gabapentin (NEURONTIN) 300 MG capsule   HYDROcodone-acetaminophen (NORCO/VICODIN) 5-325 MG tablet   hydrocortisone 2.5 % cream   levothyroxine (SYNTHROID) 112 MCG tablet   LORazepam (ATIVAN) 2 MG tablet   methocarbamol (ROBAXIN) 750 MG tablet   Multiple Vitamins-Minerals (MULTIVITAMIN WITH MINERALS) tablet   mupirocin ointment (BACTROBAN) 2 %   NONFORMULARY OR COMPOUNDED ITEM   nystatin (MYCOSTATIN/NYSTOP) powder   omeprazole (PRILOSEC) 20 MG capsule   polycarbophil (FIBERCON) 625 MG tablet   sertraline (ZOLOFT) 50 MG tablet   triamcinolone cream (KENALOG) 0.1 %    Myra Gianotti, PA-C Surgical Short Stay/Anesthesiology [facility] Phone [phone] [facility] Phone [phone] 11/18/2022 11:37 AM

## 2022-11-18 NOTE — Anesthesia Preprocedure Evaluation (Addendum)
Anesthesia Evaluation  Patient identified by MRN, date of birth, ID band Patient awake    Reviewed: Allergy & Precautions, NPO status , Patient's Chart, lab work & pertinent test results  Airway Mallampati: II  TM Distance: >3 FB Neck ROM: Full    Dental no notable dental hx.    Pulmonary sleep apnea , former smoker   Pulmonary exam normal        Cardiovascular hypertension, + DVT   Rhythm:Regular Rate:Normal  Echo (limited; post-PFO closure) 09/23/22: IMPRESSIONS   1. Left ventricular ejection fraction, by estimation, is 55 to 60%. The  left ventricle has normal function. Left ventricular diastolic parameters  are consistent with Grade I diastolic dysfunction (impaired relaxation).   2. Right ventricular systolic function is normal. The right ventricular  size is normal. There is normal pulmonary artery systolic pressure.   3. S/p PFO closure with 25 mm amplatzer, well seated, no interatrial  shunt visualized . Agitated saline contrast bubble study was negative,  with no evidence of any interatrial shunt.   4. The aortic valve is tricuspid.   5. The inferior vena cava is normal in size with greater than 50%  respiratory variability, suggesting right atrial pressure of 3 mmHg.      Cardiac event monitor 07/03/21 - 08/01/21:  NSR  One non sustained VT 7 beat asymptomatic episode  PVC burden < 1%  No SVT or atrial fibrillation    Neuro/Psych  Headaches  Anxiety Depression    CVA    GI/Hepatic Neg liver ROS,GERD  Medicated,,  Endo/Other  Hypothyroidism    Renal/GU negative Renal ROS  negative genitourinary   Musculoskeletal  (+) Arthritis , Osteoarthritis,    Abdominal Normal abdominal exam  (+)   Peds  Hematology  (+) Blood dyscrasia, anemia   Anesthesia Other Findings   Reproductive/Obstetrics                             Anesthesia Physical Anesthesia Plan  ASA:  3  Anesthesia Plan: MAC and Regional   Post-op Pain Management: Regional block*   Induction: Intravenous  PONV Risk Score and Plan: 2 and Ondansetron, Dexamethasone, Propofol infusion and Treatment may vary due to age or medical condition  Airway Management Planned: Simple Face Mask, Natural Airway and Nasal Cannula  Additional Equipment: None  Intra-op Plan:   Post-operative Plan:   Informed Consent: I have reviewed the patients History and Physical, chart, labs and discussed the procedure including the risks, benefits and alternatives for the proposed anesthesia with the patient or authorized representative who has indicated his/her understanding and acceptance.     Dental advisory given  Plan Discussed with: CRNA  Anesthesia Plan Comments: (PAT note written 11/18/2022 by Myra Gianotti, PA-C.  )       Anesthesia Quick Evaluation

## 2022-11-18 NOTE — Progress Notes (Signed)
Patient was called and informed that the surgery time for tomorrow was changed to 14:10 o'clock. Patient was instructed to be at the hospital at 11:40 o'clock and stop drinking clear liquids at 11:00 o'clock. Patient verbalized understanding.

## 2022-11-19 ENCOUNTER — Other Ambulatory Visit: Payer: Self-pay

## 2022-11-19 ENCOUNTER — Ambulatory Visit (HOSPITAL_COMMUNITY): Payer: Medicare HMO | Admitting: Vascular Surgery

## 2022-11-19 ENCOUNTER — Ambulatory Visit (HOSPITAL_COMMUNITY): Payer: Medicare HMO

## 2022-11-19 ENCOUNTER — Encounter (HOSPITAL_COMMUNITY)
Admission: RE | Disposition: A | Discharge status: Home / Self Care | Payer: Self-pay | Source: Home / Self Care | Attending: Podiatry

## 2022-11-19 ENCOUNTER — Ambulatory Visit (HOSPITAL_COMMUNITY)
Admission: RE | Admit: 2022-11-19 | Discharge: 2022-11-19 | Disposition: A | Discharge status: Home / Self Care | Payer: Medicare HMO | Attending: Podiatry | Admitting: Podiatry

## 2022-11-19 ENCOUNTER — Encounter (HOSPITAL_COMMUNITY): Payer: Self-pay | Admitting: Podiatry

## 2022-11-19 DIAGNOSIS — Z85820 Personal history of malignant melanoma of skin: Secondary | ICD-10-CM | POA: Diagnosis not present

## 2022-11-19 DIAGNOSIS — Z86718 Personal history of other venous thrombosis and embolism: Secondary | ICD-10-CM | POA: Insufficient documentation

## 2022-11-19 DIAGNOSIS — M21272 Flexion deformity, left ankle and toes: Secondary | ICD-10-CM

## 2022-11-19 DIAGNOSIS — M21172 Varus deformity, not elsewhere classified, left ankle: Secondary | ICD-10-CM | POA: Diagnosis present

## 2022-11-19 DIAGNOSIS — Z8673 Personal history of transient ischemic attack (TIA), and cerebral infarction without residual deficits: Secondary | ICD-10-CM | POA: Diagnosis not present

## 2022-11-19 DIAGNOSIS — M2042 Other hammer toe(s) (acquired), left foot: Secondary | ICD-10-CM | POA: Insufficient documentation

## 2022-11-19 DIAGNOSIS — M1990 Unspecified osteoarthritis, unspecified site: Secondary | ICD-10-CM | POA: Insufficient documentation

## 2022-11-19 DIAGNOSIS — M2032 Hallux varus (acquired), left foot: Secondary | ICD-10-CM

## 2022-11-19 DIAGNOSIS — M89772 Major osseous defect, left ankle and foot: Secondary | ICD-10-CM

## 2022-11-19 DIAGNOSIS — I10 Essential (primary) hypertension: Secondary | ICD-10-CM | POA: Diagnosis not present

## 2022-11-19 DIAGNOSIS — M7742 Metatarsalgia, left foot: Secondary | ICD-10-CM | POA: Insufficient documentation

## 2022-11-19 DIAGNOSIS — E039 Hypothyroidism, unspecified: Secondary | ICD-10-CM | POA: Diagnosis not present

## 2022-11-19 DIAGNOSIS — Z8774 Personal history of (corrected) congenital malformations of heart and circulatory system: Secondary | ICD-10-CM | POA: Insufficient documentation

## 2022-11-19 DIAGNOSIS — K219 Gastro-esophageal reflux disease without esophagitis: Secondary | ICD-10-CM | POA: Diagnosis not present

## 2022-11-19 DIAGNOSIS — G4733 Obstructive sleep apnea (adult) (pediatric): Secondary | ICD-10-CM | POA: Insufficient documentation

## 2022-11-19 DIAGNOSIS — Z87891 Personal history of nicotine dependence: Secondary | ICD-10-CM

## 2022-11-19 DIAGNOSIS — G4730 Sleep apnea, unspecified: Secondary | ICD-10-CM

## 2022-11-19 DIAGNOSIS — M25872 Other specified joint disorders, left ankle and foot: Secondary | ICD-10-CM

## 2022-11-19 HISTORY — PX: HAMMER TOE SURGERY: SHX385

## 2022-11-19 HISTORY — PX: METATARSAL OSTEOTOMY: SHX1641

## 2022-11-19 HISTORY — DX: Cerebral infarction, unspecified: I63.9

## 2022-11-19 HISTORY — PX: HALLUX FUSION: SHX6621

## 2022-11-19 HISTORY — DX: Unspecified osteoarthritis, unspecified site: M19.90

## 2022-11-19 HISTORY — PX: FOOT ARTHRODESIS: SHX1655

## 2022-11-19 HISTORY — DX: Depression, unspecified: F32.A

## 2022-11-19 HISTORY — DX: Acute embolism and thrombosis of unspecified deep veins of unspecified lower extremity: I82.409

## 2022-11-19 LAB — CBC
HCT: 40 % (ref 36.0–46.0)
Hemoglobin: 14.1 g/dL (ref 12.0–15.0)
MCH: 33 pg (ref 26.0–34.0)
MCHC: 35.3 g/dL (ref 30.0–36.0)
MCV: 93.7 fL (ref 80.0–100.0)
Platelets: 399 10*3/uL (ref 150–400)
RBC: 4.27 MIL/uL (ref 3.87–5.11)
RDW: 11.7 % (ref 11.5–15.5)
WBC: 9 10*3/uL (ref 4.0–10.5)
nRBC: 0 % (ref 0.0–0.2)

## 2022-11-19 LAB — BASIC METABOLIC PANEL
Anion gap: 13 (ref 5–15)
BUN: 8 mg/dL (ref 8–23)
CO2: 22 mmol/L (ref 22–32)
Calcium: 9.5 mg/dL (ref 8.9–10.3)
Chloride: 105 mmol/L (ref 98–111)
Creatinine, Ser: 0.58 mg/dL (ref 0.44–1.00)
GFR, Estimated: >60 mL/min (ref >60)
Glucose, Bld: 95 mg/dL (ref 70–99)
Potassium: 4 mmol/L (ref 3.5–5.1)
Sodium: 140 mmol/L (ref 135–145)

## 2022-11-19 SURGERY — OSTEOTOMY, METATARSAL BONE
Anesthesia: Monitor Anesthesia Care | Site: Toe | Laterality: Left

## 2022-11-19 MED ORDER — VANCOMYCIN HCL IN DEXTROSE 1-5 GM/200ML-% IV SOLN
1000.0000 mg | INTRAVENOUS | Status: AC
  Administered 2022-11-19: 1000 mg via INTRAVENOUS
  Filled 2022-11-19: qty 200

## 2022-11-19 MED ORDER — FENTANYL CITRATE (PF) 100 MCG/2ML IJ SOLN
INTRAMUSCULAR | Status: AC
  Filled 2022-11-19: qty 2

## 2022-11-19 MED ORDER — FENTANYL CITRATE (PF) 100 MCG/2ML IJ SOLN: 50.0000 ug | Freq: Once | INTRAMUSCULAR | Status: AC

## 2022-11-19 MED ORDER — ORAL CARE MOUTH RINSE: 15.0000 mL | Freq: Once | OROMUCOSAL | Status: AC

## 2022-11-19 MED ORDER — ETOMIDATE 2 MG/ML IV SOLN
INTRAVENOUS | Status: AC
  Filled 2022-11-19: qty 10

## 2022-11-19 MED ORDER — MIDAZOLAM HCL 2 MG/2ML IJ SOLN
INTRAMUSCULAR | Status: AC
  Filled 2022-11-19: qty 2

## 2022-11-19 MED ORDER — FENTANYL CITRATE (PF) 100 MCG/2ML IJ SOLN: 25.0000 ug | INTRAMUSCULAR | Status: DC | PRN

## 2022-11-19 MED ORDER — 0.9 % SODIUM CHLORIDE (POUR BTL) OPTIME
TOPICAL | Status: DC | PRN
  Administered 2022-11-19: 1000 mL

## 2022-11-19 MED ORDER — PROPOFOL 500 MG/50ML IV EMUL
INTRAVENOUS | Status: DC | PRN
  Administered 2022-11-19: 180 ug/kg/min via INTRAVENOUS

## 2022-11-19 MED ORDER — PROPOFOL 10 MG/ML IV BOLUS
INTRAVENOUS | Status: AC
  Filled 2022-11-19: qty 20

## 2022-11-19 MED ORDER — LACTATED RINGERS IV SOLN: INTRAVENOUS | Status: DC

## 2022-11-19 MED ORDER — CHLORHEXIDINE GLUCONATE 0.12 % MT SOLN
OROMUCOSAL | Status: AC
  Administered 2022-11-19: 15 mL via OROMUCOSAL
  Filled 2022-11-19: qty 15

## 2022-11-19 MED ORDER — MIDAZOLAM HCL 2 MG/2ML IJ SOLN: 2.0000 mg | Freq: Once | INTRAMUSCULAR | Status: AC

## 2022-11-19 MED ORDER — CHLORHEXIDINE GLUCONATE 0.12 % MT SOLN: 15.0000 mL | Freq: Once | OROMUCOSAL | Status: AC

## 2022-11-19 MED ORDER — PROPOFOL 10 MG/ML IV BOLUS
INTRAVENOUS | Status: DC | PRN
  Administered 2022-11-19 (×4): 20 mg via INTRAVENOUS
  Administered 2022-11-19: 10 mg via INTRAVENOUS
  Administered 2022-11-19 (×2): 20 mg via INTRAVENOUS

## 2022-11-19 MED ORDER — ROPIVACAINE HCL 5 MG/ML IJ SOLN
INTRAMUSCULAR | Status: DC | PRN
  Administered 2022-11-19: 30 mL via PERINEURAL

## 2022-11-19 MED ORDER — ACETAMINOPHEN 10 MG/ML IV SOLN: 1000.0000 mg | Freq: Once | INTRAVENOUS | Status: DC | PRN

## 2022-11-19 MED ORDER — MIDAZOLAM HCL 2 MG/2ML IJ SOLN
INTRAMUSCULAR | Status: AC
  Administered 2022-11-19: 2 mg via INTRAVENOUS
  Filled 2022-11-19: qty 2

## 2022-11-19 MED ORDER — CLONIDINE HCL (ANALGESIA) 100 MCG/ML EP SOLN
EPIDURAL | Status: DC | PRN
  Administered 2022-11-19: 100 ug

## 2022-11-19 MED ORDER — PHENYLEPHRINE 80 MCG/ML (10ML) SYRINGE FOR IV PUSH (FOR BLOOD PRESSURE SUPPORT)
PREFILLED_SYRINGE | INTRAVENOUS | Status: AC
  Filled 2022-11-19: qty 10

## 2022-11-19 MED ORDER — FENTANYL CITRATE (PF) 100 MCG/2ML IJ SOLN
INTRAMUSCULAR | Status: AC
  Administered 2022-11-19: 50 ug via INTRAVENOUS
  Filled 2022-11-19: qty 2

## 2022-11-19 MED ORDER — HYDROCODONE-ACETAMINOPHEN 5-325 MG PO TABS: 1.0000 | ORAL_TABLET | ORAL | 0 refills | Status: AC | PRN

## 2022-11-19 MED ORDER — PROPOFOL 1000 MG/100ML IV EMUL
INTRAVENOUS | Status: AC
  Filled 2022-11-19: qty 100

## 2022-11-19 SURGICAL SUPPLY — 73 items
BAG COUNTER SPONGE SURGICOUNT (BAG) ×3 IMPLANT
BAG SPNG CNTER NS LX DISP (BAG) ×3
BIT DRILL 2.2 CANN STRGHT (BIT) IMPLANT
BIT DRILL CANN F/COMP 2.2 (BIT) IMPLANT
BIT DRILL CANN QF 1.7 (BIT) IMPLANT
BLADE AVERAGE 25X9 (BLADE) ×3 IMPLANT
BLADE OSCILLATING/SAGITTAL (BLADE) ×3
BLADE SURG 15 STRL LF DISP TIS (BLADE) ×6 IMPLANT
BLADE SURG 15 STRL SS (BLADE) ×6
BLADE SURG 15 STRL SS SAFETY (BLADE) IMPLANT
BLADE SW THK.38XMED NAR THN (BLADE) IMPLANT
BNDG CMPR 9X4 STRL LF SNTH (GAUZE/BANDAGES/DRESSINGS) ×3
BNDG CONFORM 2 STRL LF (GAUZE/BANDAGES/DRESSINGS) ×3 IMPLANT
BNDG ELASTIC 3X5.8 VLCR STR LF (GAUZE/BANDAGES/DRESSINGS) ×3 IMPLANT
BNDG ELASTIC 4X5.8 VLCR STR LF (GAUZE/BANDAGES/DRESSINGS) IMPLANT
BNDG ESMARK 4X9 LF (GAUZE/BANDAGES/DRESSINGS) ×3 IMPLANT
BNDG GAUZE DERMACEA FLUFF 4 (GAUZE/BANDAGES/DRESSINGS) ×3 IMPLANT
BNDG GZE DERMACEA 4 6PLY (GAUZE/BANDAGES/DRESSINGS) ×3
CAP PIN ORTHO PINK (CAP) IMPLANT
COVER BACK TABLE 60X90IN (DRAPES) ×3 IMPLANT
CUFF TOURN SGL QUICK 18X4 (TOURNIQUET CUFF) IMPLANT
DRAPE HALF SHEET 40X57 (DRAPES) ×3 IMPLANT
DRAPE OEC MINIVIEW 54X84 (DRAPES) IMPLANT
DRSG EMULSION OIL 3X3 NADH (GAUZE/BANDAGES/DRESSINGS) ×3 IMPLANT
DURAPREP 26ML APPLICATOR (WOUND CARE) ×3 IMPLANT
ELECT REM PT RETURN 9FT ADLT (ELECTROSURGICAL) ×3
ELECTRODE REM PT RTRN 9FT ADLT (ELECTROSURGICAL) ×3 IMPLANT
GAUZE 4X4 16PLY ~~LOC~~+RFID DBL (SPONGE) IMPLANT
GAUZE SPONGE 4X4 12PLY STRL (GAUZE/BANDAGES/DRESSINGS) ×3 IMPLANT
GAUZE XEROFORM 1X8 LF (GAUZE/BANDAGES/DRESSINGS) IMPLANT
GLOVE BIO SURGEON STRL SZ7.5 (GLOVE) ×6 IMPLANT
GOWN STRL REUS W/ TWL LRG LVL3 (GOWN DISPOSABLE) ×3 IMPLANT
GOWN STRL REUS W/ TWL XL LVL3 (GOWN DISPOSABLE) ×3 IMPLANT
GOWN STRL REUS W/TWL LRG LVL3 (GOWN DISPOSABLE) ×3
GOWN STRL REUS W/TWL XL LVL3 (GOWN DISPOSABLE) ×3
GUIDEWIRE .045XTROC TIP LSR LN (WIRE) IMPLANT
GUIDEWIRE 1.6 (WIRE) ×3
GUIDEWIRE ORTH 157X1.6XTROC (WIRE) IMPLANT
HARVESTER BONE GRAFT 6 (INSTRUMENTS) IMPLANT
K-WIRE 1.1 (WIRE) ×3
K-WIRE SURGICAL 1.6X102 (WIRE) IMPLANT
KIT BASIN OR (CUSTOM PROCEDURE TRAY) ×3 IMPLANT
KWIRE W/LASER LINE SS QF .86 (WIRE) IMPLANT
NDL 18GX1X1/2 (RX/OR ONLY) (NEEDLE) IMPLANT
NDL HYPO 25X1 1.5 SAFETY (NEEDLE) ×3 IMPLANT
NEEDLE 18GX1X1/2 (RX/OR ONLY) (NEEDLE) IMPLANT
NEEDLE HYPO 25X1 1.5 SAFETY (NEEDLE) ×3 IMPLANT
NS IRRIG 1000ML POUR BTL (IV SOLUTION) IMPLANT
PADDING CAST ABS COTTON 4X4 ST (CAST SUPPLIES) ×3 IMPLANT
PENCIL BUTTON HOLSTER BLD 10FT (ELECTRODE) ×3 IMPLANT
PIN BB TAK MTP SU (PIN) IMPLANT
PIN BB TAK MTP THRD SU (PIN) IMPLANT
PLATE COMPR MTP LT 5 (Plate) IMPLANT
REAMER METATARSAL CUP 20MM (MISCELLANEOUS) IMPLANT
REAMER PHALANGEAL CONE 20MM (MISCELLANEOUS) IMPLANT
SCREW CANN HDLS QF 3.5X22 (Screw) IMPLANT
SCREW CORTICAL 3.0X18 (Screw) IMPLANT
SCREW NLOCK QF ST 2.4X12 (Screw) IMPLANT
SCREW VAL KREULOCK 3.0X12 TI (Screw) IMPLANT
SCREW VAL KREULOCK 3.0X18 TI (Screw) IMPLANT
SCREW VAL KREULOCK 3X22 (Screw) IMPLANT
STOCKINETTE 6  STRL (DRAPES) ×3
STOCKINETTE 6 STRL (DRAPES) ×3 IMPLANT
SUT ETHILON 4 0 PS 2 18 (SUTURE) IMPLANT
SUT MERSILENE 4-0 S-2 (SUTURE) ×3 IMPLANT
SUT MNCRL AB 3-0 PS2 18 (SUTURE) ×3 IMPLANT
SUT MNCRL AB 4-0 PS2 18 (SUTURE) IMPLANT
SUT MON AB 5-0 PS2 18 (SUTURE) ×3 IMPLANT
SUT VIC AB 3-0 SH 27 (SUTURE) ×3
SUT VIC AB 3-0 SH 27X BRD (SUTURE) IMPLANT
SYR 10ML LL (SYRINGE) IMPLANT
SYR BULB EAR ULCER 3OZ GRN STR (SYRINGE) ×3 IMPLANT
UNDERPAD 30X36 HEAVY ABSORB (UNDERPADS AND DIAPERS) ×3 IMPLANT

## 2022-11-19 NOTE — Anesthesia Procedure Notes (Signed)
Anesthesia Regional Block: Popliteal block   Pre-Anesthetic Checklist: , timeout performed,  Correct Patient, Correct Site, Correct Laterality,  Correct Procedure, Correct Position, site marked,  Risks and benefits discussed,  Surgical consent,  Pre-op evaluation,  At surgeon's request and post-op pain management  Laterality: Left  Prep: Dura Prep       Needles:  Injection technique: Single-shot  Needle Type: Echogenic Stimulator Needle     Needle Length: 10cm  Needle Gauge: 20     Additional Needles:   Procedures:,,,, ultrasound used (permanent image in chart),,    Narrative:  Start time: 11/19/2022 2:37 PM End time: 11/19/2022 2:40 PM Injection made incrementally with aspirations every 5 mL.  Performed by: Personally  Anesthesiologist: Darral Dash, DO  Additional Notes: Patient identified. Risks/Benefits/Options discussed with patient including but not limited to bleeding, infection, nerve damage, failed block, incomplete pain control. Patient expressed understanding and wished to proceed. All questions were answered. Sterile technique was used throughout the entire procedure. Please see nursing notes for vital signs. Aspirated in 5cc intervals with injection for negative confirmation. Patient was given instructions on fall risk and not to get out of bed. All questions and concerns addressed with instructions to call with any issues or inadequate analgesia.

## 2022-11-19 NOTE — Anesthesia Procedure Notes (Signed)
Procedure Name: MAC Date/Time: 11/19/2022 3:19 PM  Performed by: Moshe Salisbury, CRNAPre-anesthesia Checklist: Patient identified, Emergency Drugs available, Suction available and Patient being monitored Patient Re-evaluated:Patient Re-evaluated prior to induction Oxygen Delivery Method: Simple face mask Placement Confirmation: positive ETCO2 Dental Injury: Teeth and Oropharynx as per pre-operative assessment

## 2022-11-19 NOTE — Brief Op Note (Signed)
11/19/2022  6:14 PM  PATIENT:  [name]  77 y.o. female  PRE-OPERATIVE DIAGNOSIS:  HALLUX HAMMERTOE LEFT FOOT PLANTAR FLEXED METATARSAL LEFT FOOT  POST-OPERATIVE DIAGNOSIS:  HALLUX HAMMERTOE LEFT FOOTPLANTAR FLEXED METATARSAL LEFT FOOT  PROCEDURE:  Procedure(s) with comments: METATARSAL OSTEOTOMY (Left) - BLOCK HAMMER TOE CORRECTION SECOND/THIRD (Left) HALLUX METATARSAL PHALANGEAL JOINT FUSION (Left) ARTHRODESIS FOOT WITH BONE GRAFT (Left)  SURGEON:  Surgeon(s) and Role:    * Alexanderia Gorby, Stephan Minister, DPM - Primary  PHYSICIAN ASSISTANT:   ASSISTANTS: none   ANESTHESIA:   regional  EBL:  30cc   BLOOD ADMINISTERED:none  DRAINS: none   LOCAL MEDICATIONS USED:  NONE  SPECIMEN:  No Specimen  DISPOSITION OF SPECIMEN:  N/A  COUNTS:  YES  TOURNIQUET:   Total Tourniquet Time Documented: Calf (Left) - 120 minutes Total: Calf (Left) - 120 minutes   DICTATION: .Note written in EPIC  PLAN OF CARE: Discharge to home after PACU  PATIENT DISPOSITION:  PACU - hemodynamically stable.   Delay start of Pharmacological VTE agent (>24hrs) due to surgical blood loss or risk of bleeding: yes

## 2022-11-19 NOTE — Progress Notes (Signed)
Orthopedic Tech Progress Note Patient Details:  [name] 11/04/1945 [id number]  Ortho Devices Type of Ortho Device: CAM walker Ortho Device/Splint Location: LLE Ortho Device/Splint Interventions: Ordered, Application, Adjustment   Post Interventions Patient Tolerated: Well Instructions Provided: Care of device, Adjustment of device  [place] [facility] 11/19/2022, 6:49 PM

## 2022-11-19 NOTE — Transfer of Care (Signed)
Immediate Anesthesia Transfer of Care Note  Patient: [name]  Procedure(s) Performed: METATARSAL OSTEOTOMY (Left: Toe) HAMMER TOE CORRECTION SECOND/THIRD (Left: Toe) HALLUX METATARSAL PHALANGEAL JOINT FUSION (Left) ARTHRODESIS FOOT WITH BONE GRAFT (Left: Foot)  Patient Location: PACU  Anesthesia Type:MAC combined with regional for post-op pain  Level of Consciousness: drowsy and patient cooperative  Airway & Oxygen Therapy: Patient Spontanous Breathing  Post-op Assessment: Report given to RN, Post -op Vital signs reviewed and stable, and Patient moving all extremities X 4  Post vital signs: Reviewed and stable  Last Vitals:  Vitals Value Taken Time  BP 117/77 11/19/22 1819  Temp    Pulse 62 11/19/22 1821  Resp 18 11/19/22 1821  SpO2 94 % 11/19/22 1821  Vitals shown include unvalidated device data.  Last Pain:  Vitals:   11/19/22 1445  TempSrc:   PainSc: 0-No pain         Complications: No notable events documented.

## 2022-11-19 NOTE — Discharge Instructions (Addendum)
Post-Surgery Instructions  1. If you are recuperating from surgery anywhere other than home, please be sure to leave [place] a number where you can be reached. 2. Go directly home and rest. 3. The keep operated foot (or feet) elevated six inches above the hip when sitting or lying down. 4. Support the elevated foot and leg with pillows under the calf. DO NOT PLACE PILLOWS UNDER THE KNEE. 5. DO NOT REMOVE or get your bandages wet. This will increase your chances of getting an infection. 6. Wear your surgical boot at all times when you are up. When resting awake you may remove it but be careful removing it as this can bump or dislodge the pins. If the boot is not causing pain, leave it on  7. A limited amount of pain and swelling may occur. The skin may take on a bruised appearance. This is no cause for alarm. 8. Apply an ice pack directly over the bandage (undo boot straps) and also behind the knee for 15 minutes every hour. Continue icing until seen in the office. DO NOT apply any form of heat to the area. 9. Have prescription(s) filled immediately and take as directed. 10. Drink lots of liquids, water, and juice. 11. CALL THE OFFICE IMMEDIATELY IF: a. Bleeding continues b. Pain increases and/or does not respond to medication c. Bandage or cast appears too tight d. Any liquids (water, coffee, etc.) have spilled on your bandages. e. Tripping, falling, or stubbing the surgical foot f. If your temperature rises above 101 g. If you have ANY questions at all 12. Please use the crutches, knee scooter, or walker you have prescribed, rented, or purchased. If you are weight-bearing, follow your physician's instructions. You are expected to be: can weight bear in boot with body weight to the heel 13. Special Instructions:  _____________________________________________________________ _________________________________________________________________________________ _________________________________________________________________________________  14. Your next appointment is: 11/25/2022 11:45 AM   If you need to reach the nurse for any reason, please call: [place]: [phone] [place]: [phone] [place]: [phone]

## 2022-11-19 NOTE — H&P (Signed)
History and Physical Interval Note:  11/19/2022 2:01 PM  [name]  has presented today for surgery, with the diagnosis of bunion and hammertoe of left foot.  The various methods of treatment have been discussed with the patient and family. After consideration of risks, benefits and other options for treatment, the patient has consented to   Procedure(s) with comments: METATARSAL OSTEOTOMY (Left) - BLOCK HAMMER TOE CORRECTION SECOND/THIRD (Left) HALLUX METATARSAL PHALANGEAL JOINT FUSION (Left) ARTHRODESIS FOOT WITH BONE GRAFT (Left) as a surgical intervention.  The patient's history has been reviewed, patient examined, no change in status, stable for surgery.  I have reviewed the patient's chart and labs.  Questions were answered to the patient's satisfaction.     Criselda Peaches

## 2022-11-20 NOTE — Anesthesia Postprocedure Evaluation (Signed)
Anesthesia Post Note  Patient: [name]  Procedure(s) Performed: METATARSAL OSTEOTOMY (Left: Toe) HAMMER TOE CORRECTION SECOND/THIRD (Left: Toe) HALLUX METATARSAL PHALANGEAL JOINT FUSION (Left) ARTHRODESIS FOOT WITH BONE GRAFT (Left: Foot)     Patient location during evaluation: PACU Anesthesia Type: Regional and MAC Level of consciousness: awake and alert Pain management: pain level controlled Vital Signs Assessment: post-procedure vital signs reviewed and stable Respiratory status: spontaneous breathing, nonlabored ventilation, respiratory function stable and patient connected to nasal cannula oxygen Cardiovascular status: stable and blood pressure returned to baseline Postop Assessment: no apparent nausea or vomiting Anesthetic complications: no   No notable events documented.  Last Vitals:  Vitals:   11/19/22 1830 11/19/22 1845  BP: (!) 117/93 128/65  Pulse: (!) 56 61  Resp: 17 (!) 23  Temp:  36.6 C  SpO2: 99% 98%    Last Pain:  Vitals:   11/19/22 1845  TempSrc:   PainSc: 0-No pain                 Belenda Cruise P Kasiya Burck

## 2022-11-22 ENCOUNTER — Encounter (HOSPITAL_COMMUNITY): Payer: Self-pay | Admitting: Podiatry

## 2022-11-22 DIAGNOSIS — M2032 Hallux varus (acquired), left foot: Secondary | ICD-10-CM

## 2022-11-22 DIAGNOSIS — M89772 Major osseous defect, left ankle and foot: Secondary | ICD-10-CM

## 2022-11-22 DIAGNOSIS — M25872 Other specified joint disorders, left ankle and foot: Secondary | ICD-10-CM

## 2022-11-22 DIAGNOSIS — M2042 Other hammer toe(s) (acquired), left foot: Secondary | ICD-10-CM

## 2022-11-22 DIAGNOSIS — M7742 Metatarsalgia, left foot: Secondary | ICD-10-CM

## 2022-11-22 NOTE — Op Note (Signed)
Patient Name: [name] DOB: 09-05-1946  MRN: [id number]   Date of Service: 11/19/2022  Surgeon: Dr. 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 Item Serial No. Manufacturer Lot No. LRB No. Used Action  SCREW VAL KREULOCK 3.0X18 TI - [id number] Screw SCREW VAL KREULOCK 3.0X18 TI  [employer]  Left 2 Implanted  SCREW VAL KREULOCK 3.0X12 TI - [id number] Screw SCREW VAL KREULOCK 3.0X12 TI  [employer]  Left 1 Implanted  PLATE COMPR MTP LT 5 - [id number] Plate PLATE COMPR MTP LT 5  [employer]  Left 1 Implanted  SCREW VAL KREULOCK 3X22 - [id number] Screw SCREW VAL KREULOCK 3X22  [employer]  Left 1 Implanted  SCREW CORTICAL 3.0X18 - [id number] Screw SCREW CORTICAL 3.0X18  [employer]  Left 1 Implanted  3.5 x 22 Comp Screw      Left 1 Implanted  SCREW NLOCK QF ST 2.4X12 - [id number] Screw SCREW NLOCK QF ST 2.4X12  [employer]  Left 2 Implanted   Medications: None given Complications: No complication noted  Indications for Procedure:  This is a 77 y.o. female with a history of previous bunionectomy including fibular sesamoidectomy and she developed hallux varus and hallux valgus deformity.  She had a primary complaint of pain under the first second  and third metatarsal as well as in the interphalangeal joint and lesser digital deformities.  She presents today for surgical repair following failure of nonoperative treatment   Procedure in Detail: Patient was identified in pre-operative holding area. Formal consent was signed and the left lower extremity was marked. Patient was brought back to the operating room. Anesthesia was induced. The extremity was prepped and draped in the usual sterile fashion. Timeout was taken to confirm patient name, laterality, and procedure prior to incision.   Attention was then directed to the left foot where a longitudinal incision was made just medial to the extensor hallucis longus tendon.  This carried through subcutaneous tissues all bleeders were cauterized as necessary.  Neurovascular bundles and extensor tendon were safely retracted and dissection was carried down to the capsule of the first metatarsal phalangeal joint which was incised and a McGlamry elevator was used to expose the metatarsal head and base of the proximal phalanx.  Soft tissue attachments were freed from the base of the phalanx.  Osteoarthritis of both surfaces was noted.  A guidewire for the reamer was placed over the metatarsal head and the reamer was used to resect the articular cartilage and some of the subchondral bone plate.  The same was done with the cone reamer using a guidewire on the base of the proximal phalanx.  The prominent medial eminence of the first metatarsal head was then resected using a sagittal saw.  All bony and soft tissue debris was then thoroughly irrigated from the wound.  Following temporary positioning of the hallux there is still plantar prominence and I determined that this is likely from her remaining tibial sesamoid.  I then carried my dissection plantar and medial to expose the medial capsule.  The sesamoidal suspensory ligament was separated from the medial capsule and dissection was carried plantar in order to  expose the sesamoid.  It was separated from the long flexor tendon and scar tissue from previous sesamoidectomy of the fibular sesamoid.  The tibial sesamoid was excised.  This reduced the plantar prominence and allowed for easier positioning of the hallux.  A 2.5 mm drill bit was used to fenestrate the head of the first metatarsal and base of the proximal phalanx.  I then made an incision of the lateral heel and blunt dissection was used to expose the lateral wall the calcaneus.  A bone graft reamer was used to obtain 1 to 2 cc of autogenous cancellous bone graft.  This was placed into the first metatarsal phalangeal joint arthrodesis site.  The hallux was then placed into a corrected position using fluoroscopic guidance.  Once satisfactory position had been achieved in the transverse plane it was slightly dorsiflexed using a plate to simulate weightbearing and a folded 4 x 4 sponge.  A guidewire for the 3.5 millimeter screw was then placed.  I then directed my attention dorsally and selected a plate and put it in temporary position using plate tacks.  Due to her unique deformity to position the hallux to address for both the interphalangeal and metatarsophalangeal deformity in both planes, I had to manually bend the plate to suit her anatomy.  Once it was satisfactory in the correct position, locking screws were placed in the distal holes with good stability of the construct.  I then utilized a compression device to gain compression across the metatarsal phalangeal joint, the non locking screw was used in the compression slot to achieve further compression.  It was then completed with locking screws. The 3.5 mm FT screw was then placed with good compression and stability across the fusion site.   Attention was then directed to the second and third digits where a semielliptical transverse incision was made from the base of the proximal phalanx to the distal middle phalanx.  This was carried deep to expose the  extensor digitorum longus tendon and a transverse tenotomy was made.  The PIPJ was exposed and the collateral ligaments were released.  The articular surface of the head of the proximal phalanx and the base of the middle phalanx was then resected using a sagittal saw.  A 0.062 inch Kirschner wire was then inserted through the base of the middle phalanx through the distal phalanx and out the tip of the toe just under the nail.  This was then driven retrograde to the level of the base of the proximal phalanx.  Good compression of the osteotomy site was noted clinically and fluoroscopically with adequate reduction of the deformity.  She had residual metatarsal phalangeal joint deformity and elongation of the metatarsals.  I then made a separate incision in the second interspace and blunt and sharp dissection was used to expose both the second and third metatarsophalangeal joints.  A tenotomy was made of the extensor digitorum brevis tendon and the longus tendon was retracted safely.  A dorsal capsulotomy was made and the metatarsal head was exposed.  A sagittal saw was used to create a Weil osteotomy and the capital fragment was translated proximally.  It was temporarily fixated and  then final fixation was completed with a 2.4 millimeter screw.  Separate but identical procedures were performed on the second and third metatarsals.  The 0.062" Kirschner wire was then driven into the capital fragment as well.   Final films were taken and the incision was thoroughly irrigated, closure was initiated in layers using 3-0 Monocryl and 4-0 nylon. The foot was then dressed with Xeroform and dry sterile dressings with an Ace wrap under light compression. Patient tolerated the procedure well.   Disposition: Following a period of post-operative monitoring, patient will be transferred to home.

## 2022-11-25 ENCOUNTER — Ambulatory Visit (INDEPENDENT_AMBULATORY_CARE_PROVIDER_SITE_OTHER): Payer: Medicare HMO | Admitting: Podiatry

## 2022-11-25 ENCOUNTER — Ambulatory Visit (INDEPENDENT_AMBULATORY_CARE_PROVIDER_SITE_OTHER): Payer: Medicare HMO

## 2022-11-25 DIAGNOSIS — M2032 Hallux varus (acquired), left foot: Secondary | ICD-10-CM

## 2022-11-25 DIAGNOSIS — M21962 Unspecified acquired deformity of left lower leg: Secondary | ICD-10-CM

## 2022-11-25 DIAGNOSIS — M2042 Other hammer toe(s) (acquired), left foot: Secondary | ICD-10-CM

## 2022-11-25 MED ORDER — DOXYCYCLINE HYCLATE 100 MG PO TABS: 100.0000 mg | ORAL_TABLET | Freq: Two times a day (BID) | ORAL | 0 refills | Status: DC

## 2022-11-29 ENCOUNTER — Encounter: Payer: Self-pay | Admitting: Podiatry

## 2022-11-29 ENCOUNTER — Ambulatory Visit: Admit: 2022-11-29 | Payer: Medicare HMO | Admitting: Otolaryngology

## 2022-11-29 SURGERY — INSERTION, HYPOGLOSSAL NERVE STIMULATOR
Anesthesia: General | Laterality: Bilateral

## 2022-11-29 NOTE — Progress Notes (Signed)
  Subjective:  Patient ID: [name], female    DOB: 1946/03/24,  MRN: [id number]  Chief Complaint  Patient presents with   Routine Post Op    POV #1 DOS 11/19/2022 LT GREAT TOE JOINT FUSION W/TENDON TRASFER & 2ND/3RD HAMMERTOE CORRECTION, POSS FUSION OF BOTH JOINTS, SHORTENING 2ND METATARSAL     77 y.o. female returns for post-op check.  Overall doing well having some pain but fairly well-controlled  Review of Systems: Negative except as noted in the HPI. Denies N/V/F/Ch.   Objective:  There were no vitals filed for this visit. There is no height or weight on file to calculate BMI. Constitutional Well developed. Well nourished.  Vascular Foot warm and well perfused. Capillary refill normal to all digits.  Calf is soft and supple, no posterior calf or knee pain, negative Homans' sign  Neurologic Normal speech. Oriented to person, place, and time. Epicritic sensation to light touch grossly present bilaterally.  Dermatologic Skin healing well, incision well coapted, there is some erythema on the dorsal forefoot  Orthopedic: Tenderness to palpation noted about the surgical site.   Multiple view plain film radiographs: Good correction noted, no complication of hardware Assessment:   1. Hallux hammertoe, left    Plan:  Patient was evaluated and treated and all questions answered.  S/p foot surgery left -Progressing as expected post-operatively. -XR: Noted above no complication -WB Status: Weightbearing to heel in CAM boot -Sutures: Return in 2 weeks to remove. -Medications: Rx for doxycycline sent to pharmacy, she had some superficial erythema today which I suspect is reactive -Foot redressed.  No follow-ups on file.

## 2022-12-03 ENCOUNTER — Telehealth (INDEPENDENT_AMBULATORY_CARE_PROVIDER_SITE_OTHER): Payer: Medicare HMO | Admitting: Adult Health

## 2022-12-03 DIAGNOSIS — G629 Polyneuropathy, unspecified: Secondary | ICD-10-CM | POA: Diagnosis not present

## 2022-12-03 DIAGNOSIS — G609 Hereditary and idiopathic neuropathy, unspecified: Secondary | ICD-10-CM

## 2022-12-03 NOTE — Progress Notes (Signed)
PATIENT: [name] DOB: [birth year]  REASON FOR VISIT: follow up HISTORY FROM: patient  Virtual Visit via Video Note  I connected with [name] on 12/03/22 at  9:15 AM EDT by a video enabled telemedicine application located remotely at [facility] and verified that I am speaking with the correct person using two identifiers who was located at their own home.   I discussed the limitations of evaluation and management by telemedicine and the availability of in person appointments. The patient expressed understanding and agreed to proceed.   PATIENT: [name] DOB: 01/11/1946  REASON FOR VISIT: follow up HISTORY FROM: patient  HISTORY OF PRESENT ILLNESS: Today 12/03/22:  [name] is a 77 y.o. female with a history of neuropathy. Returns today for follow-up.  She reports that she has spaced out her gabapentin instead of taking 2 tablets 4 times a day she takes 1 tablet every 2 hours for total of 8 times a day.  The total amount of gabapentin has remained the same she is just taking it more frequently.  She reports that this is working better for her symptoms.  Reports no recent falls.  She did have surgery on her foot.  Returns today for an evaluation.  11/03/22:   [name] is a 77 y.o. female with a history of neuropathy. Returns today for follow-up. Reports burning pain is in the feet. But will have sharp pain that comes up the inside of the right leg on the inner thigh and the outer thigh of the left leg. No significant changes with gait or balance. No falls. Continues on gabapentin 2 tablets four times a day. Sometimes will not take full dose if she is not having pain.      Will be having surgery on left foot in march d/t hammer toes.  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  ALLERGIES: Allergies  Allergen Reactions   Oxycodone Other (See Comments)    Hallucinations    Aspirin Other (See Comments)    Interferes with headaches  Other Reaction(s): stomach issues   Imipenem Other (See Comments)    Warm and tingly all over   Methocarbamol Rash   Statins Other (See Comments)    leg pain, muscle cramps   Losartan Other (See Comments)    Pt unsure   Cephalexin Other (See Comments)    Stomach cramps   Clindamycin/Lincomycin Nausea And Vomiting and Rash   Contrast Media [Iodinated Contrast Media] Rash   Losartan Potassium-Hctz Other (See Comments)    Unknown Reaction - pt is unaware of this  Other Reaction(s): do not remember   Methylprednisolone Rash    Rash on face   Penicillins Rash    Did it involve swelling of the face/tongue/throat, SOB, or low BP? No Did it involve sudden or severe rash/hives, skin peeling, or any reaction on the inside of your mouth or nose? Yes Did you need to seek medical attention at a hospital or doctor's office? No When did it last happen?      2011 If all above answers are "NO", may proceed with cephalosporin use.    Quinolones Rash    HOME MEDICATIONS: Outpatient Medications Prior to Visit  Medication Sig Dispense Refill   acetaminophen (TYLENOL) 500 MG tablet Take 1,000 mg by  mouth every 4 (four) hours as needed for headache.     Calcium Carbonate+Vitamin D 600-200 MG-UNIT TABS Take 1 tablet by mouth 2 (two) times daily.     Carboxymethylcellulose Sod PF (EQ RESTORE PLUS LUBRICANT EYE) 0.5 % SOLN Place 1 drop into both eyes daily as needed (Dry eye).     clopidogrel (PLAVIX) 75 MG tablet Take 1 tablet (75 mg total) by mouth daily. Start 5 days before PFO closure procedure 90 tablet 1   diphenhydrAMINE (BENADRYL) 25 MG tablet Take 25-50 mg by mouth daily as needed for allergies.     doxycycline (VIBRA-TABS) 100 MG tablet Take 1 tablet (100 mg total) by mouth 2 (two) times daily. 20 tablet 0   folic acid  (FOLVITE) 1 MG tablet Take 2 tablets (2 mg total) by mouth daily. (Patient taking differently: Take 1 mg by mouth daily.)     gabapentin (NEURONTIN) 300 MG capsule TAKE 1 TO 2 CAPSULES FOUR TIMES DAILY AS NEEDED (Patient taking differently: Take 300 mg by mouth See admin instructions. Take 300 mg eight times a day) 720 capsule 3   HYDROcodone-acetaminophen (NORCO/VICODIN) 5-325 MG tablet Take 1 tablet by mouth See admin instructions. 2 tablets per week as needed for migraine     hydrocortisone 2.5 % cream Apply 1 Application topically in the morning. On face     levothyroxine (SYNTHROID) 112 MCG tablet Take 112 mcg by mouth daily before breakfast.     LORazepam (ATIVAN) 2 MG tablet Take 2 mg by mouth 3 (three) times daily as needed for anxiety.     methocarbamol (ROBAXIN) 750 MG tablet Take 750 mg by mouth 2 (two) times daily as needed for muscle spasms.     Multiple Vitamins-Minerals (MULTIVITAMIN WITH MINERALS) tablet Take 1 tablet by mouth daily. Complete     mupirocin ointment (BACTROBAN) 2 % Apply 1 Application topically every evening.     NONFORMULARY OR COMPOUNDED ITEM Place 1-2 g onto the skin daily as needed (neuropathy). Baclofen 3%, Clonidine 0.2%, Diclofenac 4%, Gabapentin 6%, Lidocaine 3%.    Apply 1 to 2 g to the affected area 3-4 times daily.  Dispense 240 g with 5 refills.     nystatin (MYCOSTATIN/NYSTOP) powder Apply 1 application topically 3 (three) times daily as needed (irritation).     omeprazole (PRILOSEC) 20 MG capsule Take 20 mg by mouth daily.     polycarbophil (FIBERCON) 625 MG tablet Take 625 mg by mouth in the morning and at bedtime.     sertraline (ZOLOFT) 50 MG tablet Take 50 mg by mouth daily.     triamcinolone cream (KENALOG) 0.1 % 1 application daily as needed (Vaginal irritations).     No facility-administered medications prior to visit.    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   Depression    DVT (deep venous thrombosis) (HCC)    acute right peroneal DVT & age indeterminant left popliteal DVT 4//1922, s/p Xarelto x 6 months   Dysphagia    GERD (gastroesophageal reflux disease)    Hypothyroidism    LYMPHADENOPATHY    MIGRAINE HEADACHE    Neuromuscular disorder (HCC)    condition where muscle separates from bone left chest, quarter size, pain intermittent   PFO (patent foramen ovale)    closure 09/17/21   Sleep apnea    does not use CPAP   Stress incontinence    Stroke (  [place]) 01/05/2021    PAST SURGICAL HISTORY: Past Surgical History:  Procedure Laterality Date   BALLOON DILATION N/A 09/18/2020   Procedure: BALLOON DILATION;  Surgeon: Ronnette Juniper, MD;  Location: [facility];  Service: Gastroenterology;  Laterality: N/A;   BUBBLE STUDY  07/03/2021   Procedure: BUBBLE STUDY;  Surgeon: Lelon Perla, MD;  Location: [place];  Service: Cardiovascular;;   CARDIAC SURGERY     DRUG INDUCED ENDOSCOPY Bilateral 07/28/2022   Procedure: DRUG INDUCED ENDOSCOPY;  Surgeon: Melida Quitter, MD;  Location: [place];  Service: ENT;  Laterality: Bilateral;   ESOPHAGOGASTRODUODENOSCOPY (EGD) WITH PROPOFOL N/A 09/18/2020   Procedure: ESOPHAGOGASTRODUODENOSCOPY (EGD) WITH PROPOFOL;  Surgeon: Ronnette Juniper, MD;  Location: [facility];  Service: Gastroenterology;  Laterality: N/A;   EYE SURGERY Bilateral    Cataract L eye 1-20, R eye 3-20    FOOT ARTHRODESIS Left 11/19/2022   Procedure: ARTHRODESIS FOOT WITH BONE GRAFT;  Surgeon: Criselda Peaches, DPM;  Location: [place];  Service: Podiatry;  Laterality: Left;   FOREIGN BODY REMOVAL  09/18/2020   Procedure: FOREIGN BODY REMOVAL;  Surgeon: Ronnette Juniper, MD;  Location: [facility];  Service: Gastroenterology;;   Milus Glazier FUSION Left 11/19/2022   Procedure: HALLUX METATARSAL PHALANGEAL JOINT FUSION;  Surgeon: Criselda Peaches, DPM;  Location: [place];  Service: Podiatry;  Laterality: Left;   HAMMER TOE  SURGERY Left 11/19/2022   Procedure: HAMMER TOE CORRECTION SECOND/THIRD;  Surgeon: Criselda Peaches, DPM;  Location: [place];  Service: Podiatry;  Laterality: Left;   METATARSAL OSTEOTOMY Left 11/19/2022   Procedure: METATARSAL OSTEOTOMY;  Surgeon: Criselda Peaches, DPM;  Location: [place];  Service: Podiatry;  Laterality: Left;  BLOCK   MOHS SURGERY  2001   PATENT FORAMEN OVALE(PFO) CLOSURE N/A 08/28/2021   Procedure: PATENT FORAMEN OVALE (PFO) CLOSURE;  Surgeon: Sherren Mocha, MD;  Location: [place] [facility];  Service: Cardiovascular;  Laterality: N/A;   REVERSE SHOULDER ARTHROPLASTY Right 03/16/2019   Procedure: REVERSE SHOULDER ARTHROPLASTY;  Surgeon: Netta Cedars, MD;  Location: [facility];  Service: Orthopedics;  Laterality: Right;  interscalene block   TEE WITHOUT CARDIOVERSION N/A 07/03/2021   Procedure: TRANSESOPHAGEAL ECHOCARDIOGRAM (TEE);  Surgeon: Lelon Perla, MD;  Location: [facility];  Service: Cardiovascular;  Laterality: N/A;   TUBAL LIGATION      FAMILY HISTORY: Family History  Problem Relation Age of Onset   Heart disease Mother    Stroke Father    Arthritis Sister    Diabetes Brother    Arthritis Brother    Neuropathy Brother    Neuropathy Sister    Diabetes Brother     SOCIAL HISTORY: Social History   Socioeconomic History   Marital status: Married    Spouse name: Not on file   Number of children: Not on file   Years of education: Not on file   Highest education level: Not on file  Occupational History   Not on file  Tobacco Use   Smoking status: Former    Types: Cigarettes    Quit date: 1969    Years since quitting: 55.2   Smokeless tobacco: Never  Vaping Use   Vaping Use: Never used  Substance and Sexual Activity   Alcohol use: No   Drug use: No   Sexual activity: Not Currently    Birth control/protection: Post-menopausal  Other Topics Concern   Not on file  Social History Narrative   Lives at home with husband (has mild Dementia) of 84  years   Right handed    Caffeine: 1 cup tea/day   Social Determinants of Health   Financial Resource Strain: Not on file  Food Insecurity: Not on file  Transportation Needs: Not on file  Physical Activity: Not on file  Stress: Not on file  Social Connections: Not on file  Intimate Partner Violence: Not on file      PHYSICAL EXAM Generalized: Well developed, in no acute distress   Neurological examination  Mentation: Alert oriented to time, place, history taking. Follows all commands speech and language fluent Cranial nerve II-XII: Facial symmetry noted  DIAGNOSTIC DATA (LABS, IMAGING, TESTING) - I reviewed patient records, labs, notes, testing and imaging myself where available.  Lab Results  Component Value Date   WBC 9.0 11/19/2022   HGB 14.1 11/19/2022   HCT 40.0 11/19/2022   MCV 93.7 11/19/2022   PLT 399 11/19/2022      Component Value Date/Time   NA 140 11/19/2022 1315   NA 139 02/24/2022 1001   K 4.0 11/19/2022 1315   CL 105 11/19/2022 1315   CO2 22 11/19/2022 1315   GLUCOSE 95 11/19/2022 1315   BUN 8 11/19/2022 1315   BUN 7 (L) 02/24/2022 1001   CREATININE 0.58 11/19/2022 1315   CREATININE 0.65 06/17/2021 0955   CALCIUM 9.5 11/19/2022 1315   PROT 6.4 02/24/2022 1001   ALBUMIN 4.2 06/17/2021 0955   ALBUMIN 4.5 08/21/2020 1519   AST 16 06/17/2021 0955   ALT 15 06/17/2021 0955   ALKPHOS 44 06/17/2021 0955   BILITOT 0.5 06/17/2021 0955   GFRNONAA >60 11/19/2022 1315   GFRNONAA >60 06/17/2021 0955   GFRAA 111 08/21/2020 1519   Lab Results  Component Value Date   CHOL 158 01/05/2021   HDL 44 01/05/2021   LDLCALC 100 (H) 01/05/2021   TRIG 70 01/05/2021   CHOLHDL 3.6 01/05/2021   Lab Results  Component Value Date   HGBA1C 5.8 (H) 02/24/2022   Lab Results  Component Value Date   VITAMINB12 710 02/24/2022   Lab Results  Component Value Date   TSH 0.902 03/05/2021      ASSESSMENT AND PLAN 77 y.o. year old female  has a past medical  history of Anxiety, Arthritis, Autoimmune hemolytic anemia ([place]) (12/2020), Depression, DVT (deep venous thrombosis) (HCC), Dysphagia, GERD (gastroesophageal reflux disease), Hypothyroidism, LYMPHADENOPATHY, MIGRAINE HEADACHE, Neuromuscular disorder ([place]), PFO (patent foramen ovale), Sleep apnea, Stress incontinence, and Stroke ([place]) (01/05/2021). here with:  Peripheral neuropathy  -Continue gabapentin 300 mg tablets okay to take 300 mg every 2 hours for total of 8 doses or you can take 600 mg 4 times a day -Advised if symptoms worsen or she develops new symptoms she should let us know -Follow-up in 6 months or sooner if needed  The patient is microphone was not working.  We were able to stay connected through the video in order to see each other but I did have to call her on the phone in order to hear each other.   Ward Givens, MSN, NP-C 12/03/2022, 10:30 AM [facility] [address], [place], [place] [address] [phone]

## 2022-12-09 ENCOUNTER — Ambulatory Visit: Payer: Medicare HMO

## 2022-12-09 ENCOUNTER — Ambulatory Visit (INDEPENDENT_AMBULATORY_CARE_PROVIDER_SITE_OTHER): Payer: Medicare HMO | Admitting: Podiatry

## 2022-12-09 ENCOUNTER — Other Ambulatory Visit: Payer: Self-pay | Admitting: Podiatry

## 2022-12-09 ENCOUNTER — Ambulatory Visit (INDEPENDENT_AMBULATORY_CARE_PROVIDER_SITE_OTHER): Payer: Medicare HMO

## 2022-12-09 ENCOUNTER — Other Ambulatory Visit: Payer: Self-pay

## 2022-12-09 ENCOUNTER — Telehealth: Payer: Self-pay | Admitting: *Deleted

## 2022-12-09 DIAGNOSIS — M2032 Hallux varus (acquired), left foot: Secondary | ICD-10-CM | POA: Diagnosis not present

## 2022-12-09 DIAGNOSIS — Z9889 Other specified postprocedural states: Secondary | ICD-10-CM

## 2022-12-09 MED ORDER — HYDROCODONE-ACETAMINOPHEN 5-325 MG PO TABS: 1.0000 | ORAL_TABLET | ORAL | 0 refills | Status: DC

## 2022-12-09 MED ORDER — HYDROCODONE-ACETAMINOPHEN 5-325 MG PO TABS: 2.0000 | ORAL_TABLET | Freq: Four times a day (QID) | ORAL | 0 refills | Status: AC | PRN

## 2022-12-09 MED ORDER — HYDROCODONE-ACETAMINOPHEN 5-325 MG PO TABS: 2.0000 | ORAL_TABLET | Freq: Four times a day (QID) | ORAL | 0 refills | Status: DC | PRN

## 2022-12-09 NOTE — Progress Notes (Signed)
Patient presents today for post op visit # 2 , patient of Dr. Sherryle Lis   POV #2 DOS 3/1/20224   Patient presents in [employer] . Denies any falls or injury to the foot. Foot is slightly swollen. No signs of infection. No calf pain or shortness of breath. Bandages dry and intact. Incision is intact.  Taking antibiotic as prescribed and pain medication    BP: 146/86 P: 82   Sutures were removed.Dr. Sherryle Lis did take a look at the foot today as well.   Foot redressed today and placed back in the boot. Reviewed icing and elevation. Patient will follow up with Dr. Sherryle Lis for POV# 3 in 3 weeks.

## 2022-12-09 NOTE — Telephone Encounter (Signed)
New Rx sent to pharmacy to clarify

## 2022-12-09 NOTE — Telephone Encounter (Signed)
Lattie Haw with the pharmacy is calling to clarify the directions for a prescription( Hydrocodone-ace-5-325mg  ,2 tablets per week as needed for migraine)just sent,wanted to make sure that it is correct before filling, please advise.

## 2022-12-09 NOTE — Progress Notes (Signed)
Patient was seen for postoperative visit today.  Sutures were removed uneventfully, there was distal translation of the Kirschner wire of the second toe, new radiographs taken today show little purchase in the metatarsal head.  I recommended removing a portion of the wire, it was translated and of a centimeter distal to that the toe had range of motion, was left in the toe and cut short and recapped uneventfully.  Advised she may begin bathing with Neosporin to be applied to the pin sites before and after.  May be WBAT in CAM boot, continue ice and elevation.  I will see her back in 3 weeks for new radiographs and transition to surgical shoe or remaining in CAM boot pending radiographs  Lanae Crumbly, DPM 12/09/2022

## 2022-12-21 ENCOUNTER — Other Ambulatory Visit: Payer: Self-pay | Admitting: Urology

## 2022-12-29 ENCOUNTER — Encounter: Payer: Medicare HMO | Admitting: Podiatry

## 2022-12-30 ENCOUNTER — Ambulatory Visit (INDEPENDENT_AMBULATORY_CARE_PROVIDER_SITE_OTHER): Payer: Medicare HMO | Admitting: Podiatry

## 2022-12-30 ENCOUNTER — Encounter: Payer: Medicare HMO | Admitting: Podiatry

## 2022-12-30 ENCOUNTER — Ambulatory Visit (INDEPENDENT_AMBULATORY_CARE_PROVIDER_SITE_OTHER): Payer: Medicare HMO

## 2022-12-30 ENCOUNTER — Other Ambulatory Visit: Payer: Self-pay

## 2022-12-30 ENCOUNTER — Encounter (HOSPITAL_BASED_OUTPATIENT_CLINIC_OR_DEPARTMENT_OTHER): Payer: Self-pay | Admitting: Urology

## 2022-12-30 DIAGNOSIS — M2032 Hallux varus (acquired), left foot: Secondary | ICD-10-CM | POA: Diagnosis not present

## 2022-12-30 DIAGNOSIS — M2042 Other hammer toe(s) (acquired), left foot: Secondary | ICD-10-CM

## 2022-12-30 NOTE — Progress Notes (Addendum)
Anesthesia Review: hx of cva  and dvt 01-05-2021, s/p pfo closure,  202212-29-2022, hypothyroidism, anemia 2022 resolved followed now by pcp   PCP: dr Burton Apley Cardiologist :dr Clifton James, pre op clearance note for 11-19-2022 foot surgery Tabitha conte  pa states follow up in 1 year Chest x-ray :none EKG :11-09-2022 epic and on chart Echo :09-23-2022 epic Stress test:none Cardiac Cath : 08-28-2021 epic Activity level: does own housework Sleep Study/ CPAP :severe osa sleep study 11-23-2021 does not tolerate cpap Blood Thinner/ Instructions /Last Dose:stop plavix 5 days before surgery pre dr pace instructions last dose to be 01-05-2023, requested plavix clearance from Shanda Bumps at dr pace office Neurology lov Tabitha Penny np 12-03-2022 epicSpoke w/ via phone for pre-op interview---neurology clearance note [name] dated 01-03-2023 received and placed on pt chart for 01-11-2023 surgery  Lab needs dos---- I stat (gent)              Lab results------see above note COVID test -----patient states asymptomatic no test needed Arrive at -------645 am 01-11-2023 NPO after MN NO Solid Food.  Clear liquids from MN until---545 am Med rec completed Medications to take morning of surgery -----gabapentin, levothyroxine, lorazepam prn, sertraline Diabetic medication -----n/a Patient instructed no nail polish to be worn day of surgery Patient instructed to bring photo id and insurance card day of surgery Patient aware to have Driver (ride ) / caregiver   spouse [name]  for 24 hours after surgery  Patient Special Instructions -----none Pre-Op special Istructions -----none Patient verbalized understanding of instructions that were given at this phone interview. Patient denies shortness of breath, chest pain, fever, cough at this phone interview.

## 2023-01-02 ENCOUNTER — Encounter: Payer: Self-pay | Admitting: Podiatry

## 2023-01-02 NOTE — Progress Notes (Signed)
  Subjective:  Patient ID: [name], female    DOB: 08-07-1946,  MRN: [id number]  Chief Complaint  Patient presents with   Routine Post Op    POV #3 DOS 11/19/2022 LT GREAT TOE JOINT FUSION W/TENDON TRASFER & 2ND/3RD HAMMERTOE CORRECTION, POSS FUSION OF BOTH JOINTS, SHORTENING 2ND METATARSAL     77 y.o. female returns for post-op check.  Overall doing well not having much pain, swelling improving  Review of Systems: Negative except as noted in the HPI. Denies N/V/F/Ch.   Objective:  There were no vitals filed for this visit. There is no height or weight on file to calculate BMI. Constitutional Well developed. Well nourished.  Vascular Foot warm and well perfused. Capillary refill normal to all digits.  Calf is soft and supple, no posterior calf or knee pain, negative Homans' sign  Neurologic Normal speech. Oriented to person, place, and time. Epicritic sensation to light touch grossly present bilaterally.  Dermatologic Incision well-healed not hypertrophic  Orthopedic: She has mild edema and minimal tenderness to palpation noted about the surgical site.   Multiple view plain film radiographs: Good correction noted, no complication of hardware good early consolidation across fusion sites and osteotomies Assessment:   1. Hallux hammertoe, left   2. Hammertoe of left foot    Plan:  Patient was evaluated and treated and all questions answered.  S/p foot surgery left -Overall doing very well.  Kirschner wires removed today uneventfully.  Continue cam boot for another 2 weeks and then transition back to regular shoe gear gradually.  Begin range of motion exercises of the digits.  I will see her back in 6 weeks for new radiographs.  Return in about 6 weeks (around 02/10/2023) for post op (new x-rays).

## 2023-01-03 ENCOUNTER — Telehealth: Payer: Self-pay | Admitting: *Deleted

## 2023-01-03 NOTE — Telephone Encounter (Signed)
[facility] consent for Surgical Clearance.

## 2023-01-03 NOTE — Telephone Encounter (Signed)
Patient is on gabapentin for peripheral neuropathy.  Advised that okay to hold gabapentin prior to surgery and restart after surgery

## 2023-01-04 ENCOUNTER — Telehealth: Payer: Self-pay | Admitting: Cardiovascular Disease

## 2023-01-04 NOTE — Telephone Encounter (Signed)
Clearance faxed back to [facility] urology urology along with last office note. Received a receipt of confirmation.

## 2023-01-04 NOTE — Telephone Encounter (Signed)
   Pre-operative Risk Assessment    Patient Name: [name]  DOB: 03-08-1946 MRN: [id number]      Request for Surgical Clearance    Procedure:   Cystoscopy with bulkamid injection   Date of Surgery:  Clearance 01/11/23                                 Surgeon:  Dr. Ander Slade Group or Practice Name:  [facility]  Phone number:  [phone]  Fax number:  [phone]   Type of Clearance Requested:   - Medical  - Pharmacy:  Hold Clopidogrel (Plavix) 5 days prior(to start 01/06/23)    Type of Anesthesia:  MAC   Additional requests/questions:    Alben Spittle   01/04/2023, 1:42 PM

## 2023-01-04 NOTE — Telephone Encounter (Signed)
PRIMARY CARD IS DR. Clifton James. Left message for the pt to call back to schedule a tele pre op appt. Pt is going to need to be added on tomorrow, due to procedure date and med held.

## 2023-01-04 NOTE — Telephone Encounter (Signed)
   Name: [name]  DOB: 06-28-1946  MRN: [id number]  Primary Cardiologist: None  Chart reviewed as part of pre-operative protocol coverage. Because of Anavi M Veitch's past medical history and time since last visit, she will require a follow-up telephone visit in order to better assess preoperative cardiovascular risk.  Pre-op covering staff: - Please schedule appointment and call patient to inform them. If patient already had an upcoming appointment within acceptable timeframe, please add "pre-op clearance" to the appointment notes so provider is aware. - Please contact requesting surgeon's office via preferred method (i.e, phone, fax) to inform them of need for appointment prior to surgery.  Clopidogrel (Plavix) can be held for 5 days prior to her surgery and resumed as soon as possible post op. Guidance should come from neuro.   Sharlene Dory, PA-C  01/04/2023, 4:39 PM

## 2023-01-05 NOTE — Telephone Encounter (Signed)
Left message x 2 to call back to schedule a tele visit. We have just been asked as of yesterday 01/04/23 to provide clearance and Plavix recommendations for a procedure to be done 01/11/23. Pt would need to begin holding Plavix as of tomorrow if for a 5 day hold. I am going to send FYI note to the surgeon office, that they may need to push procedure if we cannot reach the pt.

## 2023-01-06 NOTE — Telephone Encounter (Signed)
Per Cala Bradford in our scheduling team, the pt called back and asked for in office appt. Pt ha been scheduled 01/10/23 with Micah Flesher, PAC. I will update all parties involved.

## 2023-01-06 NOTE — Telephone Encounter (Signed)
Left message x 3 as of today. Will update the requesting office procedure will probably need to be post poned until the pt has be cleared by the pre op team. Pt needs to set up a tele visit for pre op clearance. I will remove from the pre op call back at this time.

## 2023-01-07 NOTE — Progress Notes (Unsigned)
Cardiology Office Note:    Date:  01/10/2023   ID:  [name], Tabitha October 31, 1945, MRN [id number]  PCP:  Burton Apley, MD   [facility] Providers Cardiologist:  Verne Carrow, MD { Referring MD: Burton Apley, MD   Chief Complaint  Patient presents with   Pre-op Exam    History of Present Illness:    [name] is a 77 y.o. female with a hx of CVA 11/20/2018 s/p PFO closure on 09/09/2021, hypothyroidism, OSA not on CPAP, and neuropathy.   She was hospitalized due to leukocytosis and severe anemia with a hemoglobin of 5.7.  She was diagnosed with.  Hospitalization was complicated by acute CVA and extremity edema.  Echocardiogram also suggested.  TEE was positive for PFO and atrial shunting.  She was referred to Dr. Excell Seltzer and subsequently underwent 08/2021. She was last seen in clinic 11/09/22 and was doing well at that time. This visit was for preop clearance for foot surgery. She reported chest pain consistent with MSK/costochondritis pain. She was able to complete 4.0 METS without angina and felt stable for surgery.   She presents today for preoperative risk evaluation prior to cystoscopy and Bulkamid injection.  Request for Plavix hold for 5 days.  Prior to her recent hammertoe surgery, she was able to complete greater than 4.0 METS without angina. She was walking 60 min several times per week. Now with her boot on, she can walk about 30 minutes. She denies cardiac complaints.    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   Depression    DVT (deep venous thrombosis)    acute right peroneal DVT & age indeterminant left popliteal DVT 4//1922, s/p Xarelto x 6 months   Fungal infection    around lips started 12-15-2022 on fluconazole for   GERD (gastroesophageal reflux disease)    Hypothyroidism    melanoma right upper thigh 2001   MIGRAINE HEADACHE    Neuromuscular disorder    condition where  muscle separates from bone left chest, quarter size, pain intermittent   Neuropathy    feet   PFO (patent foramen ovale)    closure 09/17/21   Sleep apnea    does not use CPAP   Stress incontinence    Stroke 01/05/2021   Wears glasses     Past Surgical History:  Procedure Laterality Date   BALLOON DILATION N/A 09/18/2020   Procedure: BALLOON DILATION;  Surgeon: Kerin Salen, MD;  Location: [facility] ENDOSCOPY;  Service: Gastroenterology;  Laterality: N/A;   bilateral bunion removal     yrs ago   BUBBLE STUDY  07/03/2021   Procedure: BUBBLE STUDY;  Surgeon: Lewayne Bunting, MD;  Location: [facility] ENDOSCOPY;  Service: Cardiovascular;;   DRUG INDUCED ENDOSCOPY Bilateral 07/28/2022   Procedure: DRUG INDUCED ENDOSCOPY;  Surgeon: Christia Reading, MD;  Location: [facility];  Service: ENT;  Laterality: Bilateral;   ESOPHAGOGASTRODUODENOSCOPY (EGD) WITH PROPOFOL N/A 09/18/2020   Procedure: ESOPHAGOGASTRODUODENOSCOPY (EGD) WITH PROPOFOL;  Surgeon: Kerin Salen, MD;  Location: [facility] ENDOSCOPY;  Service: Gastroenterology;  Laterality: N/A;   EYE SURGERY Bilateral    Cataract L eye 1-20, R eye 3-20    FOOT ARTHRODESIS Left 11/19/2022   Procedure: ARTHRODESIS FOOT WITH BONE GRAFT;  Surgeon: Edwin Cap, DPM;  Location: [facility] OR;  Service: Podiatry;  Laterality: Left;   FOREIGN BODY REMOVAL  09/18/2020   Procedure: FOREIGN BODY REMOVAL;  Surgeon:  Kerin Salen, MD;  Location: [facility] ENDOSCOPY;  Service: Gastroenterology;;   Steward Drone FUSION Left 11/19/2022   Procedure: HALLUX METATARSAL PHALANGEAL JOINT FUSION;  Surgeon: Edwin Cap, DPM;  Location: [facility] OR;  Service: Podiatry;  Laterality: Left;   HAMMER TOE SURGERY Left 11/19/2022   Procedure: HAMMER TOE CORRECTION SECOND/THIRD;  Surgeon: Edwin Cap, DPM;  Location: [facility] OR;  Service: Podiatry;  Laterality: Left;   METATARSAL OSTEOTOMY Left 11/19/2022   Procedure: METATARSAL OSTEOTOMY;  Surgeon: Edwin Cap, DPM;  Location: [facility] OR;  Service:  Podiatry;  Laterality: Left;  BLOCK   MOHS SURGERY  2001   upper right thigh   PATENT FORAMEN OVALE(PFO) CLOSURE N/A 08/28/2021   Procedure: PATENT FORAMEN OVALE (PFO) CLOSURE;  Surgeon: Tonny Bollman, MD;  Location: [facility]IVE CV LAB;  Service: Cardiovascular;  Laterality: N/A;   REVERSE SHOULDER ARTHROPLASTY Right 03/16/2019   Procedure: REVERSE SHOULDER ARTHROPLASTY;  Surgeon: Beverely Low, MD;  Location: [facility] ORS;  Service: Orthopedics;  Laterality: Right;  interscalene block   TEE WITHOUT CARDIOVERSION N/A 07/03/2021   Procedure: TRANSESOPHAGEAL ECHOCARDIOGRAM (TEE);  Surgeon: Lewayne Bunting, MD;  Location: [facility] ENDOSCOPY;  Service: Cardiovascular;  Laterality: N/A;   TUBAL LIGATION     yrs ago    Current Medications: Current Meds  Medication Sig   acetaminophen (TYLENOL) 500 MG tablet Take 1,000 mg by mouth every 4 (four) hours as needed for headache.   Calcium Carbonate+Vitamin D 600-200 MG-UNIT TABS Take 1 tablet by mouth 2 (two) times daily.   Carboxymethylcellulose Sod PF (EQ RESTORE PLUS LUBRICANT EYE) 0.5 % SOLN Place 1 drop into both eyes daily as needed (Dry eye).   clopidogrel (PLAVIX) 75 MG tablet Take 75 mg by mouth daily.   fluconazole (DIFLUCAN) 150 MG tablet Take 150 mg by mouth daily. For rash around mouth started on 12-15-2022   folic acid (FOLVITE) 1 MG tablet Take 2 tablets (2 mg total) by mouth daily. (Patient taking differently: Take 1 mg by mouth daily.)   gabapentin (NEURONTIN) 300 MG capsule TAKE 1 TO 2 CAPSULES FOUR TIMES DAILY AS NEEDED (Patient taking differently: Take 300 mg by mouth See admin instructions. Take 300 mg eight times a day)   HYDROcodone-acetaminophen (NORCO/VICODIN) 5-325 MG tablet Take 1 tablet by mouth every 6 (six) hours as needed for moderate pain. 2 per week prn   hydrocortisone 2.5 % cream Apply 1 Application topically in the morning. On face   levothyroxine (SYNTHROID) 112 MCG tablet Take 112 mcg by mouth daily before breakfast.    LORazepam (ATIVAN) 2 MG tablet Take 2 mg by mouth 3 (three) times daily as needed for anxiety.   methocarbamol (ROBAXIN) 750 MG tablet Take 750 mg by mouth 2 (two) times daily as needed for muscle spasms.   Multiple Vitamins-Minerals (MULTIVITAMIN WITH MINERALS) tablet Take 1 tablet by mouth daily. Complete   mupirocin ointment (BACTROBAN) 2 % Apply 1 Application topically every evening.   nystatin (MYCOSTATIN/NYSTOP) powder Apply 1 application topically 3 (three) times daily as needed (irritation).   omeprazole (PRILOSEC) 20 MG capsule Take 20 mg by mouth as needed.   polycarbophil (FIBERCON) 625 MG tablet Take 625 mg by mouth in the morning and at bedtime.   sertraline (ZOLOFT) 50 MG tablet Take 50 mg by mouth daily.   triamcinolone cream (KENALOG) 0.1 % 1 application daily as needed (Vaginal irritations).     Allergies:   Oxycodone, Aspirin, Imipenem, Methocarbamol, Statins, Losartan, Cephalexin, Clindamycin/lincomycin, Contrast media [iodinated contrast media], Losartan  potassium-hctz, Methylprednisolone, Penicillins, and Quinolones   Social History   Socioeconomic History   Marital status: Married    Spouse name: Not on file   Number of children: Not on file   Years of education: Not on file   Highest education level: Not on file  Occupational History   Not on file  Tobacco Use   Smoking status: Former    Types: Cigarettes    Quit date: 1969    Years since quitting: 55.3   Smokeless tobacco: Never  Vaping Use   Vaping Use: Never used  Substance and Sexual Activity   Alcohol use: No   Drug use: No   Sexual activity: Not Currently    Birth control/protection: Post-menopausal  Other Topics Concern   Not on file  Social History Narrative   Lives at home with husband (has mild Dementia) of 55 years   Right handed    Caffeine: 1 cup tea/day   Social Determinants of Health   Financial Resource Strain: Not on file  Food Insecurity: Not on file  Transportation Needs: Not on  file  Physical Activity: Not on file  Stress: Not on file  Social Connections: Not on file     Family History: The patient's family history includes Arthritis in her brother and sister; Diabetes in her brother and brother; Heart disease in her mother; Neuropathy in her brother and sister; Stroke in her father.  ROS:   Please see the history of present illness.     All other systems reviewed and are negative.  EKGs/Labs/Other Studies Reviewed:    The following studies were reviewed today:    EKG:  EKG is not ordered today.  Recent Labs: 11/19/2022: BUN 8; Creatinine, Ser 0.58; Hemoglobin 14.1; Platelets 399; Potassium 4.0; Sodium 140  Recent Lipid Panel    Component Value Date/Time   CHOL 158 01/05/2021 0317   TRIG 70 01/05/2021 0317   HDL 44 01/05/2021 0317   CHOLHDL 3.6 01/05/2021 0317   VLDL 14 01/05/2021 0317   LDLCALC 100 (H) 01/05/2021 0317     Risk Assessment/Calculations:                Physical Exam:    VS:  BP 130/82   Pulse 73   Ht  (1.651 m)   Wt 168 lb (76.2 kg)   SpO2 95%   BMI 27.96 kg/m     Wt Readings from Last 3 Encounters:  01/10/23 168 lb (76.2 kg)  11/19/22 168 lb (76.2 kg)  11/09/22 167 lb 9.6 oz (76 kg)     GEN: = Well nourished, well developed in no acute distress HEENT: Normal NECK: No JVD; No carotid bruits LYMPHATICS: No lymphadenopathy CARDIAC: =RRR, no murmurs, rubs, gallops RESPIRATORY:  Clear to auscultation without rales, wheezing or rhonchi  ABDOMEN: Soft, non-tender, non-distended MUSCULOSKELETAL:  No edema; No deformity  SKIN: Warm and dry NEUROLOGIC:  Alert and oriented x 3 PSYCHIATRIC:  Normal affect   ASSESSMENT:    1. Preoperative clearance   2. PFO (patent foramen ovale)   3. Hyperlipidemia, unspecified hyperlipidemia type    PLAN:    In order of problems listed above:  PFO closure 2022 Remains on plavix Also history of stroke May hold plavix 5 days prior to surgery   Hyperlipidemia with  LDL goal < 70 (hx of CVA) She has not been controlled with statins Discussed PCSK9i at last visit, but she declined appt with PharmD Needs updated lipid panel - this is  followed by PCP. She is now agreeable to speaking with pharmD about PCSK9i. Will refer in about three months once finished with all other surgeries.   Preoperative risk evaluation for MACE She has a 0.9% risk of MACE during the perioperative period. She can complete more than 4.0 METS without angina - she is able to walk 60 min several times per week - limited now by recent hammertoe surgery still in boot, but can still walk 30 min with her boot. No unstable cardiac conditions. She can proceed with surgery without additional testing.  She is also expecting to have inspire placed for sleep apnea. I do not suspect she will need further cardiac testing if she is not having any new cardiac symptoms.    May hold plavix for 5 days   Follow up with Dr. Clifton James in 1 year.      Medication Adjustments/Labs and Tests Ordered: Current medicines are reviewed at length with the patient today.  Concerns regarding medicines are outlined above.  Orders Placed This Encounter  Procedures   AMB Referral to [facility] Pharm-D   No orders of the defined types were placed in this encounter.   Patient Instructions   Medication Instructions:  Your physician recommends that you continue on your current medications as directed. Please refer to the Current Medication list given to you today.  *If you need a refill on your cardiac medications before your next appointment, please call your pharmacy*  Follow-Up: At [facility], you and your health needs are our priority.  As part of our continuing mission to provide you with exceptional heart care, we have created designated Provider Care Teams.  These Care Teams include your primary Cardiologist (physician) and Advanced Practice Providers (APPs -  Physician Assistants and Nurse  Practitioners) who all work together to provide you with the care you need, when you need it.  We recommend signing up for the patient portal called "MyChart".  Sign up information is provided on this After Visit Summary.  MyChart is used to connect with patients for Virtual Visits (Telemedicine).  Patients are able to view lab/test results, encounter notes, upcoming appointments, etc.  Non-urgent messages can be sent to your provider as well.   To learn more about what you can do with MyChart, go to [email or web address].    Your next appointment:   12 month(s)  Provider:   Verne Carrow, MD      You have been referred to: Clinical Pharmacist to discuss PCSK9 (please schedule in ~3 months)      Signed, Marcelino Duster, PA  01/10/2023 9:26 AM    [facility]

## 2023-01-07 NOTE — H&P (Signed)
CC/HPI: cc: nocturia, urinary incontinence   11/10/22: 77 year old woman with a history of nocturia every 2 hours as well as urinary incontinence here for second opinion. She underwent a urodynamic study that did show stress urinary continence but no bladder instability. She was also not able to void and empty her bladder well. She has been on oxybutynin for many years but has a bad headache with this and does not feel that it is helping her symptoms. She is also tried and failed Myrbetriq before. 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 She stops drinking around 6 PM and goes to bed anywhere between 8 and 10 PM. She drinks water and tea during the day. No alcohol. She is scheduled for an inspire sleep apnea implant in March.    UDS SUMMARY  [name] held a max capacity of approx. 370 mls. Her 1st sensation was felt at 189 mls. There was positive SUI. Please see information above. No instability was noted. She did not generate a voluntary contraction and void during the study but did void 400 mls when study was complete in a measurement container. PVR was approx. 0 mls. X-ray not included due to contrast allergy.   Overactive bladder validate question screener: 2, 1, 1, 2, 3, 3, 2, 2=16/40   12/30/2022: [name] is a 77 year old female who presents today for preoperative appointment. She is scheduled to undergo urethral bulking agent with Bulkamid on 01/11/2023. She reports she is looking forward to this and is ready to not leak as much. She currently denies changes to her medications or health history. She denies dysuria, gross hematuria. She denies chest pain and shortness of breath.     ALLERGIES: Aspirin Clindamycin Iodinated Contrast Media - Oral and IV Dye Losartan Potassium-HCTZ TABS Methyprednisolone Oxybutynin OXYCODONE Pencillins Primaxin Quinolones statins Zosyn   MEDICATIONS: Levothyroxine Sodium 112 mcg tablet  Omeprazole 20 mg tablet, delayed release  Oxybutynin Chloride  Er 10 mg tablet, extended release 24 hr 1 tablet PO Daily  Acetaminophen  Calcium + D  Clopidogrel 75 mg tablet  Fiber Therapy  Folic Acid  Gabapentin 300 mg capsule  Hydrocodone-Acetaminophen 5 mg-325 mg tablet  Lorazepam 2 mg tablet  Methocarbamol 750 mg tablet  Multivitamin  Nystatin  Restore Plus  Sertraline Hcl 50 mg tablet  Triamcinolone Acetonide 0.1 % cream    GU PSH: Complex cystometrogram, w/ void pressure and urethral pressure profile studies, any technique - 08/17/2022 Complex Uroflow - 08/17/2022 Cystoscopy - 06/09/2022 Emg surf Electrd - 08/17/2022 Intrabd voidng Press - 08/17/2022      PSH Notes: Bone marrow surgery 2022, PFO & PPO 2022     Toe nail on left foot removed in August 2023   NON-GU PSH: Bilateral Tubal Ligation Correct Bunion, Bilateral Removal of skin cancer Rotator cuff surgery, Right Shoulder Arthroscopy/surgery, Right         GU PMH: Mixed incontinence - 11/10/2022, - 10/01/2022, - 08/17/2022, - 07/21/2022, - 06/09/2022, - 04/23/2022 Nocturia - 11/10/2022 Nocturnal Enuresis - 10/01/2022, - 06/09/2022, - 04/23/2022 Urinary Frequency - 08/17/2022, - 07/21/2022, - 04/23/2022      PMH Notes:  1898-09-20 00:00:00 - Note: Normal Routine History And Physical Adult   neuropathy, muscle spasms    NON-GU PMH: Anxiety Arthritis Depression GERD Heart disease, unspecified Hypercholesterolemia Hypertension Hyperthyroidism Skin Cancer, History Sleep Apnea Stroke/TIA     FAMILY HISTORY: 2 sons - Son Arthritis - Brother, Sister neuropathy - Sister Parkinson's Disease - Brother renal failure - Mother stroke - Father Tuberculosis -  Grandmother    SOCIAL HISTORY: Marital Status: Married Preferred Language: English; Ethnicity: Not Hispanic Or Latino; Race: White Current Smoking Status: Patient has never smoked.  <DIV'  Tobacco Use Assessment Completed:  Used Tobacco in last 30 days?   Does not use smokeless tobacco. Has never drank.  Drinks 2  caffeinated drinks per day. Patient's occupation is/was retired.     REVIEW OF SYSTEMS:      GU Review Female:  Patient reports frequent urination and leakage of urine. Patient denies burning /pain with urination, get up at night to urinate, stream starts and stops, trouble starting your stream, have to strain to urinate, and being pregnant.     Gastrointestinal (Upper):  Patient denies nausea, vomiting, and indigestion/ heartburn.     Gastrointestinal (Lower):  Patient denies diarrhea and constipation.     Constitutional:  Patient denies fever, night sweats, weight loss, and fatigue.     Skin:  Patient denies skin rash/ lesion and itching.     Eyes:  Patient denies blurred vision and double vision.     Ears/ Nose/ Throat:  Patient denies sore throat and sinus problems.     Hematologic/Lymphatic:  Patient denies swollen glands and easy bruising.     Cardiovascular:  Patient denies leg swelling and chest pains.     Respiratory:  Patient denies cough and shortness of breath.     Musculoskeletal:  Patient denies joint pain and back pain.     Neurological:  Patient denies headaches and dizziness.     Psychologic:  Patient denies depression and anxiety.     VITAL SIGNS:        12/30/2022 10:50 AM      Weight 166 lb / 75.3 kg      Height 65 in / 165.1 cm      BP 122/63 mmHg      Heart Rate 84 /min      Temperature 97.1 F / 36.1 C      BMI 27.6 kg/m      MULTI-SYSTEM PHYSICAL EXAMINATION:       Constitutional: Well-nourished. No physical deformities. Normally developed. Good grooming.      Neck: Neck symmetrical, not swollen. Normal tracheal position.      Respiratory: Normal breath sounds. No labored breathing, no use of accessory muscles.       Cardiovascular: Regular rate and rhythm. No murmur, no gallop. Normal temperature, normal extremity pulses, no swelling, no varicosities.       Lymphatic: No enlargement of neck, axillae, groin.      Skin: No paleness, no jaundice, no cyanosis. No  lesion, no ulcer, no rash.      Neurologic / Psychiatric: Oriented to time, oriented to place, oriented to person. No depression, no anxiety, no agitation.      Gastrointestinal: No mass, no tenderness, no rigidity, non obese abdomen.      Eyes: Normal conjunctivae. Normal eyelids.      Ears, Nose, Mouth, and Throat: Left ear no scars, no lesions, no masses. Right ear no scars, no lesions, no masses. Nose no scars, no lesions, no masses. Normal hearing. Normal lips.      Musculoskeletal: Normal gait and station of head and neck.              Complexity of Data:   Source Of History:  Patient  Records Review:  Previous Doctor Records, Previous Patient Records  Urine Test Review:  Urinalysis    12/30/22  Urinalysis  Urine Appearance  Clear   Urine Color Yellow   Urine Glucose Neg mg/dL  Urine Bilirubin Neg mg/dL  Urine Ketones Neg mg/dL  Urine Specific Gravity 1.015   Urine Blood Neg ery/uL  Urine pH 5.5   Urine Protein Trace mg/dL  Urine Urobilinogen 0.2 mg/dL  Urine Nitrites Neg   Urine Leukocyte Esterase 1+ leu/uL  Urine WBC/hpf 0 - 5/hpf   Urine RBC/hpf NS (Not Seen)   Urine Epithelial Cells 0 - 5/hpf   Urine Bacteria NS (Not Seen)   Urine Mucous Not Present   Urine Yeast NS (Not Seen)   Urine Trichomonas Not Present   Urine Cystals NS (Not Seen)   Urine Casts NS (Not Seen)   Urine Sperm Not Present    PROCEDURES:    Urinalysis w/Scope  Dipstick Dipstick Cont'd Micro  Color: Yellow Bilirubin: Neg mg/dL WBC/hpf: 0 - 5/hpf  Appearance: Clear Ketones: Neg mg/dL RBC/hpf: NS (Not Seen)  Specific Gravity: 1.015 Blood: Neg ery/uL Bacteria: NS (Not Seen)  pH: 5.5 Protein: Trace mg/dL Cystals: NS (Not Seen)  Glucose: Neg mg/dL Urobilinogen: 0.2 mg/dL Casts: NS (Not Seen)   Nitrites: Neg Trichomonas: Not Present   Leukocyte Esterase: 1+ leu/uL Mucous: Not Present    Epithelial Cells: 0 - 5/hpf    Yeast: NS (Not Seen)    Sperm: Not Present    ASSESSMENT:     ICD-10  Details  1 GU:  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 Arita Miss. Notify the office for any changes to medications or health history. All questions were answered to the best of my ability.

## 2023-01-10 ENCOUNTER — Ambulatory Visit: Payer: Medicare HMO | Attending: Physician Assistant | Admitting: Physician Assistant

## 2023-01-10 ENCOUNTER — Encounter: Payer: Self-pay | Admitting: Physician Assistant

## 2023-01-10 VITALS — BP 130/82 | HR 73 | Ht 65.0 in | Wt 168.0 lb

## 2023-01-10 DIAGNOSIS — Q2112 Patent foramen ovale: Secondary | ICD-10-CM

## 2023-01-10 DIAGNOSIS — E785 Hyperlipidemia, unspecified: Secondary | ICD-10-CM

## 2023-01-10 DIAGNOSIS — Z01818 Encounter for other preprocedural examination: Secondary | ICD-10-CM | POA: Diagnosis not present

## 2023-01-10 NOTE — Patient Instructions (Addendum)
Medication Instructions:  Your physician recommends that you continue on your current medications as directed. Please refer to the Current Medication list given to you today.  *If you need a refill on your cardiac medications before your next appointment, please call your pharmacy*  Follow-Up: At [facility], you and your health needs are our priority.  As part of our continuing mission to provide you with exceptional heart care, we have created designated Provider Care Teams.  These Care Teams include your primary Cardiologist (physician) and Advanced Practice Providers (APPs -  Physician Assistants and Nurse Practitioners) who all work together to provide you with the care you need, when you need it.  We recommend signing up for the patient portal called "MyChart".  Sign up information is provided on this After Visit Summary.  MyChart is used to connect with patients for Virtual Visits (Telemedicine).  Patients are able to view lab/test results, encounter notes, upcoming appointments, etc.  Non-urgent messages can be sent to your provider as well.   To learn more about what you can do with MyChart, go to [email or web address].    Your next appointment:   12 month(s)  Provider:   Verne Carrow, MD      You have been referred to: Clinical Pharmacist to discuss PCSK9 (please schedule in ~3 months)

## 2023-01-10 NOTE — Anesthesia Preprocedure Evaluation (Signed)
Anesthesia Evaluation  Patient identified by MRN, date of birth, ID band Patient awake    Reviewed: Allergy & Precautions, NPO status , Patient's Chart, lab work & pertinent test results  History of Anesthesia Complications Negative for: history of anesthetic complications  Airway Mallampati: III  TM Distance: >3 FB Neck ROM: Full    Dental no notable dental hx. (+) Dental Advisory Given   Pulmonary sleep apnea , former smoker   Pulmonary exam normal        Cardiovascular hypertension, + DVT  Normal cardiovascular exam  Echo (limited; post-PFO closure) 09/23/22: IMPRESSIONS   1. Left ventricular ejection fraction, by estimation, is 55 to 60%. The  left ventricle has normal function. Left ventricular diastolic parameters  are consistent with Grade I diastolic dysfunction (impaired relaxation).   2. Right ventricular systolic function is normal. The right ventricular  size is normal. There is normal pulmonary artery systolic pressure.   3. S/p PFO closure with 25 mm amplatzer, well seated, no interatrial  shunt visualized . Agitated saline contrast bubble study was negative,  with no evidence of any interatrial shunt.   4. The aortic valve is tricuspid.   5. The inferior vena cava is normal in size with greater than 50%  respiratory variability, suggesting right atrial pressure of 3 mmHg.      Cardiac event monitor 07/03/21 - 08/01/21:  NSR  One non sustained VT 7 beat asymptomatic episode  PVC burden < 1%  No SVT or atrial fibrillation    Neuro/Psych  Headaches  Anxiety Depression    CVA    GI/Hepatic Neg liver ROS,GERD  Medicated,,  Endo/Other  Hypothyroidism    Renal/GU negative Renal ROS  negative genitourinary   Musculoskeletal  (+) Arthritis , Osteoarthritis,    Abdominal Normal abdominal exam  (+)   Peds  Hematology  (+) Blood dyscrasia, anemia   Anesthesia Other Findings    Reproductive/Obstetrics                             Anesthesia Physical Anesthesia Plan  ASA: 3  Anesthesia Plan: General   Post-op Pain Management: Toradol IV (intra-op)* and Tylenol PO (pre-op)*   Induction: Intravenous  PONV Risk Score and Plan: 3 and Ondansetron, Dexamethasone and Diphenhydramine  Airway Management Planned: LMA  Additional Equipment: None  Intra-op Plan:   Post-operative Plan: Extubation in OR  Informed Consent: I have reviewed the patients History and Physical, chart, labs and discussed the procedure including the risks, benefits and alternatives for the proposed anesthesia with the patient or authorized representative who has indicated his/her understanding and acceptance.     Dental advisory given  Plan Discussed with: Anesthesiologist and CRNA  Anesthesia Plan Comments: (PAT note written 11/18/2022 by Shonna Chock, PA-C.  )       Anesthesia Quick Evaluation

## 2023-01-11 ENCOUNTER — Ambulatory Visit (HOSPITAL_BASED_OUTPATIENT_CLINIC_OR_DEPARTMENT_OTHER)
Admission: RE | Admit: 2023-01-11 | Discharge: 2023-01-11 | Disposition: A | Discharge status: Home / Self Care | Payer: Medicare HMO | Attending: Urology | Admitting: Urology

## 2023-01-11 ENCOUNTER — Encounter (HOSPITAL_BASED_OUTPATIENT_CLINIC_OR_DEPARTMENT_OTHER)
Admission: RE | Disposition: A | Discharge status: Home / Self Care | Payer: Self-pay | Source: Home / Self Care | Attending: Urology

## 2023-01-11 ENCOUNTER — Encounter (HOSPITAL_BASED_OUTPATIENT_CLINIC_OR_DEPARTMENT_OTHER): Payer: Self-pay

## 2023-01-11 ENCOUNTER — Encounter (HOSPITAL_BASED_OUTPATIENT_CLINIC_OR_DEPARTMENT_OTHER): Payer: Self-pay | Admitting: Urology

## 2023-01-11 ENCOUNTER — Other Ambulatory Visit: Payer: Self-pay

## 2023-01-11 ENCOUNTER — Ambulatory Visit (HOSPITAL_BASED_OUTPATIENT_CLINIC_OR_DEPARTMENT_OTHER): Admit: 2023-01-11 | Payer: Medicare HMO | Admitting: Urology

## 2023-01-11 ENCOUNTER — Ambulatory Visit (HOSPITAL_BASED_OUTPATIENT_CLINIC_OR_DEPARTMENT_OTHER): Payer: Medicare HMO | Admitting: Anesthesiology

## 2023-01-11 DIAGNOSIS — D759 Disease of blood and blood-forming organs, unspecified: Secondary | ICD-10-CM | POA: Insufficient documentation

## 2023-01-11 DIAGNOSIS — N3946 Mixed incontinence: Secondary | ICD-10-CM | POA: Diagnosis not present

## 2023-01-11 DIAGNOSIS — M1990 Unspecified osteoarthritis, unspecified site: Secondary | ICD-10-CM | POA: Insufficient documentation

## 2023-01-11 DIAGNOSIS — N393 Stress incontinence (female) (male): Secondary | ICD-10-CM

## 2023-01-11 DIAGNOSIS — I10 Essential (primary) hypertension: Secondary | ICD-10-CM | POA: Diagnosis not present

## 2023-01-11 DIAGNOSIS — K219 Gastro-esophageal reflux disease without esophagitis: Secondary | ICD-10-CM | POA: Insufficient documentation

## 2023-01-11 DIAGNOSIS — Z8673 Personal history of transient ischemic attack (TIA), and cerebral infarction without residual deficits: Secondary | ICD-10-CM | POA: Insufficient documentation

## 2023-01-11 DIAGNOSIS — E039 Hypothyroidism, unspecified: Secondary | ICD-10-CM | POA: Diagnosis not present

## 2023-01-11 DIAGNOSIS — D649 Anemia, unspecified: Secondary | ICD-10-CM | POA: Diagnosis not present

## 2023-01-11 DIAGNOSIS — Z86718 Personal history of other venous thrombosis and embolism: Secondary | ICD-10-CM | POA: Diagnosis not present

## 2023-01-11 DIAGNOSIS — Z01818 Encounter for other preprocedural examination: Secondary | ICD-10-CM

## 2023-01-11 DIAGNOSIS — D638 Anemia in other chronic diseases classified elsewhere: Secondary | ICD-10-CM

## 2023-01-11 DIAGNOSIS — G4730 Sleep apnea, unspecified: Secondary | ICD-10-CM | POA: Diagnosis not present

## 2023-01-11 DIAGNOSIS — F418 Other specified anxiety disorders: Secondary | ICD-10-CM | POA: Diagnosis not present

## 2023-01-11 DIAGNOSIS — Z87891 Personal history of nicotine dependence: Secondary | ICD-10-CM

## 2023-01-11 HISTORY — DX: Polyneuropathy, unspecified: G62.9

## 2023-01-11 HISTORY — PX: CYSTOSCOPY WITH INJECTION: SHX1424

## 2023-01-11 HISTORY — DX: Presence of spectacles and contact lenses: Z97.3

## 2023-01-11 HISTORY — DX: Unspecified mycosis: B49

## 2023-01-11 LAB — POCT I-STAT, CHEM 8
BUN: 10 mg/dL (ref 8–23)
Calcium, Ion: 1.23 mmol/L (ref 1.15–1.40)
Chloride: 108 mmol/L (ref 98–111)
Creatinine, Ser: 0.5 mg/dL (ref 0.44–1.00)
Glucose, Bld: 102 mg/dL — ABNORMAL HIGH (ref 70–99)
HCT: 42 % (ref 36.0–46.0)
Hemoglobin: 14.3 g/dL (ref 12.0–15.0)
Potassium: 4.4 mmol/L (ref 3.5–5.1)
Sodium: 140 mmol/L (ref 135–145)
TCO2: 19 mmol/L — ABNORMAL LOW (ref 22–32)

## 2023-01-11 SURGERY — CYSTOSCOPY, WITH INJECTION OF BLADDER NECK OR BLADDER WALL
Anesthesia: Monitor Anesthesia Care

## 2023-01-11 SURGERY — CYSTOSCOPY, WITH INJECTION OF BLADDER NECK OR BLADDER WALL
Anesthesia: General | Site: Urethra

## 2023-01-11 MED ORDER — GENTAMICIN SULFATE 40 MG/ML IJ SOLN
5.0000 mg/kg | Freq: Once | INTRAVENOUS | Status: AC
  Administered 2023-01-11: 380 mg via INTRAVENOUS
  Filled 2023-01-11: qty 9.5

## 2023-01-11 MED ORDER — FENTANYL CITRATE (PF) 250 MCG/5ML IJ SOLN
INTRAMUSCULAR | Status: DC | PRN
  Administered 2023-01-11 (×2): 25 ug via INTRAVENOUS

## 2023-01-11 MED ORDER — PROMETHAZINE HCL 25 MG/ML IJ SOLN: 6.2500 mg | INTRAMUSCULAR | Status: DC | PRN

## 2023-01-11 MED ORDER — FENTANYL CITRATE (PF) 100 MCG/2ML IJ SOLN
INTRAMUSCULAR | Status: AC
  Filled 2023-01-11: qty 2

## 2023-01-11 MED ORDER — LIDOCAINE 2% (20 MG/ML) 5 ML SYRINGE
INTRAMUSCULAR | Status: DC | PRN
  Administered 2023-01-11: 40 mg via INTRAVENOUS

## 2023-01-11 MED ORDER — AMISULPRIDE (ANTIEMETIC) 5 MG/2ML IV SOLN: 10.0000 mg | Freq: Once | INTRAVENOUS | Status: DC | PRN

## 2023-01-11 MED ORDER — PROPOFOL 10 MG/ML IV BOLUS
INTRAVENOUS | Status: AC
  Filled 2023-01-11: qty 20

## 2023-01-11 MED ORDER — EPHEDRINE 5 MG/ML INJ
INTRAVENOUS | Status: AC
  Filled 2023-01-11: qty 10

## 2023-01-11 MED ORDER — EPHEDRINE SULFATE-NACL 50-0.9 MG/10ML-% IV SOSY
PREFILLED_SYRINGE | INTRAVENOUS | Status: DC | PRN
  Administered 2023-01-11: 5 mg via INTRAVENOUS

## 2023-01-11 MED ORDER — ONDANSETRON HCL 4 MG/2ML IJ SOLN
INTRAMUSCULAR | Status: DC | PRN
  Administered 2023-01-11: 4 mg via INTRAVENOUS

## 2023-01-11 MED ORDER — FENTANYL CITRATE (PF) 100 MCG/2ML IJ SOLN: 25.0000 ug | INTRAMUSCULAR | Status: DC | PRN

## 2023-01-11 MED ORDER — PROPOFOL 10 MG/ML IV BOLUS
INTRAVENOUS | Status: DC | PRN
  Administered 2023-01-11: 120 mg via INTRAVENOUS

## 2023-01-11 MED ORDER — LACTATED RINGERS IV SOLN
INTRAVENOUS | Status: DC
  Administered 2023-01-11: 1000 mL via INTRAVENOUS

## 2023-01-11 MED ORDER — ACETAMINOPHEN 500 MG PO TABS: 1000.0000 mg | ORAL_TABLET | Freq: Once | ORAL | Status: DC

## 2023-01-11 MED ORDER — WATER FOR IRRIGATION, STERILE IR SOLN
Status: DC | PRN
  Administered 2023-01-11: 3000 mL

## 2023-01-11 MED ORDER — ACETAMINOPHEN 500 MG PO TABS
ORAL_TABLET | ORAL | Status: AC
  Filled 2023-01-11: qty 2

## 2023-01-11 MED ORDER — ACETAMINOPHEN 500 MG PO TABS
500.0000 mg | ORAL_TABLET | Freq: Once | ORAL | Status: AC
  Administered 2023-01-11: 500 mg via ORAL

## 2023-01-11 SURGICAL SUPPLY — 20 items
BAG DRAIN URO-CYSTO SKYTR STRL (DRAIN) ×1 IMPLANT
BAG DRN UROCATH (DRAIN) ×1
CLOTH BEACON ORANGE TIMEOUT ST (SAFETY) ×1 IMPLANT
ELECT REM PT RETURN 9FT ADLT (ELECTROSURGICAL) ×1
ELECTRODE REM PT RTRN 9FT ADLT (ELECTROSURGICAL) ×1 IMPLANT
GLOVE BIO SURGEON STRL SZ 6.5 (GLOVE) ×1 IMPLANT
GOWN STRL REUS W/TWL LRG LVL3 (GOWN DISPOSABLE) ×1 IMPLANT
KIT TURNOVER CYSTO (KITS) ×1 IMPLANT
MANIFOLD NEPTUNE II (INSTRUMENTS) ×1 IMPLANT
NDL ASPIRATION 22 (NEEDLE) ×1 IMPLANT
NDL SAFETY ECLIP 18X1.5 (MISCELLANEOUS) ×1 IMPLANT
NEEDLE ASPIRATION 22 (NEEDLE) ×1 IMPLANT
PACK CYSTO (CUSTOM PROCEDURE TRAY) ×1 IMPLANT
SLEEVE SCD COMPRESS KNEE MED (STOCKING) ×1 IMPLANT
SYR 20ML LL LF (SYRINGE) ×1 IMPLANT
SYR CONTROL 10ML LL (SYRINGE) ×1 IMPLANT
SYSTEM URETHRAL BULK BULKAMID (Female Continence) IMPLANT
TUBE CONNECTING 12X1/4 (SUCTIONS) ×1 IMPLANT
TUBING UROLOGY SET (TUBING) ×1 IMPLANT
WATER STERILE IRR 3000ML UROMA (IV SOLUTION) ×1 IMPLANT

## 2023-01-11 NOTE — Anesthesia Postprocedure Evaluation (Signed)
Anesthesia Post Note  Patient: [name]  Procedure(s) Performed: CYSTOSCOPY WITH INJECTION OF BULKAMID (Urethra)     Patient location during evaluation: PACU Anesthesia Type: General Level of consciousness: sedated Pain management: pain level controlled Vital Signs Assessment: post-procedure vital signs reviewed and stable Respiratory status: spontaneous breathing and respiratory function stable Cardiovascular status: stable Postop Assessment: no apparent nausea or vomiting Anesthetic complications: no   No notable events documented.  Last Vitals:  Vitals:   01/11/23 0911 01/11/23 0915  BP:  128/81  Pulse:    Resp:    Temp: 36.8 C   SpO2: 96%     Last Pain:  Vitals:   01/11/23 0930  TempSrc:   PainSc: 2                  Rondell Pardon DANIEL

## 2023-01-11 NOTE — Transfer of Care (Signed)
Immediate Anesthesia Transfer of Care Note  Patient: [name]  Procedure(s) Performed: CYSTOSCOPY WITH INJECTION OF BULKAMID (Urethra)  Patient Location: PACU  Anesthesia Type:General  Level of Consciousness: awake and drowsy  Airway & Oxygen Therapy: Patient Spontanous Breathing and Patient connected to nasal cannula oxygen  Post-op Assessment: Report given to RN and Post -op Vital signs reviewed and stable  Post vital signs: Reviewed and stable  Last Vitals:  Vitals Value Taken Time  BP 125/68 01/11/23 0910  Temp    Pulse 74 01/11/23 0912  Resp 16 01/11/23 0912  SpO2 92 % 01/11/23 0912  Vitals shown include unvalidated device data.  Last Pain:  Vitals:   01/11/23 0724  TempSrc: Oral  PainSc: 5       Patients Stated Pain Goal: 5 (01/11/23 0724)  Complications: No notable events documented.

## 2023-01-11 NOTE — Discharge Instructions (Addendum)
Cystoscopy with Bulkamid patient instructions  Following a cystoscopy, a catheter (a flexible rubber tube) is sometimes left in place to empty the bladder. This may cause some discomfort or a feeling that you need to urinate. Your doctor determines the period of time that the catheter will be left in place. You may have bloody urine for two to three days (Call your doctor if the amount of bleeding increases or does not subside).  You may pass blood clots in your urine, especially if you had a biopsy. It is not unusual to pass small blood clots and have some bloody urine a couple of weeks after your cystoscopy. Again, call your doctor if the bleeding does not subside. You may have: Dysuria (painful urination) Frequency (urinating often) Urgency (strong desire to urinate)  These symptoms are common especially if medicine is instilled into the bladder or a ureteral stent is placed. Avoiding alcohol and caffeine, such as coffee, tea, and chocolate, may help relieve these symptoms. Drink plenty of water, unless otherwise instructed. Your doctor may also prescribe an antibiotic or other medicine to reduce these symptoms.  Cystoscopy results are available soon after the procedure; biopsy results usually take two to four days. Your doctor will discuss the results of your exam with you. Before you go home, you will be given specific instructions for follow-up care. Special Instructions:   If you are going home with a catheter in place do not take a tub bath until removed by your doctor.   You may resume your normal activities.   Do not drive or operate machinery if you are taking narcotic pain medicine.   Be sure to keep all follow-up appointments with your doctor.   Call Your Doctor If: The catheter is not draining You have severe pain You are unable to urinate You have a fever over 101 You have severe bleeding          You were given Tylenol before your surgical procedure today, please do not  take anymore Tylenol until after 2:30pm today Post Anesthesia Home Care Instructions  Activity: Get plenty of rest for the remainder of the day. A responsible individual must stay with you for 24 hours following the procedure.  For the next 24 hours, DO NOT: -Drive a car -Advertising copywriter -Drink alcoholic beverages -Take any medication unless instructed by your physician -Make any legal decisions or sign important papers.  Meals: Start with liquid foods such as gelatin or soup. Progress to regular foods as tolerated. Avoid greasy, spicy, heavy foods. If nausea and/or vomiting occur, drink only clear liquids until the nausea and/or vomiting subsides. Call your physician if vomiting continues.  Special Instructions/Symptoms: Your throat may feel dry or sore from the anesthesia or the breathing tube placed in your throat during surgery. If this causes discomfort, gargle with warm salt water. The discomfort should disappear within 24 hours.

## 2023-01-11 NOTE — Op Note (Signed)
Operative Note   Preoperative diagnosis:  1.  Stress urinary incontinence   Postoperative diagnosis: 1.  Stress urinary incontinence   Procedure(s): 1.  Cystoscopy with injection of bulkamid   Surgeon: Kasandra Knudsen, MD   Assistants:  None   Anesthesia:  General   Complications:  None   EBL:  minimal   Specimens: 1. none   Drains/Catheters: 1.  none   Intraoperative findings:   Normal urethra   Indication:  77 yo woman with symptomatic stress urinary incontinence.   Description of procedure:   After risks and benefits of the procedure discussed with the patient, informed consent was obtained.  The patient was taken to the operating placed in the supine position.  Anesthesia was induced and antibiotics were administered.  The patient was then repositioned in the dorsolithotomy position.  She was prepped and draped in usual sterile fashion a timeout performed with the attending present.  The cystoscope was assembled with the Bulkamid system.  It was then placed in the urethral meatus and advanced into the bladder under direct visualization.  Prior cystoscopy had been done which noted normal anatomic landmarks.  These were again verified during cystoscopy today.  The cystoscope was brought back to the bladder neck and the needle was advanced through the needle guide at the 1 o'clock position.  Once it was visualized and advanced it was rotated to the 5 o'clock position.  Bulkamid was then injected until blood was seen.  This was then repeated at the 11 o'clock position in the 7 o'clock position until coaptation was noted.   This concluded the case.  The patient's bladder was left with approximately 200 cc of sterile saline.  The patient emerged from anesthesia and was transferred the PACU in stable condition.   Plan:  Plan for patient to void in PACU prior to discharge.

## 2023-01-11 NOTE — Interval H&P Note (Signed)
History and Physical Interval Note:  01/11/2023 8:31 AM  [name]  has presented today for surgery, with the diagnosis of STRESS URINARY INCONTINENCE.  The various methods of treatment have been discussed with the patient and family. After consideration of risks, benefits and other options for treatment, the patient has consented to  Procedure(s) with comments: CYSTOSCOPY WITH INJECTION OF BULKAMID (N/A) - 45 MINS as a surgical intervention.  The patient's history has been reviewed, patient examined, no change in status, stable for surgery.  I have reviewed the patient's chart and labs.  Questions were answered to the patient's satisfaction.     Bellamarie Pflug D Ambrie Carte

## 2023-01-13 ENCOUNTER — Encounter (HOSPITAL_BASED_OUTPATIENT_CLINIC_OR_DEPARTMENT_OTHER): Payer: Self-pay | Admitting: Urology

## 2023-01-25 ENCOUNTER — Encounter: Payer: Medicare HMO | Admitting: Podiatry

## 2023-01-27 ENCOUNTER — Encounter: Payer: Medicare HMO | Admitting: Podiatry

## 2023-02-08 ENCOUNTER — Ambulatory Visit (INDEPENDENT_AMBULATORY_CARE_PROVIDER_SITE_OTHER): Payer: Medicare HMO | Admitting: Podiatry

## 2023-02-08 ENCOUNTER — Ambulatory Visit (INDEPENDENT_AMBULATORY_CARE_PROVIDER_SITE_OTHER): Payer: Medicare HMO

## 2023-02-08 DIAGNOSIS — M2042 Other hammer toe(s) (acquired), left foot: Secondary | ICD-10-CM

## 2023-02-08 DIAGNOSIS — M2032 Hallux varus (acquired), left foot: Secondary | ICD-10-CM | POA: Diagnosis not present

## 2023-02-08 MED ORDER — HYDROCODONE-ACETAMINOPHEN 5-325 MG PO TABS: 1.0000 | ORAL_TABLET | Freq: Four times a day (QID) | ORAL | 0 refills | Status: AC | PRN

## 2023-02-08 NOTE — Progress Notes (Signed)
  Subjective:  Patient ID: [name], female    DOB: April 03, 1946,  MRN: [id number]  Chief Complaint  Patient presents with   Routine Post Op   Bunions    POV #4 DOS 11/19/2022 LT GREAT TOE JOINT FUSION W/TENDON TRASFER & 2ND/3RD HAMMERTOE CORRECTION, POSS FUSION OF BOTH JOINTS, SHORTENING 2ND METATARSAL    77 y.o. female presents with the above complaint. History confirmed with patient.  She still having some pain in both toes especially underneath feel like they are quite stiff.  Great toe feels good  Objective:  Physical Exam: warm, good capillary refill, no trophic changes or ulcerative lesions, normal DP and PT pulses, normal sensory exam, and no pain in first MPJ fusion site, no edema, incision is not hypertrophic, she has edema in the second and third toes with some limited plantarflexion and dorsiflexion of the second and third MPJ.   Radiographs: Multiple views x-ray of the left foot: Good ration of fusion site of the first MTPJ, some remaining healing at the PIPJ fusion sites of 2 and 3 Assessment:   1. Hallux hammertoe, left   2. Hammertoe of left foot      Plan:  Patient was evaluated and treated and all questions answered.  Overall doing well her fusion site has completely healed, there is some residual healing in the PIPJ arthrodesis site which explains some of the swelling she is having.  She does have some limitation of motion at the MTP capsule, I encouraged her to continue significant range of motion exercises we discussed the option of physical therapy to work on this as well but she declined this.  She may continue regular shoe gear.  She does still have occasional severe sharp pain and I did refill her pain medication final time.  I will see her back in 2 months for follow-up.  Return in about 15 weeks (around 05/24/2023) for surgery follow up .

## 2023-02-10 ENCOUNTER — Encounter: Payer: Medicare HMO | Admitting: Podiatry

## 2023-02-28 ENCOUNTER — Telehealth: Payer: Self-pay | Admitting: Adult Health

## 2023-02-28 NOTE — Telephone Encounter (Signed)
Pt requesting sooner appointment. Stated both feet is going numb and it's moving up her leg.

## 2023-03-01 NOTE — Telephone Encounter (Signed)
Called the pt back and LVM with office number asking for call back.

## 2023-03-02 IMAGING — MR MR HEAD W/O CM
11 series · 48 of 48 positions shown · non-contrast
Comparison: None.

CLINICAL DATA: Mental status change, possible stroke

EXAM:
MRI HEAD WITHOUT CONTRAST
TECHNIQUE: Multiplanar, multiecho pulse sequences of the brain and surrounding
structures were obtained without intravenous contrast.

[Series 5: DWI · axial · 3.0mm · 1.36mm/px · z∈[-25,+112]mm · 8 of 96 slices shown (1 of 2)]
[im 1/96]
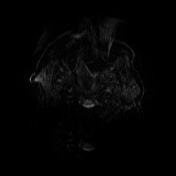
[im 14/96]
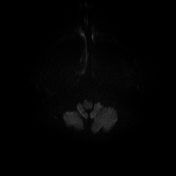
[im 28/96]
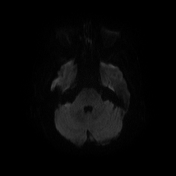
[im 41/96]
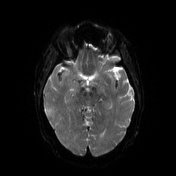
[im 55/96]
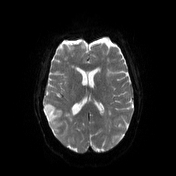
[im 68/96]
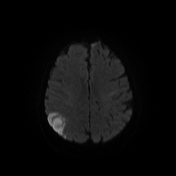
[im 82/96]
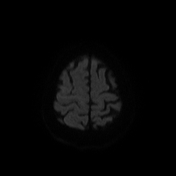
[im 96/96]
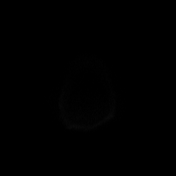

[Series 6: DWI · axial · 3.0mm · 1.36mm/px · z∈[-25,+112]mm · 4 of 48 slices shown (2 of 2)]
[im 1/48]
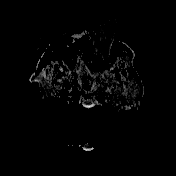
[im 16/48]
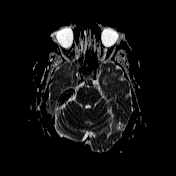
[im 32/48]
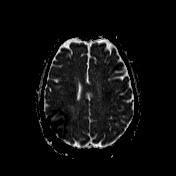
[im 48/48]
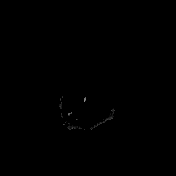

[Series 7: T1 · sagittal · 5.0mm · 0.75mm/px · 2 of 24 slices shown (1 of 2)]
[im 1/24]
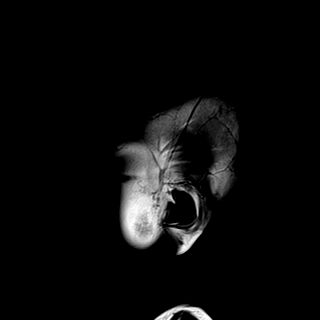
[im 24/24]
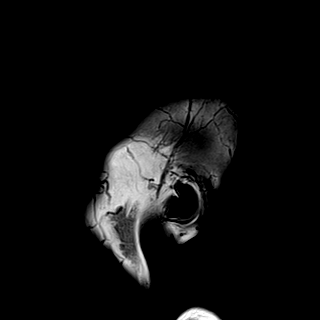

[Series 8: mip_images(sw) · axial · 24.0mm · 0.75mm/px · z∈[-23,+106]mm · 4 of 45 slices shown]
[im 1/45]
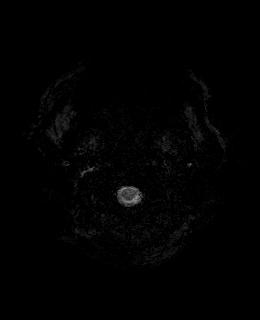
[im 15/45]
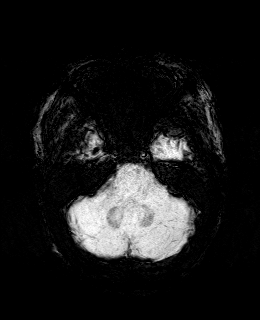
[im 30/45]
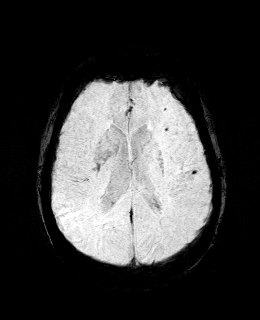
[im 45/45]
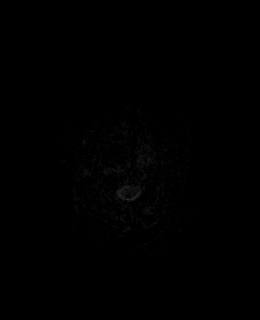

[Series 9: swi_images · axial · 3.0mm · 0.75mm/px · z∈[-33,+116]mm · 4 of 52 slices shown]
[im 1/52]
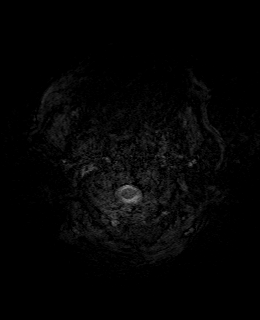
[im 18/52]
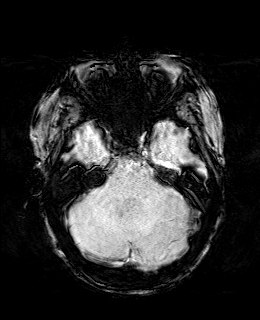
[im 35/52]
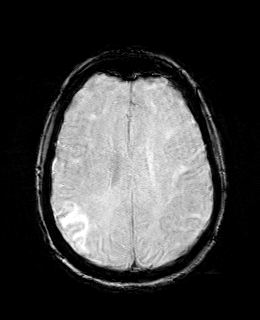
[im 52/52]
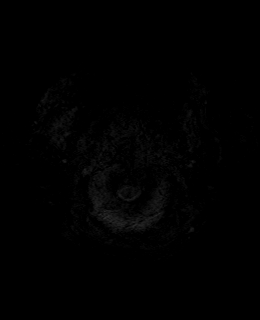

[Series 10: T2 · axial · 5.0mm · 0.62mm/px · z∈[-31,+115]mm · 2 of 24 slices shown (1 of 2)]
[im 1/24]
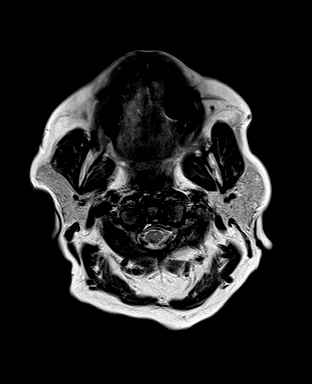
[im 24/24]
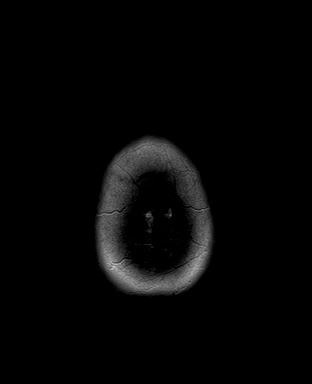

[Series 11: cor dwi_tracew · coronal · 5.0mm · 1.53mm/px · 4 of 50 slices shown]
[im 1/50]
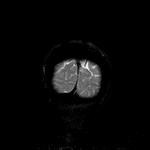
[im 17/50]
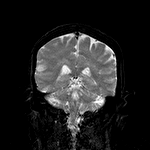
[im 33/50]
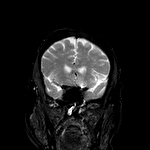
[im 50/50]
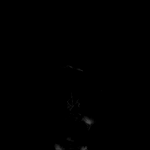

[Series 12: cor dwi_adc · coronal · 5.0mm · 1.53mm/px · 2 of 25 slices shown]
[im 1/25]
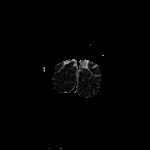
[im 25/25]
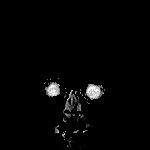

[Series 13: FLAIR · axial · 3.0mm · 0.75mm/px · z∈[-28,+112]mm · 4 of 49 slices shown]
[im 1/49]
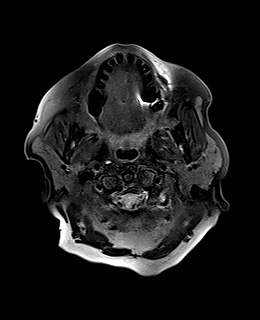
[im 17/49]
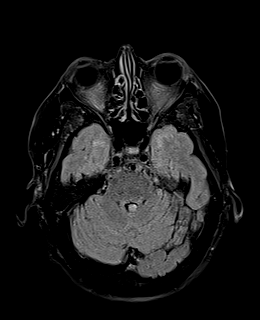
[im 33/49]
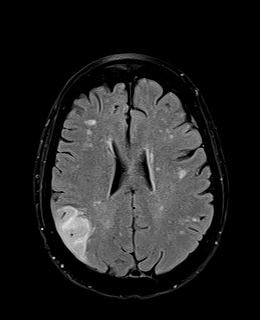
[im 49/49]
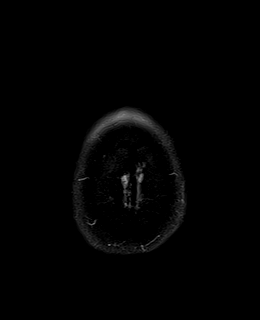

[Series 14: T1 · axial · 1.0mm · 0.94mm/px · z∈[-27,+112]mm · 12 of 144 slices shown (2 of 2)]
[im 1/144]
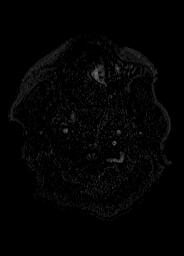
[im 14/144]
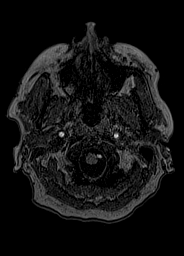
[im 27/144]
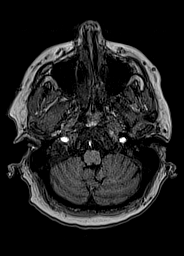
[im 40/144]
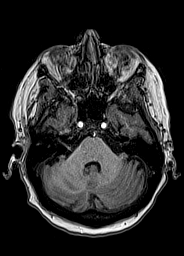
[im 53/144]
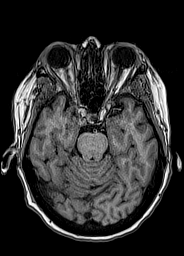
[im 66/144]
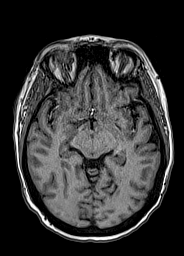
[im 79/144]
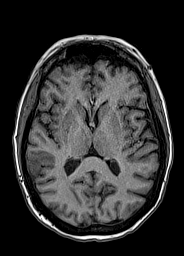
[im 92/144]
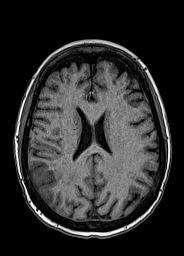
[im 105/144]
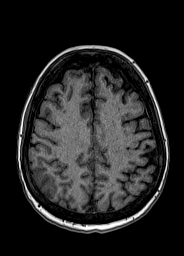
[im 118/144]
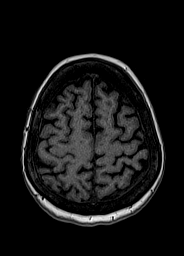
[im 131/144]
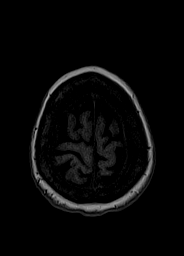
[im 144/144]
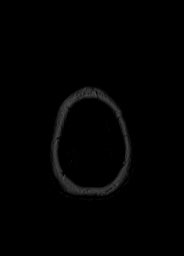

[Series 15: T2 · coronal · 5.0mm · 0.69mm/px · 2 of 30 slices shown (2 of 2)]
[im 1/30]
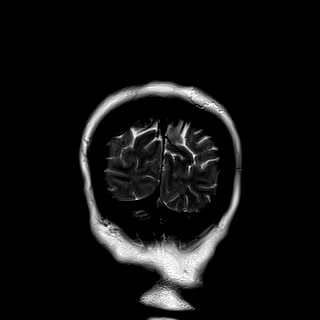
[im 30/30]
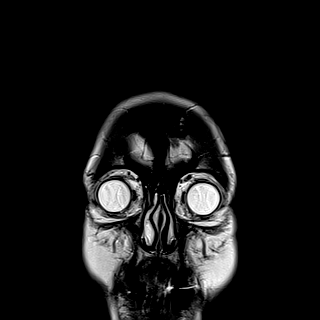

[48 of 48 positions shown; findings below may reference images not displayed]

FINDINGS: Brain: There is cortical/subcortical restricted diffusion in the
right parietal lobe extending into the posterior temporal lobe.
Small area of involvement is present in the left frontal lobe near
the operculum. Punctate foci are also present within the left
parietal lobe and right occipital lobe.

There is no intracranial mass or significant mass effect. There is
no hydrocephalus or extra-axial fluid collection. Ventricles and
sulci are normal in size and configuration. Patchy foci of T2
hyperintensity in the supratentorial white matter are nonspecific
but may reflect mild chronic microvascular ischemic changes. A few
scattered foci of subcortical susceptibility hypointensity likely
reflect chronic microhemorrhages

Vascular: Major vessel flow voids at the skull base are preserved.

Skull and upper cervical spine: Decreased T1 marrow signal likely
related to known anemia.

Sinuses/Orbits: Mild mucosal thickening. Bilateral lens
replacements.

Other: Sella is unremarkable.  Mastoid air cells are clear.
IMPRESSION: Acute infarcts of the right parietotemporal lobes greater than left
frontal lobe. Additional punctate left parietal and right occipital
acute infarcts.

Mild chronic microvascular ischemic changes. Few chronic
microhemorrhages.

These results will be called to the ordering clinician or
representative by the Radiologist Assistant, and communication
documented in the PACS or [REDACTED].

## 2023-03-02 NOTE — Telephone Encounter (Signed)
LMVM for pt that returning call again.

## 2023-03-03 NOTE — Telephone Encounter (Signed)
I called pt and relayed that returning call back. She is to call back if needed.

## 2023-03-14 ENCOUNTER — Ambulatory Visit (INDEPENDENT_AMBULATORY_CARE_PROVIDER_SITE_OTHER): Payer: Medicare HMO

## 2023-03-14 ENCOUNTER — Ambulatory Visit: Payer: Medicare HMO | Admitting: Podiatry

## 2023-03-14 ENCOUNTER — Encounter: Payer: Self-pay | Admitting: Podiatry

## 2023-03-14 VITALS — BP 136/70 | HR 87 | Ht 65.0 in | Wt 166.0 lb

## 2023-03-14 DIAGNOSIS — M2042 Other hammer toe(s) (acquired), left foot: Secondary | ICD-10-CM

## 2023-03-15 NOTE — Progress Notes (Signed)
Subjective:   Patient ID: [name], female   DOB: 77 y.o.   MRN: [id number]   HPI Patient presents concerned that the second toe is still sore and that it has abandon it.  Overall doing well with surgery but is concerned about this digit   ROS      Objective:  Physical Exam  Neurovascular status intact negative Denna Haggard' sign noted approximate 4 months after having surgery by Dr. Lilian Kapur for arthritis first MPJ hammertoe second and third digits with history of fixation     Assessment:  Inflammatory changes second digit left with no proximal edema erythema noted     Plan:  Possibility there could be a delayed union of the fusion site across the proximal joint surface versus just inflammatory process.  At this point we will give it several more months and if it is not improved could consider injection may ultimately require rebreaking that joint and refixating  X-rays indicate there is some suspicion around the inner phalangeal joint that there may be a delayed union or possible nonunion but does need a longer period of time to heal before that could be considered.  Everything else looks good as far as correction left foot with fixation

## 2023-04-13 NOTE — Telephone Encounter (Signed)
Contacted pt, LVM rq call back  

## 2023-04-13 NOTE — Telephone Encounter (Signed)
Pt called stated she is following up on testing that her and Aundra Millet talked about.

## 2023-04-14 NOTE — Telephone Encounter (Signed)
Per note with MM in Nov 2023, "Discussed that we could do further work-up for nocturnal leg cramps however the patient plans to increase her water intake first to see if she notices benefit. "   Pt is now having more trouble with her legs and toes being numb and hurting.  Did NCS 08/2020. Do you have any further recommendations ?

## 2023-04-20 NOTE — Telephone Encounter (Signed)
I sent a note to Dr Lucia Gaskins on 7/25 stating the following. Pt has reached out again. Can you please review and and advise.  Per note with MM in Nov 2023, "Discussed that we could do further work-up for nocturnal leg cramps however the patient plans to increase her water intake first to see if she notices benefit. "   Pt is now having more trouble with her legs and toes being numb and hurting.  Did NCS 08/2020. Do you have any further recommendations ?

## 2023-04-21 NOTE — Telephone Encounter (Signed)
Please offer in office visit to discuss and for me to assess. Ok to schedule with me or MD- first available

## 2023-04-22 ENCOUNTER — Telehealth: Payer: Self-pay | Admitting: Adult Health

## 2023-04-22 NOTE — Telephone Encounter (Signed)
Called pt back and documented in recent encounter.

## 2023-04-22 NOTE — Telephone Encounter (Signed)
Pt called stating that she had spoke to a nurse regarding her pain she is feeling in her L leg that starts at the feet and it goes all the way up to her hip. Pt states that she has not heard from anyone since she called last week. Pt would like a call back to see if the MD has been talked to about her pain situation.

## 2023-04-22 NOTE — Telephone Encounter (Signed)
The patient called this morning for an update.  I called the patient back and advised that Aundra Millet recommends the patient come into the office for an evaluation either with her or MD, soonest available.  The patient stated that she has an appointment with Aundra Millet already on September 18.  I do not see anything sooner at the moment with either her or Dr. Lucia Gaskins.  The patient asked if she needed an EMG.  She states she had briefly spoken with Aundra Millet about this at her last visit but had not heard anything after.  She states her primary care is aware but was not sure what would be causing the pain but did not believe it was cancerous.  She will be seeing him again before her next visit.  I did encourage her to touch base with him again, consider ruling out blood clot etc. in the meantime.  Patient states the pain was there at her last visit here but has gotten worse.  It is going from her thigh up into her hip and surgical site.  The surgical site is located on the inside of her left leg above her knee. She has a previous cancerous mole removal but did not need any other intervention. I added her to our wait list. She was appreciative.

## 2023-04-25 NOTE — Telephone Encounter (Signed)
I would have her see Aundra Millet on the 18th, in megan's notes patient stated that she has sharp pain that comes up the inside of the right leg on the inner thigh and the outer thigh of the left leg, are we evaluating for lumbar radic or peripheral neuropathy? Have her be seen then Aundra Millet can order what she wants [place] to evaluate thanks

## 2023-04-28 ENCOUNTER — Ambulatory Visit: Payer: Medicare HMO | Admitting: Adult Health

## 2023-04-28 ENCOUNTER — Encounter: Payer: Self-pay | Admitting: Adult Health

## 2023-04-28 VITALS — BP 126/74 | HR 57 | Ht 65.0 in | Wt 164.0 lb

## 2023-04-28 DIAGNOSIS — M5432 Sciatica, left side: Secondary | ICD-10-CM | POA: Diagnosis not present

## 2023-04-28 DIAGNOSIS — G629 Polyneuropathy, unspecified: Secondary | ICD-10-CM

## 2023-04-28 NOTE — Progress Notes (Addendum)
PATIENT: [name] DOB: 1946-07-08  REASON FOR VISIT: follow up HISTORY FROM: patient PRIMARY NEUROLOGIST: Dr. Lucia Gaskins   Chief Complaint  Patient presents with   Follow-up    Pt in 18  Pt here for leg pain Pt states Numbness,tingling , burning, and pain in both legs Pt states severe pain inside both legs  and both thighs      HISTORY OF PRESENT ILLNESS: Today 04/28/23:  [name] is a 77 y.o. female with a history of neuropathy. Returns today for follow-up.  She comes back today for sooner visit.  States that she is having numbness tingling burning and pain in both legs.  Reports that she has severe pain in the inside thigh  of both legs.  Reports that she will get a sharp pain starting in the hip radiating down the outside of the left leg.  She states the pain stays there.  Denies any significant back pain unless she stays bent over for a significant period of time.  She reports that she has had previousnjury on the left foot which affects the way she ambulates.  Denies any recent falls.  Continues to take gabapentin 1 to 2 tablets 4 times a day.  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   12/03/22:   [name] is a 77 y.o. female with a history of neuropathy. Returns today for follow-up.  She reports that she has spaced out her gabapentin instead of taking 2 tablets 4 times a day she takes 1 tablet every 2 hours for total of 8 times a day.  The total amount of gabapentin has remained the same she is just taking it more frequently.  She reports that this is working better for her symptoms.  Reports no recent falls.  She did have surgery on her foot.  Returns today for an evaluation.     11/03/22: [name] is a 77 y.o. female with a history of neuropathy. Returns today for follow-up. Reports burning pain is in the feet. But will have sharp pain that comes up the inside of the right leg on the inner thigh and the  outer thigh of the left leg. No significant changes with gait or balance. No falls. Continues on gabapentin 2 tablets four times a day. Sometimes will not take full dose if she is not having pain.    Will be having surgery on left foot in march d/t hammer toes.  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  08/10/22: [name] is a 77 y.o. female who has been followed in this office for neuropathy. Returns today for follow-up. Continues on gabapentin. Working well for neuropathy. On occasion she gets pain that radiates up the inside of both legs more so on the right. Toes are cold. Will wake up nightly with muscle cramps. Doesn't feel like she drinks a lot of water at night. Also gets muscle cramps in the lower back that radiates up the back and into the chest. Uses robaxin and ativan and that relieves symptoms.  Robaxin does not seem to help with muscle cramps during the night.  She returns today for an evaluation.    REVIEW OF SYSTEMS: Out of a complete 14 system review of symptoms, the patient complains only of the following symptoms, and all other reviewed systems are negative.  ALLERGIES: Allergies  Allergen Reactions   Oxycodone Other (See Comments)  Hallucinations    Aspirin Other (See Comments)    Interferes with headaches  Other Reaction(s): stomach issues   Imipenem Other (See Comments)    Warm and tingly all over   Methocarbamol Rash   Statins Other (See Comments)    leg pain, muscle cramps   Losartan Other (See Comments)    Pt unsure   Cephalexin Other (See Comments)    Stomach cramps   Clindamycin/Lincomycin Nausea And Vomiting and Rash   Contrast Media [Iodinated Contrast Media] Rash   Losartan Potassium-Hctz Other (See Comments)    Unknown Reaction - pt is unaware of this  Other Reaction(s): do not remember   Methylprednisolone Rash    Rash on face   Penicillins Rash    Did it involve swelling of the face/tongue/throat, SOB, or  low BP? No Did it involve sudden or severe rash/hives, skin peeling, or any reaction on the inside of your mouth or nose? Yes Did you need to seek medical attention at a hospital or doctor's office? No When did it last happen?      2011 If all above answers are "NO", may proceed with cephalosporin use.    Quinolones Rash    HOME MEDICATIONS: Outpatient Medications Prior to Visit  Medication Sig Dispense Refill   acetaminophen (TYLENOL) 500 MG tablet Take 1,000 mg by mouth every 4 (four) hours as needed for headache.     Calcium Carbonate+Vitamin D 600-200 MG-UNIT TABS Take 1 tablet by mouth 2 (two) times daily.     Carboxymethylcellulose Sod PF (EQ RESTORE PLUS LUBRICANT EYE) 0.5 % SOLN Place 1 drop into both eyes daily as needed (Dry eye).     clopidogrel (PLAVIX) 75 MG tablet Take 75 mg by mouth daily.     fluconazole (DIFLUCAN) 150 MG tablet Take 150 mg by mouth daily. For rash around mouth started on 12-15-2022     folic acid (FOLVITE) 1 MG tablet Take 2 tablets (2 mg total) by mouth daily. (Patient taking differently: Take 1 mg by mouth daily.)     gabapentin (NEURONTIN) 300 MG capsule TAKE 1 TO 2 CAPSULES FOUR TIMES DAILY AS NEEDED (Patient taking differently: Take 300 mg by mouth See admin instructions. Take 300 mg eight times a day) 720 capsule 3   HYDROcodone-acetaminophen (NORCO/VICODIN) 5-325 MG tablet Take 1 tablet by mouth 2 (two) times daily.     hydrocortisone 2.5 % cream Apply 1 Application topically in the morning. On face     levothyroxine (SYNTHROID) 112 MCG tablet Take 112 mcg by mouth daily before breakfast.     LORazepam (ATIVAN) 2 MG tablet Take 2 mg by mouth 3 (three) times daily as needed for anxiety.     MELATONIN PO Take 5 mg by mouth daily as needed.     methocarbamol (ROBAXIN) 750 MG tablet Take 750 mg by mouth 2 (two) times daily as needed for muscle spasms.     methylPREDNISolone (MEDROL) 4 MG tablet Take 8 mg by mouth 2 (two) times daily.     Multiple  Vitamins-Minerals (MULTIVITAMIN WITH MINERALS) tablet Take 1 tablet by mouth daily. Complete     mupirocin ointment (BACTROBAN) 2 % Apply 1 Application topically every evening.     nystatin (MYCOSTATIN/NYSTOP) powder Apply 1 application topically 3 (three) times daily as needed (irritation).     omeprazole (PRILOSEC) 20 MG capsule Take 20 mg by mouth as needed.     polycarbophil (FIBERCON) 625 MG tablet Take 625 mg by mouth in the morning  and at bedtime.     sertraline (ZOLOFT) 50 MG tablet Take 50 mg by mouth daily.     triamcinolone cream (KENALOG) 0.1 % 1 application daily as needed (Vaginal irritations).     No facility-administered medications prior to visit.    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   Depression    DVT (deep venous thrombosis) (HCC)    acute right peroneal DVT & age indeterminant left popliteal DVT 4//1922, s/p Xarelto x 6 months   Fungal infection    around lips started 12-15-2022 on fluconazole for   GERD (gastroesophageal reflux disease)    Hypothyroidism    melanoma right upper thigh 2001   MIGRAINE HEADACHE    Neuromuscular disorder (HCC)    condition where muscle separates from bone left chest, quarter size, pain intermittent   Neuropathy    feet   PFO (patent foramen ovale)    closure 09/17/21   Sleep apnea    does not use CPAP   Stress incontinence    Stroke (HCC) 01/05/2021   Wears glasses     PAST SURGICAL HISTORY: Past Surgical History:  Procedure Laterality Date   BALLOON DILATION N/A 09/18/2020   Procedure: BALLOON DILATION;  Surgeon: Kerin Salen, MD;  Location: [facility];  Service: Gastroenterology;  Laterality: N/A;   bilateral bunion removal     yrs ago   BUBBLE STUDY  07/03/2021   Procedure: BUBBLE STUDY;  Surgeon: Lewayne Bunting, MD;  Location: [facility] END[facility]COPY;  Service: Cardiovascular;;   CYSTOSCOPY WITH INJECTION N/A 01/11/2023    Procedure: CYSTOSCOPY WITH INJECTION OF Mariam Dollar;  Surgeon: Noel Christmas, MD;  Location: [facility];  Service: Urology;  Laterality: N/A;  45 MINS   DRUG INDUCED ENDOSCOPY Bilateral 07/28/2022   Procedure: DRUG INDUCED ENDOSCOPY;  Surgeon: Christia Reading, MD;  Location: [facility];  Service: ENT;  Laterality: Bilateral;   ESOPHAGOGASTRODUODENOSCOPY (EGD) WITH PROPOFOL N/A 09/18/2020   Procedure: ESOPHAGOGASTRODUODENOSCOPY (EGD) WITH PROPOFOL;  Surgeon: Kerin Salen, MD;  Location: [facility];  Service: Gastroenterology;  Laterality: N/A;   EYE SURGERY Bilateral    Cataract L eye 1-20, R eye 3-20    FOOT ARTHRODESIS Left 11/19/2022   Procedure: ARTHRODESIS FOOT WITH BONE GRAFT;  Surgeon: Edwin Cap, DPM;  Location: [facility];  Service: Podiatry;  Laterality: Left;   FOREIGN BODY REMOVAL  09/18/2020   Procedure: FOREIGN BODY REMOVAL;  Surgeon: Kerin Salen, MD;  Location: [facility];  Service: Gastroenterology;;   Steward Drone FUSION Left 11/19/2022   Procedure: HALLUX METATARSAL PHALANGEAL JOINT FUSION;  Surgeon: Edwin Cap, DPM;  Location: [facility];  Service: Podiatry;  Laterality: Left;   HAMMER TOE SURGERY Left 11/19/2022   Procedure: HAMMER TOE CORRECTION SECOND/THIRD;  Surgeon: Edwin Cap, DPM;  Location: [facility];  Service: Podiatry;  Laterality: Left;   METATARSAL OSTEOTOMY Left 11/19/2022   Procedure: METATARSAL OSTEOTOMY;  Surgeon: Edwin Cap, DPM;  Location: [facility];  Service: Podiatry;  Laterality: Left;  BLOCK   MOHS SURGERY  2001   upper right thigh   PATENT FORAMEN OVALE(PFO) CLOSURE N/A 08/28/2021   Procedure: PATENT FORAMEN OVALE (PFO) CLOSURE;  Surgeon: Tonny Bollman, MD;  [facility];  Service: Cardiovascular;  Laterality: N/A;   REVERSE SHOULDER ARTHROPLASTY Right 03/16/2019   Procedure: REVERSE SHOULDER ARTHROPLASTY;  Surgeon: Beverely Low, MD;  Location: [facility];  Service: Orthopedics;  Laterality: Right;   interscalene block   TEE WITHOUT CARDIOVERSION N/A 07/03/2021   Procedure: TRANSESOPHAGEAL ECHOCARDIOGRAM (TEE);  Surgeon: Lewayne Bunting, MD;  Location: [facility] ENDOSCOPY;  Service: Cardiovascular;  Laterality: N/A;   TUBAL LIGATION     yrs ago    FAMILY HISTORY: Family History  Problem Relation Age of Onset   Heart disease Mother    Stroke Father    Arthritis Sister    Diabetes Brother    Arthritis Brother    Neuropathy Brother    Neuropathy Sister    Diabetes Brother     SOCIAL HISTORY: Social History   Socioeconomic History   Marital status: Married    Spouse name: Not on file   Number of children: Not on file   Years of education: Not on file   Highest education level: Not on file  Occupational History   Not on file  Tobacco Use   Smoking status: Former    Current packs/day: 0.00    Types: Cigarettes    Quit date: 1969    Years since quitting: 55.6   Smokeless tobacco: Never  Vaping Use   Vaping status: Never Used  Substance and Sexual Activity   Alcohol use: No   Drug use: No   Sexual activity: Not Currently    Birth control/protection: Post-menopausal  Other Topics Concern   Not on file  Social History Narrative   Lives at home with husband (has mild Dementia) of 55 years   Right handed    Caffeine: 1 cup tea/day   Social Determinants of Health   Financial Resource Strain: Not on file  Food Insecurity: Not on file  Transportation Needs: Not on file  Physical Activity: Not on file  Stress: Not on file  Social Connections: Not on file  Intimate Partner Violence: Not on file      PHYSICAL EXAM  Vitals:   04/28/23 1015  BP: 126/74  Pulse: (!) 57  Weight: 164 lb (74.4 kg)  Height: 5\' 5"  (1.651 m)   Body mass index is 27.29 kg/m.  Generalized: Well developed, in no acute distress   Neurological examination  Mentation: Alert oriented to time, place, history taking. Follows all commands speech and language fluent Cranial nerve II-XII:  Pupils were equal round reactive to light. Extraocular movements were full, visual field were full on confrontational test. Facial sensation and strength were normal.. Head turning and shoulder shrug  were normal and symmetric. Motor: The motor testing reveals 5 over 5 strength of all 4 extremities. Good symmetric motor tone is noted throughout.  No pain with palpation of the left buttocks. Sensory: Sensory testing is intact to soft touch on all 4 extremities.  Pinprick and vibration sensation decreased in a stocking-like pattern in both lower extremities.  No evidence of extinction is noted.  Coordination: Cerebellar testing reveals good finger-nose-finger and heel-to-shin bilaterally.  Gait and station: Gait is normal.  Unable to walk on her toes or heel due to previous foot injury   DIAGNOSTIC DATA (LABS, IMAGING, TESTING) - I reviewed patient records, labs, notes, testing and imaging myself where available.  Lab Results  Component Value Date   WBC 9.0 11/19/2022   HGB 14.3 01/11/2023   HCT 42.0 01/11/2023   MCV 93.7 11/19/2022   PLT 399 11/19/2022      Component Value Date/Time   NA 140 01/11/2023 0805   NA 139 02/24/2022 1001   K 4.4 01/11/2023 0805   CL 108 01/11/2023  0805   CO2 22 11/19/2022 1315   GLUCOSE 102 (H) 01/11/2023 0805   BUN 10 01/11/2023 0805   BUN 7 (L) 02/24/2022 1001   CREATININE 0.50 01/11/2023 0805   CREATININE 0.65 06/17/2021 0955   CALCIUM 9.5 11/19/2022 1315   PROT 6.4 02/24/2022 1001   ALBUMIN 4.2 06/17/2021 0955   ALBUMIN 4.5 08/21/2020 1519   AST 16 06/17/2021 0955   ALT 15 06/17/2021 0955   ALKPHOS 44 06/17/2021 0955   BILITOT 0.5 06/17/2021 0955   GFRNONAA >60 11/19/2022 1315   GFRNONAA >60 06/17/2021 0955   GFRAA 111 08/21/2020 1519   Lab Results  Component Value Date   CHOL 158 01/05/2021   HDL 44 01/05/2021   LDLCALC 100 (H) 01/05/2021   TRIG 70 01/05/2021   CHOLHDL 3.6 01/05/2021   Lab Results  Component Value Date   HGBA1C 5.8  (H) 02/24/2022   Lab Results  Component Value Date   VITAMINB12 710 02/24/2022   Lab Results  Component Value Date   TSH 0.902 03/05/2021      ASSESSMENT AND PLAN 78 y.o. year old female  has a past medical history of Anxiety, Arthritis, Autoimmune hemolytic anemia (HCC) (12/2020), Depression, DVT (deep venous thrombosis) (HCC), Fungal infection, GERD (gastroesophageal reflux disease), Hypothyroidism, melanoma right upper thigh (2001), MIGRAINE HEADACHE, Neuromuscular disorder (HCC), Neuropathy, PFO (patent foramen ovale), Sleep apnea, Stress incontinence, Stroke (HCC) (01/05/2021), and Wears glasses. here with:  1.  Neuropathy 2.  Left-sided sciatica?  -Patient will continue gabapentin 300-600 mg 4 times a day.  Advised that she should try to take her medication consistently to avoid breakthrough symptoms -Will repeat nerve conduction studies with EMG due to worsening/new symptoms -Follow-up in 6 months or sooner if needed      Butch Penny, MSN, NP-C 04/28/2023, 10:26 AM [facility] [address], [address] [place], [place] [address] [phone]

## 2023-05-03 ENCOUNTER — Ambulatory Visit: Payer: Medicare HMO | Admitting: Podiatry

## 2023-05-04 ENCOUNTER — Telehealth: Payer: Self-pay | Admitting: Cardiovascular Disease

## 2023-05-04 NOTE — Telephone Encounter (Signed)
Patient reports intermittent pain in center of chest over the last few weeks. She states it starts in her right ear and radiates to center of chest. She reports this happened twice in 1 day this week. The episodes last for 10-15 minutes before disappearing. At onset of episode she will take a muscle relaxer and antacid, breathe slowly and symptoms go away.  She saw her PCP today for her physical and reports symptoms, per her PCP he believes she may be experiencing costochondritis but to reach out and discuss with cardiologist.  Will forward to Dr. Clifton James and his nurse to review and advise.

## 2023-05-04 NOTE — Telephone Encounter (Signed)
Pt c/o of Chest Pain: STAT if CP now or developed within 24 hours  1. Are you having CP right now? No   2. Are you experiencing any other symptoms (ex. SOB, nausea, vomiting, sweating)? No   3. How long have you been experiencing CP? For the past week   4. Is your CP continuous or coming and going? Coming and going   5. Have you taken Nitroglycerin? No   Patient states that she was told by PCP that this could be a pulled muscle, but thought she may need to call and discuss this with her cardiologist.   Patient states it starts in right ear, and goes down into the center of her chest where her chest bone is. States it last 10 to 15 mins and goes away. She has been taking a muscle relaxer for this and antiacid.  ?

## 2023-05-05 ENCOUNTER — Ambulatory Visit: Payer: Self-pay | Admitting: Neurology

## 2023-05-05 ENCOUNTER — Telehealth: Payer: Self-pay | Admitting: Adult Health

## 2023-05-05 ENCOUNTER — Ambulatory Visit (INDEPENDENT_AMBULATORY_CARE_PROVIDER_SITE_OTHER): Payer: Medicare HMO | Admitting: Neurology

## 2023-05-05 DIAGNOSIS — Z0289 Encounter for other administrative examinations: Secondary | ICD-10-CM

## 2023-05-05 DIAGNOSIS — G629 Polyneuropathy, unspecified: Secondary | ICD-10-CM

## 2023-05-05 DIAGNOSIS — G8929 Other chronic pain: Secondary | ICD-10-CM

## 2023-05-05 DIAGNOSIS — R200 Anesthesia of skin: Secondary | ICD-10-CM

## 2023-05-05 DIAGNOSIS — R29898 Other symptoms and signs involving the musculoskeletal system: Secondary | ICD-10-CM

## 2023-05-05 DIAGNOSIS — M5416 Radiculopathy, lumbar region: Secondary | ICD-10-CM

## 2023-05-05 NOTE — Progress Notes (Addendum)
Full Name: [name] Gender: Female MRN #: [id number] Date of Birth: 02-Jan-1946    Visit Date: 05/05/2023 09:03 Age: 77 Years Examining Physician: Dr. 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 "L4-5: disc bulging and facet hypertrophy with mild spinal stenosis and severe biforaminal stenosis " see imaging  History: Patient reports pain in left lower extremity that goes down buttocks to the back of the thigh to the side and back leg. Also has concomitant polyneuropathy. Can't move left big toe (surgical).   Summary: NCS were performed on the bilateral lower extremities: The left peroneal EDB motor conduction showed reduced amplitude (1.6 mV, normal greater than 2) and decreased conduction velocity (fib head to ankle, 39 m/s, normal greater than 44).  The right peroneal EDB motor conduction showed reduced amplitude (1.9 mV, normal greater than 2).  The left tibial motor nerve conduction showed reduced amplitude (3.1 mV, normal greater than 4).  The right tibial motor nerve conduction showed reduced amplitude (3.9 mV, normal greater than 4).  The left sural sensory conduction was within normal limits.  The right sural sensory conduction showed mildly reduced amplitude (5 V, normal greater than 6).  The left superficial peroneal sensory conduction was within normal limits.  The right superficial peroneal sensory conduction showed mildly reduced amplitude (4 V, normal greater than 6). EMG needle study was performed on the bilateral lower extremities: The left tibialis anterior showed polyphasic motor units and diminished motor unit recruitment.  The left gastrocnemius showed spontaneous activity, increased motor unit amplitude, polyphasic motor units and diminished motor unit recruitment.  The left biceps femoris long head showed spontaneous  activity.  The lower left lumbar paraspinal muscles showed spontaneous activity.  The lower right lumbar paraspinal muscles showed spontaneous activity.  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 "L4-5: disc bulging and facet hypertrophy with mild spinal stenosis and severe biforaminal stenosis " see imaging  EMG needle study showed acute/ongoing denervation and chronic neurogenic changes in the bilateral lower lumbar paraspinals as well as left leg muscles that share L5, S1 innervation consistent with lumbar radiculopathy.  MRI of the lumbar spine recommended.  Nerve conduction studies showed minimal decrease in amplitude of 2 sensory nerves suggestive of concomitant mild sensory, axonal large-fiber polyneuropathy. Orders Placed This Encounter  Procedures   MR LUMBAR SPINE WO CONTRAST     ------------------------------- Naomie Dean, M.D.  [facility] [address] [place], [place] [address] Tel: [phone] Fax: [phone]  Verbal informed consent was obtained from the patient, patient was informed of potential risk of procedure, including bruising, bleeding, hematoma formation, infection, muscle weakness, muscle pain, numbness, among others.        MNC    Nerve / Sites Muscle Latency Ref. Amplitude Ref. Rel Amp Segments Distance Velocity Ref. Area    ms ms mV mV %  cm m/s m/s mVms  L Peroneal - EDB     Ankle EDB 5.4 <=6.5 1.6 >=2.0 100 Ankle - EDB 9   4.6     Fib head EDB 12.6  1.1  71 Fib head - Ankle 28 39 >=44 3.5     Pop fossa EDB 14.0  1.3  114 Pop fossa - Fib head 9 65 >=44 5.2  Pop fossa - Ankle      R Peroneal - EDB     Ankle EDB 4.8 <=6.5 1.9 >=2.0 100 Ankle - EDB 9   6.8     Fib head EDB 10.8  1.9  98.3 Fib head - Ankle 27 44 >=44 6.9     Pop fossa EDB 13.1  1.9  101 Pop fossa - Fib head 10 45 >=44 7.2         Pop fossa - Ankle      L Tibial - AH      Ankle AH 4.6 <=5.8 3.1 >=4.0 100 Ankle - AH 9   7.5     Pop fossa AH 11.9  1.3  41.3 Pop fossa - Ankle 37 50 >=41 2.3  R Tibial - AH     Ankle AH 4.6 <=5.8 3.9 >=4.0 100 Ankle - AH 9   8.6     Pop fossa AH 13.5  2.5  64.6 Pop fossa - Ankle 38.6 43 >=41 7.4             SNC    Nerve / Sites Rec. Site Peak Lat Ref.  Amp Ref. Segments Distance    ms ms V V  cm  L Sural - Ankle (Calf)     Calf Ankle 3.6 <=4.4 7 >=6 Calf - Ankle 14  R Sural - Ankle (Calf)     Calf Ankle 3.1 <=4.4 5 >=6 Calf - Ankle 14  L Superficial peroneal - Ankle     Lat leg Ankle 2.6 <=4.4 14 >=6 Lat leg - Ankle 14  R Superficial peroneal - Ankle     Lat leg Ankle 4.3 <=4.4 4 >=6 Lat leg - Ankle 14             F  Wave    Nerve F Lat Ref.   ms ms  L Tibial - AH 33.0 <=56.0  R Tibial - AH 53.4 <=56.0         EMG Summary Table    Spontaneous MUAP Recruitment  Muscle IA Fib PSW Fasc Other Amp Dur. Poly Pattern  L. Vastus medialis Normal None None None _______ Normal Normal Normal Normal  L. Tibialis anterior Normal None None None _______ Normal Normal 1+ Reduced  L. Gastrocnemius (Medial head) Normal None 2+ None _______ Increased Normal 3+ Reduced  L. Extensor hallucis longus Normal None None None _______      L. Abductor hallucis Normal None None None _______ Normal Normal Normal Normal  L. Biceps femoris (long head) Normal None 3+ None _______ Normal Normal Normal Normal  L. Lumbar paraspinals (low) Normal None 2+ None _______ Normal Normal Normal Normal  L. Gluteus maximus Normal None None None _______ Normal Normal Normal Normal  L. Gluteus medius Normal None None None _______ Normal Normal Normal Normal  R. Lumbar paraspinals (low) Normal None 3+ None _______ Normal Normal Normal Normal  R. Iliopsoas Normal None None None _______ Normal Normal Normal Normal  R. Adductor longus Normal None None None _______ Normal Normal Normal Normal  R. Tibialis anterior Normal None None None _______ Normal Normal  Normal Normal  R. Vastus medialis Normal None None None _______ Normal Normal Normal Normal  R. Gastrocnemius (Medial head) Normal None None None _______ Normal Normal Normal Normal  R. Vastus lateralis Normal None None None _______ Normal Normal Normal Normal

## 2023-05-05 NOTE — Telephone Encounter (Signed)
Spoke w patient.  She has only ever seen Dr. Katrinka Blazing as a work in visit and then saw Dr. Excell Seltzer for her PFO.  She has this ongoing pain "attacks" that when she is out in public, people think she is having a heart attack and call [phone] for her.  Her PCP diagnosed her w costochondritis.  Her regimen of a muscle relaxer and anxiety pill help this within about 15 minutes.  She still would like to follow up with cardiology to discuss further and she wishes to see Dr. Clifton James since it has been going on for about 2 years.  I have scheduled her for 07/06/23.  Pt agreeable to this plan.

## 2023-05-05 NOTE — Patient Instructions (Addendum)
Mri lumbar

## 2023-05-05 NOTE — Telephone Encounter (Signed)
Pt has called to provide her A1C results from her PCP, it was 6.0 if RN has questions pt can be called.

## 2023-05-06 NOTE — Telephone Encounter (Signed)
Noted  

## 2023-05-10 ENCOUNTER — Ambulatory Visit: Payer: Medicare HMO | Admitting: Podiatry

## 2023-05-10 ENCOUNTER — Ambulatory Visit (INDEPENDENT_AMBULATORY_CARE_PROVIDER_SITE_OTHER): Payer: Medicare HMO

## 2023-05-10 DIAGNOSIS — M2042 Other hammer toe(s) (acquired), left foot: Secondary | ICD-10-CM | POA: Diagnosis not present

## 2023-05-10 NOTE — Progress Notes (Signed)
  Subjective:  Patient ID: [name], female    DOB: [birth year],  MRN: [id number]  Chief Complaint  Patient presents with   Follow-up    6 month follow up for left hallux. Continues to have frequent sharp or dull pains to left hallux and 2nd left toe. Trouble walking due to left 2nd toe being hammertoe.     77 y.o. female presents with the above complaint. History confirmed with patient.  Overall is doing okay getting a little bit more motion, the tip of the second toe still quite painful and digs into the ground.  The top of the big toe feels irritated and also numb.  Objective:  Physical Exam: warm, good capillary refill, no trophic changes or ulcerative lesions, normal DP and PT pulses, normal sensory exam, and no pain in first MPJ fusion site, no edema, incision is not hypertrophic, she has edema in the second and third toes with some limited plantarflexion and dorsiflexion of the second and third MPJ.   Radiographs: Multiple views x-ray of the left foot: Good ration of fusion site of the first MTPJ, some remaining healing at the PIPJ fusion sites of 2 and 3 Assessment:   1. Hammertoe of left foot      Plan:  Patient was evaluated and treated and all questions answered.  Fusion site still remains well-healed, she does have residual mallet toe deformity at the tip of the left second toe.  We discussed further treatment options for this including flexor tenotomy.  Regarding the discomfort on the big toe we also discussed possible removal of the implants which could be irritating this as well.  She would like to proceed with flexor tenotomy.  Following consent, the second toe on the left foot was anesthetized with 1.5 cc each of Marcaine 0.5% plain and lidocaine 2% plain.  It was prepped with Betadine and a tourniquet secured around the base of the toe.  A percutaneous flexor tenotomy was completed utilizing a #11 blade with good reduction of the distal digital contracture.  It was  bandaged and splinted in position with Silvadene and sterile bandages and Coban.  Post care instructions were given.  Return in 1 month for follow-up visit to reevaluate position of the toe.  Return in about 1 month (around 06/10/2023) for 8/20 flexor tenotomy L 2nd post op (no x-rays).

## 2023-05-10 NOTE — Procedures (Addendum)
Full Name: [name] Gender: Female MRN #: [id number] Date of Birth: [birth year]    Visit Date: 05/05/2023 09:03 Age: 77 Years Examining Physician: Dr. 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 "L4-5: disc bulging and facet hypertrophy with mild spinal stenosis and severe biforaminal stenosis " see imaging History: Patient reports pain in left lower extremity that goes down buttocks to the back of the thigh to the side and back leg. Also has concomitant polyneuropathy. Can't move left big toe (surgical).   Summary: NCS were performed on the bilateral lower extremities: The left peroneal EDB motor conduction showed reduced amplitude (1.6 mV, normal greater than 2) and decreased conduction velocity (fib head to ankle, 39 m/s, normal greater than 44).  The right peroneal EDB motor conduction showed reduced amplitude (1.9 mV, normal greater than 2).  The left tibial motor nerve conduction showed reduced amplitude (3.1 mV, normal greater than 4).  The right tibial motor nerve conduction showed reduced amplitude (3.9 mV, normal greater than 4).  The left sural sensory conduction was within normal limits.  The right sural sensory conduction showed mildly reduced amplitude (5 V, normal greater than 6).  The left superficial peroneal sensory conduction was within normal limits.  The right superficial peroneal sensory conduction showed mildly reduced amplitude (4 V, normal greater than 6). EMG needle study was performed on the bilateral lower extremities: The left tibialis anterior showed polyphasic motor units and diminished motor unit recruitment.  The left gastrocnemius showed spontaneous activity, increased motor unit amplitude, polyphasic motor units and diminished motor unit recruitment.  The left biceps femoris long head showed spontaneous  activity.  The lower left lumbar paraspinal muscles showed spontaneous activity.  The lower right lumbar paraspinal muscles showed spontaneous activity.  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 "L4-5: disc bulging and facet hypertrophy with mild spinal stenosis and severe biforaminal stenosis " see imaging  Conclusion:  EMG needle study showed acute/ongoing denervation and chronic neurogenic changes in the bilateral lower lumbar paraspinals as well as left leg muscles that share L5, S1 innervation consistent with lumbar radiculopathy.  MRI of the lumbar spine recommended.  Nerve conduction studies showed minimal decrease in amplitude of 2 sensory nerves suggestive of concomitant mild sensory, axonal large-fiber polyneuropathy. Orders Placed This Encounter  Procedures   MR LUMBAR SPINE WO CONTRAST     ------------------------------- Naomie Dean, M.D.  [facility] [address], [address] [place], [place] [address] Tel: [phone] Fax: [phone]  Verbal informed consent was obtained from the patient, patient was informed of potential risk of procedure, including bruising, bleeding, hematoma formation, infection, muscle weakness, muscle pain, numbness, among others.        MNC    Nerve / Sites Muscle Latency Ref. Amplitude Ref. Rel Amp Segments Distance Velocity Ref. Area    ms ms mV mV %  cm m/s m/s mVms  L Peroneal - EDB     Ankle EDB 5.4 <=6.5 1.6 >=2.0 100 Ankle - EDB 9   4.6     Fib head EDB 12.6  1.1  71 Fib head - Ankle 28 39 >=44 3.5     Pop fossa EDB 14.0  1.3  114 Pop fossa - Fib head 9 65 >=44 5.2  Pop fossa - Ankle      R Peroneal - EDB     Ankle EDB 4.8 <=6.5 1.9 >=2.0 100 Ankle - EDB 9   6.8     Fib head EDB 10.8  1.9  98.3 Fib head - Ankle 27 44 >=44 6.9     Pop fossa EDB 13.1  1.9  101 Pop fossa - Fib head 10 45 >=44 7.2         Pop fossa - Ankle      L Tibial - AH      Ankle AH 4.6 <=5.8 3.1 >=4.0 100 Ankle - AH 9   7.5     Pop fossa AH 11.9  1.3  41.3 Pop fossa - Ankle 37 50 >=41 2.3  R Tibial - AH     Ankle AH 4.6 <=5.8 3.9 >=4.0 100 Ankle - AH 9   8.6     Pop fossa AH 13.5  2.5  64.6 Pop fossa - Ankle 38.6 43 >=41 7.4             SNC    Nerve / Sites Rec. Site Peak Lat Ref.  Amp Ref. Segments Distance    ms ms V V  cm  L Sural - Ankle (Calf)     Calf Ankle 3.6 <=4.4 7 >=6 Calf - Ankle 14  R Sural - Ankle (Calf)     Calf Ankle 3.1 <=4.4 5 >=6 Calf - Ankle 14  L Superficial peroneal - Ankle     Lat leg Ankle 2.6 <=4.4 14 >=6 Lat leg - Ankle 14  R Superficial peroneal - Ankle     Lat leg Ankle 4.3 <=4.4 4 >=6 Lat leg - Ankle 14             F  Wave    Nerve F Lat Ref.   ms ms  L Tibial - AH 33.0 <=56.0  R Tibial - AH 53.4 <=56.0         EMG Summary Table    Spontaneous MUAP Recruitment  Muscle IA Fib PSW Fasc Other Amp Dur. Poly Pattern  L. Vastus medialis Normal None None None _______ Normal Normal Normal Normal  L. Tibialis anterior Normal None None None _______ Normal Normal 1+ Reduced  L. Gastrocnemius (Medial head) Normal None 2+ None _______ Increased Normal 3+ Reduced  L. Extensor hallucis longus Normal None None None _______      L. Abductor hallucis Normal None None None _______ Normal Normal Normal Normal  L. Biceps femoris (long head) Normal None 3+ None _______ Normal Normal Normal Normal  L. Lumbar paraspinals (low) Normal None 2+ None _______ Normal Normal Normal Normal  L. Gluteus maximus Normal None None None _______ Normal Normal Normal Normal  L. Gluteus medius Normal None None None _______ Normal Normal Normal Normal  R. Lumbar paraspinals (low) Normal None 3+ None _______ Normal Normal Normal Normal  R. Iliopsoas Normal None None None _______ Normal Normal Normal Normal  R. Adductor longus Normal None None None _______ Normal Normal Normal Normal  R. Tibialis anterior Normal None None None _______ Normal Normal  Normal Normal  R. Vastus medialis Normal None None None _______ Normal Normal Normal Normal  R. Gastrocnemius (Medial head) Normal None None None _______ Normal Normal Normal Normal  R. Vastus lateralis Normal None None None _______ Normal Normal Normal Normal

## 2023-05-12 ENCOUNTER — Telehealth: Payer: Self-pay | Admitting: Neurology

## 2023-05-12 NOTE — Telephone Encounter (Signed)
[employer] auth: [id number] exp. 05/12/23-07/09/23 sent to GI [phone]

## 2023-06-05 ENCOUNTER — Ambulatory Visit
Admission: RE | Admit: 2023-06-05 | Discharge: 2023-06-05 | Disposition: A | Discharge status: Home / Self Care | Payer: Medicare HMO | Source: Ambulatory Visit | Attending: Neurology | Admitting: Neurology

## 2023-06-05 DIAGNOSIS — R200 Anesthesia of skin: Secondary | ICD-10-CM

## 2023-06-05 DIAGNOSIS — G629 Polyneuropathy, unspecified: Secondary | ICD-10-CM | POA: Diagnosis not present

## 2023-06-05 DIAGNOSIS — M5416 Radiculopathy, lumbar region: Secondary | ICD-10-CM

## 2023-06-05 DIAGNOSIS — G8929 Other chronic pain: Secondary | ICD-10-CM

## 2023-06-05 DIAGNOSIS — R29898 Other symptoms and signs involving the musculoskeletal system: Secondary | ICD-10-CM

## 2023-06-05 DIAGNOSIS — M5442 Lumbago with sciatica, left side: Secondary | ICD-10-CM

## 2023-06-07 ENCOUNTER — Telehealth: Payer: Self-pay | Admitting: *Deleted

## 2023-06-07 DIAGNOSIS — M5136 Other intervertebral disc degeneration, lumbar region: Secondary | ICD-10-CM

## 2023-06-07 DIAGNOSIS — M47819 Spondylosis without myelopathy or radiculopathy, site unspecified: Secondary | ICD-10-CM

## 2023-06-07 DIAGNOSIS — M48061 Spinal stenosis, lumbar region without neurogenic claudication: Secondary | ICD-10-CM

## 2023-06-07 NOTE — Telephone Encounter (Signed)
Called pt & LVM asking for call back to discuss her MRI lumbar spine results and recommendations. Left office number for call back.

## 2023-06-07 NOTE — Telephone Encounter (Signed)
-----   Message from Anson Fret sent at 06/07/2023 12:13 PM EDT ----- Please call and discuss with patient: [name] at L5 you have severe narrowing where the nerve roots exit I would like to send you to neurosurgery for evaluation if you are ok with that, surgical or injections. I fyou have a prior preference or relaytionship please let my nurse know thank you. (L4-5: disc bulging and facet hypertrophy with mild spinal stenosis and severe biforaminal stenosis)

## 2023-06-07 NOTE — Telephone Encounter (Signed)
Pt is asking for a call back from [place], [place] re: results.

## 2023-06-08 ENCOUNTER — Ambulatory Visit: Payer: Medicare HMO | Admitting: Adult Health

## 2023-06-08 NOTE — Telephone Encounter (Signed)
I spoke with the patient and discussed her MRI results as noted below.  The patient's questions were answered.  She would like to think about which surgeon she would like to see and then she will call us back today or tomorrow.  She verbalized appreciation for the call.

## 2023-06-08 NOTE — Telephone Encounter (Signed)
Pt called again for the RN. Please call back when available.

## 2023-06-08 NOTE — Telephone Encounter (Signed)
Pt has called back asking that Toma Copier, RN moved forward in making the appointment for her with [facility], pt asking for a call once done.

## 2023-06-08 NOTE — Telephone Encounter (Signed)
Referral placed to [facility] neurosurgery.

## 2023-06-08 NOTE — Addendum Note (Signed)
Addended by: Bertram Savin on: 06/08/2023 04:59 PM   Modules accepted: Orders

## 2023-06-09 ENCOUNTER — Telehealth: Payer: Self-pay | Admitting: Adult Health

## 2023-06-09 NOTE — Telephone Encounter (Addendum)
Referral for neurosurgery fax to [facility]. Phone: [phone], Fax: [phone]

## 2023-06-14 ENCOUNTER — Ambulatory Visit: Payer: Medicare HMO | Admitting: Podiatry

## 2023-06-16 NOTE — Telephone Encounter (Signed)
Referral  has been sent to [facility] on 06/09/23.

## 2023-06-21 ENCOUNTER — Ambulatory Visit: Payer: Medicare HMO | Admitting: Podiatry

## 2023-06-21 ENCOUNTER — Encounter: Payer: Self-pay | Admitting: Podiatry

## 2023-06-21 DIAGNOSIS — M2042 Other hammer toe(s) (acquired), left foot: Secondary | ICD-10-CM

## 2023-06-21 NOTE — Progress Notes (Signed)
Subjective:  Patient ID: [name], female    DOB: 09/11/1946,  MRN: [id number]  Chief Complaint  Patient presents with   Hammer Toe    Left 2nd flexor tenotomy f/up 8/20    77 y.o. female returns for post-op check.  She is doing well not having much pain Review of Systems: Negative except as noted in the HPI. Denies N/V/F/Ch.   Objective:  There were no vitals filed for this visit. There is no height or weight on file to calculate BMI. Constitutional Well developed. Well nourished.  Vascular Foot warm and well perfused. Capillary refill normal to all digits.  Calf is soft and supple, no posterior calf or knee pain, negative Homans' sign  Neurologic Normal speech. Oriented to person, place, and time. Epicritic sensation to light touch grossly present bilaterally.  Dermatologic Skin well-healed not hypertrophic  Orthopedic: She has no tenderness to palpation noted about the surgical site.    Assessment:   1. Hammertoe of left foot    Plan:  Patient was evaluated and treated and all questions answered.  Doing very well and toes in improved position and well-healed at this point.  Return to see me as needed if other toes give her further issues.  No further restrictions shoe gear and activity as tolerated. Return if symptoms worsen or fail to improve.

## 2023-07-06 ENCOUNTER — Ambulatory Visit: Payer: Medicare HMO | Attending: Cardiovascular Disease | Admitting: Cardiovascular Disease

## 2023-07-06 ENCOUNTER — Encounter: Payer: Self-pay | Admitting: Cardiovascular Disease

## 2023-07-06 VITALS — BP 128/76 | HR 60 | Ht 65.0 in | Wt 164.8 lb

## 2023-07-06 DIAGNOSIS — Q2112 Patent foramen ovale: Secondary | ICD-10-CM

## 2023-07-06 NOTE — Patient Instructions (Signed)
Medication Instructions:  No changes *If you need a refill on your cardiac medications before your next appointment, please call your pharmacy*   Lab Work: none If you have labs (blood work) drawn today and your tests are completely normal, you will receive your results only by: MyChart Message (if you have MyChart) OR A paper copy in the mail If you have any lab test that is abnormal or we need to change your treatment, we will call you to review the results.   Testing/Procedures: none   Follow-Up: At [facility], you and your health needs are our priority.  As part of our continuing mission to provide you with exceptional heart care, we have created designated Provider Care Teams.  These Care Teams include your primary Cardiologist (physician) and Advanced Practice Providers (APPs -  Physician Assistants and Nurse Practitioners) who all work together to provide you with the care you need, when you need it.   Your next appointment:   12 month(s)  Provider:   Verne Carrow, MD

## 2023-07-06 NOTE — Progress Notes (Signed)
Chief Complaint  Patient presents with   Follow-up    PFO   History of Present Illness: 77 yo female with history of CVA 2022, PFO closure in 2022, sleep apnea and hypothyroidism who is here today for follow up. She has been followed in the past by Dr. Katrinka Blazing. She was hospitalized with severe anemia in 2022 and had a DVT, PE and stroke during her hospitalization. She was found to have a PFO. This was closed by Dr. Excell Seltzer in 2022. Echo January 2024 with LVEF=55-60%. Well seated atrial septal closure device with no shunt.   She is here today for follow up. The patient denies any chest pain, dyspnea, palpitations, lower extremity edema, orthopnea, PND, dizziness, near syncope or syncope.   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   Depression    DVT (deep venous thrombosis) (HCC)    acute right peroneal DVT & age indeterminant left popliteal DVT 4//1922, s/p Xarelto x 6 months   Fungal infection    around lips started 12-15-2022 on fluconazole for   GERD (gastroesophageal reflux disease)    Hypothyroidism    melanoma right upper thigh 2001   MIGRAINE HEADACHE    Neuromuscular disorder (HCC)    condition where muscle separates from bone left chest, quarter size, pain intermittent   Neuropathy    feet   PFO (patent foramen ovale)    closure 09/17/21   Sleep apnea    does not use CPAP   Stress incontinence    Stroke (HCC) 01/05/2021   Wears glasses     Past Surgical History:  Procedure Laterality Date   BALLOON DILATION N/A 09/18/2020   Procedure: BALLOON DILATION;  Surgeon: Kerin Salen, MD;  Location: [facility];  Service: Gastroenterology;  Laterality: N/A;   bilateral bunion removal     yrs ago   BUBBLE STUDY  07/03/2021   Procedure: BUBBLE STUDY;  Surgeon: Lewayne Bunting, MD;  Location: [facility] ENDOSCOPY;  Service: Cardiovascular;;   CYSTOSCOPY WITH INJECTION  N/A 01/11/2023   Procedure: CYSTOSCOPY WITH INJECTION OF Mariam Dollar;  Surgeon: Noel Christmas, MD;  Location: [facility] ;  Service: Urology;  Laterality: N/A;  45 MINS   DRUG INDUCED ENDOSCOPY Bilateral 07/28/2022   Procedure: DRUG INDUCED ENDOSCOPY;  Surgeon: Christia Reading, MD;  Location: [facility];  Service: ENT;  Laterality: Bilateral;   ESOPHAGOGASTRODUODENOSCOPY (EGD) WITH PROPOFOL N/A 09/18/2020   Procedure: ESOPHAGOGASTRODUODENOSCOPY (EGD) WITH PROPOFOL;  Surgeon: Kerin Salen, MD;  Location: [facility];  Service: Gastroenterology;  Laterality: N/A;   EYE SURGERY Bilateral    Cataract L eye 1-20, R eye 3-20    FOOT ARTHRODESIS Left 11/19/2022   Procedure: ARTHRODESIS FOOT WITH BONE GRAFT;  Surgeon: Edwin Cap, DPM;  Location: [facility] OR;  Service: Podiatry;  Laterality: Left;   FOREIGN BODY REMOVAL  09/18/2020   Procedure: FOREIGN BODY REMOVAL;  Surgeon: Kerin Salen, MD;  Location: [facility];  Service: Gastroenterology;;   Steward Drone FUSION Left 11/19/2022   Procedure: HALLUX METATARSAL PHALANGEAL JOINT FUSION;  Surgeon: Edwin Cap, DPM;  Location: [facility] OR;  Service: Podiatry;  Laterality: Left;   HAMMER TOE SURGERY Left 11/19/2022   Procedure: HAMMER TOE CORRECTION SECOND/THIRD;  Surgeon: Edwin Cap, DPM;  Location: [facility] OR;  Service: Podiatry;  Laterality: Left;   METATARSAL OSTEOTOMY Left 11/19/2022   Procedure: METATARSAL OSTEOTOMY;  Surgeon: Edwin Cap, DPM;  Location: [facility] OR;  Service: Podiatry;  Laterality: Left;  BLOCK   MOHS SURGERY  2001   upper right thigh   PATENT FORAMEN OVALE(PFO) CLOSURE N/A 08/28/2021   Procedure: PATENT FORAMEN OVALE (PFO) CLOSURE;  Surgeon: Tonny Bollman, MD;  Location: [facility] INVASIVE CV LAB;  Service: Cardiovascular;  Laterality: N/A;   REVERSE SHOULDER ARTHROPLASTY Right 03/16/2019   Procedure: REVERSE SHOULDER ARTHROPLASTY;  Surgeon: Beverely Low, MD;  Location: [facility];  Service: Orthopedics;   Laterality: Right;  interscalene block   TEE WITHOUT CARDIOVERSION N/A 07/03/2021   Procedure: TRANSESOPHAGEAL ECHOCARDIOGRAM (TEE);  Surgeon: Lewayne Bunting, MD;  Location: [facility] ENDOSCOPY;  Service: Cardiovascular;  Laterality: N/A;   TUBAL LIGATION     yrs ago    Current Outpatient Medications  Medication Sig Dispense Refill   acetaminophen (TYLENOL) 500 MG tablet Take 1,000 mg by mouth every 4 (four) hours as needed for headache.     Calcium Carbonate+Vitamin D 600-200 MG-UNIT TABS Take 1 tablet by mouth 2 (two) times daily.     Carboxymethylcellulose Sod PF (EQ RESTORE PLUS LUBRICANT EYE) 0.5 % SOLN Place 1 drop into both eyes daily as needed (Dry eye).     clopidogrel (PLAVIX) 75 MG tablet Take 75 mg by mouth daily.     folic acid (FOLVITE) 1 MG tablet Take 2 tablets (2 mg total) by mouth daily. (Patient taking differently: Take 1 mg by mouth daily.)     gabapentin (NEURONTIN) 300 MG capsule TAKE 1 TO 2 CAPSULES FOUR TIMES DAILY AS NEEDED (Patient taking differently: Take 300 mg by mouth See admin instructions. Take 300 mg eight times a day) 720 capsule 3   HYDROcodone-acetaminophen (NORCO/VICODIN) 5-325 MG tablet Take 1 tablet by mouth 2 (two) times daily.     hydrocortisone 2.5 % cream Apply 1 Application topically in the morning. On face     levothyroxine (SYNTHROID) 112 MCG tablet Take 112 mcg by mouth daily before breakfast.     LORazepam (ATIVAN) 2 MG tablet Take 2 mg by mouth 3 (three) times daily as needed for anxiety.     MELATONIN PO Take 5 mg by mouth daily as needed.     meloxicam (MOBIC) 15 MG tablet Take 15 mg by mouth daily at 12 noon.     methocarbamol (ROBAXIN) 750 MG tablet Take 750 mg by mouth 2 (two) times daily as needed for muscle spasms.     Multiple Vitamins-Minerals (MULTIVITAMIN WITH MINERALS) tablet Take 1 tablet by mouth daily. Complete     mupirocin ointment (BACTROBAN) 2 % Apply 1 Application topically every evening.     nystatin (MYCOSTATIN/NYSTOP) powder  Apply 1 application topically 3 (three) times daily as needed (irritation).     omeprazole (PRILOSEC) 20 MG capsule Take 20 mg by mouth as needed.     polycarbophil (FIBERCON) 625 MG tablet Take 625 mg by mouth in the morning and at bedtime.     sertraline (ZOLOFT) 50 MG tablet Take 50 mg by mouth daily.     triamcinolone cream (KENALOG) 0.1 % 1 application daily as needed (Vaginal irritations).     No current facility-administered medications for this visit.    Allergies  Allergen Reactions   Oxycodone Other (See Comments)    Hallucinations    Aspirin Other (See Comments)    Interferes with headaches  Other Reaction(s): stomach issues   Imipenem Other (See Comments)    Warm and tingly all over   Methocarbamol Rash  Statins Other (See Comments)    leg pain, muscle cramps   Losartan Other (See Comments)    Pt unsure   Cephalexin Other (See Comments)    Stomach cramps   Clindamycin/Lincomycin Nausea And Vomiting and Rash   Contrast Media [Iodinated Contrast Media] Rash   Losartan Potassium-Hctz Other (See Comments)    Unknown Reaction - pt is unaware of this  Other Reaction(s): do not remember   Methylprednisolone Rash    Rash on face   Penicillins Rash    Did it involve swelling of the face/tongue/throat, SOB, or low BP? No Did it involve sudden or severe rash/hives, skin peeling, or any reaction on the inside of your mouth or nose? Yes Did you need to seek medical attention at a hospital or doctor's office? No When did it last happen?      2011 If all above answers are "NO", may proceed with cephalosporin use.    Quinolones Rash    Social History   Socioeconomic History   Marital status: Married    Spouse name: Not on file   Number of children: Not on file   Years of education: Not on file   Highest education level: Not on file  Occupational History   Not on file  Tobacco Use   Smoking status: Former    Current packs/day: 0.00    Types: Cigarettes    Quit  date: 63    Years since quitting: 55.8   Smokeless tobacco: Never  Vaping Use   Vaping status: Never Used  Substance and Sexual Activity   Alcohol use: No   Drug use: No   Sexual activity: Not Currently    Birth control/protection: Post-menopausal  Other Topics Concern   Not on file  Social History Narrative   Lives at home with husband (has mild Dementia) of 55 years   Right handed    Caffeine: 1 cup tea/day   Social Determinants of Health   Financial Resource Strain: Not on file  Food Insecurity: Not on file  Transportation Needs: Not on file  Physical Activity: Not on file  Stress: Not on file  Social Connections: Not on file  Intimate Partner Violence: Not on file    Family History  Problem Relation Age of Onset   Heart disease Mother    Stroke Father    Arthritis Sister    Diabetes Brother    Arthritis Brother    Neuropathy Brother    Neuropathy Sister    Diabetes Brother     Review of Systems:  As stated in the HPI and otherwise negative.   BP 128/76   Pulse 60   Ht 5\' 5"  (1.651 m)   Wt 74.8 kg   SpO2 98%   BMI 27.42 kg/m   Physical Examination: General: Well developed, well nourished, NAD  HEENT: OP clear, mucus membranes moist  SKIN: warm, dry. No rashes. Neuro: No focal deficits  Musculoskeletal: Muscle strength 5/5 all ext  Psychiatric: Mood and affect normal  Neck: No JVD, no carotid bruits, no thyromegaly, no lymphadenopathy.  Lungs:Clear bilaterally, no wheezes, rhonci, crackles Cardiovascular: Regular rate and rhythm. No murmurs, gallops or rubs. Abdomen:Soft. Bowel sounds present. Non-tender.  Extremities: No lower extremity edema. Pulses are 2 + in the bilateral DP/PT.  EKG:  EKG is ordered today. The ekg ordered today demonstrates  EKG Interpretation Date/Time:  Wednesday July 06 2023 10:41:25 EDT Ventricular Rate:  60 PR Interval:  170 QRS Duration:  72 QT Interval:  418 QTC Calculation: 418 R Axis:   -23  Text  Interpretation: Normal sinus rhythm Confirmed by Verne Carrow [phone]) on 07/06/2023 10:46:46 AM    Recent Labs: 11/19/2022: Platelets 399 01/11/2023: BUN 10; Creatinine, Ser 0.50; Hemoglobin 14.3; Potassium 4.4; Sodium 140   Lipid Panel    Component Value Date/Time   CHOL 158 01/05/2021 0317   TRIG 70 01/05/2021 0317   HDL 44 01/05/2021 0317   CHOLHDL 3.6 01/05/2021 0317   VLDL 14 01/05/2021 0317   LDLCALC 100 (H) 01/05/2021 0317     Wt Readings from Last 3 Encounters:  07/06/23 74.8 kg  04/28/23 74.4 kg  03/14/23 75.3 kg    Assessment and Plan:   1. PFO: She is post closure device in 2022. Continue Plavix.   Labs/ tests ordered today include:   Orders Placed This Encounter  Procedures   EKG 12-Lead   Disposition:   F/U with me in one year   Signed, Verne Carrow, MD, [facility] 07/06/2023 10:55 AM    [facility] [address] [place], [place], [place]  [address] Phone: [phone]; Fax: [phone]

## 2023-07-10 NOTE — Progress Notes (Unsigned)
PATIENT: [name] DOB: 08/05/1946  REASON FOR VISIT: follow up HISTORY FROM: patient PRIMARY NEUROLOGIST: Dr. Lucia Gaskins   No chief complaint on file.    HISTORY OF PRESENT ILLNESS: Today 07/10/23:  NCV/EMG: Conclusion:  EMG needle study showed acute/ongoing denervation and chronic neurogenic changes in the bilateral lower lumbar paraspinals as well as left leg muscles that share L5, S1 innervation consistent with lumbar radiculopathy.  MRI of the lumbar spine recommended.  Nerve conduction studies showed minimal decrease in amplitude of 2 sensory nerves suggestive of concomitant mild sensory, axonal large-fiber polyneuropathy.  MRI lumbarIMPRESSION:    MRI lumbar spine without contrast demonstrating:   - At L3-4: disc bulging and facet hypertrophy with mild-moderate spinal stenosis and mild right and moderate left stenosis.   - At L2-3: disc bulging and facet hypertrophy with mild spinal stenosis and mild right and moderate left stenosis.   - At L4-5: disc bulging and facet hypertrophy with mild spinal stenosis and severe biforaminal stenosis.  [name] is a 77 y.o. female with a history of neuropathy. Returns today for follow-up.  She comes back today for sooner visit.  States that she is having numbness tingling burning and pain in both legs.  Reports that she has severe pain in the inside thigh  of both legs.  Reports that she will get a sharp pain starting in the hip radiating down the outside of the left leg.  She states the pain stays there.  Denies any significant back pain unless she stays bent over for a significant period of time.  She reports that she has had previousnjury on the left foot which affects the way she ambulates.  Denies any recent falls.  Continues to take gabapentin 1 to 2 tablets 4 times a day.  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   12/03/22:   [name] is a 77 y.o. female with  a history of neuropathy. Returns today for follow-up.  She reports that she has spaced out her gabapentin instead of taking 2 tablets 4 times a day she takes 1 tablet every 2 hours for total of 8 times a day.  The total amount of gabapentin has remained the same she is just taking it more frequently.  She reports that this is working better for her symptoms.  Reports no recent falls.  She did have surgery on her foot.  Returns today for an evaluation.     11/03/22: [name] is a 77 y.o. female with a history of neuropathy. Returns today for follow-up. Reports burning pain is in the feet. But will have sharp pain that comes up the inside of the right leg on the inner thigh and the outer thigh of the left leg. No significant changes with gait or balance. No falls. Continues on gabapentin 2 tablets four times a day. Sometimes will not take full dose if she is not having pain.    Will be having surgery on left foot in march d/t hammer toes.  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  08/10/22: [name] is a 77 y.o. female who has been followed in this office for neuropathy. Returns today for follow-up. Continues on gabapentin. Working well for neuropathy. On occasion she gets pain that radiates up the inside of both legs more so on the right. Toes are cold. Will wake up nightly with muscle cramps. Doesn't feel like she  drinks a lot of water at night. Also gets muscle cramps in the lower back that radiates up the back and into the chest. Uses robaxin and ativan and that relieves symptoms.  Robaxin does not seem to help with muscle cramps during the night.  She returns today for an evaluation.    REVIEW OF SYSTEMS: Out of a complete 14 system review of symptoms, the patient complains only of the following symptoms, and all other reviewed systems are negative.  ALLERGIES: Allergies  Allergen Reactions   Oxycodone Other (See Comments)    Hallucinations     Aspirin Other (See Comments)    Interferes with headaches  Other Reaction(s): stomach issues   Imipenem Other (See Comments)    Warm and tingly all over   Methocarbamol Rash   Statins Other (See Comments)    leg pain, muscle cramps   Losartan Other (See Comments)    Pt unsure   Cephalexin Other (See Comments)    Stomach cramps   Clindamycin/Lincomycin Nausea And Vomiting and Rash   Contrast Media [Iodinated Contrast Media] Rash   Losartan Potassium-Hctz Other (See Comments)    Unknown Reaction - pt is unaware of this  Other Reaction(s): do not remember   Methylprednisolone Rash    Rash on face   Penicillins Rash    Did it involve swelling of the face/tongue/throat, SOB, or low BP? No Did it involve sudden or severe rash/hives, skin peeling, or any reaction on the inside of your mouth or nose? Yes Did you need to seek medical attention at a hospital or doctor's office? No When did it last happen?      2011 If all above answers are "NO", may proceed with cephalosporin use.    Quinolones Rash    HOME MEDICATIONS: Outpatient Medications Prior to Visit  Medication Sig Dispense Refill   acetaminophen (TYLENOL) 500 MG tablet Take 1,000 mg by mouth every 4 (four) hours as needed for headache.     Calcium Carbonate+Vitamin D 600-200 MG-UNIT TABS Take 1 tablet by mouth 2 (two) times daily.     Carboxymethylcellulose Sod PF (EQ RESTORE PLUS LUBRICANT EYE) 0.5 % SOLN Place 1 drop into both eyes daily as needed (Dry eye).     clopidogrel (PLAVIX) 75 MG tablet Take 75 mg by mouth daily.     folic acid (FOLVITE) 1 MG tablet Take 2 tablets (2 mg total) by mouth daily. (Patient taking differently: Take 1 mg by mouth daily.)     gabapentin (NEURONTIN) 300 MG capsule TAKE 1 TO 2 CAPSULES FOUR TIMES DAILY AS NEEDED (Patient taking differently: Take 300 mg by mouth See admin instructions. Take 300 mg eight times a day) 720 capsule 3   HYDROcodone-acetaminophen (NORCO/VICODIN) 5-325 MG tablet Take  1 tablet by mouth 2 (two) times daily.     hydrocortisone 2.5 % cream Apply 1 Application topically in the morning. On face     levothyroxine (SYNTHROID) 112 MCG tablet Take 112 mcg by mouth daily before breakfast.     LORazepam (ATIVAN) 2 MG tablet Take 2 mg by mouth 3 (three) times daily as needed for anxiety.     MELATONIN PO Take 5 mg by mouth daily as needed.     meloxicam (MOBIC) 15 MG tablet Take 15 mg by mouth daily at 12 noon.     methocarbamol (ROBAXIN) 750 MG tablet Take 750 mg by mouth 2 (two) times daily as needed for muscle spasms.     Multiple Vitamins-Minerals (MULTIVITAMIN WITH  MINERALS) tablet Take 1 tablet by mouth daily. Complete     mupirocin ointment (BACTROBAN) 2 % Apply 1 Application topically every evening.     nystatin (MYCOSTATIN/NYSTOP) powder Apply 1 application topically 3 (three) times daily as needed (irritation).     omeprazole (PRILOSEC) 20 MG capsule Take 20 mg by mouth as needed.     polycarbophil (FIBERCON) 625 MG tablet Take 625 mg by mouth in the morning and at bedtime.     sertraline (ZOLOFT) 50 MG tablet Take 50 mg by mouth daily.     triamcinolone cream (KENALOG) 0.1 % 1 application daily as needed (Vaginal irritations).     No facility-administered medications prior to visit.    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   Depression    DVT (deep venous thrombosis) (HCC)    acute right peroneal DVT & age indeterminant left popliteal DVT 4//1922, s/p Xarelto x 6 months   Fungal infection    around lips started 12-15-2022 on fluconazole for   GERD (gastroesophageal reflux disease)    Hypothyroidism    melanoma right upper thigh 2001   MIGRAINE HEADACHE    Neuromuscular disorder (HCC)    condition where muscle separates from bone left chest, quarter size, pain intermittent   Neuropathy    feet   PFO (patent foramen ovale)    closure 09/17/21   Sleep apnea     does not use CPAP   Stress incontinence    Stroke (HCC) 01/05/2021   Wears glasses     PAST SURGICAL HISTORY: Past Surgical History:  Procedure Laterality Date   BALLOON DILATION N/A 09/18/2020   Procedure: BALLOON DILATION;  Surgeon: Kerin Salen, MD;  Location: [facility];  Service: Gastroenterology;  Laterality: N/A;   bilateral bunion removal     yrs ago   BUBBLE STUDY  07/03/2021   Procedure: BUBBLE STUDY;  Surgeon: Lewayne Bunting, MD;  Location: [facility] ENDOSCOPY;  Service: Cardiovascular;;   CYSTOSCOPY WITH INJECTION N/A 01/11/2023   Procedure: CYSTOSCOPY WITH INJECTION OF Mariam Dollar;  Surgeon: Noel Christmas, MD;  Location: [facility] ;  Service: Urology;  Laterality: N/A;  45 MINS   DRUG INDUCED ENDOSCOPY Bilateral 07/28/2022   Procedure: DRUG INDUCED ENDOSCOPY;  Surgeon: Christia Reading, MD;  Location: [facility];  Service: ENT;  Laterality: Bilateral;   ESOPHAGOGASTRODUODENOSCOPY (EGD) WITH PROPOFOL N/A 09/18/2020   Procedure: ESOPHAGOGASTRODUODENOSCOPY (EGD) WITH PROPOFOL;  Surgeon: Kerin Salen, MD;  Location: [facility];  Service: Gastroenterology;  Laterality: N/A;   EYE SURGERY Bilateral    Cataract L eye 1-20, R eye 3-20    FOOT ARTHRODESIS Left 11/19/2022   Procedure: ARTHRODESIS FOOT WITH BONE GRAFT;  Surgeon: Edwin Cap, DPM;  Location: [facility] OR;  Service: Podiatry;  Laterality: Left;   FOREIGN BODY REMOVAL  09/18/2020   Procedure: FOREIGN BODY REMOVAL;  Surgeon: Kerin Salen, MD;  Location: [facility];  Service: Gastroenterology;;   Steward Drone FUSION Left 11/19/2022   Procedure: HALLUX METATARSAL PHALANGEAL JOINT FUSION;  Surgeon: Edwin Cap, DPM;  Location: [facility] OR;  Service: Podiatry;  Laterality: Left;   HAMMER TOE SURGERY Left 11/19/2022   Procedure: HAMMER TOE CORRECTION SECOND/THIRD;  Surgeon: Edwin Cap, DPM;  Location: [facility] OR;  Service: Podiatry;  Laterality: Left;   METATARSAL OSTEOTOMY Left 11/19/2022   Procedure:  METATARSAL OSTEOTOMY;  Surgeon: Edwin Cap, DPM;  Location: [facility]  OR;  Service: Podiatry;  Laterality: Left;  BLOCK   MOHS SURGERY  2001   upper right thigh   PATENT FORAMEN OVALE(PFO) CLOSURE N/A 08/28/2021   Procedure: PATENT FORAMEN OVALE (PFO) CLOSURE;  Surgeon: Tonny Bollman, MD;  Location: [facility] INVASIVE CV LAB;  Service: Cardiovascular;  Laterality: N/A;   REVERSE SHOULDER ARTHROPLASTY Right 03/16/2019   Procedure: REVERSE SHOULDER ARTHROPLASTY;  Surgeon: Beverely Low, MD;  Location: [facility];  Service: Orthopedics;  Laterality: Right;  interscalene block   TEE WITHOUT CARDIOVERSION N/A 07/03/2021   Procedure: TRANSESOPHAGEAL ECHOCARDIOGRAM (TEE);  Surgeon: Lewayne Bunting, MD;  Location: [facility] ENDOSCOPY;  Service: Cardiovascular;  Laterality: N/A;   TUBAL LIGATION     yrs ago    FAMILY HISTORY: Family History  Problem Relation Age of Onset   Heart disease Mother    Stroke Father    Arthritis Sister    Diabetes Brother    Arthritis Brother    Neuropathy Brother    Neuropathy Sister    Diabetes Brother     SOCIAL HISTORY: Social History   Socioeconomic History   Marital status: Married    Spouse name: Not on file   Number of children: Not on file   Years of education: Not on file   Highest education level: Not on file  Occupational History   Not on file  Tobacco Use   Smoking status: Former    Current packs/day: 0.00    Types: Cigarettes    Quit date: 1969    Years since quitting: 55.8   Smokeless tobacco: Never  Vaping Use   Vaping status: Never Used  Substance and Sexual Activity   Alcohol use: No   Drug use: No   Sexual activity: Not Currently    Birth control/protection: Post-menopausal  Other Topics Concern   Not on file  Social History Narrative   Lives at home with husband (has mild Dementia) of 55 years   Right handed    Caffeine: 1 cup tea/day   Social Determinants of Health   Financial Resource Strain: Not on file  Food Insecurity: Not on  file  Transportation Needs: Not on file  Physical Activity: Not on file  Stress: Not on file  Social Connections: Not on file  Intimate Partner Violence: Not on file      PHYSICAL EXAM  There were no vitals filed for this visit.  There is no height or weight on file to calculate BMI.  Generalized: Well developed, in no acute distress   Neurological examination  Mentation: Alert oriented to time, place, history taking. Follows all commands speech and language fluent Cranial nerve II-XII: Pupils were equal round reactive to light. Extraocular movements were full, visual field were full on confrontational test. Facial sensation and strength were normal.. Head turning and shoulder shrug  were normal and symmetric. Motor: The motor testing reveals 5 over 5 strength of all 4 extremities. Good symmetric motor tone is noted throughout.  No pain with palpation of the left buttocks. Sensory: Sensory testing is intact to soft touch on all 4 extremities.  Pinprick and vibration sensation decreased in a stocking-like pattern in both lower extremities.  No evidence of extinction is noted.  Coordination: Cerebellar testing reveals good finger-nose-finger and heel-to-shin bilaterally.  Gait and station: Gait is normal.  Unable to walk on her toes or heel due to previous foot injury   DIAGNOSTIC DATA (LABS, IMAGING, TESTING) - I reviewed patient records, labs, notes, testing and imaging myself where available.  Lab Results  Component Value Date   WBC 9.0 11/19/2022   HGB 14.3 01/11/2023   HCT 42.0 01/11/2023   MCV 93.7 11/19/2022   PLT 399 11/19/2022      Component Value Date/Time   NA 140 01/11/2023 0805   NA 139 02/24/2022 1001   K 4.4 01/11/2023 0805   CL 108 01/11/2023 0805   CO2 22 11/19/2022 1315   GLUCOSE 102 (H) 01/11/2023 0805   BUN 10 01/11/2023 0805   BUN 7 (L) 02/24/2022 1001   CREATININE 0.50 01/11/2023 0805   CREATININE 0.65 06/17/2021 0955   CALCIUM 9.5 11/19/2022 1315    PROT 6.4 02/24/2022 1001   ALBUMIN 4.2 06/17/2021 0955   ALBUMIN 4.5 08/21/2020 1519   AST 16 06/17/2021 0955   ALT 15 06/17/2021 0955   ALKPHOS 44 06/17/2021 0955   BILITOT 0.5 06/17/2021 0955   GFRNONAA >60 11/19/2022 1315   GFRNONAA >60 06/17/2021 0955   GFRAA 111 08/21/2020 1519   Lab Results  Component Value Date   CHOL 158 01/05/2021   HDL 44 01/05/2021   LDLCALC 100 (H) 01/05/2021   TRIG 70 01/05/2021   CHOLHDL 3.6 01/05/2021   Lab Results  Component Value Date   HGBA1C 5.8 (H) 02/24/2022   Lab Results  Component Value Date   VITAMINB12 710 02/24/2022   Lab Results  Component Value Date   TSH 0.902 03/05/2021      ASSESSMENT AND PLAN 77 y.o. year old female  has a past medical history of Anxiety, Arthritis, Autoimmune hemolytic anemia (HCC) (12/2020), Depression, DVT (deep venous thrombosis) (HCC), Fungal infection, GERD (gastroesophageal reflux disease), Hypothyroidism, melanoma right upper thigh (2001), MIGRAINE HEADACHE, Neuromuscular disorder (HCC), Neuropathy, PFO (patent foramen ovale), Sleep apnea, Stress incontinence, Stroke (HCC) (01/05/2021), and Wears glasses. here with:  1.  Neuropathy 2.  Left-sided sciatica?  -Patient will continue gabapentin 300-600 mg 4 times a day.  Advised that she should try to take her medication consistently to avoid breakthrough symptoms -Will repeat nerve conduction studies with EMG due to worsening/new symptoms -Follow-up in 6 months or sooner if needed      Butch Penny, MSN, NP-C 07/10/2023, 2:08 PM [facility] [address] [place], [place] [address] [phone]

## 2023-07-11 ENCOUNTER — Encounter: Payer: Self-pay | Admitting: Adult Health

## 2023-07-11 ENCOUNTER — Ambulatory Visit: Payer: Medicare HMO | Admitting: Adult Health

## 2023-07-11 VITALS — BP 145/73 | HR 55 | Ht 65.0 in | Wt 168.0 lb

## 2023-07-11 DIAGNOSIS — M5416 Radiculopathy, lumbar region: Secondary | ICD-10-CM

## 2023-07-11 DIAGNOSIS — G629 Polyneuropathy, unspecified: Secondary | ICD-10-CM | POA: Diagnosis not present

## 2023-07-26 ENCOUNTER — Ambulatory Visit: Payer: Medicare HMO | Admitting: Podiatry

## 2023-11-11 ENCOUNTER — Telehealth: Payer: Self-pay | Admitting: Adult Health

## 2023-11-11 NOTE — Telephone Encounter (Signed)
Pt has called to report that she is now with [facility], they advised her to report that re: her Home delivery pharmacy, our office has their E-script information [facility] [place] [id number]#[id number] for her Rx gabapentin (NEURONTIN) 300 MG capsule

## 2023-11-14 ENCOUNTER — Telehealth: Payer: Self-pay | Admitting: Adult Health

## 2023-11-14 MED ORDER — GABAPENTIN 300 MG PO CAPS: 300.0000 mg | ORAL_CAPSULE | Freq: Four times a day (QID) | ORAL | 2 refills | Status: DC | PRN

## 2023-11-14 NOTE — Telephone Encounter (Signed)
 I have send new prescription [facility].

## 2023-11-14 NOTE — Telephone Encounter (Signed)
 Pt called to confirm Gabapentin has been sent to the pharmacy and verify appointment.

## 2024-02-09 ENCOUNTER — Telehealth: Payer: Self-pay | Admitting: *Deleted

## 2024-02-09 NOTE — Telephone Encounter (Signed)
 Called patient to schedule televisit for preop clearance patient stated she does not need clearance from us  due to Dr. Abel Hoe is not the one who prescribes her blood thinner it is actually the specialist who gives her injection and she has made requesting office aware of that and they voiced understanding therefore per patient she does not need clearance from us . Will remove patient from preop pool

## 2024-02-09 NOTE — Telephone Encounter (Signed)
   Name: [name]  DOB: 11-16-1945  MRN: [id number]  Primary Cardiologist: Antoinette Batman, MD   Preoperative team, please contact this patient and set up a phone call appointment for further preoperative risk assessment. Please obtain consent and complete medication review. Thank you for your help.  I confirm that guidance regarding antiplatelet and oral anticoagulation therapy has been completed and, if necessary, noted below.  Clopidogrel  (Plavix ) can be held for 5 days prior to her surgery and resumed as soon as possible post op.   I also confirmed the patient resides in the state of [place] . As per [facility] Medical Board telemedicine laws, the patient must reside in the state in which the provider is licensed.   Francene Ing, Retha Cast, NP 02/09/2024, 8:19 AM [facility]

## 2024-02-09 NOTE — Telephone Encounter (Signed)
   Pre-operative Risk Assessment    Patient Name: [name]  DOB: 03/01/1946 MRN: [id number]   Date of last office visit: 07/14/23 Date of next office visit: NONE   Request for Surgical Clearance    Procedure:  L3-L4 ESI  Date of Surgery:  Clearance TBD                                Surgeon:   Surgeon's Group or Practice Name:  [facility] Phone number:  [phone] Fax number:  [phone]   Type of Clearance Requested:   - Medical  - Pharmacy:  Hold Clopidogrel  (Plavix ) X'S 7 DAYS    Type of Anesthesia:  Not Indicated   Additional requests/questions:    Berenda Breaker   02/09/2024, 6:58 AM

## 2024-02-20 ENCOUNTER — Emergency Department (HOSPITAL_COMMUNITY)

## 2024-02-20 ENCOUNTER — Other Ambulatory Visit: Payer: Self-pay

## 2024-02-20 ENCOUNTER — Inpatient Hospital Stay (HOSPITAL_COMMUNITY)
Admission: EM | Admit: 2024-02-20 | Discharge: 2024-02-23 | DRG: 809 | Disposition: A | Discharge status: Home / Self Care | Attending: Internal Medicine | Admitting: Internal Medicine

## 2024-02-20 ENCOUNTER — Encounter (HOSPITAL_COMMUNITY): Payer: Self-pay

## 2024-02-20 DIAGNOSIS — E871 Hypo-osmolality and hyponatremia: Secondary | ICD-10-CM | POA: Diagnosis present

## 2024-02-20 DIAGNOSIS — D591 Autoimmune hemolytic anemia, unspecified: Secondary | ICD-10-CM | POA: Diagnosis not present

## 2024-02-20 DIAGNOSIS — R519 Headache, unspecified: Secondary | ICD-10-CM

## 2024-02-20 DIAGNOSIS — K219 Gastro-esophageal reflux disease without esophagitis: Secondary | ICD-10-CM | POA: Diagnosis present

## 2024-02-20 DIAGNOSIS — D5911 Warm autoimmune hemolytic anemia: Secondary | ICD-10-CM | POA: Diagnosis present

## 2024-02-20 DIAGNOSIS — Z79891 Long term (current) use of opiate analgesic: Secondary | ICD-10-CM | POA: Diagnosis not present

## 2024-02-20 DIAGNOSIS — Z79890 Hormone replacement therapy (postmenopausal): Secondary | ICD-10-CM | POA: Diagnosis not present

## 2024-02-20 DIAGNOSIS — Z7902 Long term (current) use of antithrombotics/antiplatelets: Secondary | ICD-10-CM

## 2024-02-20 DIAGNOSIS — G4733 Obstructive sleep apnea (adult) (pediatric): Secondary | ICD-10-CM | POA: Diagnosis present

## 2024-02-20 DIAGNOSIS — G629 Polyneuropathy, unspecified: Secondary | ICD-10-CM

## 2024-02-20 DIAGNOSIS — Z8673 Personal history of transient ischemic attack (TIA), and cerebral infarction without residual deficits: Secondary | ICD-10-CM | POA: Diagnosis not present

## 2024-02-20 DIAGNOSIS — K279 Peptic ulcer, site unspecified, unspecified as acute or chronic, without hemorrhage or perforation: Secondary | ICD-10-CM | POA: Insufficient documentation

## 2024-02-20 DIAGNOSIS — Z91041 Radiographic dye allergy status: Secondary | ICD-10-CM

## 2024-02-20 DIAGNOSIS — Z87891 Personal history of nicotine dependence: Secondary | ICD-10-CM

## 2024-02-20 DIAGNOSIS — K311 Adult hypertrophic pyloric stenosis: Secondary | ICD-10-CM | POA: Insufficient documentation

## 2024-02-20 DIAGNOSIS — D638 Anemia in other chronic diseases classified elsewhere: Secondary | ICD-10-CM | POA: Diagnosis present

## 2024-02-20 DIAGNOSIS — M48061 Spinal stenosis, lumbar region without neurogenic claudication: Secondary | ICD-10-CM | POA: Insufficient documentation

## 2024-02-20 DIAGNOSIS — G43909 Migraine, unspecified, not intractable, without status migrainosus: Secondary | ICD-10-CM | POA: Diagnosis present

## 2024-02-20 DIAGNOSIS — D599 Acquired hemolytic anemia, unspecified: Secondary | ICD-10-CM | POA: Diagnosis not present

## 2024-02-20 DIAGNOSIS — Z791 Long term (current) use of non-steroidal anti-inflammatories (NSAID): Secondary | ICD-10-CM | POA: Diagnosis not present

## 2024-02-20 DIAGNOSIS — Z86718 Personal history of other venous thrombosis and embolism: Secondary | ICD-10-CM

## 2024-02-20 DIAGNOSIS — E039 Hypothyroidism, unspecified: Secondary | ICD-10-CM | POA: Diagnosis present

## 2024-02-20 DIAGNOSIS — M1990 Unspecified osteoarthritis, unspecified site: Secondary | ICD-10-CM | POA: Insufficient documentation

## 2024-02-20 DIAGNOSIS — D649 Anemia, unspecified: Secondary | ICD-10-CM

## 2024-02-20 DIAGNOSIS — R1084 Generalized abdominal pain: Secondary | ICD-10-CM | POA: Insufficient documentation

## 2024-02-20 DIAGNOSIS — D589 Hereditary hemolytic anemia, unspecified: Secondary | ICD-10-CM | POA: Diagnosis present

## 2024-02-20 LAB — TECHNOLOGIST SMEAR REVIEW

## 2024-02-20 LAB — COMPREHENSIVE METABOLIC PANEL WITH GFR
ALT: 14 U/L (ref 0–44)
AST: 29 U/L (ref 15–41)
Albumin: 3.9 g/dL (ref 3.5–5.0)
Alkaline Phosphatase: 59 U/L (ref 38–126)
Anion gap: 4 — ABNORMAL LOW (ref 5–15)
BUN: 15 mg/dL (ref 8–23)
CO2: 20 mmol/L — ABNORMAL LOW (ref 22–32)
Calcium: 8.1 mg/dL — ABNORMAL LOW (ref 8.9–10.3)
Chloride: 104 mmol/L (ref 98–111)
Creatinine, Ser: 0.57 mg/dL (ref 0.44–1.00)
GFR, Estimated: >60 mL/min (ref >60)
Glucose, Bld: 126 mg/dL — ABNORMAL HIGH (ref 70–99)
Potassium: 3.7 mmol/L (ref 3.5–5.1)
Sodium: 128 mmol/L — ABNORMAL LOW (ref 135–145)
Total Bilirubin: 5.4 mg/dL — ABNORMAL HIGH (ref 0.0–1.2)
Total Protein: 6.5 g/dL (ref 6.5–8.1)

## 2024-02-20 LAB — URINALYSIS, ROUTINE W REFLEX MICROSCOPIC
Bacteria, UA: NONE SEEN
Bilirubin Urine: NEGATIVE
Glucose, UA: NEGATIVE mg/dL
Hgb urine dipstick: NEGATIVE
Ketones, ur: NEGATIVE mg/dL
Nitrite: NEGATIVE
Protein, ur: NEGATIVE mg/dL
Specific Gravity, Urine: 1.01 (ref 1.005–1.030)
pH: 5 (ref 5.0–8.0)

## 2024-02-20 LAB — PREPARE RBC (CROSSMATCH)

## 2024-02-20 LAB — CBC WITH DIFFERENTIAL/PLATELET
Abs Immature Granulocytes: 0.31 10*3/uL — ABNORMAL HIGH (ref 0.00–0.07)
Basophils Absolute: 0 10*3/uL (ref 0.0–0.1)
Basophils Relative: 0 %
Eosinophils Absolute: 0.1 10*3/uL (ref 0.0–0.5)
Eosinophils Relative: 1 %
HCT: 16.9 % — ABNORMAL LOW (ref 36.0–46.0)
Hemoglobin: 5.3 g/dL — CL (ref 12.0–15.0)
Immature Granulocytes: 3 %
Lymphocytes Relative: 23 %
Lymphs Abs: 2.3 10*3/uL (ref 0.7–4.0)
MCH: 35.1 pg — ABNORMAL HIGH (ref 26.0–34.0)
MCHC: 31.4 g/dL (ref 30.0–36.0)
MCV: 111.9 fL — ABNORMAL HIGH (ref 80.0–100.0)
Monocytes Absolute: 0.5 10*3/uL (ref 0.1–1.0)
Monocytes Relative: 5 %
Neutro Abs: 7 10*3/uL (ref 1.7–7.7)
Neutrophils Relative %: 68 %
Platelets: 329 10*3/uL (ref 150–400)
RBC: 1.51 MIL/uL — ABNORMAL LOW (ref 3.87–5.11)
RDW: 14.6 % (ref 11.5–15.5)
WBC: 10.3 10*3/uL (ref 4.0–10.5)
nRBC: 1.4 % — ABNORMAL HIGH (ref 0.0–0.2)

## 2024-02-20 LAB — PROTIME-INR
INR: 1.1 (ref 0.8–1.2)
Prothrombin Time: 14.8 s (ref 11.4–15.2)

## 2024-02-20 LAB — PHOSPHORUS: Phosphorus: 2.7 mg/dL (ref 2.5–4.6)

## 2024-02-20 LAB — IRON AND TIBC
Iron: 152 ug/dL (ref 28–170)
Saturation Ratios: 48 % — ABNORMAL HIGH (ref 10.4–31.8)
TIBC: 318 ug/dL (ref 250–450)
UIBC: 166 ug/dL

## 2024-02-20 LAB — FOLATE: Folate: 16.8 ng/mL (ref >5.9)

## 2024-02-20 LAB — RETICULOCYTES
Immature Retic Fract: 38.8 % — ABNORMAL HIGH (ref 2.3–15.9)
RBC.: 1.41 MIL/uL — ABNORMAL LOW (ref 3.87–5.11)
Retic Count, Absolute: 87.7 10*3/uL (ref 19.0–186.0)
Retic Ct Pct: 6.2 % — ABNORMAL HIGH (ref 0.4–3.1)

## 2024-02-20 LAB — BILIRUBIN, FRACTIONATED(TOT/DIR/INDIR)
Bilirubin, Direct: 0.4 mg/dL — ABNORMAL HIGH (ref 0.0–0.2)
Indirect Bilirubin: 4.5 mg/dL — ABNORMAL HIGH (ref 0.3–0.9)
Total Bilirubin: 4.9 mg/dL — ABNORMAL HIGH (ref 0.0–1.2)

## 2024-02-20 LAB — FERRITIN: Ferritin: 358 ng/mL — ABNORMAL HIGH (ref 11–307)

## 2024-02-20 LAB — CBC
HCT: 16.8 % — ABNORMAL LOW (ref 36.0–46.0)
Hemoglobin: 5.1 g/dL — CL (ref 12.0–15.0)
MCH: 35.4 pg — ABNORMAL HIGH (ref 26.0–34.0)
MCHC: 30.4 g/dL (ref 30.0–36.0)
MCV: 116.7 fL — ABNORMAL HIGH (ref 80.0–100.0)
Platelets: 351 10*3/uL (ref 150–400)
RBC: 1.44 MIL/uL — ABNORMAL LOW (ref 3.87–5.11)
RDW: 14.8 % (ref 11.5–15.5)
WBC: 9.8 10*3/uL (ref 4.0–10.5)
nRBC: 1.7 % — ABNORMAL HIGH (ref 0.0–0.2)

## 2024-02-20 LAB — DIFFERENTIAL
Abs Immature Granulocytes: 0.26 10*3/uL — ABNORMAL HIGH (ref 0.00–0.07)
Basophils Absolute: 0 10*3/uL (ref 0.0–0.1)
Basophils Relative: 0 %
Eosinophils Absolute: 0.1 10*3/uL (ref 0.0–0.5)
Eosinophils Relative: 1 %
Immature Granulocytes: 3 %
Lymphocytes Relative: 15 %
Lymphs Abs: 1.5 10*3/uL (ref 0.7–4.0)
Monocytes Absolute: 0.2 10*3/uL (ref 0.1–1.0)
Monocytes Relative: 2 %
Neutro Abs: 7.8 10*3/uL — ABNORMAL HIGH (ref 1.7–7.7)
Neutrophils Relative %: 79 %

## 2024-02-20 LAB — VITAMIN B12: Vitamin B-12: 268 pg/mL (ref 180–914)

## 2024-02-20 LAB — I-STAT CG4 LACTIC ACID, ED
Lactic Acid, Venous: 2 mmol/L (ref 0.5–1.9)
Lactic Acid, Venous: 1.3 mmol/L (ref 0.5–1.9)

## 2024-02-20 LAB — AMMONIA: Ammonia: 14 umol/L (ref 9–35)

## 2024-02-20 LAB — MRSA NEXT GEN BY PCR, NASAL: MRSA by PCR Next Gen: NOT DETECTED

## 2024-02-20 LAB — CBG MONITORING, ED: Glucose-Capillary: 133 mg/dL — ABNORMAL HIGH (ref 70–99)

## 2024-02-20 LAB — TROPONIN I (HIGH SENSITIVITY)
Troponin I (High Sensitivity): 18 ng/L — ABNORMAL HIGH (ref <18)
Troponin I (High Sensitivity): 19 ng/L — ABNORMAL HIGH (ref <18)

## 2024-02-20 LAB — TSH: TSH: 4.876 u[IU]/mL — ABNORMAL HIGH (ref 0.350–4.500)

## 2024-02-20 LAB — MAGNESIUM: Magnesium: 1.8 mg/dL (ref 1.7–2.4)

## 2024-02-20 LAB — GLUCOSE, CAPILLARY: Glucose-Capillary: 177 mg/dL — ABNORMAL HIGH (ref 70–99)

## 2024-02-20 LAB — LACTATE DEHYDROGENASE: LDH: 348 U/L — ABNORMAL HIGH (ref 98–192)

## 2024-02-20 LAB — LIPASE, BLOOD: Lipase: 32 U/L (ref 11–51)

## 2024-02-20 MED ORDER — PREDNISONE 20 MG PO TABS
80.0000 mg | ORAL_TABLET | Freq: Once | ORAL | Status: AC
  Administered 2024-02-20: 80 mg via ORAL
  Filled 2024-02-20: qty 4

## 2024-02-20 MED ORDER — ONDANSETRON HCL 4 MG/2ML IJ SOLN
4.0000 mg | Freq: Four times a day (QID) | INTRAMUSCULAR | Status: DC | PRN
Start: 2024-02-20 — End: 2024-02-23

## 2024-02-20 MED ORDER — ACETAMINOPHEN 650 MG RE SUPP: 650.0000 mg | Freq: Four times a day (QID) | RECTAL | Status: DC | PRN

## 2024-02-20 MED ORDER — FOLIC ACID 1 MG PO TABS
2.0000 mg | ORAL_TABLET | Freq: Every day | ORAL | Status: DC
  Administered 2024-02-20 – 2024-02-23 (×4): 2 mg via ORAL
  Filled 2024-02-20 (×4): qty 2

## 2024-02-20 MED ORDER — SERTRALINE HCL 100 MG PO TABS
100.0000 mg | ORAL_TABLET | Freq: Every day | ORAL | Status: DC
  Administered 2024-02-21 – 2024-02-23 (×3): 100 mg via ORAL
  Filled 2024-02-20 (×3): qty 1

## 2024-02-20 MED ORDER — DIPHENHYDRAMINE HCL 25 MG PO CAPS
25.0000 mg | ORAL_CAPSULE | Freq: Once | ORAL | Status: AC
  Administered 2024-02-20: 25 mg via ORAL
  Filled 2024-02-20: qty 1

## 2024-02-20 MED ORDER — SODIUM CHLORIDE 0.9% IV SOLUTION: Freq: Once | INTRAVENOUS | Status: AC

## 2024-02-20 MED ORDER — SODIUM CHLORIDE 0.9% IV SOLUTION
Freq: Once | INTRAVENOUS | Status: AC
Start: 2024-02-20 — End: 2024-02-21

## 2024-02-20 MED ORDER — LORAZEPAM 1 MG PO TABS
1.0000 mg | ORAL_TABLET | ORAL | Status: AC | PRN
  Administered 2024-02-21 – 2024-02-22 (×4): 1 mg via ORAL
  Filled 2024-02-20 (×4): qty 1

## 2024-02-20 MED ORDER — IMMUNE GLOBULIN (HUMAN) 10 GM/100ML IV SOLN
1.0000 g/kg | INTRAVENOUS | Status: AC
  Administered 2024-02-20 – 2024-02-21 (×2): 65 g via INTRAVENOUS
  Filled 2024-02-20 (×2): qty 650

## 2024-02-20 MED ORDER — METHOCARBAMOL 500 MG PO TABS
750.0000 mg | ORAL_TABLET | Freq: Two times a day (BID) | ORAL | Status: DC | PRN
  Administered 2024-02-22 (×2): 750 mg via ORAL
  Filled 2024-02-20 (×3): qty 2

## 2024-02-20 MED ORDER — GABAPENTIN 300 MG PO CAPS
300.0000 mg | ORAL_CAPSULE | Freq: Four times a day (QID) | ORAL | Status: DC | PRN
  Administered 2024-02-20 – 2024-02-23 (×8): 300 mg via ORAL
  Filled 2024-02-20 (×8): qty 1

## 2024-02-20 MED ORDER — HYDROCODONE-ACETAMINOPHEN 5-325 MG PO TABS
1.0000 | ORAL_TABLET | Freq: Four times a day (QID) | ORAL | Status: DC | PRN
  Administered 2024-02-20 – 2024-02-22 (×4): 1 via ORAL
  Filled 2024-02-20 (×4): qty 1

## 2024-02-20 MED ORDER — POLYVINYL ALCOHOL 1.4 % OP SOLN: 1.0000 [drp] | Freq: Every day | OPHTHALMIC | Status: DC | PRN

## 2024-02-20 MED ORDER — HYDROMORPHONE HCL 1 MG/ML IJ SOLN
0.5000 mg | Freq: Once | INTRAMUSCULAR | Status: AC
  Administered 2024-02-20: 0.5 mg via INTRAVENOUS
  Filled 2024-02-20: qty 1

## 2024-02-20 MED ORDER — CHLORHEXIDINE GLUCONATE CLOTH 2 % EX PADS
6.0000 | MEDICATED_PAD | Freq: Every day | CUTANEOUS | Status: DC
  Administered 2024-02-21: 6 via TOPICAL

## 2024-02-20 MED ORDER — ONDANSETRON HCL 4 MG PO TABS: 4.0000 mg | ORAL_TABLET | Freq: Four times a day (QID) | ORAL | Status: DC | PRN

## 2024-02-20 MED ORDER — CARBOXYMETHYLCELLULOSE SOD PF 0.5 % OP SOLN: 1.0000 [drp] | Freq: Every day | OPHTHALMIC | Status: DC | PRN

## 2024-02-20 MED ORDER — INSULIN ASPART 100 UNIT/ML IJ SOLN
0.0000 [IU] | Freq: Three times a day (TID) | INTRAMUSCULAR | Status: DC
  Administered 2024-02-21 (×3): 2 [IU] via SUBCUTANEOUS
  Filled 2024-02-20: qty 0.15

## 2024-02-20 MED ORDER — LEVOTHYROXINE SODIUM 112 MCG PO TABS
112.0000 ug | ORAL_TABLET | Freq: Every day | ORAL | Status: DC
  Administered 2024-02-21 – 2024-02-23 (×3): 112 ug via ORAL
  Filled 2024-02-20 (×3): qty 1

## 2024-02-20 MED ORDER — FOLIC ACID 1 MG PO TABS: 1.0000 mg | ORAL_TABLET | Freq: Two times a day (BID) | ORAL | Status: DC

## 2024-02-20 MED ORDER — FUROSEMIDE 10 MG/ML IJ SOLN: 20.0000 mg | Freq: Once | INTRAMUSCULAR | Status: DC

## 2024-02-20 MED ORDER — HYDROCODONE-ACETAMINOPHEN 5-325 MG PO TABS
1.0000 | ORAL_TABLET | Freq: Once | ORAL | Status: AC
  Administered 2024-02-20: 1 via ORAL
  Filled 2024-02-20: qty 1

## 2024-02-20 MED ORDER — SODIUM CHLORIDE 0.9 % IV SOLN: Freq: Once | INTRAVENOUS | Status: AC

## 2024-02-20 MED ORDER — CALCIUM POLYCARBOPHIL 625 MG PO TABS
625.0000 mg | ORAL_TABLET | Freq: Two times a day (BID) | ORAL | Status: DC
  Administered 2024-02-20 – 2024-02-23 (×5): 625 mg via ORAL
  Filled 2024-02-20 (×5): qty 1

## 2024-02-20 MED ORDER — FUROSEMIDE 10 MG/ML IJ SOLN
20.0000 mg | Freq: Once | INTRAMUSCULAR | Status: AC
  Administered 2024-02-21: 20 mg via INTRAVENOUS
  Filled 2024-02-20: qty 2

## 2024-02-20 MED ORDER — ACETAMINOPHEN 325 MG PO TABS
650.0000 mg | ORAL_TABLET | Freq: Once | ORAL | Status: AC
  Administered 2024-02-20: 650 mg via ORAL
  Filled 2024-02-20: qty 2

## 2024-02-20 MED ORDER — INSULIN ASPART 100 UNIT/ML IJ SOLN: 0.0000 [IU] | Freq: Three times a day (TID) | INTRAMUSCULAR | Status: DC

## 2024-02-20 MED ORDER — FAMOTIDINE 20 MG PO TABS
20.0000 mg | ORAL_TABLET | Freq: Two times a day (BID) | ORAL | Status: DC
  Administered 2024-02-20 – 2024-02-23 (×5): 20 mg via ORAL
  Filled 2024-02-20 (×5): qty 1

## 2024-02-20 MED ORDER — ACETAMINOPHEN 325 MG PO TABS
650.0000 mg | ORAL_TABLET | Freq: Four times a day (QID) | ORAL | Status: DC | PRN
  Administered 2024-02-23: 650 mg via ORAL
  Filled 2024-02-20 (×2): qty 2

## 2024-02-20 MED ORDER — PREDNISONE 50 MG PO TABS
80.0000 mg | ORAL_TABLET | Freq: Every day | ORAL | Status: DC
  Administered 2024-02-21 – 2024-02-23 (×3): 80 mg via ORAL
  Filled 2024-02-20 (×2): qty 1
  Filled 2024-02-20: qty 4

## 2024-02-20 NOTE — ED Notes (Signed)
 Called blood bank - blood has antibodies and is not ready at this time.

## 2024-02-20 NOTE — ED Provider Notes (Signed)
 [place] EMERGENCY DEPARTMENT AT [facility] Provider Note   CSN: [id number] Arrival date & time: 02/20/24  1324     History  Chief Complaint  Patient presents with   Dizziness   Headache    [name] is a 78 y.o. female.  HPI  78 yo female with history of CVA 2022, PFO closure in 2022, sleep apnea and hypothyroidism.  Patient reports she has had a headache for about a week.  She reports she has "migraine" headaches.  She reports they are especially bad at weather change and she has considered the recent weather with all the rain to be very bad.  She has been trying to take her regular migraine medications and the rest but the symptoms persisted for a week and by Saturday she had to spend the whole day in bed.  Patient reports the headache is generalized.  She reports by last night she got very dizzy when she was trying to get up and move around.  This morning trying to stand she felt a combination of spinning dizziness and lightheadedness close to passing out.  Patient denies she has had any vomiting.  She was trying to taking a lot more fluids and hydrate.  It was not helping.  Patient denies she has had any abdominal pain.  No lower extremity swelling.  No fever she is documented but reports earlier in the week she might of had a few days where she felt like she was having some hot and cold spells.  Patient works outside in the garden a lot.  She has not found any ticks on herself.  She reports every time she comes in from outside she bathes and does not check.    Home Medications Prior to Admission medications   Medication Sig Start Date End Date Taking? Authorizing Provider  acetaminophen  (TYLENOL ) 500 MG tablet Take 1,000 mg by mouth every 4 (four) hours as needed for headache.    [provider]  Calcium  Carbonate+Vitamin D  600-200 MG-UNIT TABS Take 1 tablet by mouth 2 (two) times daily.    [provider]  Carboxymethylcellulose Sod PF (EQ RESTORE  PLUS LUBRICANT EYE) 0.5 % SOLN Place 1 drop into both eyes daily as needed (Dry eye).    [provider]  clopidogrel  (PLAVIX ) 75 MG tablet Take 75 mg by mouth daily.    [provider]  estradiol (ESTRACE) 0.1 MG/GM vaginal cream Place 1 Applicatorful vaginally every other day. 02/02/24   [provider]  folic acid  (FOLVITE ) 1 MG tablet Take 2 tablets (2 mg total) by mouth daily. Patient taking differently: Take 1 mg by mouth daily. 02/25/21   Barbee Lew, MD  gabapentin  (NEURONTIN ) 300 MG capsule Take 1-2 capsules (300-600 mg total) by mouth 4 (four) times daily as needed (max. 2400mg  daily). 11/14/23   Millikan, Megan, NP  HYDROcodone -acetaminophen  (NORCO/VICODIN) 5-325 MG tablet Take 1 tablet by mouth 2 (two) times daily. 03/30/23   [provider]  hydrocortisone 2.5 % cream Apply 1 Application topically in the morning. On face 11/15/22   [provider]  levothyroxine  (SYNTHROID ) 112 MCG tablet Take 112 mcg by mouth daily before breakfast.    [provider]  LORazepam  (ATIVAN ) 2 MG tablet Take 2 mg by mouth 3 (three) times daily as needed for anxiety.    [provider]  MELATONIN PO Take 5 mg by mouth daily as needed.    [provider]  meloxicam (MOBIC) 15 MG  tablet Take 15 mg by mouth daily at 12 noon. 06/16/23   [provider]  methocarbamol  (ROBAXIN ) 750 MG tablet Take 750 mg by mouth 2 (two) times daily as needed for muscle spasms. 06/10/21   [provider]  Multiple Vitamins-Minerals (MULTIVITAMIN WITH MINERALS) tablet Take 1 tablet by mouth daily. Complete    [provider]  mupirocin ointment (BACTROBAN) 2 % Apply 1 Application topically every evening. 11/15/22   [provider]  nystatin  (MYCOSTATIN /NYSTOP ) powder Apply 1 application topically 3 (three) times daily as needed (irritation).    [provider]  omeprazole (PRILOSEC) 20 MG capsule Take 20 mg by mouth  as needed. 02/11/22   [provider]  polycarbophil (FIBERCON) 625 MG tablet Take 625 mg by mouth in the morning and at bedtime.    [provider]  sertraline  (ZOLOFT ) 100 MG tablet Take 100 mg by mouth daily.    [provider]  sertraline  (ZOLOFT ) 50 MG tablet Take 50 mg by mouth daily.    [provider]  triamcinolone  cream (KENALOG ) 0.1 % 1 application daily as needed (Vaginal irritations).    [provider]      Allergies    Oxycodone, Aspirin , Imipenem, Methocarbamol , Statins, Losartan, Cephalexin, Clindamycin /lincomycin, Contrast media [iodinated contrast media], Losartan potassium-hctz, Methylprednisolone , Penicillins, and Quinolones    Review of Systems   Review of Systems  Physical Exam Updated Vital Signs BP (!) 103/48   Pulse 79   Temp 98.5 F (36.9 C)   Resp 20   SpO2 98%  Physical Exam Constitutional:      Comments: Alert, no respiratory distress, color pale and jaundiced. Well nourished.  HENT:     Mouth/Throat:     Pharynx: Oropharynx is clear.  Eyes:     Extraocular Movements: Extraocular movements intact.  Cardiovascular:     Rate and Rhythm: Normal rate and regular rhythm.  Pulmonary:     Effort: Pulmonary effort is normal.     Breath sounds: Normal breath sounds.  Abdominal:     General: There is no distension.     Palpations: Abdomen is soft. There is no mass.     Tenderness: There is no abdominal tenderness. There is no guarding.  Musculoskeletal:        General: No swelling or tenderness. Normal range of motion.     Cervical back: Neck supple.     Right lower leg: No edema.     Left lower leg: No edema.  Skin:    General: Skin is warm and dry.     Coloration: Skin is jaundiced.  Neurological:     General: No focal deficit present.     Mental Status: She is oriented to person, place, and time.     Motor: No weakness.     Coordination: Coordination normal.  Psychiatric:        Mood and Affect:  Mood normal.     ED Results / Procedures / Treatments   Labs (all labs ordered are listed, but only abnormal results are displayed) Labs Reviewed  COMPREHENSIVE METABOLIC PANEL WITH GFR - Abnormal; Notable for the following components:      Result Value   Sodium 128 (*)    CO2 20 (*)    Glucose, Bld 126 (*)    Calcium  8.1 (*)    Total Bilirubin 5.4 (*)    Anion gap 4 (*)    All other components within normal limits  TSH - Abnormal; Notable for the  following components:   TSH 4.876 (*)    All other components within normal limits  CBC WITH DIFFERENTIAL/PLATELET - Abnormal; Notable for the following components:   RBC 1.51 (*)    Hemoglobin 5.3 (*)    HCT 16.9 (*)    MCV 111.9 (*)    MCH 35.1 (*)    nRBC 1.4 (*)    Abs Immature Granulocytes 0.31 (*)    All other components within normal limits  BILIRUBIN, FRACTIONATED(TOT/DIR/INDIR) - Abnormal; Notable for the following components:   Total Bilirubin 4.9 (*)    Bilirubin, Direct 0.4 (*)    Indirect Bilirubin 4.5 (*)    All other components within normal limits  CBG MONITORING, ED - Abnormal; Notable for the following components:   Glucose-Capillary 133 (*)    All other components within normal limits  I-STAT CG4 LACTIC ACID, ED - Abnormal; Notable for the following components:   Lactic Acid, Venous 2.0 (*)    All other components within normal limits  TROPONIN I (HIGH SENSITIVITY) - Abnormal; Notable for the following components:   Troponin I (High Sensitivity) 18 (*)    All other components within normal limits  AMMONIA  PROTIME-INR  LIPASE, BLOOD  MAGNESIUM   PHOSPHORUS  URINALYSIS, ROUTINE W REFLEX MICROSCOPIC  VITAMIN B12  FOLATE  IRON AND TIBC  FERRITIN  RETICULOCYTES  LACTATE DEHYDROGENASE  I-STAT CG4 LACTIC ACID, ED  TYPE AND SCREEN  TROPONIN I (HIGH SENSITIVITY)    EKG EKG Interpretation Date/Time:  Monday February 20 2024 08:18:22 EDT Ventricular Rate:  96 PR Interval:  167 QRS Duration:  88 QT  Interval:  394 QTC Calculation: 488 R Axis:   7  Text Interpretation: Sinus rhythm PVCs no acute ischemic appearance, no sig change from previous except frequent PVC Confirmed by Wynetta Heckle [phone]) on 02/20/2024 9:20:07 AM  Radiology DG Chest Port 1 View Result Date: 02/20/2024 CLINICAL DATA:  Weakness and shortness of breath. Intermittent headaches and dizziness for the past few days. EXAM: PORTABLE CHEST 1 VIEW COMPARISON:  Radiographs 07/28/2021 and 02/19/2021.  CT 09/11/2010. FINDINGS: 0908 hours. The heart size and mediastinal contours are stable with mild aortic atherosclerosis. The lungs appear clear. No pleural effusion or pneumothorax. No acute osseous findings status post right shoulder reverse arthroplasty. Left shoulder degenerative changes are noted. Telemetry leads overlie the chest. IMPRESSION: No evidence of acute cardiopulmonary process. Aortic atherosclerosis. Electronically Signed   By: Elmon Hagedorn M.D.   On: 02/20/2024 09:58   CT Head Wo Contrast Result Date: 02/20/2024 CLINICAL DATA:  78 year old female with intermittent headache and dizziness for several days. Stopped taking Plavix  late last month prior to a procedure. EXAM: CT HEAD WITHOUT CONTRAST TECHNIQUE: Contiguous axial images were obtained from the base of the skull through the vertex without intravenous contrast. RADIATION DOSE REDUCTION: This exam was performed according to the departmental dose-optimization program which includes automated exposure control, adjustment of the mA and/or kV according to patient size and/or use of iterative reconstruction technique. COMPARISON:  Brain MRI 08/18/2021. FINDINGS: Brain: Multifocal chronic infarcts and cortical encephalomalacia in the anterior left ACA/MCA watershed, posterior right MCA territory, superior left PCA territory are stable from the previous brain MRI. No midline shift, ventriculomegaly, mass effect, evidence of mass lesion, intracranial hemorrhage or evidence of  cortically based acute infarction. Vascular: Calcified atherosclerosis at the skull base. No suspicious intracranial vascular hyperdensity. Skull: Intact, negative. Sinuses/Orbits: Tympanic cavities, visualized paranasal sinuses and mastoids are clear. Other: No acute orbit or scalp soft tissue  finding. IMPRESSION: 1. No acute intracranial abnormality. 2. Stable multifocal chronic cerebral infarcts since a 2022 MRI. Electronically Signed   By: Marlise Simpers M.D.   On: 02/20/2024 09:53    Procedures Procedures   CRITICAL CARE Performed by: Wynetta Heckle   Total critical care time: 40 minutes  Critical care time was exclusive of separately billable procedures and treating other patients.  Critical care was necessary to treat or prevent imminent or life-threatening deterioration.  Critical care was time spent personally by me on the following activities: development of treatment plan with patient and/or surrogate as well as nursing, discussions with consultants, evaluation of patient's response to treatment, examination of patient, obtaining history from patient or surrogate, ordering and performing treatments and interventions, ordering and review of laboratory studies, ordering and review of radiographic studies, pulse oximetry and re-evaluation of patient's condition.  Medications Ordered in ED Medications  0.9 %  sodium chloride  infusion ( Intravenous New Bag/Given 02/20/24 0954)    ED Course/ Medical Decision Making/ A&P                                 Medical Decision Making Amount and/or Complexity of Data Reviewed Labs: ordered. Radiology: ordered.  Risk Prescription drug management. Decision regarding hospitalization.   Patient presents as outlined with indolent headache for a week and dizziness with lightheadedness over the past 2 days.  Patient appears jaundiced.  She does not endorse fever.  Currently vital signs are normal with mild hypotension and no  tachycardia.  Will  proceed with broad diagnostic evaluation to rule out metabolic\infectious etiology.  Hemoglobin 5.3 white count 10.3 troponin 18 GFR greater than 60 sodium 128 lactic 2.0 total bili 5.4.  CT head interpreted by radiology no acute findings.  Stable prior stroke.  Chest x-ray inter by radiology no acute findings.  Patient has significant anemia that is symptomatic with hemoglobin of 5.3.  Findings consistent with hemolytic anemia with total bilirubin 5.3 and normal LFTs.  Will plan for transfusion with symptomatic anemia.  Patient will require admission.  Consult: [facility] hospitalist Dr. Bonita Bussing for admission.          Final Clinical Impression(s) / ED Diagnoses Final diagnoses:  Hemolytic anemia, acute (HCC)  Bad headache  Symptomatic anemia    Rx / DC Orders ED Discharge Orders     None         Wynetta Heckle, MD 02/20/24 1110

## 2024-02-20 NOTE — Consult Note (Addendum)
 [facility]  Telephone:([phone]) [phone] Fax:(336) [phone]    HEMATOLOGY CONSULTATION  PURPOSE OF CONSULTATION/CHIEF COMPLAINT: Autoimmune hemolytic anemia  Referring MD:  Dr. Bonita Bussing   HPI: [name] is a 78 year old female patient who came for evaluation to the ED due to 4-day history of increasing "migraines" with hallucinations.   Medical history significant for autoimmune hemolytic anemia.  Previously followed with hematology Dr. Maria Shiner, however lost to follow up and last seen in September 2022.  Therefore hematology evaluation now requested.  Patient seen awake and alert today in ED. Visitor at bedside.  Patient relates chief complaints as documented above.  Reports ongoing severe headaches at this time. Aware of her low blood counts.   Medical history also significant for history of DVT and stroke.  Surgical history includes eye surgery and multiple foot surgeries. Family history is noncontributory.  Social history-admits to former tobacco use, quit at age 30 over 50+ years ago.     ASSESSMENT AND PLAN:   Autoimmune hemolytic anemia, recurrent Anemia of chronic disease -Hemoglobin low 5.3 today.   --Please transfuse PRBC today, PRBC transfusion order placed.  -- status post Rituxan , completed 03/2021. - Recommend PRBC transfusion for HGB <7.0 -- For IVIG 1g/kg every 24 hrs x2 doses, start 02/20/24. -- Start prednisone  daily, begin 02/20/24 - Hematology/Dr. Maria Shiner following.  Patient lost to follow up since 06/17/21.  Dr. Maria Shiner will make further evaluation and treatment recommendations.  Headache, worsening Dizziness -- secondary to AIHA -- continue pain meds as ordered  Hyperbilirubinemia --noted elevated bilirubin 5.4 --avoid hepatotoxic agents --continue IVF as ordered --monitor CMP closely  History of CVA -- in 2022 -- may have been due to severe anemia  History of DVT -- s/p Xarelto  x6 mos     Past Medical History:  Diagnosis Date    Anxiety    Arthritis    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   Depression    DVT (deep venous thrombosis) (HCC)    acute right peroneal DVT & age indeterminant left popliteal DVT 4//1922, s/p Xarelto  x 6 months   Fungal infection    around lips started 12-15-2022 on fluconazole  for   GERD (gastroesophageal reflux disease)    Hypothyroidism    melanoma right upper thigh 2001   MIGRAINE HEADACHE    Neuromuscular disorder (HCC)    condition where muscle separates from bone left chest, quarter size, pain intermittent   Neuropathy    feet   PFO (patent foramen ovale)    closure 09/17/21   Sleep apnea    does not use CPAP   Stress incontinence    Stroke (HCC) 01/05/2021   Wears glasses   :  Past Surgical History:  Procedure Laterality Date   BALLOON DILATION N/A 09/18/2020   Procedure: BALLOON DILATION;  Surgeon: Genell Ken, MD;  Location: [facility];  Service: Gastroenterology;  Laterality: N/A;   bilateral bunion removal     yrs ago   BUBBLE STUDY  07/03/2021   Procedure: BUBBLE STUDY;  Surgeon: Lenise Quince, MD;  Location: [facility]COPY;  Service: Cardiovascular;;   CYSTOSCOPY WITH INJECTION N/A 01/11/2023   Procedure: CYSTOSCOPY WITH INJECTION OF [place];  Surgeon: Roxane Copp, MD;  Location: [facility] [place];  Service: Urology;  Laterality: N/A;  45 MINS   DRUG INDUCED ENDOSCOPY Bilateral 07/28/2022   Procedure: DRUG INDUCED ENDOSCOPY;  Surgeon: Virgina Grills, MD;  Location: [facility];  Service: ENT;  Laterality: Bilateral;   ESOPHAGOGASTRODUODENOSCOPY (EGD) WITH  PROPOFOL  N/A 09/18/2020   Procedure: ESOPHAGOGASTRODUODENOSCOPY (EGD) WITH PROPOFOL ;  Surgeon: Genell Ken, MD;  Location: [facility];  Service: Gastroenterology;  Laterality: N/A;   EYE SURGERY Bilateral    Cataract L eye 1-20, R eye 3-20    FOOT ARTHRODESIS Left 11/19/2022   Procedure: ARTHRODESIS FOOT WITH BONE GRAFT;  Surgeon: Floyce Hutching, DPM;  Location: [facility];   Service: Podiatry;  Laterality: Left;   FOREIGN BODY REMOVAL  09/18/2020   Procedure: FOREIGN BODY REMOVAL;  Surgeon: Genell Ken, MD;  Location: [facility];  Service: Gastroenterology;;   Berton Brock FUSION Left 11/19/2022   Procedure: HALLUX METATARSAL PHALANGEAL JOINT FUSION;  Surgeon: Floyce Hutching, DPM;  Location: [facility];  Service: Podiatry;  Laterality: Left;   HAMMER TOE SURGERY Left 11/19/2022   Procedure: HAMMER TOE CORRECTION SECOND/THIRD;  Surgeon: Floyce Hutching, DPM;  Location: [facility];  Service: Podiatry;  Laterality: Left;   METATARSAL OSTEOTOMY Left 11/19/2022   Procedure: METATARSAL OSTEOTOMY;  Surgeon: Floyce Hutching, DPM;  Location: [facility];  Service: Podiatry;  Laterality: Left;  BLOCK   MOHS SURGERY  2001   upper right thigh   PATENT FORAMEN OVALE(PFO) CLOSURE N/A 08/28/2021   Procedure: PATENT FORAMEN OVALE (PFO) CLOSURE;  Surgeon: Arnoldo Lapping, MD;  Location: [facility] INVASIVE CV LAB;  Service: Cardiovascular;  Laterality: N/A;   REVERSE SHOULDER ARTHROPLASTY Right 03/16/2019   Procedure: REVERSE SHOULDER ARTHROPLASTY;  Surgeon: Winston Hawking, MD;  Location: [facility];  Service: Orthopedics;  Laterality: Right;  interscalene block   TEE WITHOUT CARDIOVERSION N/A 07/03/2021   Procedure: TRANSESOPHAGEAL ECHOCARDIOGRAM (TEE);  Surgeon: Lenise Quince, MD;  Location: [facility] ENDOSCOPY;  Service: Cardiovascular;  Laterality: N/A;   TUBAL LIGATION     yrs ago  :  Allergies  Allergen Reactions   Oxycodone Other (See Comments)    Hallucinations    Aspirin  Other (See Comments)    Interferes with headaches  Other Reaction(s): stomach issues   Imipenem Other (See Comments)    Warm and tingly all over   Methocarbamol  Rash   Statins Other (See Comments)    leg pain, muscle cramps   Losartan Other (See Comments)    Pt unsure   Cephalexin Other (See Comments)    Stomach cramps   Clindamycin /Lincomycin Nausea And Vomiting and Rash   Contrast Media [Iodinated Contrast Media] Rash    Losartan Potassium-Hctz Other (See Comments)    Unknown Reaction - pt is unaware of this  Other Reaction(s): do not remember   Methylprednisolone  Rash    Rash on face   Penicillins Rash    Did it involve swelling of the face/tongue/throat, SOB, or low BP? No Did it involve sudden or severe rash/hives, skin peeling, or any reaction on the inside of your mouth or nose? Yes Did you need to seek medical attention at a hospital or doctor's office? No When did it last happen?      2011 If all above answers are "NO", may proceed with cephalosporin use.    Quinolones Rash  :   Family History  Problem Relation Age of Onset   Heart disease Mother    Stroke Father    Arthritis Sister    Diabetes Brother    Arthritis Brother    Neuropathy Brother    Neuropathy Sister    Diabetes Brother   :   Social History   Socioeconomic History   Marital status: Married    Spouse name: Not on file   Number of  children: Not on file   Years of education: Not on file   Highest education level: Not on file  Occupational History   Not on file  Tobacco Use   Smoking status: Former    Current packs/day: 0.00    Types: Cigarettes    Quit date: 20    Years since quitting: 56.4   Smokeless tobacco: Never  Vaping Use   Vaping status: Never Used  Substance and Sexual Activity   Alcohol use: No   Drug use: No   Sexual activity: Not Currently    Birth control/protection: Post-menopausal  Other Topics Concern   Not on file  Social History Narrative   Lives at home with husband (has mild Dementia) of 55 years   Right handed    Caffeine: 1 cup tea/day   Social Drivers of Corporate investment banker Strain: Not on file  Food Insecurity: Not on file  Transportation Needs: Not on file  Physical Activity: Not on file  Stress: Not on file  Social Connections: Not on file  Intimate Partner Violence: Not on file  :   CURRENT MEDS: Current Facility-Administered Medications  Medication  Dose Route Frequency Provider Last Rate Last Admin   0.9 %  sodium chloride  infusion (Manually program via Guardrails IV Fluids)   Intravenous Once Wynetta Heckle, MD       0.9 %  sodium chloride  infusion (Manually program via Guardrails IV Fluids)   Intravenous Once Ovella Manygoats R, MD       acetaminophen  (TYLENOL ) tablet 650 mg  650 mg Oral Q6H PRN Danice Dural, MD       Or   acetaminophen  (TYLENOL ) suppository 650 mg  650 mg Rectal Q6H PRN Danice Dural, MD       acetaminophen  (TYLENOL ) tablet 650 mg  650 mg Oral Once Jatorian Renault R, MD       diphenhydrAMINE  (BENADRYL ) capsule 25 mg  25 mg Oral Once Ivor Mars, MD       famotidine  (PEPCID ) tablet 40 mg  40 mg Oral BID Getsemani Lindon R, MD       folic acid  (FOLVITE ) tablet 2 mg  2 mg Oral Daily Javel Hersh, Sherryll Donald, MD       furosemide (LASIX) injection 20 mg  20 mg Intravenous Once Nevaen Tredway R, MD       furosemide (LASIX) injection 20 mg  20 mg Intravenous Once Caydin Yeatts R, MD       Immune Globulin  10% (PRIVIGEN ) IV infusion 1 g/kg  1 g/kg Intravenous Q24 Hr x 2 Michel Eskelson, Sherryll Donald, MD       ondansetron  (ZOFRAN ) tablet 4 mg  4 mg Oral Q6H PRN Danice Dural, MD       Or   ondansetron  (ZOFRAN ) injection 4 mg  4 mg Intravenous Q6H PRN Danice Dural, MD       [START ON 02/21/2024] predniSONE  (DELTASONE ) tablet 80 mg  80 mg Oral Q breakfast Ivor Mars, MD       Current Outpatient Medications  Medication Sig Dispense Refill   clopidogrel  (PLAVIX ) 75 MG tablet Take 75 mg by mouth daily.     gabapentin  (NEURONTIN ) 300 MG capsule Take 1-2 capsules (300-600 mg total) by mouth 4 (four) times daily as needed (max. 2400mg  daily). 720 capsule 2   HYDROcodone -acetaminophen  (NORCO/VICODIN) 5-325 MG tablet Take 1 tablet by mouth 2 (two) times daily.     levothyroxine  (SYNTHROID ) 112 MCG tablet Take 112 mcg  by mouth daily before breakfast.     LORazepam  (ATIVAN ) 2 MG tablet Take 2 mg by mouth 3 (three) times daily  as needed for anxiety.     methocarbamol  (ROBAXIN ) 750 MG tablet Take 750 mg by mouth 2 (two) times daily as needed for muscle spasms.     acetaminophen  (TYLENOL ) 500 MG tablet Take 1,000 mg by mouth every 4 (four) hours as needed for headache.     Calcium  Carbonate+Vitamin D  600-200 MG-UNIT TABS Take 1 tablet by mouth 2 (two) times daily.     Carboxymethylcellulose Sod PF (EQ RESTORE PLUS LUBRICANT EYE) 0.5 % SOLN Place 1 drop into both eyes daily as needed (Dry eye).     estradiol (ESTRACE) 0.1 MG/GM vaginal cream Place 1 Applicatorful vaginally every other day.     folic acid  (FOLVITE ) 1 MG tablet Take 2 tablets (2 mg total) by mouth daily. (Patient taking differently: Take 1 mg by mouth daily.)     hydrocortisone 2.5 % cream Apply 1 Application topically in the morning. On face     MELATONIN PO Take 5 mg by mouth daily as needed.     meloxicam (MOBIC) 15 MG tablet Take 15 mg by mouth daily at 12 noon.     Multiple Vitamins-Minerals (MULTIVITAMIN WITH MINERALS) tablet Take 1 tablet by mouth daily. Complete     mupirocin ointment (BACTROBAN) 2 % Apply 1 Application topically every evening.     nystatin  (MYCOSTATIN /NYSTOP ) powder Apply 1 application topically 3 (three) times daily as needed (irritation).     omeprazole (PRILOSEC) 20 MG capsule Take 20 mg by mouth as needed.     polycarbophil (FIBERCON) 625 MG tablet Take 625 mg by mouth in the morning and at bedtime.     sertraline  (ZOLOFT ) 100 MG tablet Take 100 mg by mouth daily.     triamcinolone  cream (KENALOG ) 0.1 % 1 application daily as needed (Vaginal irritations).      PHYSICAL EXAMINATION: ECOG PERFORMANCE STATUS: 3 - Symptomatic, >50% confined to bed  Vitals:   02/20/24 0915 02/20/24 1300  BP: (!) 103/48   Pulse: 79   Resp: 20   Temp:  98.5 F (36.9 C)  SpO2: 98%    Filed Weights   02/20/24 1219  Weight: 163 lb (73.9 kg)    GENERAL: alert, no distress and comfortable SKIN: +pale skin color, texture, turgor are normal,  no rashes or significant lesions EYES: normal, conjunctiva are pink and non-injected, sclera clear OROPHARYNX: no exudate, no erythema and lips, buccal mucosa, and tongue normal  NECK: supple, thyroid  normal size, non-tender, without nodularity LYMPH: no palpable lymphadenopathy in the cervical, axillary or inguinal LUNGS: clear to auscultation and percussion with normal breathing effort HEART: regular rate & rhythm and no murmurs and no lower extremity edema ABDOMEN: abdomen soft, non-tender and normal bowel sounds MUSCULOSKELETAL: no cyanosis of digits and no clubbing  PSYCH: alert & oriented x 3 with fluent speech NEURO: no focal motor/sensory deficits   LABS: Lab Results  Component Value Date   WBC 10.3 02/20/2024   HGB 5.3 (LL) 02/20/2024   HCT 16.9 (L) 02/20/2024   MCV 111.9 (H) 02/20/2024   PLT 329 02/20/2024    Lab Results  Component Value Date   WBC 10.3 02/20/2024   HGB 5.3 (LL) 02/20/2024   HCT 16.9 (L) 02/20/2024   PLT 329 02/20/2024   GLUCOSE 126 (H) 02/20/2024   CHOL 158 01/05/2021   TRIG 70 01/05/2021   HDL 44 01/05/2021  LDLCALC 100 (H) 01/05/2021   ALT 14 02/20/2024   AST 29 02/20/2024   NA 128 (L) 02/20/2024   K 3.7 02/20/2024   CL 104 02/20/2024   CREATININE 0.57 02/20/2024   BUN 15 02/20/2024   CO2 20 (L) 02/20/2024   INR 1.1 02/20/2024   HGBA1C 5.8 (H) 02/24/2022    DG Chest Port 1 View Result Date: 02/20/2024 CLINICAL DATA:  Weakness and shortness of breath. Intermittent headaches and dizziness for the past few days. EXAM: PORTABLE CHEST 1 VIEW COMPARISON:  Radiographs 07/28/2021 and 02/19/2021.  CT 09/11/2010. FINDINGS: 0908 hours. The heart size and mediastinal contours are stable with mild aortic atherosclerosis. The lungs appear clear. No pleural effusion or pneumothorax. No acute osseous findings status post right shoulder reverse arthroplasty. Left shoulder degenerative changes are noted. Telemetry leads overlie the chest. IMPRESSION: No  evidence of acute cardiopulmonary process. Aortic atherosclerosis. Electronically Signed   By: Elmon Hagedorn M.D.   On: 02/20/2024 09:58   CT Head Wo Contrast Result Date: 02/20/2024 CLINICAL DATA:  78 year old female with intermittent headache and dizziness for several days. Stopped taking Plavix  late last month prior to a procedure. EXAM: CT HEAD WITHOUT CONTRAST TECHNIQUE: Contiguous axial images were obtained from the base of the skull through the vertex without intravenous contrast. RADIATION DOSE REDUCTION: This exam was performed according to the departmental dose-optimization program which includes automated exposure control, adjustment of the mA and/or kV according to patient size and/or use of iterative reconstruction technique. COMPARISON:  Brain MRI 08/18/2021. FINDINGS: Brain: Multifocal chronic infarcts and cortical encephalomalacia in the anterior left ACA/MCA watershed, posterior right MCA territory, superior left PCA territory are stable from the previous brain MRI. No midline shift, ventriculomegaly, mass effect, evidence of mass lesion, intracranial hemorrhage or evidence of cortically based acute infarction. Vascular: Calcified atherosclerosis at the skull base. No suspicious intracranial vascular hyperdensity. Skull: Intact, negative. Sinuses/Orbits: Tympanic cavities, visualized paranasal sinuses and mastoids are clear. Other: No acute orbit or scalp soft tissue finding. IMPRESSION: 1. No acute intracranial abnormality. 2. Stable multifocal chronic cerebral infarcts since a 2022 MRI. Electronically Signed   By: Marlise Simpers M.D.   On: 02/20/2024 09:53     The total time spent in the appointment was 40 minutes encounter with patients including review of chart and various tests results, discussions about plan of care and coordination of care plan   All questions were answered. The patient knows to call the clinic with any problems, questions or concerns. No barriers to learning was  detected.  Thank you for the courtesy of this consultation, Jacqualin Mate, NP  6/2/20251:13 PM   ADDENDUM: Unfortunately, Ms. Folden has a relapse of her autoimmune hemolytic anemia.  Is been over 3 years that she had her initial episode of warm autoimmune hemolytic anemia.  At that time, she was treated with steroids, IVIG, and Rituxan ..  She had a bone marrow biopsy that was done at that time that did not show any evidence of a lymphoproliferative process.  She says that she did well over Memorial Day weekend.  However, this past weekend she began to feel tired.  She had headaches.  She subsequently came to the hospital yesterday and was admitted with a hemoglobin of 5.3.  She has indirect hyperbilirubinemia.  Her LDH is elevated.  Her reticulocyte count is up.  Everything points to this anemia secondary to relapse of the warm autoimmune hemolysis..  I did look at her blood smear.  She does have  some nucleated red blood cells.  I do not see any rouleaux formation.  I do not see any immature myeloid cells.  Platelets were adequate.  She unfortunately had a reaction to least incompatible blood.  We are going to have to transfuse her.  Today, her white cell count is 11.3.  Hemoglobin 4.7.  Platelet count 293,000.  MCV is 122.  Reticulocyte count 7.2%.  LDH is 328.  Total bilirubin 1.8.  She has not yet gotten the IVIG.  Again, we are going to have to transfuse her.  We can have to give her least incompatible blood again.  At some point, she is probably going to have another CT ultrasound of the abdomen to look at her spleen.  1 treatment option for her would be to have her spleen taken out.  We need to try to stabilize her.  She is on folic acid .  I am not sure with the trigger was for this immune hemolytic anemia.  Again I cannot find anything that looks like a lymphoproliferative process.  We are have to watch her very closely.  I know she will get incredible care from all the staff  down the ICU.   [name], MD  Royston Cornea 1:5

## 2024-02-20 NOTE — ED Notes (Signed)
 Pt was assisted in using bedside commode

## 2024-02-20 NOTE — ED Notes (Signed)
 Called pharmacy at [facility]  order pharmacy tech states pharmacy was waiting for patient hit the floor to verify order. Explained to pharmacy this patient has had admission orders for a little while and we don't know when she will get a room but this order needs to be verified.

## 2024-02-20 NOTE — ED Notes (Signed)
 ED TO INPATIENT HANDOFF REPORT  Name/Age/Gender [name] 78 y.o. female  Code Status    Code Status Orders  (From admission, onward)           Start     Ordered   02/20/24 1125  Full code  Continuous       Question:  By:  Answer:  Consent: discussion documented in EHR   02/20/24 1125           Code Status History     Date Active Date Inactive Code Status Order ID Comments User Context   08/28/2021 0855 08/28/2021 1756 Full Code [id number]  Arnoldo Lapping, MD Inpatient   02/19/2021 1512 02/25/2021 1849 Full Code [id number]  Karalee Oscar, MD ED   01/02/2021 1516 01/12/2021 1932 Full Code [id number]  Wilfred Hanlon, DO Inpatient   03/16/2019 1015 03/17/2019 1420 Full Code [id number]  Winston Hawking, MD Inpatient      Advance Directive Documentation    Flowsheet Row Most Recent Value  Type of Advance Directive Healthcare Power of Attorney, Living will  Pre-existing out of facility DNR order (yellow form or pink MOST form) --  "MOST" Form in Place? --       Home/SNF/Other Home  Chief Complaint Hemolytic anemia (HCC) [D58.9]  Level of Care/Admitting Diagnosis ED Disposition     ED Disposition  Admit   Condition  --   Comment  Hospital Area: [facility] [[id number]]  Level of Care: Stepdown [14]  Admit to SDU based on following criteria: Severe physiological/psychological symptoms:  Any diagnosis requiring assessment & intervention at least every 4 hours on an ongoing basis to obtain desired patient outcomes including stability and rehabilitation  May admit patient to [facility] Ben[facility] or [facility] Smalling if equivalent level of care is available:: No  Covid Evaluation: Symptomatic Person Under Investigation (PUI) or recent exposure (last 10 days) *Testing Required*  Diagnosis: Hemolytic anemia (HCC) [629528]  Admitting Physician: Danice Dural [[id number]]  Attending Physician: Danice Dural [[id number]]  Certification:: I certify this patient will  need inpatient services for at least 2 midnights  Expected Medical Readiness: 02/22/2024          Medical History 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   Depression    DVT (deep venous thrombosis) (HCC)    acute right peroneal DVT & age indeterminant left popliteal DVT 4//1922, s/p Xarelto  x 6 months   Fungal infection    around lips started 12-15-2022 on fluconazole  for   GERD (gastroesophageal reflux disease)    Hypothyroidism    melanoma right upper thigh 2001   MIGRAINE HEADACHE    Neuromuscular disorder (HCC)    condition where muscle separates from bone left chest, quarter size, pain intermittent   Neuropathy    feet   PFO (patent foramen ovale)    closure 09/17/21   Sleep apnea    does not use CPAP   Stress incontinence    Stroke (HCC) 01/05/2021   Wears glasses     Allergies Allergies  Allergen Reactions   Oxycodone Other (See Comments)    Hallucinations    Aspirin  Other (See Comments)    Interferes with headaches  Other Reaction(s): stomach issues   Imipenem Other (See Comments)    Warm and tingly all over   Methocarbamol  Rash   Statins Other (See Comments)    leg pain, muscle cramps  Losartan Other (See Comments)    Pt unsure   Zosyn [Piperacillin-Tazobactam In Dex]    Cephalexin Other (See Comments)    Stomach cramps   Clindamycin /Lincomycin Nausea And Vomiting and Rash   Contrast Media [Iodinated Contrast Media] Rash   Losartan Potassium-Hctz Other (See Comments)    Unknown Reaction - pt is unaware of this  Other Reaction(s): do not remember   Methylprednisolone  Rash    Rash on face   Penicillins Rash        Quinolones Rash    IV Location/Drains/Wounds Patient Lines/Drains/Airways Status     Active Line/Drains/Airways     Name Placement date Placement time Site Days   Peripheral IV 02/20/24 18 G Right Antecubital 02/20/24  0906  Antecubital  less than 1    Peripheral IV 02/20/24 20 G Anterior;Distal;Left Forearm 02/20/24  --  Forearm  less than 1            Labs/Imaging Results for orders placed or performed during the hospital encounter of 02/20/24 (from the past 48 hours)  CBG monitoring, ED     Status: Abnormal   Collection Time: 02/20/24  8:17 AM  Result Value Ref Range   Glucose-Capillary 133 (H) 70 - 99 mg/dL    Comment: Glucose reference range applies only to samples taken after fasting for at least 8 hours.  [facility] screen  [facility]     Status: None (Preliminary result)   Collection Time: 02/20/24  8:30 AM  Result Value Ref Range   ABO/RH(D) A NEG    Antibody Screen POS    Sample Expiration 02/23/2024,2359    Antibody Identification WARM AUTOANTIBODY    DAT, IgG POS    Antibody ID,T [place] WARM AUTOANTIBODY    Unit Number [id number]    Blood Component Type RED CELLS,LR    Unit division 00    Status of Unit ISSUED    Donor AG Type      NEGATIVE FOR C ANTIGEN NEGATIVE FOR E ANTIGEN NEGATIVE FOR KELL ANTIGEN NEGATIVE FOR DUFFY A ANTIGEN NEGATIVE FOR KIDD A ANTIGEN   Unit tag comment VERBAL ORDERS PER DR APPROVED PER DR. PATRICK    Transfusion Status OK TO TRANSFUSE    Crossmatch Result LEAST INCOMPATIBLE    Unit Number [id number]    Blood Component Type RED CELLS,LR    Unit division 00    Status of Unit ALLOCATED    Donor AG Type      NEGATIVE FOR C ANTIGEN NEGATIVE FOR E ANTIGEN NEGATIVE FOR KELL ANTIGEN NEGATIVE FOR DUFFY A ANTIGEN NEGATIVE FOR KIDD A ANTIGEN   Unit tag comment VERBAL ORDERS PER DR    Transfusion Status OK TO TRANSFUSE    Crossmatch Result LEAST INCOMPATIBLE   Protime-INR     Status: None   Collection Time: 02/20/24  8:30 AM  Result Value Ref Range   Prothrombin Time 14.8 11.4 - 15.2 seconds   INR 1.1 0.8 - 1.2    Comment: (NOTE) INR goal varies based on device and disease states. Performed at [facility], [address], [place], [place]  [address]   Lipase, blood     Status: None   Collection Time: 02/20/24  8:30 AM  Result Value Ref Range   Lipase 32 11 - 51 U/L    Comment: Performed at [facility], [address] [place], [place] [address]  Troponin I (High Sensitivity)     Status: Abnormal   Collection Time:  02/20/24  8:30 AM  Result Value Ref Range   Troponin I (High Sensitivity) 18 (H) <18 ng/L    Comment: (NOTE) Elevated high sensitivity troponin I (hsTnI) values and significant  changes across serial measurements may suggest ACS but many other  chronic and acute conditions are known to elevate hsTnI results.  Refer to the "Links" section for chest pain algorithms and additional  guidance. Performed at [facility], [address], [place], [place] [address]   Magnesium      Status: None   Collection Time: 02/20/24  8:30 AM  Result Value Ref Range   Magnesium  1.8 1.7 - 2.4 mg/dL    Comment: Performed at [facility], [address] [place], [place] [address]  Phosphorus     Status: None   Collection Time: 02/20/24  8:30 AM  Result Value Ref Range   Phosphorus 2.7 2.5 - 4.6 mg/dL    Comment: Performed at [facility], [address], [place], [place] [address]  CBC with Differential     Status: Abnormal   Collection Time: 02/20/24  8:30 AM  Result Value Ref Range   WBC 10.3 4.0 - 10.5 K/uL   RBC 1.51 (L) 3.87 - 5.11 MIL/uL   Hemoglobin 5.3 (LL) 12.0 - 15.0 g/dL    Comment: This critical result has verified and been called to BURGES, A. RN by [facility] on 06 02 2025 at 0948, and has been read back. Repeated to Verify   HCT 16.9 (L) 36.0 - 46.0 %   MCV 111.9 (H) 80.0 - 100.0 fL   MCH 35.1 (H) 26.0 - 34.0 pg   MCHC 31.4 30.0 - 36.0 g/dL   RDW 78.2 95.6 - 21.3 %   Platelets 329 150 - 400 K/uL   nRBC 1.4 (H) 0.0 - 0.2 %   Neutrophils Relative % 68 %   Neutro Abs 7.0 1.7 - 7.7 K/uL   Lymphocytes Relative 23 %   Lymphs Abs 2.3  0.7 - 4.0 K/uL   Monocytes Relative 5 %   Monocytes Absolute 0.5 0.1 - 1.0 K/uL   Eosinophils Relative 1 %   Eosinophils Absolute 0.1 0.0 - 0.5 K/uL   Basophils Relative 0 %   Basophils Absolute 0.0 0.0 - 0.1 K/uL   WBC Morphology MORPHOLOGY UNREMARKABLE    Immature Granulocytes 3 %   Abs Immature Granulocytes 0.31 (H) 0.00 - 0.07 K/uL   Reactive, Benign Lymphocytes PRESENT    Polychromasia PRESENT     Comment: Performed at [facility], [address], [place], [place] [address]  Comprehensive metabolic panel     Status: Abnormal   Collection Time: 02/20/24  8:50 AM  Result Value Ref Range   Sodium 128 (L) 135 - 145 mmol/L   Potassium 3.7 3.5 - 5.1 mmol/L   Chloride 104 98 - 111 mmol/L   CO2 20 (L) 22 - 32 mmol/L   Glucose, Bld 126 (H) 70 - 99 mg/dL    Comment: Glucose reference range applies only to samples taken after fasting for at least 8 hours.   BUN 15 8 - 23 mg/dL   Creatinine, Ser 8.46 0.44 - 1.00 mg/dL   Calcium  8.1 (L) 8.9 - 10.3 mg/dL   Total Protein 6.5 6.5 - 8.1 g/dL   Albumin 3.9 3.5 - 5.0 g/dL   AST 29 15 - 41 U/L   ALT 14 0 - 44 U/L   Alkaline Phosphatase 59 38 - 126  U/L   Total Bilirubin 5.4 (H) 0.0 - 1.2 mg/dL   GFR, Estimated >16 >10 mL/min    Comment: (NOTE) Calculated using the CKD-EPI Creatinine Equation (2021)    Anion gap 4 (L) 5 - 15    Comment: Performed at [facility], [address] [place], [place] [address]  Ammonia     Status: None   Collection Time: 02/20/24  8:51 AM  Result Value Ref Range   Ammonia 14 9 - 35 umol/L    Comment: Performed at [facility], [address] [place], [place] [address]  TSH     Status: Abnormal   Collection Time: 02/20/24  8:53 AM  Result Value Ref Range   TSH 4.876 (H) 0.350 - 4.500 uIU/mL    Comment: Performed by a 3rd Generation assay with a functional sensitivity of <=0.01 uIU/mL. Performed at [facility], [address]  [place], [place] [address]   Bilirubin, fractionated(tot/dir/indir)     Status: Abnormal   Collection Time: 02/20/24  8:54 AM  Result Value Ref Range   Total Bilirubin 4.9 (H) 0.0 - 1.2 mg/dL   Bilirubin, Direct 0.4 (H) 0.0 - 0.2 mg/dL   Indirect Bilirubin 4.5 (H) 0.3 - 0.9 mg/dL    Comment: Performed at [facility], [address], [place], [place] [address]  I-Stat Lactic Acid     Status: Abnormal   Collection Time: 02/20/24  9:09 AM  Result Value Ref Range   Lactic Acid, Venous 2.0 (HH) 0.5 - 1.9 mmol/L   Comment NOTIFIED PHYSICIAN   Vitamin B12     Status: None   Collection Time: 02/20/24 11:00 AM  Result Value Ref Range   Vitamin B-12 268 180 - 914 pg/mL    Comment: (NOTE) This assay is not validated for testing neonatal or myeloproliferative syndrome specimens for Vitamin B12 levels. Performed at [facility], [address], [place], [place] [address]   Folate     Status: None   Collection Time: 02/20/24 11:00 AM  Result Value Ref Range   Folate 16.8 >5.9 ng/mL    Comment: Performed at [facility], [address], [place], [place] [address]  Iron and TIBC     Status: Abnormal   Collection Time: 02/20/24 11:00 AM  Result Value Ref Range   Iron 152 28 - 170 ug/dL   TIBC 696 295 - 284 ug/dL   Saturation Ratios 48 (H) 10.4 - 31.8 %   UIBC 166 ug/dL    Comment: Performed at [facility], [address], [place], [place] [address]  Ferritin     Status: Abnormal   Collection Time: 02/20/24 11:00 AM  Result Value Ref Range   Ferritin 358 (H) 11 - 307 ng/mL    Comment: ICTERUS AT THIS LEVEL MAY AFFECT RESULT ICTERUS AT THIS LEVEL MAY AFFECT RESULT Performed at [facility], [address], [place], [place] [address]   Reticulocytes     Status: Abnormal   Collection Time: 02/20/24 11:00 AM  Result Value Ref Range   Retic Ct Pct 6.2 (H) 0.4 - 3.1 %   RBC. 1.41 (L) 3.87 - 5.11 MIL/uL    Retic Count, Absolute 87.7 19.0 - 186.0 K/uL   Immature Retic Fract 38.8 (H) 2.3 - 15.9 %    Comment: Performed at [facility], [address], [place], [place] [address]  Troponin I (High Sensitivity)  Status: Abnormal   Collection Time: 02/20/24 11:00 AM  Result Value Ref Range   Troponin I (High Sensitivity) 19 (H) <18 ng/L    Comment: (NOTE) Elevated high sensitivity troponin I (hsTnI) values and significant  changes across serial measurements may suggest ACS but many other  chronic and acute conditions are known to elevate hsTnI results.  Refer to the "Links" section for chest pain algorithms and additional  guidance. Performed at [facility], [address], [place], [place] [address]   Lactate dehydrogenase     Status: Abnormal   Collection Time: 02/20/24 11:00 AM  Result Value Ref Range   LDH 348 (H) 98 - 192 U/L    Comment: Performed at [facility], [address] [place], [place] [address]  Urinalysis, Routine w reflex microscopic -Urine, Clean Catch     Status: Abnormal   Collection Time: 02/20/24 11:03 AM  Result Value Ref Range   Color, Urine YELLOW YELLOW   APPearance CLEAR CLEAR   Specific Gravity, Urine 1.010 1.005 - 1.030   pH 5.0 5.0 - 8.0   Glucose, UA NEGATIVE NEGATIVE mg/dL   Hgb urine dipstick NEGATIVE NEGATIVE   Bilirubin Urine NEGATIVE NEGATIVE   Ketones, ur NEGATIVE NEGATIVE mg/dL   Protein, ur NEGATIVE NEGATIVE mg/dL   Nitrite NEGATIVE NEGATIVE   Leukocytes,Ua TRACE (A) NEGATIVE   RBC / HPF 0-5 0 - 5 RBC/hpf   WBC, UA 6-10 0 - 5 WBC/hpf   Bacteria, UA NONE SEEN NONE SEEN   Squamous Epithelial / HPF 0-5 0 - 5 /HPF   Mucus PRESENT    Hyaline Casts, UA PRESENT     Comment: Performed at [facility], [address], [place], [place] [address]  I-Stat Lactic Acid     Status: None   Collection Time: 02/20/24 11:17 AM  Result Value Ref Range   Lactic Acid, Venous 1.3  0.5 - 1.9 mmol/L  Prepare RBC (crossmatch)     Status: None   Collection Time: 02/20/24 12:02 PM  Result Value Ref Range   Order Confirmation      ORDER PROCESSED BY BLOOD BANK Performed at [facility], [address], [place], [place] [address]   CBC     Status: Abnormal   Collection Time: 02/20/24  6:48 PM  Result Value Ref Range   WBC 9.8 4.0 - 10.5 K/uL   RBC 1.44 (L) 3.87 - 5.11 MIL/uL   Hemoglobin 5.1 (LL) 12.0 - 15.0 g/dL    Comment: REPEATED TO VERIFY THIS CRITICAL RESULT HAS VERIFIED AND BEEN CALLED TO D.Joleene Burnham, EMTP BY NATHAN THOMPSON ON 06 02 2025 AT 1923, AND HAS BEEN READ BACK. CRITICAL RESULT VERIFIED    HCT 16.8 (L) 36.0 - 46.0 %   MCV 116.7 (H) 80.0 - 100.0 fL   MCH 35.4 (H) 26.0 - 34.0 pg   MCHC 30.4 30.0 - 36.0 g/dL   RDW 30.8 65.7 - 84.6 %   Platelets 351 150 - 400 K/uL   nRBC 1.7 (H) 0.0 - 0.2 %    Comment: Performed at [facility], [address], [place], [place] [address]   DG Chest Port 1 View Result Date: 02/20/2024 CLINICAL DATA:  Weakness and shortness of breath. Intermittent headaches and dizziness for the past few days. EXAM: PORTABLE CHEST 1 VIEW COMPARISON:  Radiographs 07/28/2021 and 02/19/2021.  CT 09/11/2010. FINDINGS: 0908 hours. The heart size and mediastinal contours are stable with mild aortic atherosclerosis.  The lungs appear clear. No pleural effusion or pneumothorax. No acute osseous findings status post right shoulder reverse arthroplasty. Left shoulder degenerative changes are noted. Telemetry leads overlie the chest. IMPRESSION: No evidence of acute cardiopulmonary process. Aortic atherosclerosis. Electronically Signed   By: Elmon Hagedorn M.D.   On: 02/20/2024 09:58   CT Head Wo Contrast Result Date: 02/20/2024 CLINICAL DATA:  78 year old female with intermittent headache and dizziness for several days. Stopped taking Plavix  late last month prior to a procedure. EXAM: CT HEAD WITHOUT CONTRAST TECHNIQUE:  Contiguous axial images were obtained from the base of the skull through the vertex without intravenous contrast. RADIATION DOSE REDUCTION: This exam was performed according to the departmental dose-optimization program which includes automated exposure control, adjustment of the mA and/or kV according to patient size and/or use of iterative reconstruction technique. COMPARISON:  Brain MRI 08/18/2021. FINDINGS: Brain: Multifocal chronic infarcts and cortical encephalomalacia in the anterior left ACA/MCA watershed, posterior right MCA territory, superior left PCA territory are stable from the previous brain MRI. No midline shift, ventriculomegaly, mass effect, evidence of mass lesion, intracranial hemorrhage or evidence of cortically based acute infarction. Vascular: Calcified atherosclerosis at the skull base. No suspicious intracranial vascular hyperdensity. Skull: Intact, negative. Sinuses/Orbits: Tympanic cavities, visualized paranasal sinuses and mastoids are clear. Other: No acute orbit or scalp soft tissue finding. IMPRESSION: 1. No acute intracranial abnormality. 2. Stable multifocal chronic cerebral infarcts since a 2022 MRI. Electronically Signed   By: Marlise Simpers M.D.   On: 02/20/2024 09:53    Pending Labs Unresulted Labs (From admission, onward)     Start     Ordered   02/21/24 0500  Comprehensive metabolic panel  Tomorrow morning,   R        02/20/24 1125   02/21/24 0500  CBC with Differential/Platelet  Daily,   R      02/20/24 1203   02/21/24 0500  Reticulocytes  Daily,   R      02/20/24 1203   02/21/24 0500  Lactate dehydrogenase  Daily,   R      02/20/24 1203   02/21/24 0500  Comprehensive metabolic panel  Daily,   R      02/20/24 1203   02/21/24 0500  Hemoglobin A1c  Tomorrow morning,   R       Comments: To assess prior glycemic control    02/20/24 1838   02/20/24 1855  Technologist smear review  Once,   AD        02/20/24 1855   02/20/24 1848  Differential  Once,   R         02/20/24 1848   02/20/24 1204  Direct antiglobulin test  Once,   R        02/20/24 1203            Vitals/Pain Today's Vitals   02/20/24 2000 02/20/24 2003 02/20/24 2013 02/20/24 2014  BP: (!) 113/57 (!) 113/57  (!) 114/55  Pulse: 79 79  78  Resp: 20 (!) 21  (!) 21  Temp: 98.1 F (36.7 C) 98.1 F (36.7 C)    TempSrc: Oral Oral    SpO2: 99%  100% 100%  Weight:      Height:      PainSc:        Isolation Precautions No active isolations  Medications Medications  predniSONE  (DELTASONE ) tablet 80 mg (has no administration in time range)  Immune Globulin  10% (PRIVIGEN ) IV infusion 65 g ( Intravenous Rate/Dose Change  02/20/24 2016)  folic acid  (FOLVITE ) tablet 2 mg (2 mg Oral Given 02/20/24 1439)  famotidine  (PEPCID ) tablet 20 mg (has no administration in time range)  acetaminophen  (TYLENOL ) tablet 650 mg (has no administration in time range)    Or  acetaminophen  (TYLENOL ) suppository 650 mg (has no administration in time range)  ondansetron  (ZOFRAN ) tablet 4 mg (has no administration in time range)    Or  ondansetron  (ZOFRAN ) injection 4 mg (has no administration in time range)  0.9 %  sodium chloride  infusion (Manually program via Guardrails IV Fluids) ( Intravenous Not Given 02/20/24 1959)  furosemide (LASIX) injection 20 mg (has no administration in time range)  furosemide (LASIX) injection 20 mg (has no administration in time range)  HYDROcodone -acetaminophen  (NORCO/VICODIN) 5-325 MG per tablet 1 tablet (1 tablet Oral Given 02/20/24 1943)  gabapentin  (NEURONTIN ) capsule 300 mg (300 mg Oral Given 02/20/24 1729)  folic acid  (FOLVITE ) tablet 1 mg (has no administration in time range)  Carboxymethylcellulose Sod PF 0.5 % SOLN 1 drop (has no administration in time range)  levothyroxine  (SYNTHROID ) tablet 112 mcg (has no administration in time range)  LORazepam  (ATIVAN ) tablet 1 mg (has no administration in time range)  methocarbamol  (ROBAXIN ) tablet 750 mg (has no administration in  time range)  polycarbophil (FIBERCON) tablet 625 mg (has no administration in time range)  sertraline  (ZOLOFT ) tablet 100 mg (has no administration in time range)  insulin aspart (novoLOG) injection 0-15 Units (has no administration in time range)  0.9 %  sodium chloride  infusion (0 mLs Intravenous Stopped 02/20/24 1641)  0.9 %  sodium chloride  infusion (Manually program via Guardrails IV Fluids) ( Intravenous New Bag/Given 02/20/24 1959)  HYDROmorphone (DILAUDID) injection 0.5 mg (0.5 mg Intravenous Given 02/20/24 1137)  acetaminophen  (TYLENOL ) tablet 650 mg (650 mg Oral Given 02/20/24 1439)  diphenhydrAMINE  (BENADRYL ) capsule 25 mg (25 mg Oral Given 02/20/24 1439)  predniSONE  (DELTASONE ) tablet 80 mg (80 mg Oral Given 02/20/24 1439)  HYDROcodone -acetaminophen  (NORCO/VICODIN) 5-325 MG per tablet 1 tablet (1 tablet Oral Given 02/20/24 1440)    Mobility walks with person assist

## 2024-02-20 NOTE — ED Notes (Signed)
 Added on labs

## 2024-02-20 NOTE — ED Notes (Signed)
 Approx 30 mins following RBC transfusion started, pt reported increased anxiety/panic, dyspnea, bilateral leg numbness/pain that is new onset. Pt vitals remain WNL. Transfusion stopped, new set of NS provided, reported to blood bank and floor coverage Denece Finger NP, and advised to monitor sx for any changes.

## 2024-02-20 NOTE — ED Notes (Addendum)
 Sent message to provider from Blood Bank  please reach out to lab or myself, pathologist gave approval for least incompatible for reaction for blood, most compatible unable to get at this time.   [phone] is pathologist number Dr. Portia Brittle

## 2024-02-20 NOTE — ED Notes (Signed)
 Spoke with Dr. Bonita Bussing about blood. We are ok to proceed with giving blood; however, we are going to draw another H&H to see if there has been any change. Orders for blood sugar checks will be placed by provider as well.

## 2024-02-20 NOTE — ED Notes (Signed)
Given meal tray.

## 2024-02-20 NOTE — ED Notes (Signed)
 Patient's room is extremely hot, secretary will alert maintance of issue. Patient given fan.

## 2024-02-20 NOTE — ED Notes (Signed)
 Pt was given a ham sandwich and ginger ale to drink

## 2024-02-20 NOTE — ED Notes (Signed)
 Moved rooms due to room being extremely hot.

## 2024-02-20 NOTE — H&P (Signed)
 History and Physical    Patient: [name] ZOX:[id number] DOB: 1945-12-21 DOA: 02/20/2024 DOS: the patient was seen and examined on 02/20/2024 PCP: Dudley Ghee, MD  Patient coming from: Home  Chief Complaint:  Chief Complaint  Patient presents with   Dizziness   Headache   HPI: [name] is a 78 y.o. female with medical history significant of anxiety, osteoarthritis, abdomen and lower [place], depression, history of DVT, fungal infection, GERD, hypothyroidism, melanoma of the right upper thigh, migraine headaches, peripheral neuropathy, patent foramen ovale, sleep apnea not on CPAP, stress incontinence, history of nonhemorrhagic CVA, history of hemolytic anemia in 2022 who presented to emergency department with headache and dizziness for the past few days.  She also looked jaundiced. She denied fever, chills, rhinorrhea, sore throat, wheezing or hemoptysis.  No chest pain, palpitations, diaphoresis, PND, orthopnea or pitting edema of the lower extremities.  No abdominal pain, nausea, emesis, diarrhea, constipation, melena or hematochezia.  No flank pain, dysuria, frequency or hematuria.  No polyuria, polydipsia, polyphagia or blurred vision.   Lab work: CBC showed a white count of 10.3, hemoglobin 5.3 g/dL with an MCV of 811.9 fL and platelets 329.  Reticulocyte count 6.2%.  Unremarkable PT and INR.  Lactic acid 2.0 then 1.3 mmol/L.  LDH 348 units/L.  Troponin was 18 then 19 ng/L.  Urinalysis showed trace leukocyte esterase but was otherwise unremarkable.  Normal lipase, magnesium , phosphorus and ammonia level.  Imaging: Portable 1 view chest radiograph with no evidence of acute cardiopulmonary process.  Aortic atherosclerosis.  CT head without contrast with no acute intercranial normality.  Stable multifocal chronic infarcts since 2022's MRI.   ED course: Initial vital signs were temperature 98.5 F, pulse 92, respirations 16, BP 120/63 mmHg O2 sat 100% on room air.  Patient received  acetaminophen  650 mg p.o., diphenhydramine  25 mg p.o. x 1 hydromorphone 0.5 mg IVP x 1, a tablet of Norco 5/325 mg and prednisone  80 mg p.o. daily.  Review of Systems: As mentioned in the history of present illness. All other systems reviewed and are negative.  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   Depression    DVT (deep venous thrombosis) (HCC)    acute right peroneal DVT & age indeterminant left popliteal DVT 4//1922, s/p Xarelto  x 6 months   Fungal infection    around lips started 12-15-2022 on fluconazole  for   GERD (gastroesophageal reflux disease)    Hypothyroidism    melanoma right upper thigh 2001   MIGRAINE HEADACHE    Neuromuscular disorder (HCC)    condition where muscle separates from bone left chest, quarter size, pain intermittent   Neuropathy    feet   PFO (patent foramen ovale)    closure 09/17/21   Sleep apnea    does not use CPAP   Stress incontinence    Stroke (HCC) 01/05/2021   Wears glasses    Past Surgical History:  Procedure Laterality Date   BALLOON DILATION N/A 09/18/2020   Procedure: BALLOON DILATION;  Surgeon: Genell Ken, MD;  Location: [facility];  Service: Gastroenterology;  Laterality: N/A;   bilateral bunion removal     yrs ago   BUBBLE STUDY  07/03/2021   Procedure: BUBBLE STUDY;  Surgeon: Lenise Quince, MD;  Location[facility] ENDOSCOPY;  Service: Cardiovascular;;   CYSTOSCOPY WITH INJECTION N/A 01/11/2023   Procedure: CYSTOSCOPY WITH INJECTION OF [place];  Surgeon: Pace, Maryellen  D, MD;  Location: [facility];  Service: Urology;  Laterality: N/A;  45 MINS   DRUG INDUCED ENDOSCOPY Bilateral 07/28/2022   Procedure: DRUG INDUCED ENDOSCOPY;  Surgeon: Virgina Grills, MD;  Location: [facility];  Service: ENT;  Laterality: Bilateral;   ESOPHAGOGASTRODUODENOSCOPY (EGD) WITH PROPOFOL  N/A 09/18/2020   Procedure: ESOPHAGOGASTRODUODENOSCOPY (EGD) WITH  PROPOFOL ;  Surgeon: Genell Ken, MD;  Location: [facility];  Service: Gastroenterology;  Laterality: N/A;   EYE SURGERY Bilateral    Cataract L eye 1-20, R eye 3-20    FOOT ARTHRODESIS Left 11/19/2022   Procedure: ARTHRODESIS FOOT WITH BONE GRAFT;  Surgeon: Floyce Hutching, DPM;  Location: [facility] OR;  Service: Podiatry;  Laterality: Left;   FOREIGN BODY REMOVAL  09/18/2020   Procedure: FOREIGN BODY REMOVAL;  Surgeon: Genell Ken, MD;  Location: [facility];  Service: Gastroenterology;;   [place] FUSION Left 11/19/2022   Procedure: HALLUX METATARSAL PHALANGEAL JOINT FUSION;  Surgeon: Floyce Hutching, DPM;  Location: [facility] OR;  Service: Podiatry;  Laterality: Left;   HAMMER TOE SURGERY Left 11/19/2022   Procedure: HAMMER TOE CORRECTION SECOND/THIRD;  Surgeon: Floyce Hutching, DPM;  Location: [facility];  Service: Podiatry;  Laterality: Left;   METATARSAL OSTEOTOMY Left 11/19/2022   Procedure: METATARSAL OSTEOTOMY;  Surgeon: Floyce Hutching, DPM;  Location: [facility];  Service: Podiatry;  Laterality: Left;  BLOCK   MOHS SURGERY  2001   upper right thigh   PATENT FORAMEN OVALE(PFO) CLOSURE N/A 08/28/2021   Procedure: PATENT FORAMEN OVALE (PFO) CLOSURE;  Surgeon: Arnoldo Lapping, MD;  Location: [facility] INVASIVE CV LAB;  Service: Cardiovascular;  Laterality: N/A;   REVERSE SHOULDER ARTHROPLASTY Right 03/16/2019   Procedure: REVERSE SHOULDER ARTHROPLASTY;  Surgeon: Winston Hawking, MD;  Location: [facility];  Service: Orthopedics;  Laterality: Right;  interscalene block   TEE WITHOUT CARDIOVERSION N/A 07/03/2021   Procedure: TRANSESOPHAGEAL ECHOCARDIOGRAM (TEE);  Surgeon: Lenise Quince, MD;  Location: [facility] ENDOSCOPY;  Service: Cardiovascular;  Laterality: N/A;   TUBAL LIGATION     yrs ago   Social History:  reports that she quit smoking about 56 years ago. Her smoking use included cigarettes. She has never used smokeless tobacco. She reports that she does not drink alcohol and does not use drugs.  Allergies   Allergen Reactions   Oxycodone Other (See Comments)    Hallucinations    Aspirin  Other (See Comments)    Interferes with headaches  Other Reaction(s): stomach issues   Imipenem Other (See Comments)    Warm and tingly all over   Methocarbamol  Rash   Statins Other (See Comments)    leg pain, muscle cramps   Losartan Other (See Comments)    Pt unsure   Cephalexin Other (See Comments)    Stomach cramps   Clindamycin /Lincomycin Nausea And Vomiting and Rash   Contrast Media [Iodinated Contrast Media] Rash   Losartan Potassium-Hctz Other (See Comments)    Unknown Reaction - pt is unaware of this  Other Reaction(s): do not remember   Methylprednisolone  Rash    Rash on face   Penicillins Rash    Did it involve swelling of the face/tongue/throat, SOB, or low BP? No Did it involve sudden or severe rash/hives, skin peeling, or any reaction on the inside of your mouth or nose? Yes Did you need to seek medical attention at a hospital or doctor's office? No When did it last happen?      2011 If all above answers are "NO", may proceed  with cephalosporin use.    Quinolones Rash    Family History  Problem Relation Age of Onset   Heart disease Mother    Stroke Father    Arthritis Sister    Diabetes Brother    Arthritis Brother    Neuropathy Brother    Neuropathy Sister    Diabetes Brother     Prior to Admission medications   Medication Sig Start Date End Date Taking? Authorizing Provider  acetaminophen  (TYLENOL ) 500 MG tablet Take 1,000 mg by mouth every 4 (four) hours as needed for headache.    [provider]  Calcium  Carbonate+Vitamin D  600-200 MG-UNIT TABS Take 1 tablet by mouth 2 (two) times daily.    [provider]  Carboxymethylcellulose Sod PF (EQ RESTORE PLUS LUBRICANT EYE) 0.5 % SOLN Place 1 drop into both eyes daily as needed (Dry eye).    [provider]  clopidogrel  (PLAVIX ) 75 MG tablet Take 75 mg by mouth daily.    [provider]   estradiol (ESTRACE) 0.1 MG/GM vaginal cream Place 1 Applicatorful vaginally every other day. 02/02/24   [provider]  folic acid  (FOLVITE ) 1 MG tablet Take 2 tablets (2 mg total) by mouth daily. Patient taking differently: Take 1 mg by mouth daily. 02/25/21   Barbee Lew, MD  gabapentin  (NEURONTIN ) 300 MG capsule Take 1-2 capsules (300-600 mg total) by mouth 4 (four) times daily as needed (max. 2400mg  daily). 11/14/23   Millikan, Megan, NP  HYDROcodone -acetaminophen  (NORCO/VICODIN) 5-325 MG tablet Take 1 tablet by mouth 2 (two) times daily. 03/30/23   [provider]  hydrocortisone 2.5 % cream Apply 1 Application topically in the morning. On face 11/15/22   [provider]  levothyroxine  (SYNTHROID ) 112 MCG tablet Take 112 mcg by mouth daily before breakfast.    [provider]  LORazepam  (ATIVAN ) 2 MG tablet Take 2 mg by mouth 3 (three) times daily as needed for anxiety.    [provider]  MELATONIN PO Take 5 mg by mouth daily as needed.    [provider]  meloxicam (MOBIC) 15 MG tablet Take 15 mg by mouth daily at 12 noon. 06/16/23   [provider]  methocarbamol  (ROBAXIN ) 750 MG tablet Take 750 mg by mouth 2 (two) times daily as needed for muscle spasms. 06/10/21   [provider]  Multiple Vitamins-Minerals (MULTIVITAMIN WITH MINERALS) tablet Take 1 tablet by mouth daily. Complete    [provider]  mupirocin ointment (BACTROBAN) 2 % Apply 1 Application topically every evening. 11/15/22   [provider]  nystatin  (MYCOSTATIN /NYSTOP ) powder Apply 1 application topically 3 (three) times daily as needed (irritation).    [provider]  omeprazole (PRILOSEC) 20 MG capsule Take 20 mg by mouth as needed. 02/11/22   [provider]  polycarbophil (FIBERCON) 625 MG tablet Take 625 mg by mouth in the morning and at bedtime.    [provider]  sertraline  (ZOLOFT ) 100 MG tablet  Take 100 mg by mouth daily.    [provider]  sertraline  (ZOLOFT ) 50 MG tablet Take 50 mg by mouth daily.    [provider]  triamcinolone  cream (KENALOG ) 0.1 % 1 application daily as needed (Vaginal irritations).    [provider]    Physical Exam: Vitals:   02/20/24 0830 02/20/24 0845 02/20/24 0900 02/20/24 0915  BP: 117/73 (!) 118/59 (!) 113/47 (!) 103/48  Pulse: 66 86 82 79  Resp: 14 12 15 20   Temp:  SpO2: 100% 96% 97% 98%   Physical Exam Vitals and nursing note reviewed.  Constitutional:      General: She is awake. She is not in acute distress.    Appearance: She is ill-appearing.  HENT:     Head: Normocephalic.     Nose: No rhinorrhea.     Mouth/Throat:     Mouth: Mucous membranes are moist.  Eyes:     General: No scleral icterus.    Pupils: Pupils are equal, round, and reactive to light.  Neck:     Vascular: No JVD.  Cardiovascular:     Rate and Rhythm: Normal rate and regular rhythm.     Heart sounds: S1 normal and S2 normal.  Pulmonary:     Effort: Pulmonary effort is normal.     Breath sounds: Normal breath sounds.  Abdominal:     General: Bowel sounds are normal. There is no distension.     Palpations: Abdomen is soft.     Tenderness: There is no abdominal tenderness. There is no right CVA tenderness, left CVA tenderness or guarding.  Musculoskeletal:     Cervical back: Neck supple.     Right lower leg: No edema.     Left lower leg: No edema.  Skin:    General: Skin is warm and dry.  Neurological:     General: No focal deficit present.     Mental Status: She is alert and oriented to person, place, and time.  Psychiatric:        Mood and Affect: Mood normal.        Behavior: Behavior normal. Behavior is cooperative.     Data Reviewed:  Results are pending, will review when available.  EKG: Vent. rate 96 BPM PR interval 167 ms QRS duration 88 ms QT/QTcB 394/488 ms P-R-T axes 75 7 53 Sinus  rhythm PVCs  Assessment and Plan: Principal Problem:   Hemolytic anemia (HCC) Admit to stepdown/inpatient. Scheduled to get 2 units of PRBCs. -Has been premedicated. -Discussed with pathologist Dr. Portia Brittle. Monitor H&H. Transfuse further as needed. Hematology consult appreciated. - Begin prednisone  80 mg p.o. daily. -IVIG x 2 doses.  Active Problems:   Polyneuropathy Continue gabapentin  and Norco.    Hypothyroidism Continue levothyroxine  112 mcg p.o. daily.    OSA (obstructive sleep apnea) Not on CPAP. May use one of our devices per patient request.    Gastroesophageal reflux disease Antiacid, H2 blocker or PPI as needed.    Advance Care Planning:   Code Status: Full Code   Consults:   Family Communication:   Severity of Illness: The appropriate patient status for this patient is INPATIENT. Inpatient status is judged to be reasonable and necessary in order to provide the required intensity of service to ensure the patient's safety. The patient's presenting symptoms, physical exam findings, and initial radiographic and laboratory data in the context of their chronic comorbidities is felt to place them at high risk for further clinical deterioration. Furthermore, it is not anticipated that the patient will be medically stable for discharge from the hospital within 2 midnights of admission.   * I certify that at the point of admission it is my clinical judgment that the patient will require inpatient hospital care spanning beyond 2 midnights from the point of admission due to high intensity of service, high risk for further deterioration and high frequency of surveillance required.*  Author: Danice Dural, MD 02/20/2024 10:51 AM  For on call review [email or web address].   This document  was prepared using Conservation officer, historic buildings and may contain some unintended transcription errors.

## 2024-02-20 NOTE — ED Triage Notes (Signed)
 PT arrives via POV. Pt reports intermittent headache and dizziness for the past few days. No other focal neurological symptoms noted. PT is AxOx4. She reports she did stop taking her plavix  on 05/28 due her having a scheduled injection this Thursday for her back. Pt is notably pale in triage. Denies any other symptoms.

## 2024-02-20 NOTE — ED Notes (Signed)
I stat Lactic given to MD and RN

## 2024-02-20 NOTE — ED Notes (Signed)
 Meal tray delivered gabapentin  given for leg pain

## 2024-02-21 DIAGNOSIS — D591 Autoimmune hemolytic anemia, unspecified: Secondary | ICD-10-CM | POA: Diagnosis not present

## 2024-02-21 LAB — GLUCOSE, CAPILLARY
Glucose-Capillary: 122 mg/dL — ABNORMAL HIGH (ref 70–99)
Glucose-Capillary: 126 mg/dL — ABNORMAL HIGH (ref 70–99)
Glucose-Capillary: 139 mg/dL — ABNORMAL HIGH (ref 70–99)
Glucose-Capillary: 158 mg/dL — ABNORMAL HIGH (ref 70–99)

## 2024-02-21 LAB — CBC WITH DIFFERENTIAL/PLATELET
Abs Immature Granulocytes: 0.23 10*3/uL — ABNORMAL HIGH (ref 0.00–0.07)
Abs Immature Granulocytes: 0.21 10*3/uL — ABNORMAL HIGH (ref 0.00–0.07)
Basophils Absolute: 0 10*3/uL (ref 0.0–0.1)
Basophils Absolute: 0 10*3/uL (ref 0.0–0.1)
Basophils Relative: 0 %
Basophils Relative: 0 %
Eosinophils Absolute: 0 10*3/uL (ref 0.0–0.5)
Eosinophils Absolute: 0 10*3/uL (ref 0.0–0.5)
Eosinophils Relative: 0 %
Eosinophils Relative: 0 %
HCT: 16.6 % — ABNORMAL LOW (ref 36.0–46.0)
HCT: 20.7 % — ABNORMAL LOW (ref 36.0–46.0)
Hemoglobin: 4.7 g/dL — CL (ref 12.0–15.0)
Hemoglobin: 6.4 g/dL — CL (ref 12.0–15.0)
Immature Granulocytes: 2 %
Immature Granulocytes: 2 %
Lymphocytes Relative: 13 %
Lymphocytes Relative: 12 %
Lymphs Abs: 1.5 10*3/uL (ref 0.7–4.0)
Lymphs Abs: 1.2 10*3/uL (ref 0.7–4.0)
MCH: 34.6 pg — ABNORMAL HIGH (ref 26.0–34.0)
MCH: 33.7 pg (ref 26.0–34.0)
MCHC: 28.3 g/dL — ABNORMAL LOW (ref 30.0–36.0)
MCHC: 30.9 g/dL (ref 30.0–36.0)
MCV: 122.1 fL — ABNORMAL HIGH (ref 80.0–100.0)
MCV: 108.9 fL — ABNORMAL HIGH (ref 80.0–100.0)
Monocytes Absolute: 1 10*3/uL (ref 0.1–1.0)
Monocytes Absolute: 0.2 10*3/uL (ref 0.1–1.0)
Monocytes Relative: 9 %
Monocytes Relative: 2 %
Neutro Abs: 8.6 10*3/uL — ABNORMAL HIGH (ref 1.7–7.7)
Neutro Abs: 8.5 10*3/uL — ABNORMAL HIGH (ref 1.7–7.7)
Neutrophils Relative %: 76 %
Neutrophils Relative %: 84 %
Platelets: 293 10*3/uL (ref 150–400)
Platelets: 314 10*3/uL (ref 150–400)
RBC: 1.36 MIL/uL — ABNORMAL LOW (ref 3.87–5.11)
RBC: 1.9 MIL/uL — ABNORMAL LOW (ref 3.87–5.11)
RDW: 15.9 % — ABNORMAL HIGH (ref 11.5–15.5)
RDW: 20.9 % — ABNORMAL HIGH (ref 11.5–15.5)
WBC: 11.3 10*3/uL — ABNORMAL HIGH (ref 4.0–10.5)
WBC: 10.2 10*3/uL (ref 4.0–10.5)
nRBC: 1.6 % — ABNORMAL HIGH (ref 0.0–0.2)
nRBC: 1.6 % — ABNORMAL HIGH (ref 0.0–0.2)

## 2024-02-21 LAB — RETICULOCYTES
Immature Retic Fract: 50.8 % — ABNORMAL HIGH (ref 2.3–15.9)
RBC.: 1.33 MIL/uL — ABNORMAL LOW (ref 3.87–5.11)
Retic Count, Absolute: 95.1 10*3/uL (ref 19.0–186.0)
Retic Ct Pct: 7.2 % — ABNORMAL HIGH (ref 0.4–3.1)

## 2024-02-21 LAB — COMPREHENSIVE METABOLIC PANEL WITH GFR
ALT: 13 U/L (ref 0–44)
AST: 24 U/L (ref 15–41)
Albumin: 3.4 g/dL — ABNORMAL LOW (ref 3.5–5.0)
Alkaline Phosphatase: 51 U/L (ref 38–126)
Anion gap: 6 (ref 5–15)
BUN: 13 mg/dL (ref 8–23)
CO2: 18 mmol/L — ABNORMAL LOW (ref 22–32)
Calcium: 8.5 mg/dL — ABNORMAL LOW (ref 8.9–10.3)
Chloride: 112 mmol/L — ABNORMAL HIGH (ref 98–111)
Creatinine, Ser: 0.57 mg/dL (ref 0.44–1.00)
GFR, Estimated: >60 mL/min (ref >60)
Glucose, Bld: 137 mg/dL — ABNORMAL HIGH (ref 70–99)
Potassium: 4.1 mmol/L (ref 3.5–5.1)
Sodium: 136 mmol/L (ref 135–145)
Total Bilirubin: 1.8 mg/dL — ABNORMAL HIGH (ref 0.0–1.2)
Total Protein: 7.5 g/dL (ref 6.5–8.1)

## 2024-02-21 LAB — LACTATE DEHYDROGENASE: LDH: 328 U/L — ABNORMAL HIGH (ref 98–192)

## 2024-02-21 LAB — URINALYSIS, COMPLETE (UACMP) WITH MICROSCOPIC
Bacteria, UA: NONE SEEN
Bilirubin Urine: NEGATIVE
Glucose, UA: NEGATIVE mg/dL
Hgb urine dipstick: NEGATIVE
Ketones, ur: NEGATIVE mg/dL
Leukocytes,Ua: NEGATIVE
Nitrite: NEGATIVE
Protein, ur: NEGATIVE mg/dL
Specific Gravity, Urine: 1.005 (ref 1.005–1.030)
pH: 6 (ref 5.0–8.0)

## 2024-02-21 LAB — HEMOGLOBIN AND HEMATOCRIT, BLOOD
HCT: 21.9 % — ABNORMAL LOW (ref 36.0–46.0)
Hemoglobin: 6.6 g/dL — CL (ref 12.0–15.0)

## 2024-02-21 LAB — DIRECT ANTIGLOBULIN TEST (NOT AT ARMC)
DAT, IgG: POSITIVE
DAT, complement: NEGATIVE

## 2024-02-21 LAB — PREPARE RBC (CROSSMATCH)

## 2024-02-21 MED ORDER — ACETAMINOPHEN 325 MG PO TABS
650.0000 mg | ORAL_TABLET | Freq: Once | ORAL | Status: AC | PRN
  Administered 2024-02-22: 650 mg via ORAL
  Filled 2024-02-21: qty 2

## 2024-02-21 MED ORDER — ACETAMINOPHEN 325 MG PO TABS: 650.0000 mg | ORAL_TABLET | Freq: Once | ORAL | Status: DC | PRN

## 2024-02-21 MED ORDER — FUROSEMIDE 10 MG/ML IJ SOLN
20.0000 mg | Freq: Once | INTRAMUSCULAR | Status: AC | PRN
  Administered 2024-02-22: 20 mg via INTRAVENOUS
  Filled 2024-02-21: qty 2

## 2024-02-21 MED ORDER — FAMOTIDINE IN NACL 20-0.9 MG/50ML-% IV SOLN
20.0000 mg | Freq: Once | INTRAVENOUS | Status: AC
  Administered 2024-02-21: 20 mg via INTRAVENOUS
  Filled 2024-02-21: qty 50

## 2024-02-21 MED ORDER — DIPHENHYDRAMINE HCL 50 MG/ML IJ SOLN
12.5000 mg | Freq: Once | INTRAMUSCULAR | Status: AC
  Administered 2024-02-21: 12.5 mg via INTRAVENOUS
  Filled 2024-02-21: qty 1

## 2024-02-21 MED ORDER — SODIUM CHLORIDE 0.9% IV SOLUTION: Freq: Once | INTRAVENOUS | Status: AC

## 2024-02-21 MED FILL — Immune Globulin (Human) IV Soln 40 GM/400ML: INTRAVENOUS | Qty: 400 | Status: AC

## 2024-02-21 MED FILL — Immune Globulin (Human) IV Soln 5 GM/50ML: INTRAVENOUS | Qty: 50 | Status: AC

## 2024-02-21 MED FILL — Immune Globulin (Human) IV Soln 20 GM/200ML: INTRAVENOUS | Qty: 200 | Status: AC

## 2024-02-21 NOTE — Progress Notes (Signed)
 IVIG now up to 60mg /kg/hr

## 2024-02-21 NOTE — Plan of Care (Signed)

## 2024-02-21 NOTE — Progress Notes (Signed)
 PROGRESS NOTE    [name]  ZOX:[id number] DOB: [birth year] DOA: 02/20/2024 PCP: Dudley Ghee, MD    Brief Narrative:  78 year old with history of anxiety, osteoarthritis, history of DVT, GERD, hypothyroidism and melanoma of the right upper thigh, peripheral neuropathy, history of hemolytic anemia in 2022 presented to the emergency room with headache and dizziness for past few days, looking jaundiced.  In the emergency room hemodynamically stable.  WBC count 10.3.  Hemoglobin 5.3.  Reticulocyte 6.2.  PT/INR normal.  Lactic acid 1.3.  LDH 348.  Troponins normal.  Subjective: Patient seen and examined.  Currently denies any complaints.  She was able to complete her second transfusion today without difficulty.  Will recheck hemoglobin in the evening.  Patient currently denies any dizziness lightheadedness or any bleeding. 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   Hemoglobin 4.7--- completing 2 units of PRBC.   Patient started on high-dose steroids 80 mg daily Patient also started on immunoglobulin Oncology following  Polyneuropathy: Continue gabapentin  and Norco Hypothyroidism: On Synthroid  GERD, on PPI   DVT prophylaxis: SCDs Start: 02/20/24 1126   Code Status: Full code Family Communication: None at the bedside Disposition Plan: Status is: Inpatient Remains inpatient appropriate because: Blood transfusions, IVIG transfusion     Consultants:  Hematology  Procedures:  None  Antimicrobials:  None     Objective: Vitals:   02/21/24 0400 02/21/24 0650 02/21/24 0654 02/21/24 0714  BP:   (!) 131/41 (!) 117/45  Pulse:   70 73  Resp:  (!) 25 16 17   Temp: 98 F (36.7 C) 98 F (36.7 C) 98 F (36.7 C) 98.1 F (36.7 C)  TempSrc: Oral Oral Oral Oral  SpO2:   100% 100%  Weight:      Height:        Intake/Output Summary (Last 24 hours) at 02/21/2024 0743 Last data filed at 02/21/2024 0643 Gross per 24  hour  Intake 2570.27 ml  Output 1100 ml  Net 1470.27 ml   Filed Weights   02/20/24 1219 02/20/24 2136  Weight: 73.9 kg 74.7 kg    Examination:  General exam: Appears calm and comfortable.  Pleasant interactive. Respiratory system: Clear to auscultation. Respiratory effort normal. Cardiovascular system: S1 & S2 heard, RRR. No JVD, murmurs, rubs, gallops or clicks. No pedal edema. Gastrointestinal system: Abdomen is nondistended, soft and nontender. No organomegaly or masses felt. Normal bowel sounds heard. Central nervous system: Alert and oriented. No focal neurological deficits. Extremities: Symmetric 5 x 5 power. Skin: No rashes, lesions or ulcers Psychiatry: Judgement and insight appear normal. Mood & affect appropriate.     Data Reviewed: I have personally reviewed following labs and imaging studies  CBC: Recent Labs  Lab 02/20/24 0830 02/20/24 1848 02/21/24 0336  WBC 10.3 9.8 11.3*  NEUTROABS 7.0 7.8* 8.6*  HGB 5.3* 5.1* 4.7*  HCT 16.9* 16.8* 16.6*  MCV 111.9* 116.7* 122.1*  PLT 329 351 293   Basic Metabolic Panel: Recent Labs  Lab 02/20/24 0830 02/20/24 0850 02/21/24 0336  NA  --  128* 136  K  --  3.7 4.1  CL  --  104 112*  CO2  --  20* 18*  GLUCOSE  --  126* 137*  BUN  --  15 13  CREATININE  --  0.57 0.57  CALCIUM   --  8.1* 8.5*  MG 1.8  --   --   PHOS 2.7  --   --  GFR: Estimated Creatinine Clearance: 61.5 mL/min (by C-G formula based on SCr of 0.57 mg/dL). Liver Function Tests: Recent Labs  Lab 02/20/24 0850 02/20/24 0854 02/21/24 0336  AST 29  --  24  ALT 14  --  13  ALKPHOS 59  --  51  BILITOT 5.4* 4.9* 1.8*  PROT 6.5  --  7.5  ALBUMIN 3.9  --  3.4*   Recent Labs  Lab 02/20/24 0830  LIPASE 32   Recent Labs  Lab 02/20/24 0851  AMMONIA 14   Coagulation Profile: Recent Labs  Lab 02/20/24 0830  INR 1.1   Cardiac Enzymes: No results for input(s): "CKTOTAL", "CKMB", "CKMBINDEX", "TROPONINI" in the last 168 hours. BNP (last  3 results) No results for input(s): "PROBNP" in the last 8760 hours. HbA1C: No results for input(s): "HGBA1C" in the last 72 hours. CBG: Recent Labs  Lab 02/20/24 0817 02/20/24 2210 02/21/24 0742  GLUCAP 133* 177* 122*   Lipid Profile: No results for input(s): "CHOL", "HDL", "LDLCALC", "TRIG", "CHOLHDL", "LDLDIRECT" in the last 72 hours. Thyroid  Function Tests: Recent Labs    02/20/24 0853  TSH 4.876*   Anemia Panel: Recent Labs    02/20/24 1100 02/21/24 0336  VITAMINB12 268  --   FOLATE 16.8  --   FERRITIN 358*  --   TIBC 318  --   IRON 152  --   RETICCTPCT 6.2* 7.2*   Sepsis Labs: Recent Labs  Lab 02/20/24 0909 02/20/24 1117  LATICACIDVEN 2.0* 1.3    Recent Results (from the past 240 hours)  MRSA Next Gen by PCR, Nasal     Status: None   Collection Time: 02/20/24  9:58 PM   Specimen: Nasal Mucosa; Nasal Swab  Result Value Ref Range Status   MRSA by PCR Next Gen NOT DETECTED NOT DETECTED Final    Comment: (NOTE) The GeneXpert MRSA Assay (FDA approved for NASAL specimens only), is one component of a comprehensive MRSA colonization surveillance program. It is not intended to diagnose MRSA infection nor to guide or monitor treatment for MRSA infections. Test performance is not FDA approved in patients less than 50 years old. Performed at [facility], [address] [place], [place] [address]          Radiology Studies: [facility] Chest Port 1 View Result Date: 02/20/2024 CLINICAL DATA:  Weakness and shortness of breath. Intermittent headaches and dizziness for the past few days. EXAM: PORTABLE CHEST 1 VIEW COMPARISON:  Radiographs 07/28/2021 and 02/19/2021.  CT 09/11/2010. FINDINGS: 0908 hours. The heart size and mediastinal contours are stable with mild aortic atherosclerosis. The lungs appear clear. No pleural effusion or pneumothorax. No acute osseous findings status post right shoulder reverse arthroplasty. Left shoulder degenerative  changes are noted. Telemetry leads overlie the chest. IMPRESSION: No evidence of acute cardiopulmonary process. Aortic atherosclerosis. Electronically Signed   By: Elmon Hagedorn M.D.   On: 02/20/2024 09:58   CT Head Wo Contrast Result Date: 02/20/2024 CLINICAL DATA:  78 year old female with intermittent headache and dizziness for several days. Stopped taking Plavix  late last month prior to a procedure. EXAM: CT HEAD WITHOUT CONTRAST TECHNIQUE: Contiguous axial images were obtained from the base of the skull through the vertex without intravenous contrast. RADIATION DOSE REDUCTION: This exam was performed according to the departmental dose-optimization program which includes automated exposure control, adjustment of the mA and/or kV according to patient size and/or use of iterative reconstruction technique. COMPARISON:  Brain MRI 08/18/2021. FINDINGS: Brain: Multifocal chronic infarcts and  cortical encephalomalacia in the anterior left ACA/MCA watershed, posterior right MCA territory, superior left PCA territory are stable from the previous brain MRI. No midline shift, ventriculomegaly, mass effect, evidence of mass lesion, intracranial hemorrhage or evidence of cortically based acute infarction. Vascular: Calcified atherosclerosis at the skull base. No suspicious intracranial vascular hyperdensity. Skull: Intact, negative. Sinuses/Orbits: Tympanic cavities, visualized paranasal sinuses and mastoids are clear. Other: No acute orbit or scalp soft tissue finding. IMPRESSION: 1. No acute intracranial abnormality. 2. Stable multifocal chronic cerebral infarcts since a 2022 MRI. Electronically Signed   By: Marlise Simpers M.D.   On: 02/20/2024 09:53        Scheduled Meds:  Chlorhexidine  Gluconate Cloth  6 each Topical Daily   famotidine   20 mg Oral BID   folic acid   2 mg Oral Daily   furosemide  20 mg Intravenous Once   furosemide  20 mg Intravenous Once   insulin aspart  0-15 Units Subcutaneous TID WC    levothyroxine   112 mcg Oral QAC breakfast   polycarbophil  625 mg Oral BID   predniSONE   80 mg Oral Q breakfast   sertraline   100 mg Oral Daily   Continuous Infusions:  Immune Globulin  10% Stopped (02/21/24 0313)     LOS: 1 day    Time spent: 55 minutes    Vada Garibaldi, MD [facility]

## 2024-02-21 NOTE — Progress Notes (Signed)
 Ms. Gerlich has hemoglobin of 4.7 today.  Again, she is hemolyzing.  She has autoimmune hemolytic anemia.  We have her on steroids.  She has not gotten the IVIG yet.  She will need to be transfused with least incompatible blood.  I realize that she had a reaction to the blood yesterday.  I am little surprised that she is basically on steroids as a premedication.  We are probably going to have to get a CT and ultrasound of her abdomen looking at her spleen.  I cannot feel this on exam.  She does seem to be good spirits.  I am just not sure as to what triggered this relapse.  Has been over 3 years since she had a relapse.  Again, I have weaned this try to get a transfusion into her, this, sure would help..  I noted that her bilirubin was down a little bit today.  Maybe, this is a indicator that her hemolysis could be slowing down a little bit.  Her vital signs are all stable.  Temperature 98.  Pulse 73.  Blood pressure 131/41.  Her head and neck exam does not show any obvious scleral icterus.  There is no palatal icterus.  She has no adenopathy in the neck.  Lungs are clear bilaterally.  She has good air movement bilaterally.  Cardiac exam regular rate and rhythm.  She has 1/6 systolic ejection murmur.  Abdomen is soft.  Bowel sounds are present.  There is no fluid wave.  There is no palpable liver or spleen tip.  Extremity shows no clubbing, cyanosis or edema.  Skin exam shows no rashes.  Again, we will have to transfuse her.  Hopefully, she will be able to manage least incompatible blood.  We have to get the IVIG into her.  She is on steroids.  I think when she is little more stable, we can get a splenic ultrasound.  I cannot rule out the possibility of splenectomy to treat this relapse.  Again, I know she will get great care from everybody in the ICU.  [name], MD  [place] [phone]

## 2024-02-21 NOTE — Plan of Care (Signed)
  Problem: Fluid Volume: Goal: Ability to maintain a balanced intake and output will improve Outcome: Progressing   Problem: Education: Goal: Knowledge of General Education information will improve Description: Including pain rating scale, medication(s)/side effects and non-pharmacologic comfort measures Outcome: Progressing   Problem: Activity: Goal: Risk for activity intolerance will decrease Outcome: Progressing   Problem: Coping: Goal: Level of anxiety will decrease Outcome: Not Progressing   Problem: Elimination: Goal: Will not experience complications related to urinary retention Outcome: Progressing   Problem: Safety: Goal: Ability to remain free from injury will improve Outcome: Progressing

## 2024-02-21 NOTE — Progress Notes (Signed)
   02/21/24 0913  TOC Brief Assessment  Insurance and Status Reviewed  Patient has primary care physician Yes  Home environment has been reviewed single family home  Prior level of function: independent  Prior/Current Home Services No current home services  Social Drivers of Health Review SDOH reviewed no interventions necessary  Readmission risk has been reviewed Yes  Transition of care needs transition of care needs identified, TOC will continue to follow    [place], MSW, LCSW 02/21/2024 9:14 AM

## 2024-02-21 NOTE — ED Notes (Signed)
 Physical blood consent form signed, due to [facility] pad was not working, placed in med records folder to import into [facility].

## 2024-02-22 ENCOUNTER — Inpatient Hospital Stay (HOSPITAL_COMMUNITY)

## 2024-02-22 ENCOUNTER — Encounter: Payer: Self-pay | Admitting: *Deleted

## 2024-02-22 DIAGNOSIS — D591 Autoimmune hemolytic anemia, unspecified: Secondary | ICD-10-CM | POA: Diagnosis not present

## 2024-02-22 LAB — CBC WITH DIFFERENTIAL/PLATELET
Abs Immature Granulocytes: 0.11 10*3/uL — ABNORMAL HIGH (ref 0.00–0.07)
Abs Immature Granulocytes: 0.1 10*3/uL — ABNORMAL HIGH (ref 0.00–0.07)
Basophils Absolute: 0 10*3/uL (ref 0.0–0.1)
Basophils Absolute: 0 10*3/uL (ref 0.0–0.1)
Basophils Relative: 0 %
Basophils Relative: 0 %
Eosinophils Absolute: 0 10*3/uL (ref 0.0–0.5)
Eosinophils Absolute: 0 10*3/uL (ref 0.0–0.5)
Eosinophils Relative: 0 %
Eosinophils Relative: 0 %
HCT: 22 % — ABNORMAL LOW (ref 36.0–46.0)
HCT: 25.9 % — ABNORMAL LOW (ref 36.0–46.0)
Hemoglobin: 6.2 g/dL — CL (ref 12.0–15.0)
Hemoglobin: 8.1 g/dL — ABNORMAL LOW (ref 12.0–15.0)
Immature Granulocytes: 1 %
Immature Granulocytes: 1 %
Lymphocytes Relative: 16 %
Lymphocytes Relative: 10 %
Lymphs Abs: 1.5 10*3/uL (ref 0.7–4.0)
Lymphs Abs: 0.8 10*3/uL (ref 0.7–4.0)
MCH: 34.4 pg — ABNORMAL HIGH (ref 26.0–34.0)
MCH: 34.3 pg — ABNORMAL HIGH (ref 26.0–34.0)
MCHC: 28.2 g/dL — ABNORMAL LOW (ref 30.0–36.0)
MCHC: 31.3 g/dL (ref 30.0–36.0)
MCV: 122.2 fL — ABNORMAL HIGH (ref 80.0–100.0)
MCV: 109.7 fL — ABNORMAL HIGH (ref 80.0–100.0)
Monocytes Absolute: 0.5 10*3/uL (ref 0.1–1.0)
Monocytes Absolute: 0.1 10*3/uL (ref 0.1–1.0)
Monocytes Relative: 6 %
Monocytes Relative: 1 %
Neutro Abs: 7.2 10*3/uL (ref 1.7–7.7)
Neutro Abs: 7.3 10*3/uL (ref 1.7–7.7)
Neutrophils Relative %: 77 %
Neutrophils Relative %: 88 %
Platelets: 260 10*3/uL (ref 150–400)
Platelets: 273 10*3/uL (ref 150–400)
RBC: 1.8 MIL/uL — ABNORMAL LOW (ref 3.87–5.11)
RBC: 2.36 MIL/uL — ABNORMAL LOW (ref 3.87–5.11)
RDW: 22.8 % — ABNORMAL HIGH (ref 11.5–15.5)
RDW: 21.4 % — ABNORMAL HIGH (ref 11.5–15.5)
WBC: 9.4 10*3/uL (ref 4.0–10.5)
WBC: 8.4 10*3/uL (ref 4.0–10.5)
nRBC: 1.3 % — ABNORMAL HIGH (ref 0.0–0.2)
nRBC: 1.1 % — ABNORMAL HIGH (ref 0.0–0.2)

## 2024-02-22 LAB — COMPREHENSIVE METABOLIC PANEL WITH GFR
ALT: 15 U/L (ref 0–44)
AST: 28 U/L (ref 15–41)
Albumin: 3.3 g/dL — ABNORMAL LOW (ref 3.5–5.0)
Alkaline Phosphatase: 46 U/L (ref 38–126)
Anion gap: 9 (ref 5–15)
BUN: 17 mg/dL (ref 8–23)
CO2: 18 mmol/L — ABNORMAL LOW (ref 22–32)
Calcium: 8.5 mg/dL — ABNORMAL LOW (ref 8.9–10.3)
Chloride: 106 mmol/L (ref 98–111)
Creatinine, Ser: 0.69 mg/dL (ref 0.44–1.00)
GFR, Estimated: >60 mL/min (ref >60)
Glucose, Bld: 104 mg/dL — ABNORMAL HIGH (ref 70–99)
Potassium: 3.6 mmol/L (ref 3.5–5.1)
Sodium: 133 mmol/L — ABNORMAL LOW (ref 135–145)
Total Bilirubin: 1.3 mg/dL — ABNORMAL HIGH (ref 0.0–1.2)
Total Protein: 8.5 g/dL — ABNORMAL HIGH (ref 6.5–8.1)

## 2024-02-22 LAB — GLUCOSE, CAPILLARY
Glucose-Capillary: 93 mg/dL (ref 70–99)
Glucose-Capillary: 124 mg/dL — ABNORMAL HIGH (ref 70–99)
Glucose-Capillary: 179 mg/dL — ABNORMAL HIGH (ref 70–99)
Glucose-Capillary: 145 mg/dL — ABNORMAL HIGH (ref 70–99)

## 2024-02-22 LAB — RETICULOCYTES
Immature Retic Fract: 57.9 % — ABNORMAL HIGH (ref 2.3–15.9)
RBC.: 1.79 MIL/uL — ABNORMAL LOW (ref 3.87–5.11)
Retic Count, Absolute: 202.4 10*3/uL — ABNORMAL HIGH (ref 19.0–186.0)
Retic Ct Pct: 11.3 % — ABNORMAL HIGH (ref 0.4–3.1)

## 2024-02-22 LAB — LACTATE DEHYDROGENASE: LDH: 308 U/L — ABNORMAL HIGH (ref 98–192)

## 2024-02-22 LAB — HEMOGLOBIN A1C
Hgb A1c MFr Bld: 4.5 % — ABNORMAL LOW (ref 4.8–5.6)
Mean Plasma Glucose: 82 mg/dL

## 2024-02-22 MED ORDER — CARMEX CLASSIC LIP BALM EX OINT
TOPICAL_OINTMENT | CUTANEOUS | Status: DC | PRN
  Filled 2024-02-22: qty 10

## 2024-02-22 MED ORDER — LORAZEPAM 1 MG PO TABS
1.0000 mg | ORAL_TABLET | ORAL | Status: DC | PRN
  Administered 2024-02-22 – 2024-02-23 (×2): 1 mg via ORAL
  Filled 2024-02-22 (×2): qty 1

## 2024-02-22 NOTE — Congregational Nurse Program (Signed)
 Patient admitted into hospital due to low hemoglobin.  No recent bloody stools and emesis. No dark tarry stools.  Reason for the acute anemia is unknown.  Patient is now stable and awaiting possible dc back to home with husband.

## 2024-02-22 NOTE — Progress Notes (Signed)
 [name] has been moved up to 4 W.  She did okay with the transfusion yesterday.  I do not think she had a reaction.  She has on prednisone .  She has gotten IVIG.  I think she gets another dose today.  She is still hemolyzing.  Her white blood cell count 9.4.  Hemoglobin 6.2.  Platelet count 260,000.  MCV is 122.  Reticulocyte count is 11.2%.  LDH is 308.  Again, she is on prednisone  and IVIG.  We may need to add Rituxan .  I know that she had Rituxan  last time this had happened.  She has had no fever.  She has had no obvious bleeding.  There is been no change in bowel or bladder habits.  Appetite is okay.  She really has had no nausea or vomiting.  The been no rashes.  She has not noted any swollen lymph nodes.   Vital signs are temperature 98.7.  Pulse 68.  Blood pressure 135/71.  Her lungs are clear bilaterally.  Cardiac exam regular rate and rhythm.  She has no murmurs.  Abdomen is soft.  Bowel sounds are present.  There is no fluid wave.  There is no palpable liver or spleen tip.  Extremities shows no clubbing, cyanosis or edema.   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  I am not sure what triggered this episode.  Hopefully, the prednisone  in the IVIG will slow down the hemolysis.  If not, we will have to add Rituxan .  I know she really wants to get home.  I know her husband has dementia.  I know she is trying to help him out.  I do appreciate everybody's help.  I know this is somewhat complicated.  [name], MD  [address] [phone]

## 2024-02-22 NOTE — Progress Notes (Signed)
 PROGRESS NOTE    ICEIS KNAB  QQV:[id number] DOB: Aug 29, 1946 DOA: 02/20/2024 PCP: Dudley Ghee, MD    Brief Narrative:  78 year old with history of anxiety, osteoarthritis, history of DVT, GERD, hypothyroidism and melanoma of the right upper thigh, peripheral neuropathy, history of hemolytic anemia in 2022 presented to the emergency room with headache and dizziness for past few days, looking jaundiced.  In the emergency room hemodynamically stable.  WBC count 10.3.  Hemoglobin 5.3.  Reticulocyte 6.2.  PT/INR normal.  Lactic acid 1.3.  LDH 348.  Troponins normal.  Subjective: Patient was seen and examined.  No overnight events.  She is more awake but had some trouble in her neighbors took care of her last night.  Feels anxious.  Denies any nausea vomiting dizziness lightheadedness.  Due to get third unit of blood today.  Walking around.   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   Hemoglobin 4.7-- 2 units of PRBC-6.2-1 more unit of PRBC today. Patient started on high-dose steroids 80 mg daily On IVIG day 2 today. Oncology following  Polyneuropathy: Continue gabapentin  and Norco Hypothyroidism: On Synthroid  GERD, on PPI   DVT prophylaxis: SCDs Start: 02/20/24 1126   Code Status: Full code Family Communication: None at the bedside Disposition Plan: Status is: Inpatient Remains inpatient appropriate because: Blood transfusions, IVIG transfusion     Consultants:  Hematology  Procedures:  None  Antimicrobials:  None     Objective: Vitals:   02/22/24 0100 02/22/24 0200 02/22/24 0300 02/22/24 1144  BP:  131/63 135/71 130/74  Pulse:    79  Resp:  19  18  Temp:  98.7 F (37.1 C)  98.2 F (36.8 C)  TempSrc:  Oral    SpO2: 98% 99%  100%  Weight:      Height:        Intake/Output Summary (Last 24 hours) at 02/22/2024 1144 Last data filed at 02/21/2024 2200 Gross per 24 hour  Intake 290 ml  Output  300 ml  Net -10 ml   Filed Weights   02/20/24 1219 02/20/24 2136  Weight: 73.9 kg 74.7 kg    Examination:  General exam: Comfortable.  Pleasant interactive.  Slightly anxious today.  Tearful. Respiratory system: Clear to auscultation. Respiratory effort normal. Cardiovascular system: S1 & S2 heard, RRR. No JVD, murmurs, rubs, gallops or clicks. No pedal edema. Gastrointestinal system: Abdomen is nondistended, soft and nontender. No organomegaly or masses felt. Normal bowel sounds heard. Central nervous system: Alert and oriented. No focal neurological deficits.     Data Reviewed: I have personally reviewed following labs and imaging studies  CBC: Recent Labs  Lab 02/20/24 0830 02/20/24 1848 02/21/24 0336 02/21/24 1632 02/21/24 2237 02/22/24 0423  WBC 10.3 9.8 11.3* 10.2  --  9.4  NEUTROABS 7.0 7.8* 8.6* 8.5*  --  7.2  HGB 5.3* 5.1* 4.7* 6.4* 6.6* 6.2*  HCT 16.9* 16.8* 16.6* 20.7* 21.9* 22.0*  MCV 111.9* 116.7* 122.1* 108.9*  --  122.2*  PLT 329 351 293 314  --  260   Basic Metabolic Panel: Recent Labs  Lab 02/20/24 0830 02/20/24 0850 02/21/24 0336 02/22/24 0423  NA  --  128* 136 133*  K  --  3.7 4.1 3.6  CL  --  104 112* 106  CO2  --  20* 18* 18*  GLUCOSE  --  126* 137* 104*  BUN  --  15 13 17   CREATININE  --  0.57  0.57 0.69  CALCIUM   --  8.1* 8.5* 8.5*  MG 1.8  --   --   --   PHOS 2.7  --   --   --    GFR: Estimated Creatinine Clearance: 61.5 mL/min (by C-G formula based on SCr of 0.69 mg/dL). Liver Function Tests: Recent Labs  Lab 02/20/24 0850 02/20/24 0854 02/21/24 0336 02/22/24 0423  AST 29  --  24 28  ALT 14  --  13 15  ALKPHOS 59  --  51 46  BILITOT 5.4* 4.9* 1.8* 1.3*  PROT 6.5  --  7.5 8.5*  ALBUMIN 3.9  --  3.4* 3.3*   Recent Labs  Lab 02/20/24 0830  LIPASE 32   Recent Labs  Lab 02/20/24 0851  AMMONIA 14   Coagulation Profile: Recent Labs  Lab 02/20/24 0830  INR 1.1   Cardiac Enzymes: No results for input(s): "CKTOTAL",  "CKMB", "CKMBINDEX", "TROPONINI" in the last 168 hours. BNP (last 3 results) No results for input(s): "PROBNP" in the last 8760 hours. HbA1C: Recent Labs    02/21/24 0327  HGBA1C 4.5*   CBG: Recent Labs  Lab 02/21/24 1147 02/21/24 1618 02/21/24 2120 02/22/24 0735 02/22/24 1137  GLUCAP 126* 139* 158* 93 124*   Lipid Profile: No results for input(s): "CHOL", "HDL", "LDLCALC", "TRIG", "CHOLHDL", "LDLDIRECT" in the last 72 hours. Thyroid  Function Tests: Recent Labs    02/20/24 0853  TSH 4.876*   Anemia Panel: Recent Labs    02/20/24 1100 02/21/24 0336 02/22/24 0423  VITAMINB12 268  --   --   FOLATE 16.8  --   --   FERRITIN 358*  --   --   TIBC 318  --   --   IRON 152  --   --   RETICCTPCT 6.2* 7.2* 11.3*   Sepsis Labs: Recent Labs  Lab 02/20/24 0909 02/20/24 1117  LATICACIDVEN 2.0* 1.3    Recent Results (from the past 240 hours)  MRSA Next Gen by PCR, Nasal     Status: None   Collection Time: 02/20/24  9:58 PM   Specimen: Nasal Mucosa; Nasal Swab  Result Value Ref Range Status   MRSA by PCR Next Gen NOT DETECTED NOT DETECTED Final    Comment: (NOTE) The GeneXpert MRSA Assay (FDA approved for NASAL specimens only), is one component of a comprehensive MRSA colonization surveillance program. It is not intended to diagnose MRSA infection nor to guide or monitor treatment for MRSA infections. Test performance is not FDA approved in patients less than 56 years old. Performed at [facility], [address] [place], [place] [address]          Radiology Studies: No results found.       Scheduled Meds:  sodium chloride    Intravenous Once   Chlorhexidine  Gluconate Cloth  6 each Topical Daily   famotidine   20 mg Oral BID   folic acid   2 mg Oral Daily   furosemide  20 mg Intravenous Once   insulin aspart  0-15 Units Subcutaneous TID WC   levothyroxine   112 mcg Oral QAC breakfast   polycarbophil  625 mg Oral BID   predniSONE   80  mg Oral Q breakfast   sertraline   100 mg Oral Daily   Continuous Infusions:     LOS: 2 days    Time spent: 55 minutes    Vada Garibaldi, MD [facility]

## 2024-02-23 ENCOUNTER — Encounter: Payer: Self-pay | Admitting: Hematology & Oncology

## 2024-02-23 DIAGNOSIS — D599 Acquired hemolytic anemia, unspecified: Secondary | ICD-10-CM

## 2024-02-23 DIAGNOSIS — D591 Autoimmune hemolytic anemia, unspecified: Secondary | ICD-10-CM | POA: Diagnosis not present

## 2024-02-23 LAB — COMPREHENSIVE METABOLIC PANEL WITH GFR
ALT: 18 U/L (ref 0–44)
AST: 24 U/L (ref 15–41)
Albumin: 3.3 g/dL — ABNORMAL LOW (ref 3.5–5.0)
Alkaline Phosphatase: 45 U/L (ref 38–126)
Anion gap: 7 (ref 5–15)
BUN: 23 mg/dL (ref 8–23)
CO2: 20 mmol/L — ABNORMAL LOW (ref 22–32)
Calcium: 8.6 mg/dL — ABNORMAL LOW (ref 8.9–10.3)
Chloride: 107 mmol/L (ref 98–111)
Creatinine, Ser: 0.66 mg/dL (ref 0.44–1.00)
GFR, Estimated: >60 mL/min (ref >60)
Glucose, Bld: 101 mg/dL — ABNORMAL HIGH (ref 70–99)
Potassium: 3.6 mmol/L (ref 3.5–5.1)
Sodium: 134 mmol/L — ABNORMAL LOW (ref 135–145)
Total Bilirubin: 1.4 mg/dL — ABNORMAL HIGH (ref 0.0–1.2)
Total Protein: 8.1 g/dL (ref 6.5–8.1)

## 2024-02-23 LAB — TYPE AND SCREEN
ABO/RH(D): A NEG
Antibody Screen: POSITIVE
DAT, IgG: POSITIVE
Unit division: 0
Unit division: 0
Unit division: 0

## 2024-02-23 LAB — BPAM RBC
Blood Product Expiration Date: 202506202359
Blood Product Expiration Date: 202506212359
Blood Product Expiration Date: 202507082359
ISSUE DATE / TIME: 202506021947
ISSUE DATE / TIME: 202506030635
ISSUE DATE / TIME: 202506041148
Unit Type and Rh: 600
Unit Type and Rh: 600
Unit Type and Rh: 600

## 2024-02-23 LAB — CBC WITH DIFFERENTIAL/PLATELET
Abs Immature Granulocytes: 0.09 10*3/uL — ABNORMAL HIGH (ref 0.00–0.07)
Basophils Absolute: 0 10*3/uL (ref 0.0–0.1)
Basophils Relative: 0 %
Eosinophils Absolute: 0 10*3/uL (ref 0.0–0.5)
Eosinophils Relative: 0 %
HCT: 26.7 % — ABNORMAL LOW (ref 36.0–46.0)
Hemoglobin: 8.1 g/dL — ABNORMAL LOW (ref 12.0–15.0)
Immature Granulocytes: 1 %
Lymphocytes Relative: 22 %
Lymphs Abs: 1.6 10*3/uL (ref 0.7–4.0)
MCH: 34.6 pg — ABNORMAL HIGH (ref 26.0–34.0)
MCHC: 30.3 g/dL (ref 30.0–36.0)
MCV: 114.1 fL — ABNORMAL HIGH (ref 80.0–100.0)
Monocytes Absolute: 0.5 10*3/uL (ref 0.1–1.0)
Monocytes Relative: 7 %
Neutro Abs: 5.2 10*3/uL (ref 1.7–7.7)
Neutrophils Relative %: 70 %
Platelets: 225 10*3/uL (ref 150–400)
RBC: 2.34 MIL/uL — ABNORMAL LOW (ref 3.87–5.11)
RDW: 21.4 % — ABNORMAL HIGH (ref 11.5–15.5)
Smear Review: NORMAL
WBC: 7.4 10*3/uL (ref 4.0–10.5)
nRBC: 1.1 % — ABNORMAL HIGH (ref 0.0–0.2)

## 2024-02-23 LAB — RETICULOCYTES
Immature Retic Fract: 40.9 % — ABNORMAL HIGH (ref 2.3–15.9)
RBC.: 2.27 MIL/uL — ABNORMAL LOW (ref 3.87–5.11)
Retic Count, Absolute: 305.1 10*3/uL — ABNORMAL HIGH (ref 19.0–186.0)
Retic Ct Pct: 13.4 % — ABNORMAL HIGH (ref 0.4–3.1)

## 2024-02-23 LAB — GLUCOSE, CAPILLARY: Glucose-Capillary: 87 mg/dL (ref 70–99)

## 2024-02-23 LAB — LACTATE DEHYDROGENASE: LDH: 266 U/L — ABNORMAL HIGH (ref 98–192)

## 2024-02-23 MED ORDER — FAMOTIDINE 20 MG PO TABS: 20.0000 mg | ORAL_TABLET | Freq: Two times a day (BID) | ORAL | 0 refills | Status: DC

## 2024-02-23 MED ORDER — FAMCICLOVIR 500 MG PO TABS: 250.0000 mg | ORAL_TABLET | Freq: Every day | ORAL | 0 refills | Status: DC

## 2024-02-23 MED ORDER — PREDNISONE 20 MG PO TABS: 80.0000 mg | ORAL_TABLET | Freq: Every day | ORAL | 0 refills | Status: AC

## 2024-02-23 MED ORDER — FAMCICLOVIR 500 MG PO TABS
250.0000 mg | ORAL_TABLET | Freq: Every day | ORAL | Status: DC
  Administered 2024-02-23: 250 mg via ORAL
  Filled 2024-02-23: qty 0.5

## 2024-02-23 NOTE — Plan of Care (Signed)
 Problem: Education: Goal: Ability to describe self-care measures that may prevent or decrease complications (Diabetes Survival Skills Education) will improve 02/23/2024 1000 by Coletta Davidson, RN Outcome: Adequate for Discharge 02/23/2024 [phone] by Coletta Davidson, RN Outcome: Adequate for Discharge Goal: Individualized Educational Video(s) 02/23/2024 1000 by Coletta Davidson, RN Outcome: Adequate for Discharge 02/23/2024 [phone] by Coletta Davidson, RN Outcome: Adequate for Discharge   Problem: Coping: Goal: Ability to adjust to condition or change in health will improve 02/23/2024 1000 by Coletta Davidson, RN Outcome: Adequate for Discharge 02/23/2024 [phone] by Coletta Davidson, RN Outcome: Adequate for Discharge   Problem: Fluid Volume: Goal: Ability to maintain a balanced intake and output will improve 02/23/2024 1000 by Coletta Davidson, RN Outcome: Adequate for Discharge 02/23/2024 [phone] by Coletta Davidson, RN Outcome: Adequate for Discharge   Problem: Health Behavior/Discharge Planning: Goal: Ability to identify and utilize available resources and services will improve 02/23/2024 1000 by Coletta Davidson, RN Outcome: Adequate for Discharge 02/23/2024 [phone] by Coletta Davidson, RN Outcome: Adequate for Discharge Goal: Ability to manage health-related needs will improve 02/23/2024 1000 by Coletta Davidson, RN Outcome: Adequate for Discharge 02/23/2024 [phone] by Coletta Davidson, RN Outcome: Adequate for Discharge   Problem: Metabolic: Goal: Ability to maintain appropriate glucose levels will improve 02/23/2024 1000 by Coletta Davidson, RN Outcome: Adequate for Discharge 02/23/2024 [phone] by Coletta Davidson, RN Outcome: Adequate for Discharge   Problem: Nutritional: Goal: Maintenance of adequate nutrition will improve 02/23/2024 1000 by Coletta Davidson, RN Outcome: Adequate for Discharge 02/23/2024 [phone] by Coletta Davidson, RN Outcome:  Adequate for Discharge Goal: Progress toward achieving an optimal weight will improve 02/23/2024 1000 by Coletta Davidson, RN Outcome: Adequate for Discharge 02/23/2024 [phone] by Coletta Davidson, RN Outcome: Adequate for Discharge   Problem: Skin Integrity: Goal: Risk for impaired skin integrity will decrease 02/23/2024 1000 by Coletta Davidson, RN Outcome: Adequate for Discharge 02/23/2024 [phone] by Coletta Davidson, RN Outcome: Adequate for Discharge   Problem: Tissue Perfusion: Goal: Adequacy of tissue perfusion will improve 02/23/2024 1000 by Coletta Davidson, RN Outcome: Adequate for Discharge 02/23/2024 [phone] by Coletta Davidson, RN Outcome: Adequate for Discharge   Problem: Education: Goal: Knowledge of General Education information will improve Description: Including pain rating scale, medication(s)/side effects and non-pharmacologic comfort measures 02/23/2024 1000 by Coletta Davidson, RN Outcome: Adequate for Discharge 02/23/2024 [phone] by Coletta Davidson, RN Outcome: Adequate for Discharge   Problem: Health Behavior/Discharge Planning: Goal: Ability to manage health-related needs will improve 02/23/2024 1000 by Coletta Davidson, RN Outcome: Adequate for Discharge 02/23/2024 [phone] by Coletta Davidson, RN Outcome: Adequate for Discharge   Problem: Clinical Measurements: Goal: Ability to maintain clinical measurements within normal limits will improve 02/23/2024 1000 by Coletta Davidson, RN Outcome: Adequate for Discharge 02/23/2024 [phone] by Coletta Davidson, RN Outcome: Adequate for Discharge Goal: Will remain free from infection 02/23/2024 1000 by Coletta Davidson, RN Outcome: Adequate for Discharge 02/23/2024 [phone] by Coletta Davidson, RN Outcome: Adequate for Discharge Goal: Diagnostic test results will improve 02/23/2024 1000 by Coletta Davidson, RN Outcome: Adequate for Discharge 02/23/2024 [phone] by Coletta Davidson, RN Outcome:  Adequate for Discharge Goal: Respiratory complications will improve 02/23/2024 1000 by Coletta Davidson, RN Outcome: Adequate for Discharge 02/23/2024 [phone] by Coletta Davidson, RN Outcome: Adequate for Discharge Goal: Cardiovascular complication will be avoided 02/23/2024 1000 by Coletta Davidson, RN Outcome:  Adequate for Discharge 02/23/2024 0841 by Coletta Davidson, RN Outcome: Adequate for Discharge   Problem: Activity: Goal: Risk for activity intolerance will decrease 02/23/2024 1000 by Coletta Davidson, RN Outcome: Adequate for Discharge 02/23/2024 [phone] by Coletta Davidson, RN Outcome: Adequate for Discharge   Problem: Nutrition: Goal: Adequate nutrition will be maintained 02/23/2024 1000 by Coletta Davidson, RN Outcome: Adequate for Discharge 02/23/2024 [phone] by Coletta Davidson, RN Outcome: Adequate for Discharge   Problem: Coping: Goal: Level of anxiety will decrease 02/23/2024 1000 by Coletta Davidson, RN Outcome: Adequate for Discharge 02/23/2024 [phone] by Coletta Davidson, RN Outcome: Adequate for Discharge   Problem: Elimination: Goal: Will not experience complications related to bowel motility 02/23/2024 1000 by Coletta Davidson, RN Outcome: Adequate for Discharge 02/23/2024 [phone] by Coletta Davidson, RN Outcome: Adequate for Discharge Goal: Will not experience complications related to urinary retention 02/23/2024 1000 by Coletta Davidson, RN Outcome: Adequate for Discharge 02/23/2024 [phone] by Coletta Davidson, RN Outcome: Adequate for Discharge   Problem: Pain Managment: Goal: General experience of comfort will improve and/or be controlled 02/23/2024 1000 by Coletta Davidson, RN Outcome: Adequate for Discharge 02/23/2024 [phone] by Coletta Davidson, RN Outcome: Adequate for Discharge   Problem: Safety: Goal: Ability to remain free from injury will improve 02/23/2024 1000 by Coletta Davidson, RN Outcome: Adequate for  Discharge 02/23/2024 [phone] by Coletta Davidson, RN Outcome: Adequate for Discharge   Problem: Skin Integrity: Goal: Risk for impaired skin integrity will decrease 02/23/2024 1000 by Coletta Davidson, RN Outcome: Adequate for Discharge 02/23/2024 [phone] by Coletta Davidson, RN Outcome: Adequate for Discharge

## 2024-02-23 NOTE — Plan of Care (Signed)

## 2024-02-23 NOTE — Progress Notes (Signed)
 [name]  feels okay this morning.  She has little bit of a headache.  This certainly could be from the IVIG that she is seeing.  She apparently got a unit of blood yesterday from what she tells me.  Her CBC today shows a white cell count of 7 .4  Hemoglobin 8.1.  Platelet count 225,000.  Her LDH is 266.  Total bilirubin 1.4 reticulocyte count is 13.4%.  She had the splenic ultrasound done yesterday.  This did not show any splenomegaly.  She really would like to go home today.  I think we can probably get her home today.  I will have her come to the office tomorrow.  I may give her Rituxan  in the office tomorrow.  I still have thought about getting her spleen taken out.  I know this probably would be curative.  However it is a major surgical undertaking.  I will go ahead and get her on Famvir .  She needs to be on Famvir  as an outpatient.  She needs to be on Pepcid  as an outpatient.  She needs to be on folic acid  as an outpatient.  She has had no bleeding.  She has had no problems with bowels or bladder.  She has had no rashes.  She has had no swollen lymph nodes.  Her vital signs show temperature of 97.7.  Pulse 51.  Blood pressure 127/57.  Her head neck exam shows no thrush.  She has no adenopathy in the neck.  She has no scleral icterus.  She has good breath sounds bilaterally.  She has good air movement bilaterally.  Cardiac exam regular rate and rhythm.  Abdomen is soft.  Bowel sounds are present.  There is no palpable liver or spleen tip.  Extremity shows no clubbing, cyanosis or edema.  Neurological exam is nonfocal.   I think that we can probably get [name] home.  Again she needs to try to be home to help with her poor husband who has dementia.  Again we will get her back to the office on Friday.  I will have to see about given her Rituxan  in the office.  I would do this weekly.  We had to use this before and it seemed to work quite well.  I appreciate everybody's help.  I know  everybody worked incredibly hard to stabilize her and try to get her blood better.   [name], MD  Monika Annas 6:9-10

## 2024-02-23 NOTE — Discharge Summary (Signed)
 Physician Discharge Summary  TELETHA [name] HQI:[id number] DOB: 02-17-1946 DOA: 02/20/2024  PCP: Dudley Ghee, MD  Admit date: 02/20/2024 Discharge date: 02/23/2024  Admitted From: Home Disposition: Home  Recommendations for Outpatient Follow-up:  Oncology to schedule regular follow-up.  Next follow-up tomorrow.  Home Health: N/A Equipment/Devices: N/A  Discharge Condition: Stable CODE STATUS: Full code Diet recommendation: Regular diet  Discharge summary: 78 year old with history of anxiety, osteoarthritis, history of DVT, GERD, hypothyroidism and melanoma of the right upper thigh, peripheral neuropathy, history of hemolytic anemia in 2022 presented to the emergency room with headache and dizziness for past few days, looking jaundiced. In the emergency room hemodynamically stable. WBC count 10.3. Hemoglobin 5.3. Reticulocyte 6.2. PT/INR normal. Lactic acid 1.3. LDH 348. Troponins normal.  Patient was treated with IVIG x 3, high-dose prednisone  and 3 units of PRBC transfusion.  She is clinically stabilizing.  Seen by oncology.  She is going home today on prednisone  with outpatient follow-up plan and possibly with Rituxan  injection.  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   Hemoglobin 4.7--total 3 units of PRBC transfusion-hemoglobin 8.  Appropriately responded.  No evidence of splenomegaly. Patient started on high-dose steroids 80 mg daily On IVIG day 3 . Oncology following. With patient's clinical stability, patient is going home on prednisone  80 mg daily until further change in 1 dose as outpatient. GI prophylaxis with Pepcid  20 mg twice daily. Infection prophylaxis with famciclovir  250 mg daily. Follow-up tomorrow at oncology clinic, probable Rituxan  injection.   Polyneuropathy: Continue gabapentin  and Norco Hypothyroidism: On Synthroid  GERD, on PPI  Medically stable for discharge.  Discharge Diagnoses:  Principal Problem:    Hemolytic anemia, acute (HCC) Active Problems:   Polyneuropathy   Hypothyroidism   OSA (obstructive sleep apnea)   Gastroesophageal reflux disease    Discharge Instructions  Discharge Instructions     Diet - low sodium heart healthy   Complete by: As directed    Increase activity slowly   Complete by: As directed    Clinic Appointment Request   Complete by: Feb 24, 2024    Contact your oncology clinic or infusion center to schedule this appointment.   infusion appointment request (330 min)   Complete by: Feb 24, 2024    Contact your oncology clinic or infusion center to schedule this appointment.   Clinic Appointment Request   Complete by: Mar 02, 2024    Contact your oncology clinic or infusion center to schedule this appointment.   Infusion Appointment Request   Complete by: Mar 02, 2024    Contact your oncology clinic or infusion center to schedule this appointment.   Clinic Appointment Request   Complete by: Mar 09, 2024    Contact your oncology clinic or infusion center to schedule this appointment.   Infusion Appointment Request   Complete by: Mar 09, 2024    Contact your oncology clinic or infusion center to schedule this appointment.   Clinic Appointment Request   Complete by: Mar 16, 2024    Contact your oncology clinic or infusion center to schedule this appointment.   Infusion Appointment Request   Complete by: Mar 16, 2024    Contact your oncology clinic or infusion center to schedule this appointment.      Allergies as of 02/23/2024       Reactions   Oxycodone Other (See Comments)   Hallucinations    Aspirin  Other (See Comments)   Interferes with headaches Other Reaction(s): stomach issues   Imipenem  Other (See Comments)   Warm and tingly all over   Methocarbamol  Rash   Statins Other (See Comments)   leg pain, muscle cramps   Losartan Other (See Comments)   Pt unsure   Zosyn [piperacillin-tazobactam In Dex]    Cephalexin Other (See Comments)    Stomach cramps   Clindamycin /lincomycin Nausea And Vomiting, Rash   Contrast Media [iodinated Contrast Media] Rash   Losartan Potassium-hctz Other (See Comments)   Unknown Reaction - pt is unaware of this Other Reaction(s): do not remember   Methylprednisolone  Rash   Rash on face   Penicillins Rash      Quinolones Rash        Medication List     PAUSE taking these medications    clopidogrel  75 MG tablet Wait to take this until: March 01, 2024 Commonly known as: PLAVIX  Take 75 mg by mouth daily.       TAKE these medications    acetaminophen  500 MG tablet Commonly known as: TYLENOL  Take 1,000 mg by mouth every 4 (four) hours as needed for headache.   Calcium  Carbonate+Vitamin D  600-200 MG-UNIT Tabs Take 1 tablet by mouth 2 (two) times daily.   diphenhydrAMINE  25 MG tablet Commonly known as: BENADRYL  Take 25-50 mg by mouth every 6 (six) hours as needed for allergies.   EQ Restore Plus Lubricant Eye 0.5 % Soln Generic drug: Carboxymethylcellulose Sod PF Place 1 drop into both eyes daily as needed (Dry eye).   estradiol 0.1 MG/GM vaginal cream Commonly known as: ESTRACE Place 1 Applicatorful vaginally at bedtime.   famciclovir  500 MG tablet Commonly known as: FAMVIR  Take 0.5 tablets (250 mg total) by mouth daily.   famotidine  20 MG tablet Commonly known as: PEPCID  Take 1 tablet (20 mg total) by mouth 2 (two) times daily.   folic acid  1 MG tablet Commonly known as: FOLVITE  Take 2 tablets (2 mg total) by mouth daily. What changed:  how much to take when to take this   gabapentin  300 MG capsule Commonly known as: NEURONTIN  Take 1-2 capsules (300-600 mg total) by mouth 4 (four) times daily as needed (max. 2400mg  daily).   HYDROcodone -acetaminophen  5-325 MG tablet Commonly known as: NORCO/VICODIN Take 1 tablet by mouth 2 (two) times daily.   hydrocortisone 2.5 % cream Apply 1 Application topically daily as needed (Irritation). On face   levothyroxine   112 MCG tablet Commonly known as: SYNTHROID  Take 112 mcg by mouth daily before breakfast.   LORazepam  2 MG tablet Commonly known as: ATIVAN  Take 2 mg by mouth 3 (three) times daily as needed for anxiety.   MELATONIN PO Take 5 mg by mouth at bedtime.   methocarbamol  750 MG tablet Commonly known as: ROBAXIN  Take 750 mg by mouth 2 (two) times daily as needed for muscle spasms.   multivitamin with minerals tablet Take 1 tablet by mouth daily. Complete   polycarbophil 625 MG tablet Commonly known as: FIBERCON Take 625 mg by mouth 2 (two) times daily.   predniSONE  20 MG tablet Commonly known as: DELTASONE  Take 4 tablets (80 mg total) by mouth daily with breakfast for 14 days. Start taking on: February 24, 2024   sertraline  100 MG tablet Commonly known as: ZOLOFT  Take 100 mg by mouth daily.        Allergies  Allergen Reactions   Oxycodone Other (See Comments)    Hallucinations    Aspirin  Other (See Comments)    Interferes with headaches  Other Reaction(s): stomach issues   Imipenem  Other (See Comments)    Warm and tingly all over   Methocarbamol  Rash   Statins Other (See Comments)    leg pain, muscle cramps   Losartan Other (See Comments)    Pt unsure   Zosyn [Piperacillin-Tazobactam In Dex]    Cephalexin Other (See Comments)    Stomach cramps   Clindamycin /Lincomycin Nausea And Vomiting and Rash   Contrast Media [Iodinated Contrast Media] Rash   Losartan Potassium-Hctz Other (See Comments)    Unknown Reaction - pt is unaware of this  Other Reaction(s): do not remember   Methylprednisolone  Rash    Rash on face   Penicillins Rash        Quinolones Rash    Consultations: Oncology   Procedures/Studies: US  Abdomen Limited Result Date: 02/22/2024 CLINICAL DATA:  Hemolytic anemia. EXAM: ULTRASOUND ABDOMEN LIMITED COMPARISON:  None Available. FINDINGS: Targeted sonographic images of the left upper quadrant for evaluation of the spleen was performed. The spleen  appears unremarkable and measures 10.7 x 4.2 x 11.7 cm for a volume of 277 cc. IMPRESSION: Unremarkable spleen. Electronically Signed   By: Angus Bark M.D.   On: 02/22/2024 17:48   DG Chest Port 1 View Result Date: 02/20/2024 CLINICAL DATA:  Weakness and shortness of breath. Intermittent headaches and dizziness for the past few days. EXAM: PORTABLE CHEST 1 VIEW COMPARISON:  Radiographs 07/28/2021 and 02/19/2021.  CT 09/11/2010. FINDINGS: 0908 hours. The heart size and mediastinal contours are stable with mild aortic atherosclerosis. The lungs appear clear. No pleural effusion or pneumothorax. No acute osseous findings status post right shoulder reverse arthroplasty. Left shoulder degenerative changes are noted. Telemetry leads overlie the chest. IMPRESSION: No evidence of acute cardiopulmonary process. Aortic atherosclerosis. Electronically Signed   By: Elmon Hagedorn M.D.   On: 02/20/2024 09:58   CT Head Wo Contrast Result Date: 02/20/2024 CLINICAL DATA:  78 year old female with intermittent headache and dizziness for several days. Stopped taking Plavix  late last month prior to a procedure. EXAM: CT HEAD WITHOUT CONTRAST TECHNIQUE: Contiguous axial images were obtained from the base of the skull through the vertex without intravenous contrast. RADIATION DOSE REDUCTION: This exam was performed according to the departmental dose-optimization program which includes automated exposure control, adjustment of the mA and/or kV according to patient size and/or use of iterative reconstruction technique. COMPARISON:  Brain MRI 08/18/2021. FINDINGS: Brain: Multifocal chronic infarcts and cortical encephalomalacia in the anterior left ACA/MCA watershed, posterior right MCA territory, superior left PCA territory are stable from the previous brain MRI. No midline shift, ventriculomegaly, mass effect, evidence of mass lesion, intracranial hemorrhage or evidence of cortically based acute infarction. Vascular: Calcified  atherosclerosis at the skull base. No suspicious intracranial vascular hyperdensity. Skull: Intact, negative. Sinuses/Orbits: Tympanic cavities, visualized paranasal sinuses and mastoids are clear. Other: No acute orbit or scalp soft tissue finding. IMPRESSION: 1. No acute intracranial abnormality. 2. Stable multifocal chronic cerebral infarcts since a 2022 MRI. Electronically Signed   By: Marlise Simpers M.D.   On: 02/20/2024 09:53   (Echo, Carotid, EGD, Colonoscopy, ERCP)    Subjective: Patient seen and examined.  She had some headache but denies any other complaints.  Her husband has dementia and needs a lot of support at home and he is alone.  She is more anxious about going home and taking care of him.  Case discussed with oncology for discharge readiness.   Discharge Exam: Vitals:   02/22/24 2130 02/23/24 0519  BP: (!) 118/58 (!) 127/57  Pulse: 62 (!) 51  Resp: 18   Temp: 98.3 F (36.8 C) 97.7 F (36.5 C)  SpO2: 97% 98%   Vitals:   02/22/24 1510 02/22/24 1600 02/22/24 2130 02/23/24 0519  BP: 126/67  (!) 118/58 (!) 127/57  Pulse: (!) 56  62 (!) 51  Resp: 16 16 18    Temp: 98.2 F (36.8 C)  98.3 F (36.8 C) 97.7 F (36.5 C)  TempSrc: Oral   Oral  SpO2: 98%  97% 98%  Weight:      Height:        General: Pt is alert, awake, not in acute distress Cardiovascular: RRR, S1/S2 +, no rubs, no gallops Respiratory: CTA bilaterally, no wheezing, no rhonchi Abdominal: Soft, NT, ND, bowel sounds + Extremities: no edema, no cyanosis    The results of significant diagnostics from this hospitalization (including imaging, microbiology, ancillary and laboratory) are listed below for reference.     Microbiology: Recent Results (from the past 240 hours)  MRSA Next Gen by PCR, Nasal     Status: None   Collection Time: 02/20/24  9:58 PM   Specimen: Nasal Mucosa; Nasal Swab  Result Value Ref Range Status   MRSA by PCR Next Gen NOT DETECTED NOT DETECTED Final    Comment: (NOTE) The GeneXpert  MRSA Assay (FDA approved for NASAL specimens only), is one component of a comprehensive MRSA colonization surveillance program. It is not intended to diagnose MRSA infection nor to guide or monitor treatment for MRSA infections. Test performance is not FDA approved in patients less than 82 years old. Performed at [facility], [address] [place], [place] [address]      Labs: BNP (last 3 results) No results for input(s): "BNP" in the last 8760 hours. Basic Metabolic Panel: Recent Labs  Lab 02/20/24 0830 02/20/24 0850 02/21/24 0336 02/22/24 0423 02/23/24 0406  NA  --  128* 136 133* 134*  K  --  3.7 4.1 3.6 3.6  CL  --  104 112* 106 107  CO2  --  20* 18* 18* 20*  GLUCOSE  --  126* 137* 104* 101*  BUN  --  15 13 17 23   CREATININE  --  0.57 0.57 0.69 0.66  CALCIUM   --  8.1* 8.5* 8.5* 8.6*  MG 1.8  --   --   --   --   PHOS 2.7  --   --   --   --    Liver Function Tests: Recent Labs  Lab 02/20/24 0850 02/20/24 0854 02/21/24 0336 02/22/24 0423 02/23/24 0406  AST 29  --  24 28 24   ALT 14  --  13 15 18   ALKPHOS 59  --  51 46 45  BILITOT 5.4* 4.9* 1.8* 1.3* 1.4*  PROT 6.5  --  7.5 8.5* 8.1  ALBUMIN 3.9  --  3.4* 3.3* 3.3*   Recent Labs  Lab 02/20/24 0830  LIPASE 32   Recent Labs  Lab 02/20/24 0851  AMMONIA 14   CBC: Recent Labs  Lab 02/21/24 0336 02/21/24 1632 02/21/24 2237 02/22/24 0423 02/22/24 1640 02/23/24 0406  WBC 11.3* 10.2  --  9.4 8.4 7.4  NEUTROABS 8.6* 8.5*  --  7.2 7.3 5.2  HGB 4.7* 6.4* 6.6* 6.2* 8.1* 8.1*  HCT 16.6* 20.7* 21.9* 22.0* 25.9* 26.7*  MCV 122.1* 108.9*  --  122.2* 109.7* 114.1*  PLT 293 314  --  260 273 225   Cardiac Enzymes: No results for input(s): "CKTOTAL", "CKMB", "CKMBINDEX", "TROPONINI" in the  last 168 hours. BNP: Invalid input(s): "POCBNP" CBG: Recent Labs  Lab 02/22/24 0735 02/22/24 1137 02/22/24 1637 02/22/24 2129 02/23/24 0737  GLUCAP 93 124* 179* 145* 87   D-Dimer No results for  input(s): "DDIMER" in the last 72 hours. Hgb A1c Recent Labs    02/21/24 0327  HGBA1C 4.5*   Lipid Profile No results for input(s): "CHOL", "HDL", "LDLCALC", "TRIG", "CHOLHDL", "LDLDIRECT" in the last 72 hours. Thyroid  function studies Recent Labs    02/20/24 0853  TSH 4.876*   Anemia work up Recent Labs    02/20/24 1100 02/21/24 0336 02/22/24 0423 02/23/24 0406  VITAMINB12 268  --   --   --   FOLATE 16.8  --   --   --   FERRITIN 358*  --   --   --   TIBC 318  --   --   --   IRON 152  --   --   --   RETICCTPCT 6.2*   < > 11.3* 13.4*   < > = values in this interval not displayed.   Urinalysis    Component Value Date/Time   COLORURINE YELLOW 02/21/2024 0015   APPEARANCEUR CLEAR 02/21/2024 0015   LABSPEC 1.005 02/21/2024 0015   PHURINE 6.0 02/21/2024 0015   GLUCOSEU NEGATIVE 02/21/2024 0015   HGBUR NEGATIVE 02/21/2024 0015   BILIRUBINUR NEGATIVE 02/21/2024 0015   KETONESUR NEGATIVE 02/21/2024 0015   PROTEINUR NEGATIVE 02/21/2024 0015   UROBILINOGEN 0.2 06/28/2010 0430   NITRITE NEGATIVE 02/21/2024 0015   LEUKOCYTESUR NEGATIVE 02/21/2024 0015   Sepsis Labs Recent Labs  Lab 02/21/24 1632 02/22/24 0423 02/22/24 1640 02/23/24 0406  WBC 10.2 9.4 8.4 7.4   Microbiology Recent Results (from the past 240 hours)  MRSA Next Gen by PCR, Nasal     Status: None   Collection Time: 02/20/24  9:58 PM   Specimen: Nasal Mucosa; Nasal Swab  Result Value Ref Range Status   MRSA by PCR Next Gen NOT DETECTED NOT DETECTED Final    Comment: (NOTE) The GeneXpert MRSA Assay (FDA approved for NASAL specimens only), is one component of a comprehensive MRSA colonization surveillance program. It is not intended to diagnose MRSA infection nor to guide or monitor treatment for MRSA infections. Test performance is not FDA approved in patients less than 63 years old. Performed at [facility], [address] [place], [place] [address]      Time coordinating  discharge: 40 minutes  SIGNED:   Vada Garibaldi, MD  [facility] 02/23/2024, 8:33 AM

## 2024-02-24 ENCOUNTER — Inpatient Hospital Stay: Attending: Hematology & Oncology | Admitting: Hematology & Oncology

## 2024-02-24 ENCOUNTER — Inpatient Hospital Stay

## 2024-02-24 ENCOUNTER — Encounter: Payer: Self-pay | Admitting: Hematology & Oncology

## 2024-02-24 VITALS — BP 132/71 | HR 55 | Temp 98.7°F | Resp 20 | Ht 65.0 in | Wt 161.0 lb

## 2024-02-24 DIAGNOSIS — Z79624 Long term (current) use of inhibitors of nucleotide synthesis: Secondary | ICD-10-CM | POA: Insufficient documentation

## 2024-02-24 DIAGNOSIS — Z7952 Long term (current) use of systemic steroids: Secondary | ICD-10-CM | POA: Insufficient documentation

## 2024-02-24 DIAGNOSIS — Z5112 Encounter for antineoplastic immunotherapy: Secondary | ICD-10-CM | POA: Diagnosis present

## 2024-02-24 DIAGNOSIS — D5911 Warm autoimmune hemolytic anemia: Secondary | ICD-10-CM | POA: Insufficient documentation

## 2024-02-24 DIAGNOSIS — Z79890 Hormone replacement therapy (postmenopausal): Secondary | ICD-10-CM | POA: Diagnosis not present

## 2024-02-24 DIAGNOSIS — Z7902 Long term (current) use of antithrombotics/antiplatelets: Secondary | ICD-10-CM | POA: Insufficient documentation

## 2024-02-24 DIAGNOSIS — D599 Acquired hemolytic anemia, unspecified: Secondary | ICD-10-CM | POA: Diagnosis not present

## 2024-02-24 DIAGNOSIS — Z79899 Other long term (current) drug therapy: Secondary | ICD-10-CM | POA: Diagnosis not present

## 2024-02-24 LAB — CBC WITH DIFFERENTIAL (CANCER CENTER ONLY)
Abs Immature Granulocytes: 0.04 10*3/uL (ref 0.00–0.07)
Basophils Absolute: 0 10*3/uL (ref 0.0–0.1)
Basophils Relative: 0 %
Eosinophils Absolute: 0 10*3/uL (ref 0.0–0.5)
Eosinophils Relative: 0 %
HCT: 29.7 % — ABNORMAL LOW (ref 36.0–46.0)
Hemoglobin: 9.4 g/dL — ABNORMAL LOW (ref 12.0–15.0)
Immature Granulocytes: 0 %
Lymphocytes Relative: 32 %
Lymphs Abs: 3.1 10*3/uL (ref 0.7–4.0)
MCH: 33.3 pg (ref 26.0–34.0)
MCHC: 31.6 g/dL (ref 30.0–36.0)
MCV: 105.3 fL — ABNORMAL HIGH (ref 80.0–100.0)
Monocytes Absolute: 0.8 10*3/uL (ref 0.1–1.0)
Monocytes Relative: 8 %
Neutro Abs: 5.8 10*3/uL (ref 1.7–7.7)
Neutrophils Relative %: 60 %
Platelet Count: 260 10*3/uL (ref 150–400)
RBC: 2.82 MIL/uL — ABNORMAL LOW (ref 3.87–5.11)
RDW: 19.2 % — ABNORMAL HIGH (ref 11.5–15.5)
WBC Count: 9.8 10*3/uL (ref 4.0–10.5)
nRBC: 0.2 % (ref 0.0–0.2)

## 2024-02-24 LAB — CMP (CANCER CENTER ONLY)
ALT: 18 U/L (ref 0–44)
AST: 18 U/L (ref 15–41)
Albumin: 4.1 g/dL (ref 3.5–5.0)
Alkaline Phosphatase: 52 U/L (ref 38–126)
Anion gap: 7 (ref 5–15)
BUN: 21 mg/dL (ref 8–23)
CO2: 25 mmol/L (ref 22–32)
Calcium: 9.2 mg/dL (ref 8.9–10.3)
Chloride: 106 mmol/L (ref 98–111)
Creatinine: 0.84 mg/dL (ref 0.44–1.00)
GFR, Estimated: >60 mL/min (ref >60)
Glucose, Bld: 90 mg/dL (ref 70–99)
Potassium: 3.5 mmol/L (ref 3.5–5.1)
Sodium: 138 mmol/L (ref 135–145)
Total Bilirubin: 1.5 mg/dL — ABNORMAL HIGH (ref 0.0–1.2)
Total Protein: 8.7 g/dL — ABNORMAL HIGH (ref 6.5–8.1)

## 2024-02-24 LAB — VITAMIN B12: Vitamin B-12: 364 pg/mL (ref 180–914)

## 2024-02-24 LAB — LACTATE DEHYDROGENASE: LDH: 309 U/L — ABNORMAL HIGH (ref 98–192)

## 2024-02-24 LAB — SAVE SMEAR(SSMR), FOR PROVIDER SLIDE REVIEW

## 2024-02-24 LAB — RETICULOCYTES
Immature Retic Fract: 25.2 % — ABNORMAL HIGH (ref 2.3–15.9)
RBC.: 2.79 MIL/uL — ABNORMAL LOW (ref 3.87–5.11)
Retic Count, Absolute: 415.4 10*3/uL — ABNORMAL HIGH (ref 19.0–186.0)
Retic Ct Pct: 14.9 % — ABNORMAL HIGH (ref 0.4–3.1)

## 2024-02-24 NOTE — Progress Notes (Signed)
 Hematology and Oncology Follow Up Visit  ANJELIKA AUSBURN [id number] Jun 30, 1946 78 y.o. 02/24/2024   Principle Diagnosis:  Warm autoimmune hemolytic anemia-relapse  Current Therapy:   Prednisone  80 mg p.o. daily\ IVIG the 80 g IV daily x 2 days Folic acid  2 mg p.o. daily Rituxan  375 mg/m IV weekly x 4 weeks     Interim History:  Ms. Gronewold is back for a long way to visit.  I saw her in the hospital when she came in with a hemoglobin of 5.3.  She was hemolyzing briskly.  She had a very high LDH.  Her reticulocyte count was quite high.  She was positive for the warm autoantibody.  We went ahead and treat her with prednisone .  We also gave her 2 days of IVIG.  We put her on folic acid .  She responded after about 3 days.  I felt that we could probably have her go home and treat her as an outpatient.  When she had her initial event 3 or 4 years ago, we went ahead and treated her with Rituxan .  I think we should go ahead and do that this time also.  Another possibility that we will have to think about is have her spleen taken out.  Again, I would use this only as a last resort given her age and complications from splenectomy.  She is feeling better.  Unfortunately, her husband has dementia.  She is trying to help him out as much as possible. \ Overall, I would have said that her performance status is probably ECOG 1.   Medications:  Current Outpatient Medications:    acetaminophen  (TYLENOL ) 500 MG tablet, Take 1,000 mg by mouth every 4 (four) hours as needed for headache., Disp: , Rfl:    Calcium  Carbonate+Vitamin D  600-200 MG-UNIT TABS, Take 1 tablet by mouth 2 (two) times daily., Disp: , Rfl:    Carboxymethylcellulose Sod PF (EQ RESTORE PLUS LUBRICANT EYE) 0.5 % SOLN, Place 1 drop into both eyes daily as needed (Dry eye)., Disp: , Rfl:    diphenhydrAMINE  (BENADRYL ) 25 MG tablet, Take 25-50 mg by mouth every 6 (six) hours as needed for allergies., Disp: , Rfl:    estradiol (ESTRACE) 0.1  MG/GM vaginal cream, Place 1 Applicatorful vaginally at bedtime., Disp: , Rfl:    folic acid  (FOLVITE ) 1 MG tablet, Take 2 tablets (2 mg total) by mouth daily. (Patient taking differently: Take 1 mg by mouth 2 (two) times daily.), Disp: , Rfl:    gabapentin  (NEURONTIN ) 300 MG capsule, Take 1-2 capsules (300-600 mg total) by mouth 4 (four) times daily as needed (max. 2400mg  daily)., Disp: 720 capsule, Rfl: 2   HYDROcodone -acetaminophen  (NORCO/VICODIN) 5-325 MG tablet, Take 1 tablet by mouth 2 (two) times daily., Disp: , Rfl:    hydrocortisone 2.5 % cream, Apply 1 Application topically daily as needed (Irritation). On face, Disp: , Rfl:    levothyroxine  (SYNTHROID ) 112 MCG tablet, Take 112 mcg by mouth daily before breakfast., Disp: , Rfl:    LORazepam  (ATIVAN ) 2 MG tablet, Take 2 mg by mouth 3 (three) times daily as needed for anxiety., Disp: , Rfl:    MELATONIN PO, Take 5 mg by mouth at bedtime., Disp: , Rfl:    methocarbamol  (ROBAXIN ) 750 MG tablet, Take 750 mg by mouth 2 (two) times daily as needed for muscle spasms., Disp: , Rfl:    Multiple Vitamins-Minerals (MULTIVITAMIN WITH MINERALS) tablet, Take 1 tablet by mouth daily. Complete, Disp: , Rfl:  polycarbophil (FIBERCON) 625 MG tablet, Take 625 mg by mouth 2 (two) times daily., Disp: , Rfl:    sertraline  (ZOLOFT ) 100 MG tablet, Take 100 mg by mouth daily., Disp: , Rfl:    [Paused] clopidogrel  (PLAVIX ) 75 MG tablet, Take 75 mg by mouth daily. (Patient not taking: Reported on 02/24/2024), Disp: , Rfl:    famciclovir  (FAMVIR ) 500 MG tablet, Take 0.5 tablets (250 mg total) by mouth daily. (Patient not taking: Reported on 02/24/2024), Disp: 15 tablet, Rfl: 0   famotidine  (PEPCID ) 20 MG tablet, Take 1 tablet (20 mg total) by mouth 2 (two) times daily. (Patient not taking: Reported on 02/24/2024), Disp: 60 tablet, Rfl: 0   predniSONE  (DELTASONE ) 20 MG tablet, Take 4 tablets (80 mg total) by mouth daily with breakfast for 14 days. (Patient not taking:  Reported on 02/24/2024), Disp: 56 tablet, Rfl: 0  Allergies:  Allergies  Allergen Reactions   Oxycodone Other (See Comments)    Hallucinations    Aspirin  Other (See Comments)    Interferes with headaches  Other Reaction(s): stomach issues   Imipenem Other (See Comments)    Warm and tingly all over   Methocarbamol  Rash   Statins Other (See Comments)    leg pain, muscle cramps   Zosyn [Piperacillin-Tazobactam In Dex]    Cephalexin Other (See Comments)    Stomach cramps   Clindamycin /Lincomycin Nausea And Vomiting and Rash   Contrast Media [Iodinated Contrast Media] Rash   Losartan Other (See Comments)    Pt unsure   Losartan Potassium-Hctz Other (See Comments)    Unknown Reaction - pt is unaware of this  Other Reaction(s): do not remember   Methylprednisolone  Rash    Rash on face   Penicillins Rash        Quinolones Rash    Past Medical History, Surgical history, Social history, and Family History were reviewed and updated.  Review of Systems: Review of Systems  Constitutional: Negative.   HENT:  Negative.    Eyes: Negative.   Respiratory: Negative.    Cardiovascular: Negative.   Gastrointestinal: Negative.   Endocrine: Negative.   Genitourinary: Negative.    Musculoskeletal: Negative.   Skin: Negative.   Neurological: Negative.   Hematological: Negative.   Psychiatric/Behavioral: Negative.      Physical Exam:  height is 5\' 5"  (1.651 m) and weight is 161 lb 0.6 oz (73 kg). Her oral temperature is 98.7 F (37.1 C). Her blood pressure is 132/71 and her pulse is 55 (abnormal). Her respiration is 20 and oxygen saturation is 100%.   Wt Readings from Last 3 Encounters:  02/24/24 161 lb 0.6 oz (73 kg)  02/20/24 164 lb 10.9 oz (74.7 kg)  07/11/23 168 lb (76.2 kg)    Physical Exam Vitals reviewed.  HENT:     Head: Normocephalic and atraumatic.  Eyes:     Pupils: Pupils are equal, round, and reactive to light.  Cardiovascular:     Rate and Rhythm: Normal rate  and regular rhythm.     Heart sounds: Normal heart sounds.  Pulmonary:     Effort: Pulmonary effort is normal.     Breath sounds: Normal breath sounds.  Abdominal:     General: Bowel sounds are normal.     Palpations: Abdomen is soft.  Musculoskeletal:        General: No tenderness or deformity. Normal range of motion.     Cervical back: Normal range of motion.  Lymphadenopathy:     Cervical: No cervical adenopathy.  Skin:    General: Skin is warm and dry.     Findings: No erythema or rash.  Neurological:     Mental Status: She is alert and oriented to person, place, and time.  Psychiatric:        Behavior: Behavior normal.        Thought Content: Thought content normal.        Judgment: Judgment normal.      Lab Results  Component Value Date   WBC 9.8 02/24/2024   HGB 9.4 (L) 02/24/2024   HCT 29.7 (L) 02/24/2024   MCV 105.3 (H) 02/24/2024   PLT 260 02/24/2024     Chemistry      Component Value Date/Time   NA 138 02/24/2024 1139   NA 139 02/24/2022 1001   K 3.5 02/24/2024 1139   CL 106 02/24/2024 1139   CO2 25 02/24/2024 1139   BUN 21 02/24/2024 1139   BUN 7 (L) 02/24/2022 1001   CREATININE 0.84 02/24/2024 1139      Component Value Date/Time   CALCIUM  9.2 02/24/2024 1139   ALKPHOS 52 02/24/2024 1139   AST 18 02/24/2024 1139   ALT 18 02/24/2024 1139   BILITOT 1.5 (H) 02/24/2024 1139       Impression and Plan: Ms. Gentz is a very charming 78 year old white female with relapse of her warm auto immune hemolytic anemia.  Again, I am not sure as to what triggered this.  I think she is responding which is nice to see.  Her hemoglobin is trending upward.  Hopefully, we will find that her hemoglobin will continue to improve.  I am just happy that her hemoglobin has gotten better.  I am happy that she is able to be at home and try to help her poor husband out.  Will go ahead and get Rituxan  started next week.  We will give this weekly.  I will plan to see her  back on 03/05/2024.  Hopefully, we will be able to gradually decrease her prednisone  dose.   Ivor Mars, MD 6/6/202512:32 PM

## 2024-02-27 ENCOUNTER — Inpatient Hospital Stay

## 2024-02-27 ENCOUNTER — Telehealth: Payer: Self-pay

## 2024-02-27 VITALS — BP 113/54 | HR 57 | Temp 98.1°F | Resp 17

## 2024-02-27 DIAGNOSIS — Z5112 Encounter for antineoplastic immunotherapy: Secondary | ICD-10-CM | POA: Diagnosis not present

## 2024-02-27 DIAGNOSIS — D599 Acquired hemolytic anemia, unspecified: Secondary | ICD-10-CM

## 2024-02-27 LAB — RETICULOCYTES
Immature Retic Fract: 13.8 % (ref 2.3–15.9)
RBC.: 2.85 MIL/uL — ABNORMAL LOW (ref 3.87–5.11)
Retic Count, Absolute: 292 10*3/uL — ABNORMAL HIGH (ref 19.0–186.0)
Retic Ct Pct: 10.2 % — ABNORMAL HIGH (ref 0.4–3.1)

## 2024-02-27 LAB — CBC WITH DIFFERENTIAL (CANCER CENTER ONLY)
Abs Immature Granulocytes: 0.02 10*3/uL (ref 0.00–0.07)
Basophils Absolute: 0 10*3/uL (ref 0.0–0.1)
Basophils Relative: 0 %
Eosinophils Absolute: 0 10*3/uL (ref 0.0–0.5)
Eosinophils Relative: 0 %
HCT: 30.1 % — ABNORMAL LOW (ref 36.0–46.0)
Hemoglobin: 9.6 g/dL — ABNORMAL LOW (ref 12.0–15.0)
Immature Granulocytes: 0 %
Lymphocytes Relative: 9 %
Lymphs Abs: 0.5 10*3/uL — ABNORMAL LOW (ref 0.7–4.0)
MCH: 33.7 pg (ref 26.0–34.0)
MCHC: 31.9 g/dL (ref 30.0–36.0)
MCV: 105.6 fL — ABNORMAL HIGH (ref 80.0–100.0)
Monocytes Absolute: 0.1 10*3/uL (ref 0.1–1.0)
Monocytes Relative: 2 %
Neutro Abs: 4.7 10*3/uL (ref 1.7–7.7)
Neutrophils Relative %: 89 %
Platelet Count: 231 10*3/uL (ref 150–400)
RBC: 2.85 MIL/uL — ABNORMAL LOW (ref 3.87–5.11)
RDW: 16.1 % — ABNORMAL HIGH (ref 11.5–15.5)
WBC Count: 5.3 10*3/uL (ref 4.0–10.5)
nRBC: 0 % (ref 0.0–0.2)

## 2024-02-27 LAB — CMP (CANCER CENTER ONLY)
ALT: 14 U/L (ref 0–44)
AST: 16 U/L (ref 15–41)
Albumin: 4 g/dL (ref 3.5–5.0)
Alkaline Phosphatase: 46 U/L (ref 38–126)
Anion gap: 6 (ref 5–15)
BUN: 14 mg/dL (ref 8–23)
CO2: 25 mmol/L (ref 22–32)
Calcium: 9.4 mg/dL (ref 8.9–10.3)
Chloride: 105 mmol/L (ref 98–111)
Creatinine: 0.68 mg/dL (ref 0.44–1.00)
GFR, Estimated: >60 mL/min (ref >60)
Glucose, Bld: 110 mg/dL — ABNORMAL HIGH (ref 70–99)
Potassium: 4.2 mmol/L (ref 3.5–5.1)
Sodium: 136 mmol/L (ref 135–145)
Total Bilirubin: 1.5 mg/dL — ABNORMAL HIGH (ref 0.0–1.2)
Total Protein: 7.9 g/dL (ref 6.5–8.1)

## 2024-02-27 LAB — HEPATITIS B SURFACE ANTIGEN: Hepatitis B Surface Ag: NONREACTIVE

## 2024-02-27 MED ORDER — DIPHENHYDRAMINE HCL 25 MG PO CAPS
50.0000 mg | ORAL_CAPSULE | Freq: Once | ORAL | Status: AC
  Administered 2024-02-27: 50 mg via ORAL
  Filled 2024-02-27: qty 2

## 2024-02-27 MED ORDER — SODIUM CHLORIDE 0.9 % IV SOLN: INTRAVENOUS | Status: DC

## 2024-02-27 MED ORDER — SODIUM CHLORIDE 0.9 % IV SOLN
375.0000 mg/m2 | Freq: Once | INTRAVENOUS | Status: AC
  Administered 2024-02-27: 700 mg via INTRAVENOUS
  Filled 2024-02-27: qty 50

## 2024-02-27 MED ORDER — ACETAMINOPHEN 325 MG PO TABS
650.0000 mg | ORAL_TABLET | Freq: Once | ORAL | Status: AC
  Administered 2024-02-27: 650 mg via ORAL
  Filled 2024-02-27: qty 2

## 2024-02-27 NOTE — Patient Instructions (Signed)
 [facility] - A DEPT OF [facility]  Discharge Instructions: Thank you for choosing [facility] to provide your oncology and hematology care.   If you have a lab appointment with the Cancer Center, please go directly to the Cancer Center and check in at the registration area.  Wear comfortable clothing and clothing appropriate for easy access to any Portacath or PICC line.   We strive to give you quality time with your provider. You may need to reschedule your appointment if you arrive late (15 or more minutes).  Arriving late affects you and other patients whose appointments are after yours.  Also, if you miss three or more appointments without notifying the office, you may be dismissed from the clinic at the provider's discretion.      For prescription refill requests, have your pharmacy contact our office and allow 72 hours for refills to be completed.    Today you received the following chemotherapy and/or immunotherapy agents Rituximab -XXXX      To help prevent nausea and vomiting after your treatment, we encourage you to take your nausea medication as directed.  BELOW ARE SYMPTOMS THAT SHOULD BE REPORTED IMMEDIATELY: *FEVER GREATER THAN 100.4 F (38 C) OR HIGHER *CHILLS OR SWEATING *NAUSEA AND VOMITING THAT IS NOT CONTROLLED WITH YOUR NAUSEA MEDICATION *UNUSUAL SHORTNESS OF BREATH *UNUSUAL BRUISING OR BLEEDING *URINARY PROBLEMS (pain or burning when urinating, or frequent urination) *BOWEL PROBLEMS (unusual diarrhea, constipation, pain near the anus) TENDERNESS IN MOUTH AND THROAT WITH OR WITHOUT PRESENCE OF ULCERS (sore throat, sores in mouth, or a toothache) UNUSUAL RASH, SWELLING OR PAIN  UNUSUAL VAGINAL DISCHARGE OR ITCHING   Items with * indicate a potential emergency and should be followed up as soon as possible or go to the Emergency Department if any problems should occur.  Please show the CHEMOTHERAPY ALERT CARD or  IMMUNOTHERAPY ALERT CARD at check-in to the Emergency Department and triage nurse. Should you have questions after your visit or need to cancel or reschedule your appointment, please contact [facility] - A DEPT OF [facility]  [phone] and follow the prompts.  Office hours are 8:00 a.m. to 4:30 p.m. Monday - Friday. Please note that voicemails left after 4:00 p.m. may not be returned until the following business day.  We are closed weekends and major holidays. You have access to a nurse at all times for urgent questions. Please call the main number to the clinic [phone] and follow the prompts.  For any non-urgent questions, you may also contact your provider using MyChart. We now offer e-Visits for anyone 81 and older to request care online for non-urgent symptoms. For details visit [email or web address].   Also download the MyChart app! Go to the app store, search "MyChart", open the app, select , and log in with your MyChart username and password.

## 2024-02-27 NOTE — Telephone Encounter (Signed)
 Clinical Social Work was referred by Charity fundraiser for assessment of psychosocial needs.  CSW attempted to contact patient by phone.  Left voicemail with contact information and request for return call.  RN requested CSW speak with patient about finding a Veterinary surgeon.

## 2024-02-27 NOTE — Progress Notes (Signed)
 Ok to proceed with Hep B labs pending. Patient had same labs done in 02/2021 with Hep B Ab, Total: reactive and Hep B core antigen: nonreactive.  Jobe Mulder [place], [place], BCPS, BCOP 02/27/24 9:47 AM

## 2024-02-28 ENCOUNTER — Encounter: Payer: Self-pay | Admitting: Adult Health

## 2024-02-28 ENCOUNTER — Ambulatory Visit: Payer: Medicare HMO | Admitting: Adult Health

## 2024-02-28 ENCOUNTER — Inpatient Hospital Stay

## 2024-02-28 VITALS — BP 125/64 | HR 53 | Ht 65.0 in | Wt 164.0 lb

## 2024-02-28 DIAGNOSIS — G629 Polyneuropathy, unspecified: Secondary | ICD-10-CM

## 2024-02-28 DIAGNOSIS — M5416 Radiculopathy, lumbar region: Secondary | ICD-10-CM

## 2024-02-28 LAB — HEPATITIS B CORE ANTIBODY, TOTAL: HEP B CORE AB: POSITIVE — AB

## 2024-02-28 MED ORDER — GABAPENTIN 300 MG PO CAPS: 300.0000 mg | ORAL_CAPSULE | Freq: Four times a day (QID) | ORAL | 2 refills | Status: AC | PRN

## 2024-02-28 NOTE — Patient Instructions (Signed)
Your Plan:  Continue gabapentin  If your symptoms worsen or you develop new symptoms please let us know.    Thank you for coming to see us at [facility]. I hope we have been able to provide you high quality care today.  You may receive a patient satisfaction survey over the next few weeks. We would appreciate your feedback and comments so that we may continue to improve ourselves and the health of our patients.  

## 2024-02-28 NOTE — Progress Notes (Signed)
 [facility] CSW Counseling Note  Patient was referred by nurse. Treatment type: Individual  Presenting Concerns: Patient and/or family reports the following symptoms/concerns: depression Duration of problem: several days; Severity of problem: moderate   Orientation:oriented to person, place, time/date, situation, day of week, month of year, and year.   Affect: Tearful Risk of harm to self or others: No plan to harm self or others  Patient and/or Family's Strengths/Protective Factors: Concrete supports in place (healthy food, safe environments, etc.)Capable of independent living  Communication skills  Financial means  General fund of knowledge  Supportive family/friends      Goals Addressed: Patient will:  Reduce symptoms of: depression Increase knowledge and/or ability of: self-management skills  Increase healthy adjustment to current life circumstances   Progress towards Goals: Initial   Interventions: Interventions utilized:  Solution Focused, Strength-based, and Supportive      Assessment: Patient currently experiencing increased crying episodes due to current situation.  Patient stated her husbands dementia has become worse and their relationship has changed.  Patient is currently receiving counseling from a grief counselor and respite.  Her husband attends adult day care.  She wishes to receive counseling from someone who specializes in dementia.  She has been referred to [employer] website by her present counselor.  Patient said she would investigate the site.  Informed her CSW counseling is limited due to her non-cancer diagnosis.  She stated she understood.  CSW normalized patient's feelings..      Plan: Follow up with CSW: Patient to contact. Behavioral recommendations: Continue attending counseling sessions. Referral(s): Individual therapist       Freya Jesus Zian Delair, LCSW

## 2024-02-28 NOTE — Progress Notes (Signed)
 PATIENT: [name] DOB: 02/17/1946  REASON FOR VISIT: follow up HISTORY FROM: patient PRIMARY NEUROLOGIST: Dr. Tresia Fruit   Chief Complaint  Patient presents with   Follow-up    Pt in 9 alone Pt here for neuropathy f/u Pt states same since last visit Pt states needing refill for gabapentin   Pt was admitted to [facility] for 3 days      HISTORY OF PRESENT ILLNESS: Today 02/28/24:  [name] is a 78 y.o. female with a history of neuropathy. Returns today for follow-up.  She feels that her neuropathy is approximately the same.  She remains on gabapentin  1-2 tablets QID depending on pain. Some days are better than others. Tends to have more pain when she walks a lot. Burning and tingling that radiates up the legs. Reports that she is having pain down the right leg in the hip and the inside of the right thigh- she is now going to spine and scoliosis and they are going to do injections (end of July). Balance is off. No falls. No assistive device.  She is receiving iron infusions for anemia.   07/11/23: [name] is a 78 y.o. female with a history of neuropathy. Returns today for follow-up.  She reports that she continues to have burning and tingling in the feet.   she tries to adjust her gabapentin  based off when she has symptoms.  She never exceeds 2400 mg a day.  Currently taking 1 to 2 tablets every 2-4 hours.  At the last visit she did have nerve conduction studies and a lumbar MRI.  She was referred to neurosurgery.  She saw Dr. Nat Badger and he referred her to physical therapy.  She states that since physical therapy the pain on the right inner thigh has resolved.  She states the pain down the left leg has also improved.  Only has discomfort at night.  She states that she has a second opinion scheduled with Dr. Ellery Guthrie on November 8.  In the past she has tried transdermal therapeutics compounded cream but did not find it beneficial.     NCV/EMG:  Conclusion:  EMG needle study showed  acute/ongoing denervation and chronic neurogenic changes in the bilateral lower lumbar paraspinals as well as left leg muscles that share L5, S1 innervation consistent with lumbar radiculopathy.  MRI of the lumbar spine recommended.  Nerve conduction studies showed minimal decrease in amplitude of 2 sensory nerves suggestive of concomitant mild sensory, axonal large-fiber polyneuropathy.  MRI lumbar IMPRESSION:    MRI lumbar spine without contrast demonstrating:   - At L3-4: disc bulging and facet hypertrophy with mild-moderate spinal stenosis and mild right and moderate left stenosis.   - At L2-3: disc bulging and facet hypertrophy with mild spinal stenosis and mild right and moderate left stenosis.   - At L4-5: disc bulging and facet hypertrophy with mild spinal stenosis and severe biforaminal stenosis.  HISTORY  04/28/23: [name] is a 78 y.o. female with a history of neuropathy. Returns today for follow-up.  She comes back today for sooner visit.  States that she is having numbness tingling burning and pain in both legs.  Reports that she has severe pain in the inside thigh  of both legs.  Reports that she will get a sharp pain starting in the hip radiating down the outside of the left leg.  She states the pain stays there.  Denies any significant back pain unless she stays bent over for a significant  period of time.  She reports that she has had previousnjury on the left foot which affects the way she ambulates.  Denies any recent falls.  Continues to take gabapentin  1 to 2 tablets 4 times a day.  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   12/03/22:   [name] is a 78 y.o. female with a history of neuropathy. Returns today for follow-up.  She reports that she has spaced out her gabapentin  instead of taking 2 tablets 4 times a day she takes 1 tablet every 2 hours for total of 8 times a day.  The total amount of gabapentin  has  remained the same she is just taking it more frequently.  She reports that this is working better for her symptoms.  Reports no recent falls.  She did have surgery on her foot.  Returns today for an evaluation.     11/03/22: [name] is a 78 y.o. female with a history of neuropathy. Returns today for follow-up. Reports burning pain is in the feet. But will have sharp pain that comes up the inside of the right leg on the inner thigh and the outer thigh of the left leg. No significant changes with gait or balance. No falls. Continues on gabapentin  2 tablets four times a day. Sometimes will not take full dose if she is not having pain.    Will be having surgery on left foot in march d/t hammer toes.  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  08/10/22: [name] is a 78 y.o. female who has been followed in this office for neuropathy. Returns today for follow-up. Continues on gabapentin . Working well for neuropathy. On occasion she gets pain that radiates up the inside of both legs more so on the right. Toes are cold. Will wake up nightly with muscle cramps. Doesn't feel like she drinks a lot of water  at night. Also gets muscle cramps in the lower back that radiates up the back and into the chest. Uses robaxin  and ativan  and that relieves symptoms.  Robaxin  does not seem to help with muscle cramps during the night.  She returns today for an evaluation.    REVIEW OF SYSTEMS: Out of a complete 14 system review of symptoms, the patient complains only of the following symptoms, and all other reviewed systems are negative.  ALLERGIES: Allergies  Allergen Reactions   Oxycodone Other (See Comments)    Hallucinations    Aspirin  Other (See Comments)    Interferes with headaches  Other Reaction(s): stomach issues   Imipenem Other (See Comments)    Warm and tingly all over   Methocarbamol  Rash   Statins Other (See Comments)    leg pain, muscle cramps   Zosyn  [Piperacillin-Tazobactam In Dex]    Cephalexin Other (See Comments)    Stomach cramps   Clindamycin /Lincomycin Nausea And Vomiting and Rash   Contrast Media [Iodinated Contrast Media] Rash   Losartan Other (See Comments)    Pt unsure   Losartan Potassium-Hctz Other (See Comments)    Unknown Reaction - pt is unaware of this  Other Reaction(s): do not remember   Methylprednisolone  Rash    Rash on face   Penicillins Rash        Quinolones Rash    HOME MEDICATIONS: Outpatient Medications Prior to Visit  Medication Sig Dispense Refill   acetaminophen  (TYLENOL ) 500 MG tablet Take 1,000 mg by mouth every 4 (  four) hours as needed for headache.     Calcium  Carbonate+Vitamin D  600-200 MG-UNIT TABS Take 1 tablet by mouth 2 (two) times daily.     Carboxymethylcellulose Sod PF (EQ RESTORE PLUS LUBRICANT EYE) 0.5 % SOLN Place 1 drop into both eyes daily as needed (Dry eye).     diphenhydrAMINE  (BENADRYL ) 25 MG tablet Take 25-50 mg by mouth every 6 (six) hours as needed for allergies.     estradiol (ESTRACE) 0.1 MG/GM vaginal cream Place 1 Applicatorful vaginally at bedtime.     famciclovir  (FAMVIR ) 500 MG tablet Take 0.5 tablets (250 mg total) by mouth daily. 15 tablet 0   famotidine  (PEPCID ) 20 MG tablet Take 1 tablet (20 mg total) by mouth 2 (two) times daily. 60 tablet 0   folic acid  (FOLVITE ) 1 MG tablet Take 2 tablets (2 mg total) by mouth daily. (Patient taking differently: Take 1 mg by mouth 2 (two) times daily.)     gabapentin  (NEURONTIN ) 300 MG capsule Take 1-2 capsules (300-600 mg total) by mouth 4 (four) times daily as needed (max. 2400mg  daily). 720 capsule 2   HYDROcodone -acetaminophen  (NORCO/VICODIN) 5-325 MG tablet Take 1 tablet by mouth 2 (two) times daily.     hydrocortisone 2.5 % cream Apply 1 Application topically daily as needed (Irritation). On face     levothyroxine  (SYNTHROID ) 112 MCG tablet Take 112 mcg by mouth daily before breakfast.     LORazepam  (ATIVAN ) 2 MG tablet  Take 2 mg by mouth 3 (three) times daily as needed for anxiety.     MELATONIN PO Take 5 mg by mouth at bedtime.     methocarbamol  (ROBAXIN ) 750 MG tablet Take 750 mg by mouth 2 (two) times daily as needed for muscle spasms.     Multiple Vitamins-Minerals (MULTIVITAMIN WITH MINERALS) tablet Take 1 tablet by mouth daily. Complete     polycarbophil (FIBERCON) 625 MG tablet Take 625 mg by mouth 2 (two) times daily.     predniSONE  (DELTASONE ) 20 MG tablet Take 4 tablets (80 mg total) by mouth daily with breakfast for 14 days. 56 tablet 0   sertraline  (ZOLOFT ) 100 MG tablet Take 100 mg by mouth daily.     UNABLE TO FIND Med Name:Rexagon injections     clopidogrel  (PLAVIX ) 75 MG tablet Take 75 mg by mouth daily. (Patient not taking: Reported on 02/24/2024)     No facility-administered medications prior to visit.    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   Depression    DVT (deep venous thrombosis) (HCC)    acute right peroneal DVT & age indeterminant left popliteal DVT 4//1922, s/p Xarelto  x 6 months   Fungal infection    around lips started 12-15-2022 on fluconazole  for   GERD (gastroesophageal reflux disease)    Hypothyroidism    melanoma right upper thigh 2001   MIGRAINE HEADACHE    Neuromuscular disorder (HCC)    condition where muscle separates from bone left chest, quarter size, pain intermittent   Neuropathy    feet   PFO (patent foramen ovale)    closure 09/17/21   Sleep apnea    does not use CPAP   Stress incontinence    Stroke (HCC) 01/05/2021   Wears glasses     PAST SURGICAL HISTORY: Past Surgical History:  Procedure Laterality Date   BALLOON DILATION N/A 09/18/2020   Procedure: BALLOON DILATION;  Surgeon: Feliberto Hopping,  Cherlyn Cornet, MD;  Location: [facility];  Service: Gastroenterology;  Laterality: N/A;   bilateral bunion removal     yrs ago   BUBBLE STUDY  07/03/2021   Procedure: BUBBLE  STUDY;  Surgeon: Lenise Quince, MD;  Location: [facility];  Service: Cardiovascular;;   CYSTOSCOPY WITH INJECTION N/A 01/11/2023   Procedure: CYSTOSCOPY WITH INJECTION OF [place];  Surgeon: Roxane Copp, MD;  Location: [facility];  Service: Urology;  Laterality: N/A;  45 MINS   DRUG INDUCED ENDOSCOPY Bilateral 07/28/2022   Procedure: DRUG INDUCED ENDOSCOPY;  Surgeon: Virgina Grills, MD;  Location: [facility];  Service: ENT;  Laterality: Bilateral;   ESOPHAGOGASTRODUODENOSCOPY (EGD) WITH PROPOFOL  N/A 09/18/2020   Procedure: ESOPHAGOGASTRODUODENOSCOPY (EGD) WITH PROPOFOL ;  Surgeon: Genell Ken, MD;  Location: [facility];  Service: Gastroenterology;  Laterality: N/A;   EYE SURGERY Bilateral    Cataract L eye 1-20, R eye 3-20    FOOT ARTHRODESIS Left 11/19/2022   Procedure: ARTHRODESIS FOOT WITH BONE GRAFT;  Surgeon: Floyce Hutching, DPM;  Location: [facility] OR;  Service: Podiatry;  Laterality: Left;   FOREIGN BODY REMOVAL  09/18/2020   Procedure: FOREIGN BODY REMOVAL;  Surgeon: Genell Ken, MD;  Location: [facility];  Service: Gastroenterology;;   Berton Brock FUSION Left 11/19/2022   Procedure: HALLUX METATARSAL PHALANGEAL JOINT FUSION;  Surgeon: Floyce Hutching, DPM;  Location: [facility];  Service: Podiatry;  Laterality: Left;   HAMMER TOE SURGERY Left 11/19/2022   Procedure: HAMMER TOE CORRECTION SECOND/THIRD;  Surgeon: Floyce Hutching, DPM;  Location: [facility];  Service: Podiatry;  Laterality: Left;   METATARSAL OSTEOTOMY Left 11/19/2022   Procedure: METATARSAL OSTEOTOMY;  Surgeon: Floyce Hutching, DPM;  Location: [facility];  Service: Podiatry;  Laterality: Left;  BLOCK   MOHS SURGERY  2001   upper right thigh   PATENT FORAMEN OVALE(PFO) CLOSURE N/A 08/28/2021   Procedure: PATENT FORAMEN OVALE (PFO) CLOSURE;  Surgeon: Arnoldo Lapping, MD;  Location: [facility] INVASIVE CV LAB;  Service: Cardiovascular;  Laterality: N/A;   REVERSE SHOULDER ARTHROPLASTY Right 03/16/2019    Procedure: REVERSE SHOULDER ARTHROPLASTY;  Surgeon: Winston Hawking, MD;  Location: [facility];  Service: Orthopedics;  Laterality: Right;  interscalene block   TEE WITHOUT CARDIOVERSION N/A 07/03/2021   Procedure: TRANSESOPHAGEAL ECHOCARDIOGRAM (TEE);  Surgeon: Lenise Quince, MD;  Location: [facility] ENDOSCOPY;  Service: Cardiovascular;  Laterality: N/A;   TUBAL LIGATION     yrs ago    FAMILY HISTORY: Family History  Problem Relation Age of Onset   Heart disease Mother    Stroke Father    Arthritis Sister    Diabetes Brother    Arthritis Brother    Neuropathy Brother    Neuropathy Sister    Diabetes Brother     SOCIAL HISTORY: Social History   Socioeconomic History   Marital status: Married    Spouse name: Not on file   Number of children: Not on file   Years of education: Not on file   Highest education level: Not on file  Occupational History   Not on file  Tobacco Use   Smoking status: Former    Current packs/day: 0.00    Types: Cigarettes    Quit date: 1969    Years since quitting: 56.4   Smokeless tobacco: Never  Vaping Use   Vaping status: Never Used  Substance and Sexual Activity   Alcohol  use: No   Drug use: No   Sexual activity: Not Currently    Birth  control/protection: Post-menopausal  Other Topics Concern   Not on file  Social History Narrative   Lives at home with husband (has mild Dementia) of 55 years   Right handed    Caffeine: 1 cup tea/day      Retired    Chief Executive Officer Drivers of Corporate investment banker Strain: Not on file  Food Insecurity: No Food Insecurity (02/20/2024)   Hunger Vital Sign    Worried About Running Out of Food in the Last Year: Never true    Ran Out of Food in the Last Year: Never true  Transportation Needs: No Transportation Needs (02/20/2024)   PRAPARE - Administrator, Civil Service (Medical): No    Lack of Transportation (Non-Medical): No  Physical Activity: Not on file  Stress: Not on file  Social Connections:  Socially Integrated (02/20/2024)   Social Connection and Isolation Panel [NHANES]    Frequency of Communication with Friends and Family: More than three times a week    Frequency of Social Gatherings with Friends and Family: More than three times a week    Attends Religious Services: More than 4 times per year    Active Member of [employer] or Organizations: Yes    Attends Banker Meetings: More than 4 times per year    Marital Status: Married  Catering manager Violence: Not At Risk (02/20/2024)   Humiliation, Afraid, Rape, and Kick questionnaire    Fear of Current or Ex-Partner: No    Emotionally Abused: No    Physically Abused: No    Sexually Abused: No      PHYSICAL EXAM  Vitals:   02/28/24 0956  BP: 125/64  Pulse: (!) 53  Weight: 164 lb (74.4 kg)  Height: 5\' 5"  (1.651 m)     Body mass index is 27.29 kg/m.  Generalized: Well developed, in no acute distress   Neurological examination  Mentation: Alert oriented to time, place, history taking. Follows all commands speech and language fluent Cranial nerve II-XII: Pupils were equal round reactive to light. Extraocular movements were full, visual field were full on confrontational test. Facial sensation and strength were normal.. Head turning and shoulder shrug  were normal and symmetric. Motor: The motor testing reveals 5 over 5 strength of all 4 extremities. Good symmetric motor tone is noted throughout.   Sensory: Sensory testing is intact to soft touch on all 4 extremities. Coordination: Cerebellar testing reveals good finger-nose-finger and heel-to-shin bilaterally.  Gait and station: Gait is normal.     DIAGNOSTIC DATA (LABS, IMAGING, TESTING) - I reviewed patient records, labs, notes, testing and imaging myself where available.  Lab Results  Component Value Date   WBC 5.3 02/27/2024   HGB 9.6 (L) 02/27/2024   HCT 30.1 (L) 02/27/2024   MCV 105.6 (H) 02/27/2024   PLT 231 02/27/2024      Component Value  Date/Time   NA 136 02/27/2024 0851   NA 139 02/24/2022 1001   K 4.2 02/27/2024 0851   CL 105 02/27/2024 0851   CO2 25 02/27/2024 0851   GLUCOSE 110 (H) 02/27/2024 0851   BUN 14 02/27/2024 0851   BUN 7 (L) 02/24/2022 1001   CREATININE 0.68 02/27/2024 0851   CALCIUM  9.4 02/27/2024 0851   PROT 7.9 02/27/2024 0851   PROT 6.4 02/24/2022 1001   ALBUMIN 4.0 02/27/2024 0851   ALBUMIN 4.5 08/21/2020 1519   AST 16 02/27/2024 0851   ALT 14 02/27/2024 0851   ALKPHOS 46 02/27/2024 0851  BILITOT 1.5 (H) 02/27/2024 0851   GFRNONAA >60 02/27/2024 0851   GFRAA 111 08/21/2020 1519   Lab Results  Component Value Date   CHOL 158 01/05/2021   HDL 44 01/05/2021   LDLCALC 100 (H) 01/05/2021   TRIG 70 01/05/2021   CHOLHDL 3.6 01/05/2021   Lab Results  Component Value Date   HGBA1C 4.5 (L) 02/21/2024   Lab Results  Component Value Date   VITAMINB12 364 02/24/2024   Lab Results  Component Value Date   TSH 4.876 (H) 02/20/2024      ASSESSMENT AND PLAN 78 y.o. year old female  has a past medical history of Anxiety, Arthritis, Autoimmune hemolytic anemia (HCC) (12/2020), Depression, DVT (deep venous thrombosis) (HCC), Fungal infection, GERD (gastroesophageal reflux disease), Hypothyroidism, melanoma right upper thigh (2001), MIGRAINE HEADACHE, Neuromuscular disorder (HCC), Neuropathy, PFO (patent foramen ovale), Sleep apnea, Stress incontinence, Stroke (HCC) (01/05/2021), and Wears glasses. here with:  1.  Neuropathy 2.  Lumbar radiculopathy  -Patient will continue gabapentin  300-600 mg 4 times a day.  Not to exceed 2400 mg daily - Following with spine and scoliosis for lumbar radiculopathy -Advised if symptoms worsen or she develops new symptoms she should let us  know - Follow-up in 6 to 8 months or sooner if needed      Clem Currier, MSN, NP-C 02/28/2024, 10:01 AM [facility] [address], [address] [place], [place] [address] [phone]

## 2024-03-05 ENCOUNTER — Other Ambulatory Visit: Payer: Self-pay

## 2024-03-05 ENCOUNTER — Inpatient Hospital Stay

## 2024-03-05 ENCOUNTER — Inpatient Hospital Stay: Admitting: Hematology & Oncology

## 2024-03-05 VITALS — BP 123/55 | HR 52 | Resp 18

## 2024-03-05 VITALS — BP 130/70 | HR 67 | Temp 98.5°F | Resp 18 | Ht 65.0 in | Wt 163.0 lb

## 2024-03-05 DIAGNOSIS — D599 Acquired hemolytic anemia, unspecified: Secondary | ICD-10-CM

## 2024-03-05 DIAGNOSIS — Z5112 Encounter for antineoplastic immunotherapy: Secondary | ICD-10-CM | POA: Diagnosis not present

## 2024-03-05 LAB — RETICULOCYTES
Immature Retic Fract: 14.9 % (ref 2.3–15.9)
RBC.: 3.34 MIL/uL — ABNORMAL LOW (ref 3.87–5.11)
Retic Count, Absolute: 185.7 10*3/uL (ref 19.0–186.0)
Retic Ct Pct: 5.6 % — ABNORMAL HIGH (ref 0.4–3.1)

## 2024-03-05 LAB — CMP (CANCER CENTER ONLY)
ALT: 11 U/L (ref 0–44)
AST: 14 U/L — ABNORMAL LOW (ref 15–41)
Albumin: 4.1 g/dL (ref 3.5–5.0)
Alkaline Phosphatase: 50 U/L (ref 38–126)
Anion gap: 9 (ref 5–15)
BUN: 12 mg/dL (ref 8–23)
CO2: 23 mmol/L (ref 22–32)
Calcium: 9.5 mg/dL (ref 8.9–10.3)
Chloride: 104 mmol/L (ref 98–111)
Creatinine: 0.6 mg/dL (ref 0.44–1.00)
GFR, Estimated: >60 mL/min (ref >60)
Glucose, Bld: 129 mg/dL — ABNORMAL HIGH (ref 70–99)
Potassium: 4.2 mmol/L (ref 3.5–5.1)
Sodium: 136 mmol/L (ref 135–145)
Total Bilirubin: 0.9 mg/dL (ref 0.0–1.2)
Total Protein: 7.2 g/dL (ref 6.5–8.1)

## 2024-03-05 LAB — CBC WITH DIFFERENTIAL (CANCER CENTER ONLY)
Abs Immature Granulocytes: 0.11 10*3/uL — ABNORMAL HIGH (ref 0.00–0.07)
Basophils Absolute: 0 10*3/uL (ref 0.0–0.1)
Basophils Relative: 0 %
Eosinophils Absolute: 0 10*3/uL (ref 0.0–0.5)
Eosinophils Relative: 0 %
HCT: 35.7 % — ABNORMAL LOW (ref 36.0–46.0)
Hemoglobin: 11.5 g/dL — ABNORMAL LOW (ref 12.0–15.0)
Immature Granulocytes: 1 %
Lymphocytes Relative: 3 %
Lymphs Abs: 0.4 10*3/uL — ABNORMAL LOW (ref 0.7–4.0)
MCH: 33.8 pg (ref 26.0–34.0)
MCHC: 32.2 g/dL (ref 30.0–36.0)
MCV: 105 fL — ABNORMAL HIGH (ref 80.0–100.0)
Monocytes Absolute: 0.4 10*3/uL (ref 0.1–1.0)
Monocytes Relative: 3 %
Neutro Abs: 10.5 10*3/uL — ABNORMAL HIGH (ref 1.7–7.7)
Neutrophils Relative %: 93 %
Platelet Count: 347 10*3/uL (ref 150–400)
RBC: 3.4 MIL/uL — ABNORMAL LOW (ref 3.87–5.11)
RDW: 14.6 % (ref 11.5–15.5)
WBC Count: 11.4 10*3/uL — ABNORMAL HIGH (ref 4.0–10.5)
nRBC: 0 % (ref 0.0–0.2)

## 2024-03-05 LAB — LACTATE DEHYDROGENASE: LDH: 228 U/L — ABNORMAL HIGH (ref 98–192)

## 2024-03-05 LAB — SAVE SMEAR(SSMR), FOR PROVIDER SLIDE REVIEW

## 2024-03-05 MED ORDER — SODIUM CHLORIDE 0.9 % IV SOLN: 375.0000 mg/m2 | Freq: Once | INTRAVENOUS | Status: DC

## 2024-03-05 MED ORDER — DIPHENHYDRAMINE HCL 25 MG PO CAPS
50.0000 mg | ORAL_CAPSULE | Freq: Once | ORAL | Status: AC
  Administered 2024-03-05: 50 mg via ORAL
  Filled 2024-03-05: qty 2

## 2024-03-05 MED ORDER — ACETAMINOPHEN 325 MG PO TABS
650.0000 mg | ORAL_TABLET | Freq: Once | ORAL | Status: AC
  Administered 2024-03-05: 650 mg via ORAL
  Filled 2024-03-05: qty 2

## 2024-03-05 MED ORDER — SODIUM CHLORIDE 0.9 % IV SOLN
375.0000 mg/m2 | Freq: Once | INTRAVENOUS | Status: AC
  Administered 2024-03-05: 700 mg via INTRAVENOUS
  Filled 2024-03-05: qty 50

## 2024-03-05 MED ORDER — SODIUM CHLORIDE 0.9 % IV SOLN: INTRAVENOUS | Status: DC

## 2024-03-05 NOTE — Progress Notes (Signed)
 Hematology and Oncology Follow Up Visit  [name] [id number] 11-18-1945 78 y.o. 03/05/2024   Principle Diagnosis:  Warm autoimmune hemolytic anemia-relapse  Current Therapy:   Prednisone  80 mg p.o. daily -changed to 60 mg p.o. daily on 03/05/2024 IVIG the 80 g IV daily x 2 days Folic acid  2 mg p.o. daily Rituxan  375 mg/m IV weekly x 4 weeks     Interim History:  [name] is back for follow-up.  She is doing quite nicely.  Her hemoglobin is improving nicely.  We will now decrease her prednisone  to 60 mg p.o. daily.  Her main problems been her poor husband who has dementia.  I know she is trying to help him as much as possible.  She really feels better.  She again has responded quite nicely.  Today, her reticulocyte count is down to 5.6%.  The LDH is down to 228.Aaron Aas  She has had no bleeding.  There is been no change in bowel or bladder habits.  She has had no cough or shortness of breath.  She has had no rashes.  She has had no leg swelling.  Currently, I would have to say that her performance status is about ECOG 1.    Medications:  Current Outpatient Medications:    famciclovir  (FAMVIR ) 250 MG tablet, Take 250 mg by mouth daily., Disp: , Rfl:    acetaminophen  (TYLENOL ) 500 MG tablet, Take 1,000 mg by mouth every 4 (four) hours as needed for headache., Disp: , Rfl:    Calcium  Carbonate+Vitamin D  600-200 MG-UNIT TABS, Take 1 tablet by mouth 2 (two) times daily., Disp: , Rfl:    Carboxymethylcellulose Sod PF (EQ RESTORE PLUS LUBRICANT EYE) 0.5 % SOLN, Place 1 drop into both eyes daily as needed (Dry eye)., Disp: , Rfl:    clopidogrel  (PLAVIX ) 75 MG tablet, Take 75 mg by mouth daily. (Patient not taking: Reported on 02/24/2024), Disp: , Rfl:    diphenhydrAMINE  (BENADRYL ) 25 MG tablet, Take 25-50 mg by mouth every 6 (six) hours as needed for allergies., Disp: , Rfl:    estradiol (ESTRACE) 0.1 MG/GM vaginal cream, Place 1 Applicatorful vaginally at bedtime., Disp: , Rfl:    famotidine   (PEPCID ) 20 MG tablet, Take 1 tablet (20 mg total) by mouth 2 (two) times daily., Disp: 60 tablet, Rfl: 0   folic acid  (FOLVITE ) 1 MG tablet, Take 2 tablets (2 mg total) by mouth daily. (Patient taking differently: Take 1 mg by mouth 2 (two) times daily.), Disp: , Rfl:    gabapentin  (NEURONTIN ) 300 MG capsule, Take 1-2 capsules (300-600 mg total) by mouth 4 (four) times daily as needed (max. 2400mg  daily)., Disp: 720 capsule, Rfl: 2   HYDROcodone -acetaminophen  (NORCO/VICODIN) 5-325 MG tablet, Take 1 tablet by mouth 2 (two) times daily., Disp: , Rfl:    hydrocortisone 2.5 % cream, Apply 1 Application topically daily as needed (Irritation). On face, Disp: , Rfl:    levothyroxine  (SYNTHROID ) 112 MCG tablet, Take 112 mcg by mouth daily before breakfast., Disp: , Rfl:    LORazepam  (ATIVAN ) 2 MG tablet, Take 2 mg by mouth 3 (three) times daily as needed for anxiety., Disp: , Rfl:    MELATONIN PO, Take 5 mg by mouth at bedtime., Disp: , Rfl:    methocarbamol  (ROBAXIN ) 750 MG tablet, Take 750 mg by mouth 2 (two) times daily as needed for muscle spasms., Disp: , Rfl:    Multiple Vitamins-Minerals (MULTIVITAMIN WITH MINERALS) tablet, Take 1 tablet by mouth daily. Complete, Disp: ,  Rfl:    polycarbophil (FIBERCON) 625 MG tablet, Take 625 mg by mouth 2 (two) times daily., Disp: , Rfl:    predniSONE  (DELTASONE ) 20 MG tablet, Take 4 tablets (80 mg total) by mouth daily with breakfast for 14 days., Disp: 56 tablet, Rfl: 0   sertraline  (ZOLOFT ) 100 MG tablet, Take 100 mg by mouth daily., Disp: , Rfl:    UNABLE TO FIND, Med Name:Rexagon injections, Disp: , Rfl:   Allergies:  Allergies  Allergen Reactions   Oxycodone Other (See Comments)    Hallucinations    Aspirin  Other (See Comments)    Interferes with headaches  Other Reaction(s): stomach issues   Imipenem Other (See Comments)    Warm and tingly all over   Methocarbamol  Rash   Statins Other (See Comments)    leg pain, muscle cramps   Zosyn  [Piperacillin-Tazobactam In Dex]    Cephalexin Other (See Comments)    Stomach cramps   Clindamycin /Lincomycin Nausea And Vomiting and Rash   Contrast Media [Iodinated Contrast Media] Rash   Losartan Other (See Comments)    Pt unsure   Losartan Potassium-Hctz Other (See Comments)    Unknown Reaction - pt is unaware of this  Other Reaction(s): do not remember   Methylprednisolone  Rash    Rash on face   Penicillins Rash        Quinolones Rash    Past Medical History, Surgical history, Social history, and Family History were reviewed and updated.  Review of Systems: Review of Systems  Constitutional: Negative.   HENT:  Negative.    Eyes: Negative.   Respiratory: Negative.    Cardiovascular: Negative.   Gastrointestinal: Negative.   Endocrine: Negative.   Genitourinary: Negative.    Musculoskeletal: Negative.   Skin: Negative.   Neurological: Negative.   Hematological: Negative.   Psychiatric/Behavioral: Negative.      Physical Exam:  Vital signs show temperature of 98.5.  Pulse 67.  Blood pressure 130/70.  Weight is 163 pounds.  Wt Readings from Last 3 Encounters:  02/28/24 164 lb (74.4 kg)  02/24/24 161 lb 0.6 oz (73 kg)  02/20/24 164 lb 10.9 oz (74.7 kg)    Physical Exam Vitals reviewed.  HENT:     Head: Normocephalic and atraumatic.   Eyes:     Pupils: Pupils are equal, round, and reactive to light.    Cardiovascular:     Rate and Rhythm: Normal rate and regular rhythm.     Heart sounds: Normal heart sounds.  Pulmonary:     Effort: Pulmonary effort is normal.     Breath sounds: Normal breath sounds.  Abdominal:     General: Bowel sounds are normal.     Palpations: Abdomen is soft.   Musculoskeletal:        General: No tenderness or deformity. Normal range of motion.     Cervical back: Normal range of motion.  Lymphadenopathy:     Cervical: No cervical adenopathy.   Skin:    General: Skin is warm and dry.     Findings: No erythema or rash.    Neurological:     Mental Status: She is alert and oriented to person, place, and time.   Psychiatric:        Behavior: Behavior normal.        Thought Content: Thought content normal.        Judgment: Judgment normal.      Lab Results  Component Value Date   WBC 11.4 (H) 03/05/2024  HGB 11.5 (L) 03/05/2024   HCT 35.7 (L) 03/05/2024   MCV 105.0 (H) 03/05/2024   PLT 347 03/05/2024     Chemistry      Component Value Date/Time   NA 136 03/05/2024 0831   NA 139 02/24/2022 1001   K 4.2 03/05/2024 0831   CL 104 03/05/2024 0831   CO2 23 03/05/2024 0831   BUN 12 03/05/2024 0831   BUN 7 (L) 02/24/2022 1001   CREATININE 0.60 03/05/2024 0831      Component Value Date/Time   CALCIUM  9.5 03/05/2024 0831   ALKPHOS 50 03/05/2024 0831   AST 14 (L) 03/05/2024 0831   ALT 11 03/05/2024 0831   BILITOT 0.9 03/05/2024 0831       Impression and Plan: [name] is a very charming 78 year old white female with relapse of her warm auto immune hemolytic anemia.  Again, I am not sure as to what triggered this.  Regardless, she is responding very nicely.  We will continue her on the Rituxan  right now.  Again we are going to decrease her prednisone  down to 60 mg a day.  I am just happy that she is doing so well.  She will continue on weekly Rituxan .  I will plan to see her back myself in a couple of weeks when she will have her fourth week of Rituxan .     Ivor Mars, MD 6/16/20259:51 AM

## 2024-03-05 NOTE — Patient Instructions (Signed)
 [facility] - A DEPT OF [facility]  Discharge Instructions: Thank you for choosing [facility] to provide your oncology and hematology care.   If you have a lab appointment with the Cancer Center, please go directly to the Cancer Center and check in at the registration area.  Wear comfortable clothing and clothing appropriate for easy access to any Portacath or PICC line.   We strive to give you quality time with your provider. You may need to reschedule your appointment if you arrive late (15 or more minutes).  Arriving late affects you and other patients whose appointments are after yours.  Also, if you miss three or more appointments without notifying the office, you may be dismissed from the clinic at the provider's discretion.      For prescription refill requests, have your pharmacy contact our office and allow 72 hours for refills to be completed.    Today you received the following chemotherapy and/or immunotherapy agents Rituxan     To help prevent nausea and vomiting after your treatment, we encourage you to take your nausea medication as directed.  BELOW ARE SYMPTOMS THAT SHOULD BE REPORTED IMMEDIATELY: *FEVER GREATER THAN 100.4 F (38 C) OR HIGHER *CHILLS OR SWEATING *NAUSEA AND VOMITING THAT IS NOT CONTROLLED WITH YOUR NAUSEA MEDICATION *UNUSUAL SHORTNESS OF BREATH *UNUSUAL BRUISING OR BLEEDING *URINARY PROBLEMS (pain or burning when urinating, or frequent urination) *BOWEL PROBLEMS (unusual diarrhea, constipation, pain near the anus) TENDERNESS IN MOUTH AND THROAT WITH OR WITHOUT PRESENCE OF ULCERS (sore throat, sores in mouth, or a toothache) UNUSUAL RASH, SWELLING OR PAIN  UNUSUAL VAGINAL DISCHARGE OR ITCHING   Items with * indicate a potential emergency and should be followed up as soon as possible or go to the Emergency Department if any problems should occur.  Please show the CHEMOTHERAPY ALERT CARD or IMMUNOTHERAPY ALERT  CARD at check-in to the Emergency Department and triage nurse. Should you have questions after your visit or need to cancel or reschedule your appointment, please contact [facility] - A DEPT OF [facility]  [phone] and follow the prompts.  Office hours are 8:00 a.m. to 4:30 p.m. Monday - Friday. Please note that voicemails left after 4:00 p.m. may not be returned until the following business day.  We are closed weekends and major holidays. You have access to a nurse at all times for urgent questions. Please call the main number to the clinic [phone] and follow the prompts.  For any non-urgent questions, you may also contact your provider using MyChart. We now offer e-Visits for anyone 63 and older to request care online for non-urgent symptoms. For details visit [email or web address].   Also download the MyChart app! Go to the app store, search "MyChart", open the app, select [facility], and log in with your MyChart username and password.

## 2024-03-05 NOTE — Progress Notes (Signed)
 Rapid Infusion Rituximab  Pharmacist Evaluation  TYRENE NADER is a 78 y.o. 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 This patient may be considered for RIR.   A pharmacist has verified the patient tolerated rituximab  infusions per the [facility] standard infusion protocol without grade 3-4 infusion reactions. The treatment plan will be updated to reflect RIR if the patient qualifies per the checklist below:   Age > 73 years old Yes   Clinically significant cardiovascular disease No   Circulating lymphocyte count < 5000/uL prior to cycle two Yes  Lab Results  Component Value Date   LYMPHSABS 0.4 (L) 03/05/2024    Prior documented grade 3-4 infusion reaction to rituximab  No   Prior documented grade 1-2 infusion reaction to rituximab  (If YES, Pharmacist will confirm with Physician if patient is still a candidate for RIR) No   Previous rituximab  infusion within the past 6 months Yes   Treatment Plan updated orders to reflect RIR Yes    Nancy ZEPHYRA BERNARDI does meet the criteria for Rapid Infusion Rituximab . This patient is going to be switched to rapid infusion rituximab . Patient has received Rapid Infusion Rituximab  previously without issues in 2022.  Jobe Mulder [place], BCPS, BCOP 03/05/24 10:19 AM

## 2024-03-12 ENCOUNTER — Telehealth: Payer: Self-pay | Admitting: *Deleted

## 2024-03-12 ENCOUNTER — Other Ambulatory Visit: Payer: Self-pay

## 2024-03-12 ENCOUNTER — Emergency Department (HOSPITAL_BASED_OUTPATIENT_CLINIC_OR_DEPARTMENT_OTHER)

## 2024-03-12 ENCOUNTER — Encounter: Payer: Self-pay | Admitting: Hematology & Oncology

## 2024-03-12 ENCOUNTER — Inpatient Hospital Stay

## 2024-03-12 ENCOUNTER — Encounter (HOSPITAL_BASED_OUTPATIENT_CLINIC_OR_DEPARTMENT_OTHER): Payer: Self-pay | Admitting: Emergency Medicine

## 2024-03-12 ENCOUNTER — Emergency Department (HOSPITAL_BASED_OUTPATIENT_CLINIC_OR_DEPARTMENT_OTHER)
Admission: EM | Admit: 2024-03-12 | Discharge: 2024-03-12 | Disposition: A | Discharge status: Home / Self Care | Attending: Emergency Medicine | Admitting: Emergency Medicine

## 2024-03-12 DIAGNOSIS — Z7902 Long term (current) use of antithrombotics/antiplatelets: Secondary | ICD-10-CM | POA: Insufficient documentation

## 2024-03-12 DIAGNOSIS — Z87891 Personal history of nicotine dependence: Secondary | ICD-10-CM | POA: Diagnosis not present

## 2024-03-12 DIAGNOSIS — M542 Cervicalgia: Secondary | ICD-10-CM | POA: Diagnosis not present

## 2024-03-12 DIAGNOSIS — S0083XA Contusion of other part of head, initial encounter: Secondary | ICD-10-CM | POA: Diagnosis not present

## 2024-03-12 DIAGNOSIS — Z8673 Personal history of transient ischemic attack (TIA), and cerebral infarction without residual deficits: Secondary | ICD-10-CM | POA: Insufficient documentation

## 2024-03-12 DIAGNOSIS — D599 Acquired hemolytic anemia, unspecified: Secondary | ICD-10-CM

## 2024-03-12 DIAGNOSIS — S0990XA Unspecified injury of head, initial encounter: Secondary | ICD-10-CM | POA: Diagnosis present

## 2024-03-12 DIAGNOSIS — W1830XA Fall on same level, unspecified, initial encounter: Secondary | ICD-10-CM | POA: Diagnosis not present

## 2024-03-12 DIAGNOSIS — W19XXXA Unspecified fall, initial encounter: Secondary | ICD-10-CM

## 2024-03-12 LAB — CMP (CANCER CENTER ONLY)
ALT: 17 U/L (ref 0–44)
AST: 13 U/L — ABNORMAL LOW (ref 15–41)
Albumin: 4 g/dL (ref 3.5–5.0)
Alkaline Phosphatase: 42 U/L (ref 38–126)
Anion gap: 10 (ref 5–15)
BUN: 14 mg/dL (ref 8–23)
CO2: 24 mmol/L (ref 22–32)
Calcium: 9.2 mg/dL (ref 8.9–10.3)
Chloride: 104 mmol/L (ref 98–111)
Creatinine: 0.74 mg/dL (ref 0.44–1.00)
GFR, Estimated: >60 mL/min (ref >60)
Glucose, Bld: 135 mg/dL — ABNORMAL HIGH (ref 70–99)
Potassium: 4.1 mmol/L (ref 3.5–5.1)
Sodium: 138 mmol/L (ref 135–145)
Total Bilirubin: 1 mg/dL (ref 0.0–1.2)
Total Protein: 6.6 g/dL (ref 6.5–8.1)

## 2024-03-12 LAB — RETICULOCYTES
Immature Retic Fract: 9.6 % (ref 2.3–15.9)
RBC.: 3.51 MIL/uL — ABNORMAL LOW (ref 3.87–5.11)
Retic Count, Absolute: 136.2 10*3/uL (ref 19.0–186.0)
Retic Ct Pct: 3.9 % — ABNORMAL HIGH (ref 0.4–3.1)

## 2024-03-12 LAB — CBC WITH DIFFERENTIAL (CANCER CENTER ONLY)
Abs Immature Granulocytes: 0.05 10*3/uL (ref 0.00–0.07)
Basophils Absolute: 0 10*3/uL (ref 0.0–0.1)
Basophils Relative: 0 %
Eosinophils Absolute: 0.1 10*3/uL (ref 0.0–0.5)
Eosinophils Relative: 1 %
HCT: 36.4 % (ref 36.0–46.0)
Hemoglobin: 11.9 g/dL — ABNORMAL LOW (ref 12.0–15.0)
Immature Granulocytes: 1 %
Lymphocytes Relative: 30 %
Lymphs Abs: 2.3 10*3/uL (ref 0.7–4.0)
MCH: 34.5 pg — ABNORMAL HIGH (ref 26.0–34.0)
MCHC: 32.7 g/dL (ref 30.0–36.0)
MCV: 105.5 fL — ABNORMAL HIGH (ref 80.0–100.0)
Monocytes Absolute: 0.6 10*3/uL (ref 0.1–1.0)
Monocytes Relative: 7 %
Neutro Abs: 4.7 10*3/uL (ref 1.7–7.7)
Neutrophils Relative %: 61 %
Platelet Count: 322 10*3/uL (ref 150–400)
RBC: 3.45 MIL/uL — ABNORMAL LOW (ref 3.87–5.11)
RDW: 13.5 % (ref 11.5–15.5)
WBC Count: 7.7 10*3/uL (ref 4.0–10.5)
nRBC: 0 % (ref 0.0–0.2)

## 2024-03-12 LAB — CBG MONITORING, ED: Glucose-Capillary: 112 mg/dL — ABNORMAL HIGH (ref 70–99)

## 2024-03-12 LAB — LACTATE DEHYDROGENASE: LDH: 196 U/L — ABNORMAL HIGH (ref 98–192)

## 2024-03-12 NOTE — Telephone Encounter (Signed)
 Shawanda from lab came and got this nurse because patient was feeling dizzy, shaking due to falling out of bed at 10:00pm the night before. Patient stated, I fell out of bed and hit the water  vase and night stand. The vase broke and the night stands legs came off. My legs were weak so, I had to crawl to the bathroom to climb to get up. I didn't see any blood. I put a cold wash cloth on my head. Vitals signs were stable and charted in the office. Per Lauraine Pepper, NP, she needs to have her head scanned. Patient agreed. Chemotherapy for today will be rescheduled. Patient is in agreement.

## 2024-03-12 NOTE — ED Triage Notes (Signed)
 Patient from oncology office , per nurse  pt was scheduled for treatment today . Had a fall last night , right head injury , visible hematoma to right upper forehead . Alert and oriented x 4 , on wheelchair due to tremors per nurse .

## 2024-03-12 NOTE — Discharge Instructions (Signed)
 Your images were reassuring.  CT scan of your head and neck as well as left shoulder x-ray did not show obvious abnormality from the fall today.  Recommend use of Tylenol  for any pain and follow-up with your primary care.  Please do not hesitate to return if the worrisome signs and symptoms we discussed become apparent.

## 2024-03-12 NOTE — ED Provider Notes (Signed)
 [facility] Provider Note   CSN: [id number] Arrival date & time: 03/12/24  9073     Patient presents with: Fall (Head injury )   [name] is a 78 y.o. female.    Fall  78 year old female presents emergency department with complaints of fall.  Patient states that she woke up after falling onto the ground in the middle the night.  States that she fell that she rolled out of bed during a bad dream.  States that she hit her head on the wash table beside her bed and shattered the bowl.  States that she crawled to the bathroom was able to get herself up.  Denies any LOC, blood thinner use is on Plavix .  Denies any vision symptoms, gait abnormality, slurred speech, facial droop, weakness/sensory deficits in upper or lower extremities.  Does report some concurrent left shoulder pain as well as some neck pain.  Was seen in the oncology office today and told to come to the emergency department for further assessment/evaluation regarding head injury.  Patient states that he is having some foggy headedness.  Past medical history significant for autoimmune hemolytic anemia, melanoma, DVT on no current anticoagulation, GERD, migraine, neuromuscular disorder, OSA, idiopathic small fiber peripheral neuropathy, cerebral venous thrombosis, GERD, cryptogenic stroke, lumbar spinal stenosis  Prior to Admission medications   Medication Sig Start Date End Date Taking? Authorizing Provider  acetaminophen  (TYLENOL ) 500 MG tablet Take 1,000 mg by mouth every 4 (four) hours as needed for headache.    [provider]  Calcium  Carbonate+Vitamin D  600-200 MG-UNIT TABS Take 1 tablet by mouth 2 (two) times daily.    [provider]  Carboxymethylcellulose Sod PF (EQ RESTORE PLUS LUBRICANT EYE) 0.5 % SOLN Place 1 drop into both eyes daily as needed (Dry eye).    [provider]  clopidogrel  (PLAVIX ) 75 MG tablet Take 75 mg by mouth  daily. Patient not taking: Reported on 02/24/2024    [provider]  diphenhydrAMINE  (BENADRYL ) 25 MG tablet Take 25-50 mg by mouth every 6 (six) hours as needed for allergies.    [provider]  estradiol (ESTRACE) 0.1 MG/GM vaginal cream Place 1 Applicatorful vaginally at bedtime. 02/02/24   [provider]  famciclovir  (FAMVIR ) 250 MG tablet Take 250 mg by mouth daily. 02/23/24   [provider]  famotidine  (PEPCID ) 20 MG tablet Take 1 tablet (20 mg total) by mouth 2 (two) times daily. 02/23/24   Raenelle Coria, MD  folic acid  (FOLVITE ) 1 MG tablet Take 2 tablets (2 mg total) by mouth daily. Patient taking differently: Take 1 mg by mouth 2 (two) times daily. 02/25/21   Will Almarie MATSU, MD  gabapentin  (NEURONTIN ) 300 MG capsule Take 1-2 capsules (300-600 mg total) by mouth 4 (four) times daily as needed (max. 2400mg  daily). 02/28/24   Millikan, Megan, NP  HYDROcodone -acetaminophen  (NORCO/VICODIN) 5-325 MG tablet Take 1 tablet by mouth 2 (two) times daily. 03/30/23   [provider]  hydrocortisone 2.5 % cream Apply 1 Application topically daily as needed (Irritation). On face 11/15/22   [provider]  levothyroxine  (SYNTHROID ) 112 MCG tablet Take 112 mcg by mouth daily before breakfast.    [provider]  LORazepam  (ATIVAN ) 2 MG tablet Take 2 mg by mouth 3 (three) times daily as needed for anxiety.    [provider]  MELATONIN PO Take 5 mg by mouth at bedtime.    [provider]  methocarbamol  (  ROBAXIN ) 750 MG tablet Take 750 mg by mouth 2 (two) times daily as needed for muscle spasms. 06/10/21   [provider]  Multiple Vitamins-Minerals (MULTIVITAMIN WITH MINERALS) tablet Take 1 tablet by mouth daily. Complete    [provider]  polycarbophil (FIBERCON) 625 MG tablet Take 625 mg by mouth 2 (two) times daily.    [provider]  sertraline  (ZOLOFT ) 100 MG tablet Take 100 mg by mouth daily.     [provider]  UNABLE TO FIND Med Name:Rexagon injections    [provider]    Allergies: Oxycodone, Aspirin , Imipenem, Methocarbamol , Statins, Zosyn [piperacillin-tazobactam in dex], Cephalexin, Clindamycin /lincomycin, Contrast media [iodinated contrast media], Losartan, Losartan potassium-hctz, Methylprednisolone , Penicillins, and Quinolones    Review of Systems  All other systems reviewed and are negative.   Updated Vital Signs BP 132/79 (BP Location: Right Arm)   Pulse (!) 56   Temp 97.7 F (36.5 C) (Oral)   Resp 18   SpO2 97%   Physical Exam Vitals and nursing note reviewed.  Constitutional:      General: She is not in acute distress.    Appearance: She is well-developed.  HENT:     Head: Normocephalic.     Comments: Swelling area ecchymosis right forehead.  Eyes:     Conjunctiva/sclera: Conjunctivae normal.    Cardiovascular:     Rate and Rhythm: Normal rate and regular rhythm.     Heart sounds: No murmur heard. Pulmonary:     Effort: Pulmonary effort is normal. No respiratory distress.     Breath sounds: Normal breath sounds.  Abdominal:     Palpations: Abdomen is soft.     Tenderness: There is no abdominal tenderness.   Musculoskeletal:        General: No swelling.     Cervical back: Neck supple.     Comments:  Tenderness cervical spine with left paraspinal tenderness in cervical region.  No midline tenderness thoracic or lumbar spine.  No chest wall tenderness.  Patient tender to palpation left trapezius ridge.  Otherwise, no appreciable tenderness bilateral upper or lower extremities.   Skin:    General: Skin is warm and dry.     Capillary Refill: Capillary refill takes less than 2 seconds.   Neurological:     Mental Status: She is alert.     Comments: Alert and oriented to self, place, time and event.   Speech is fluent, clear without dysarthria or dysphasia.   Strength symmetric in upper/lower extremities   Sensation intact  in upper/lower extremities   Ambulates independently. CN I not tested  CN II not tested CN III, IV, VI PERRLA and EOMs intact bilaterally  CN V Intact sensation to sharp and light touch to the face  CN VII facial movements symmetric  CN VIII not tested  CN IX, X no uvula deviation, symmetric rise of soft palate  CN XI symmetric SCM and trapezius strength bilaterally  CN XII Midline tongue protrusion, symmetric L/R movements     Psychiatric:        Mood and Affect: Mood normal.    (all labs ordered are listed, but only abnormal results are displayed) Labs Reviewed  URINALYSIS, ROUTINE W REFLEX MICROSCOPIC  CBG MONITORING, ED    EKG: None  Radiology: CT HEAD WO CONTRAST Result Date: 03/12/2024 CLINICAL DATA:  Head trauma, minor (Age >= 65y) EXAM: CT HEAD WITHOUT CONTRAST TECHNIQUE: Contiguous axial images were obtained from the base of the skull through the vertex  without intravenous contrast. RADIATION DOSE REDUCTION: This exam was performed according to the departmental dose-optimization program which includes automated exposure control, adjustment of the mA and/or kV according to patient size and/or use of iterative reconstruction technique. COMPARISON:  CT of the head dated February 20, 2024. FINDINGS: Brain: Chronic encephalomalacia changes are again demonstrated medially within the left frontal lobe, within the right parietal lobe and the left occipital lobe. There is generalized cerebral and cerebellar volume loss present. There is no evidence of acute intracranial injury. No evidence of hemorrhage, mass or hydrocephalus. Vascular: Mild calcific atheromatous disease within the carotid siphons and vertebral arteries. Skull: Intact and unremarkable. Sinuses/Orbits: Status post bilateral lens replacement. Visualized paranasal sinuses are clear. Other: None. IMPRESSION: 1. Stable chronic encephalomalacia changes within the cerebral hemispheres bilaterally. No apparent acute process.  Electronically Signed   By: Evalene Coho M.D.   On: 03/12/2024 10:10     Procedures   Medications Ordered in the ED - No data to display                                  Medical Decision Making Amount and/or Complexity of Data Reviewed Labs: ordered. Radiology: ordered.   This patient presents to the ED for concern of fall, this involves an extensive number of treatment options, and is a complaint that carries with it a high risk of complications and morbidity.  The differential diagnosis includes fracture, strain/sprain, dislocation, ligamentous/tendinous injury, neurovascular compromise, solid organ damage, pneumothorax, CVA, other   Co morbidities that complicate the patient evaluation  See HPI   Additional history obtained:  Additional history obtained from EMR External records from outside source obtained and reviewed including hospital records   Lab Tests:  I Ordered, and personally interpreted labs.  The pertinent results include: CBG greater than 100.   Imaging Studies ordered:  I ordered imaging studies including CT head/cervical spine, right shoulder x-ray I independently visualized and interpreted imaging which showed  CT head: Stable chronic encephalomalacia changes within the cerebral hemispheres bilaterally.  No apparent acute process. CT cervical spine: No acute fracture or traumatic listhesis of cervical spine. Right shoulder x-ray: No acute osseous abnormality.  Degenerative changes. I agree with the radiologist interpretation  Cardiac Monitoring: / EKG:  The patient was maintained on a cardiac monitor.  I personally viewed and interpreted the cardiac monitored which showed an underlying rhythm of: Sinus rhythm.  Abnormal R wave progression.  LVH   Consultations Obtained:  N/a   Problem List / ED Course / Critical interventions / Medication management  Fall Reevaluation of the patient showed that the patient stayed the same I have  reviewed the patients home medicines and have made adjustments as needed   Social Determinants of Health:  Former cigarette use.  Denies illicit drug use.   Test / Admission - Considered:  Fall Vitals signs within normal range and stable throughout visit. Laboratory/imaging studies significant for: See above 78 year old female presents emergency department with complaints of fall.  Patient states that she woke up after falling onto the ground in the middle the night.  States that she fell that she rolled out of bed during a bad dream.  States that she hit her head on the wash table beside her bed and shattered the bowl.  States that she crawled to the bathroom was able to get herself up.  Denies any LOC, blood thinner use is on Plavix .  Denies any vision symptoms, gait abnormality, slurred speech, facial droop, weakness/sensory deficits in upper or lower extremities.  Does report some concurrent left shoulder pain as well as some neck pain.  Was seen in the oncology office today and told to come to the emergency department for further assessment/evaluation regarding head injury.  Patient states that he is having some foggy headedness. On exam, trauma to forehead with swelling, area of ecchymosis present.  Nonfocal neurologic exam from baseline.  Reproducible tenderness midline cervical spine, left paraspinal cervical region as well as left shoulder as above.  Workup today reassuring.  CT imaging as well as left shoulder x-ray negative for any acute traumatic injury.  Patient reassured by findings.  Patient had labs performed this morning by oncology which were reassuring.  Will recommend follow-up with primary care in the outpatient setting for reassessment of symptoms.  Treatment plan discussed with patient and she knowledges standing was agreeable to said plan.  Patient well-appearing, afebrile in no acute distress. Worrisome signs and symptoms were discussed with the patient, and the patient  acknowledged understanding to return to the ED if noticed. Patient was stable upon discharge.       Final diagnoses:  None    ED Discharge Orders     None          Silver Wonda LABOR, [place] 03/12/24 1320    Tegeler, Lonni PARAS, MD 03/12/24 1436

## 2024-03-19 ENCOUNTER — Inpatient Hospital Stay

## 2024-03-19 ENCOUNTER — Inpatient Hospital Stay: Admitting: Hematology & Oncology

## 2024-03-19 ENCOUNTER — Encounter: Payer: Self-pay | Admitting: Hematology & Oncology

## 2024-03-19 ENCOUNTER — Other Ambulatory Visit: Payer: Self-pay

## 2024-03-19 VITALS — BP 137/49 | HR 52 | Temp 99.1°F | Resp 17 | Ht 65.0 in | Wt 163.0 lb

## 2024-03-19 VITALS — BP 137/61 | HR 57 | Temp 97.4°F | Resp 18

## 2024-03-19 DIAGNOSIS — D599 Acquired hemolytic anemia, unspecified: Secondary | ICD-10-CM

## 2024-03-19 DIAGNOSIS — Z5112 Encounter for antineoplastic immunotherapy: Secondary | ICD-10-CM | POA: Diagnosis not present

## 2024-03-19 LAB — CBC WITH DIFFERENTIAL (CANCER CENTER ONLY)
Abs Immature Granulocytes: 0.02 10*3/uL (ref 0.00–0.07)
Basophils Absolute: 0 10*3/uL (ref 0.0–0.1)
Basophils Relative: 0 %
Eosinophils Absolute: 0.1 10*3/uL (ref 0.0–0.5)
Eosinophils Relative: 2 %
HCT: 36.7 % (ref 36.0–46.0)
Hemoglobin: 11.8 g/dL — ABNORMAL LOW (ref 12.0–15.0)
Immature Granulocytes: 0 %
Lymphocytes Relative: 17 %
Lymphs Abs: 0.9 10*3/uL (ref 0.7–4.0)
MCH: 34.1 pg — ABNORMAL HIGH (ref 26.0–34.0)
MCHC: 32.2 g/dL (ref 30.0–36.0)
MCV: 106.1 fL — ABNORMAL HIGH (ref 80.0–100.0)
Monocytes Absolute: 0.4 10*3/uL (ref 0.1–1.0)
Monocytes Relative: 8 %
Neutro Abs: 3.7 10*3/uL (ref 1.7–7.7)
Neutrophils Relative %: 73 %
Platelet Count: 295 10*3/uL (ref 150–400)
RBC: 3.46 MIL/uL — ABNORMAL LOW (ref 3.87–5.11)
RDW: 12.8 % (ref 11.5–15.5)
WBC Count: 5.1 10*3/uL (ref 4.0–10.5)
nRBC: 0 % (ref 0.0–0.2)

## 2024-03-19 LAB — CMP (CANCER CENTER ONLY)
ALT: 16 U/L (ref 0–44)
AST: 17 U/L (ref 15–41)
Albumin: 4.2 g/dL (ref 3.5–5.0)
Alkaline Phosphatase: 47 U/L (ref 38–126)
Anion gap: 8 (ref 5–15)
BUN: 9 mg/dL (ref 8–23)
CO2: 24 mmol/L (ref 22–32)
Calcium: 9.1 mg/dL (ref 8.9–10.3)
Chloride: 109 mmol/L (ref 98–111)
Creatinine: 0.67 mg/dL (ref 0.44–1.00)
GFR, Estimated: >60 mL/min (ref >60)
Glucose, Bld: 87 mg/dL (ref 70–99)
Potassium: 4.7 mmol/L (ref 3.5–5.1)
Sodium: 141 mmol/L (ref 135–145)
Total Bilirubin: 0.7 mg/dL (ref 0.0–1.2)
Total Protein: 7 g/dL (ref 6.5–8.1)

## 2024-03-19 LAB — RETICULOCYTES
Immature Retic Fract: 15.5 % (ref 2.3–15.9)
RBC.: 3.44 MIL/uL — ABNORMAL LOW (ref 3.87–5.11)
Retic Count, Absolute: 109 10*3/uL (ref 19.0–186.0)
Retic Ct Pct: 3.2 % — ABNORMAL HIGH (ref 0.4–3.1)

## 2024-03-19 LAB — LACTATE DEHYDROGENASE: LDH: 246 U/L — ABNORMAL HIGH (ref 98–192)

## 2024-03-19 LAB — SAVE SMEAR(SSMR), FOR PROVIDER SLIDE REVIEW

## 2024-03-19 MED ORDER — PREDNISONE 20 MG PO TABS: 40.0000 mg | ORAL_TABLET | Freq: Every day | ORAL | 2 refills | Status: DC

## 2024-03-19 MED ORDER — DIPHENHYDRAMINE HCL 25 MG PO CAPS
50.0000 mg | ORAL_CAPSULE | Freq: Once | ORAL | Status: AC
  Administered 2024-03-19: 50 mg via ORAL
  Filled 2024-03-19: qty 2

## 2024-03-19 MED ORDER — ACETAMINOPHEN 325 MG PO TABS
650.0000 mg | ORAL_TABLET | Freq: Once | ORAL | Status: AC
  Administered 2024-03-19: 650 mg via ORAL
  Filled 2024-03-19: qty 2

## 2024-03-19 MED ORDER — SODIUM CHLORIDE 0.9 % IV SOLN
375.0000 mg/m2 | Freq: Once | INTRAVENOUS | Status: AC
  Administered 2024-03-19: 700 mg via INTRAVENOUS
  Filled 2024-03-19: qty 70

## 2024-03-19 MED ORDER — SODIUM CHLORIDE 0.9 % IV SOLN: INTRAVENOUS | Status: DC

## 2024-03-19 MED ORDER — SODIUM CHLORIDE 0.9 % IV SOLN
375.0000 mg/m2 | Freq: Once | INTRAVENOUS | Status: AC
  Administered 2024-03-19: 700 mg via INTRAVENOUS
  Filled 2024-03-19: qty 50

## 2024-03-19 NOTE — Progress Notes (Signed)
 Hematology and Oncology Follow Up Visit  [name] [id number] November 29, 1945 78 y.o. 03/19/2024   Principle Diagnosis:  Warm autoimmune hemolytic anemia-relapse  Current Therapy:   Prednisone  80 mg p.o. daily -changed to 60 mg p.o. daily on 03/05/2024 IVIG the 80 g IV daily x 2 days Folic acid  2 mg p.o. daily Rituxan  375 mg/m IV weekly x 4 weeks     Interim History:  Ms. [name] is back for follow-up.  We did not treat her last week because she fell and hit her head.  Thankfully, there was no bleeding.  She does have a nice ecchymoses on her forehead.  She is not sure how that happened.  She all of a sudden stopped her prednisone .  She said there is no refill on the prescription.  We really need to get back on prednisone .  I wrote down the tapering schedule for her.  At least, her hemoglobin is holding steady right now.  She has had 2 cycles of Rituxan .  She will get her third cycle today.  She has had no issues with her appetite.  This has been doing quite well.  Her birthday was last Tuesday.  She had a very nice birthday party over the weekend.  Her poor husband is still suffering from dementia.  She is doing her best to try to help him.  Overall, I would have to say that her performance status is probably ECOG 1.     Medications:  Current Outpatient Medications:    acetaminophen  (TYLENOL ) 500 MG tablet, Take 1,000 mg by mouth every 4 (four) hours as needed for headache., Disp: , Rfl:    Calcium  Carbonate+Vitamin D  600-200 MG-UNIT TABS, Take 1 tablet by mouth 2 (two) times daily., Disp: , Rfl:    Carboxymethylcellulose Sod PF (EQ RESTORE PLUS LUBRICANT EYE) 0.5 % SOLN, Place 1 drop into both eyes daily as needed (Dry eye)., Disp: , Rfl:    clopidogrel  (PLAVIX ) 75 MG tablet, Take 75 mg by mouth daily. (Patient not taking: Reported on 02/24/2024), Disp: , Rfl:    diphenhydrAMINE  (BENADRYL ) 25 MG tablet, Take 25-50 mg by mouth every 6 (six) hours as needed for allergies., Disp: ,  Rfl:    estradiol (ESTRACE) 0.1 MG/GM vaginal cream, Place 1 Applicatorful vaginally at bedtime., Disp: , Rfl:    famciclovir  (FAMVIR ) 250 MG tablet, Take 250 mg by mouth daily., Disp: , Rfl:    famotidine  (PEPCID ) 20 MG tablet, Take 1 tablet (20 mg total) by mouth 2 (two) times daily., Disp: 60 tablet, Rfl: 0   folic acid  (FOLVITE ) 1 MG tablet, Take 2 tablets (2 mg total) by mouth daily. (Patient taking differently: Take 1 mg by mouth 2 (two) times daily.), Disp: , Rfl:    gabapentin  (NEURONTIN ) 300 MG capsule, Take 1-2 capsules (300-600 mg total) by mouth 4 (four) times daily as needed (max. 2400mg  daily)., Disp: 720 capsule, Rfl: 2   HYDROcodone -acetaminophen  (NORCO/VICODIN) 5-325 MG tablet, Take 1 tablet by mouth 2 (two) times daily., Disp: , Rfl:    hydrocortisone 2.5 % cream, Apply 1 Application topically daily as needed (Irritation). On face, Disp: , Rfl:    levothyroxine  (SYNTHROID ) 112 MCG tablet, Take 112 mcg by mouth daily before breakfast., Disp: , Rfl:    LORazepam  (ATIVAN ) 2 MG tablet, Take 2 mg by mouth 3 (three) times daily as needed for anxiety., Disp: , Rfl:    MELATONIN PO, Take 5 mg by mouth at bedtime., Disp: , Rfl:  methocarbamol  (ROBAXIN ) 750 MG tablet, Take 750 mg by mouth 2 (two) times daily as needed for muscle spasms., Disp: , Rfl:    Multiple Vitamins-Minerals (MULTIVITAMIN WITH MINERALS) tablet, Take 1 tablet by mouth daily. Complete, Disp: , Rfl:    polycarbophil (FIBERCON) 625 MG tablet, Take 625 mg by mouth 2 (two) times daily., Disp: , Rfl:    sertraline  (ZOLOFT ) 100 MG tablet, Take 100 mg by mouth daily., Disp: , Rfl:    UNABLE TO FIND, Med Name:Rexagon injections, Disp: , Rfl:   Allergies:  Allergies  Allergen Reactions   Oxycodone Other (See Comments)    Hallucinations    Aspirin  Other (See Comments)    Interferes with headaches  Other Reaction(s): stomach issues   Imipenem Other (See Comments)    Warm and tingly all over   Methocarbamol  Rash    Statins Other (See Comments)    leg pain, muscle cramps   Zosyn [Piperacillin-Tazobactam In Dex]    Cephalexin Other (See Comments)    Stomach cramps   Clindamycin /Lincomycin Nausea And Vomiting and Rash   Contrast Media [Iodinated Contrast Media] Rash   Losartan Other (See Comments)    Pt unsure   Losartan Potassium-Hctz Other (See Comments)    Unknown Reaction - pt is unaware of this  Other Reaction(s): do not remember   Methylprednisolone  Rash    Rash on face   Penicillins Rash        Quinolones Rash    Past Medical History, Surgical history, Social history, and Family History were reviewed and updated.  Review of Systems: Review of Systems  Constitutional: Negative.   HENT:  Negative.    Eyes: Negative.   Respiratory: Negative.    Cardiovascular: Negative.   Gastrointestinal: Negative.   Endocrine: Negative.   Genitourinary: Negative.    Musculoskeletal: Negative.   Skin: Negative.   Neurological: Negative.   Hematological: Negative.   Psychiatric/Behavioral: Negative.      Physical Exam:  Vital signs show temperature of 99.1.  Pulse 52.  Blood pressure 137/49.  Weight is 163 pounds.    Wt Readings from Last 3 Encounters:  03/19/24 163 lb (73.9 kg)  03/05/24 163 lb (73.9 kg)  02/28/24 164 lb (74.4 kg)    Physical Exam Vitals reviewed.  HENT:     Head: Normocephalic and atraumatic.   Eyes:     Pupils: Pupils are equal, round, and reactive to light.    Cardiovascular:     Rate and Rhythm: Normal rate and regular rhythm.     Heart sounds: Normal heart sounds.  Pulmonary:     Effort: Pulmonary effort is normal.     Breath sounds: Normal breath sounds.  Abdominal:     General: Bowel sounds are normal.     Palpations: Abdomen is soft.   Musculoskeletal:        General: No tenderness or deformity. Normal range of motion.     Cervical back: Normal range of motion.  Lymphadenopathy:     Cervical: No cervical adenopathy.   Skin:    General: Skin  is warm and dry.     Findings: No erythema or rash.   Neurological:     Mental Status: She is alert and oriented to person, place, and time.   Psychiatric:        Behavior: Behavior normal.        Thought Content: Thought content normal.        Judgment: Judgment normal.      Lab  Results  Component Value Date   WBC 5.1 03/19/2024   HGB 11.8 (L) 03/19/2024   HCT 36.7 03/19/2024   MCV 106.1 (H) 03/19/2024   PLT 295 03/19/2024     Chemistry      Component Value Date/Time   NA 141 03/19/2024 0832   NA 139 02/24/2022 1001   K 4.7 03/19/2024 0832   CL 109 03/19/2024 0832   CO2 24 03/19/2024 0832   BUN 9 03/19/2024 0832   BUN 7 (L) 02/24/2022 1001   CREATININE 0.67 03/19/2024 0832      Component Value Date/Time   CALCIUM  9.1 03/19/2024 0832   ALKPHOS 47 03/19/2024 0832   AST 17 03/19/2024 0832   ALT 16 03/19/2024 0832   BILITOT 0.7 03/19/2024 0832       Impression and Plan: Ms. Behanna is a very charming 78 year old white female with relapse of her warm auto immune hemolytic anemia.  Again, I am not sure as to what triggered this.  Regardless, she is responding very nicely.  We will continue her on the Rituxan .  She will get her third week today.  She will have 1 more week after this.  At least, her reticulocyte count is coming down quite nicely.  I am very happy about this.  This really tells us  how well she is doing.  She continues on her folic acid .  Again, I will call in the prednisone  for her.  We will plan to see her back in another month or so.     Maude JONELLE Crease, MD 6/30/20259:55 AM

## 2024-03-19 NOTE — Patient Instructions (Signed)
 [facility] - A DEPT OF [facility]  Discharge Instructions: Thank you for choosing [facility] to provide your oncology and hematology care.   If you have a lab appointment with the Cancer Center, please go directly to the Cancer Center and check in at the registration area.  Wear comfortable clothing and clothing appropriate for easy access to any Portacath or PICC line.   We strive to give you quality time with your provider. You may need to reschedule your appointment if you arrive late (15 or more minutes).  Arriving late affects you and other patients whose appointments are after yours.  Also, if you miss three or more appointments without notifying the office, you may be dismissed from the clinic at the provider's discretion.      For prescription refill requests, have your pharmacy contact our office and allow 72 hours for refills to be completed.    Today you received the following chemotherapy and/or immunotherapy agents Rituxan     To help prevent nausea and vomiting after your treatment, we encourage you to take your nausea medication as directed.  BELOW ARE SYMPTOMS THAT SHOULD BE REPORTED IMMEDIATELY: *FEVER GREATER THAN 100.4 F (38 C) OR HIGHER *CHILLS OR SWEATING *NAUSEA AND VOMITING THAT IS NOT CONTROLLED WITH YOUR NAUSEA MEDICATION *UNUSUAL SHORTNESS OF BREATH *UNUSUAL BRUISING OR BLEEDING *URINARY PROBLEMS (pain or burning when urinating, or frequent urination) *BOWEL PROBLEMS (unusual diarrhea, constipation, pain near the anus) TENDERNESS IN MOUTH AND THROAT WITH OR WITHOUT PRESENCE OF ULCERS (sore throat, sores in mouth, or a toothache) UNUSUAL RASH, SWELLING OR PAIN  UNUSUAL VAGINAL DISCHARGE OR ITCHING   Items with * indicate a potential emergency and should be followed up as soon as possible or go to the Emergency Department if any problems should occur.  Please show the CHEMOTHERAPY ALERT CARD or IMMUNOTHERAPY ALERT  CARD at check-in to the Emergency Department and triage nurse. Should you have questions after your visit or need to cancel or reschedule your appointment, please contact [facility] - A DEPT OF [facility]  [phone] and follow the prompts.  Office hours are 8:00 a.m. to 4:30 p.m. Monday - Friday. Please note that voicemails left after 4:00 p.m. may not be returned until the following business day.  We are closed weekends and major holidays. You have access to a nurse at all times for urgent questions. Please call the main number to the clinic [phone] and follow the prompts.  For any non-urgent questions, you may also contact your provider using MyChart. We now offer e-Visits for anyone 63 and older to request care online for non-urgent symptoms. For details visit [email or web address].   Also download the MyChart app! Go to the app store, search "MyChart", open the app, select [facility], and log in with your MyChart username and password.

## 2024-03-26 ENCOUNTER — Inpatient Hospital Stay

## 2024-03-26 ENCOUNTER — Ambulatory Visit

## 2024-03-26 ENCOUNTER — Inpatient Hospital Stay: Attending: Hematology & Oncology

## 2024-03-26 VITALS — BP 116/63 | HR 66 | Temp 97.1°F | Resp 20

## 2024-03-26 DIAGNOSIS — D5911 Warm autoimmune hemolytic anemia: Secondary | ICD-10-CM | POA: Diagnosis present

## 2024-03-26 DIAGNOSIS — Z79624 Long term (current) use of inhibitors of nucleotide synthesis: Secondary | ICD-10-CM | POA: Diagnosis not present

## 2024-03-26 DIAGNOSIS — Z5112 Encounter for antineoplastic immunotherapy: Secondary | ICD-10-CM | POA: Insufficient documentation

## 2024-03-26 DIAGNOSIS — Z79899 Other long term (current) drug therapy: Secondary | ICD-10-CM | POA: Insufficient documentation

## 2024-03-26 DIAGNOSIS — D599 Acquired hemolytic anemia, unspecified: Secondary | ICD-10-CM

## 2024-03-26 LAB — CBC WITH DIFFERENTIAL (CANCER CENTER ONLY)
Abs Immature Granulocytes: 0.17 K/uL — ABNORMAL HIGH (ref 0.00–0.07)
Basophils Absolute: 0 K/uL (ref 0.0–0.1)
Basophils Relative: 0 %
Eosinophils Absolute: 0.1 K/uL (ref 0.0–0.5)
Eosinophils Relative: 1 %
HCT: 38.6 % (ref 36.0–46.0)
Hemoglobin: 13 g/dL (ref 12.0–15.0)
Immature Granulocytes: 2 %
Lymphocytes Relative: 13 %
Lymphs Abs: 1 K/uL (ref 0.7–4.0)
MCH: 34.5 pg — ABNORMAL HIGH (ref 26.0–34.0)
MCHC: 33.7 g/dL (ref 30.0–36.0)
MCV: 102.4 fL — ABNORMAL HIGH (ref 80.0–100.0)
Monocytes Absolute: 0.5 K/uL (ref 0.1–1.0)
Monocytes Relative: 6 %
Neutro Abs: 6.4 K/uL (ref 1.7–7.7)
Neutrophils Relative %: 78 %
Platelet Count: 357 K/uL (ref 150–400)
RBC: 3.77 MIL/uL — ABNORMAL LOW (ref 3.87–5.11)
RDW: 12.6 % (ref 11.5–15.5)
WBC Count: 8.2 K/uL (ref 4.0–10.5)
nRBC: 0 % (ref 0.0–0.2)

## 2024-03-26 LAB — RETICULOCYTES
Immature Retic Fract: 18 % — ABNORMAL HIGH (ref 2.3–15.9)
RBC.: 3.83 MIL/uL — ABNORMAL LOW (ref 3.87–5.11)
Retic Count, Absolute: 139 K/uL (ref 19.0–186.0)
Retic Ct Pct: 3.6 % — ABNORMAL HIGH (ref 0.4–3.1)

## 2024-03-26 LAB — CMP (CANCER CENTER ONLY)
ALT: 11 U/L (ref 0–44)
AST: 15 U/L (ref 15–41)
Albumin: 4.2 g/dL (ref 3.5–5.0)
Alkaline Phosphatase: 46 U/L (ref 38–126)
Anion gap: 9 (ref 5–15)
BUN: 9 mg/dL (ref 8–23)
CO2: 25 mmol/L (ref 22–32)
Calcium: 9.4 mg/dL (ref 8.9–10.3)
Chloride: 103 mmol/L (ref 98–111)
Creatinine: 0.71 mg/dL (ref 0.44–1.00)
GFR, Estimated: >60 mL/min (ref >60)
Glucose, Bld: 102 mg/dL — ABNORMAL HIGH (ref 70–99)
Potassium: 4.5 mmol/L (ref 3.5–5.1)
Sodium: 137 mmol/L (ref 135–145)
Total Bilirubin: 0.7 mg/dL (ref 0.0–1.2)
Total Protein: 6.9 g/dL (ref 6.5–8.1)

## 2024-03-26 LAB — LACTATE DEHYDROGENASE: LDH: 203 U/L — ABNORMAL HIGH (ref 98–192)

## 2024-03-26 MED ORDER — ACETAMINOPHEN 325 MG PO TABS
650.0000 mg | ORAL_TABLET | Freq: Once | ORAL | Status: AC
  Administered 2024-03-26: 650 mg via ORAL
  Filled 2024-03-26: qty 2

## 2024-03-26 MED ORDER — DIPHENHYDRAMINE HCL 25 MG PO CAPS
50.0000 mg | ORAL_CAPSULE | Freq: Once | ORAL | Status: AC
  Administered 2024-03-26: 50 mg via ORAL
  Filled 2024-03-26: qty 2

## 2024-03-26 MED ORDER — SODIUM CHLORIDE 0.9 % IV SOLN
375.0000 mg/m2 | Freq: Once | INTRAVENOUS | Status: AC
  Administered 2024-03-26: 700 mg via INTRAVENOUS
  Filled 2024-03-26: qty 50

## 2024-03-26 MED ORDER — SODIUM CHLORIDE 0.9 % IV SOLN: INTRAVENOUS | Status: DC

## 2024-03-26 NOTE — Patient Instructions (Signed)
 [facility] - A DEPT OF [facility]  Discharge Instructions: Thank you for choosing [facility] to provide your oncology and hematology care.   If you have a lab appointment with the Cancer Center, please go directly to the Cancer Center and check in at the registration area.  Wear comfortable clothing and clothing appropriate for easy access to any Portacath or PICC line.   We strive to give you quality time with your provider. You may need to reschedule your appointment if you arrive late (15 or more minutes).  Arriving late affects you and other patients whose appointments are after yours.  Also, if you miss three or more appointments without notifying the office, you may be dismissed from the clinic at the provider's discretion.      For prescription refill requests, have your pharmacy contact our office and allow 72 hours for refills to be completed.    Today you received the following chemotherapy and/or immunotherapy agents Rituxan     To help prevent nausea and vomiting after your treatment, we encourage you to take your nausea medication as directed.  BELOW ARE SYMPTOMS THAT SHOULD BE REPORTED IMMEDIATELY: *FEVER GREATER THAN 100.4 F (38 C) OR HIGHER *CHILLS OR SWEATING *NAUSEA AND VOMITING THAT IS NOT CONTROLLED WITH YOUR NAUSEA MEDICATION *UNUSUAL SHORTNESS OF BREATH *UNUSUAL BRUISING OR BLEEDING *URINARY PROBLEMS (pain or burning when urinating, or frequent urination) *BOWEL PROBLEMS (unusual diarrhea, constipation, pain near the anus) TENDERNESS IN MOUTH AND THROAT WITH OR WITHOUT PRESENCE OF ULCERS (sore throat, sores in mouth, or a toothache) UNUSUAL RASH, SWELLING OR PAIN  UNUSUAL VAGINAL DISCHARGE OR ITCHING   Items with * indicate a potential emergency and should be followed up as soon as possible or go to the Emergency Department if any problems should occur.  Please show the CHEMOTHERAPY ALERT CARD or IMMUNOTHERAPY ALERT  CARD at check-in to the Emergency Department and triage nurse. Should you have questions after your visit or need to cancel or reschedule your appointment, please contact [facility] - A DEPT OF [facility]  [phone] and follow the prompts.  Office hours are 8:00 a.m. to 4:30 p.m. Monday - Friday. Please note that voicemails left after 4:00 p.m. may not be returned until the following business day.  We are closed weekends and major holidays. You have access to a nurse at all times for urgent questions. Please call the main number to the clinic [phone] and follow the prompts.  For any non-urgent questions, you may also contact your provider using MyChart. We now offer e-Visits for anyone 63 and older to request care online for non-urgent symptoms. For details visit [email or web address].   Also download the MyChart app! Go to the app store, search "MyChart", open the app, select [facility], and log in with your MyChart username and password.

## 2024-04-16 ENCOUNTER — Inpatient Hospital Stay

## 2024-04-16 ENCOUNTER — Encounter: Payer: Self-pay | Admitting: Hematology & Oncology

## 2024-04-16 ENCOUNTER — Inpatient Hospital Stay: Admitting: Hematology & Oncology

## 2024-04-16 VITALS — BP 134/83 | HR 59 | Temp 98.5°F | Resp 19 | Ht 65.0 in | Wt 164.8 lb

## 2024-04-16 DIAGNOSIS — D599 Acquired hemolytic anemia, unspecified: Secondary | ICD-10-CM | POA: Diagnosis not present

## 2024-04-16 DIAGNOSIS — Z5112 Encounter for antineoplastic immunotherapy: Secondary | ICD-10-CM | POA: Diagnosis not present

## 2024-04-16 LAB — CBC WITH DIFFERENTIAL (CANCER CENTER ONLY)
Abs Immature Granulocytes: 0.04 K/uL (ref 0.00–0.07)
Basophils Absolute: 0 K/uL (ref 0.0–0.1)
Basophils Relative: 1 %
Eosinophils Absolute: 0.2 K/uL (ref 0.0–0.5)
Eosinophils Relative: 4 %
HCT: 39.8 % (ref 36.0–46.0)
Hemoglobin: 13.3 g/dL (ref 12.0–15.0)
Immature Granulocytes: 1 %
Lymphocytes Relative: 27 %
Lymphs Abs: 1.4 K/uL (ref 0.7–4.0)
MCH: 33.8 pg (ref 26.0–34.0)
MCHC: 33.4 g/dL (ref 30.0–36.0)
MCV: 101 fL — ABNORMAL HIGH (ref 80.0–100.0)
Monocytes Absolute: 0.7 K/uL (ref 0.1–1.0)
Monocytes Relative: 13 %
Neutro Abs: 2.8 K/uL (ref 1.7–7.7)
Neutrophils Relative %: 54 %
Platelet Count: 275 K/uL (ref 150–400)
RBC: 3.94 MIL/uL (ref 3.87–5.11)
RDW: 11.9 % (ref 11.5–15.5)
WBC Count: 5.1 K/uL (ref 4.0–10.5)
nRBC: 0 % (ref 0.0–0.2)

## 2024-04-16 LAB — CMP (CANCER CENTER ONLY)
ALT: 15 U/L (ref 0–44)
AST: 20 U/L (ref 15–41)
Albumin: 4.4 g/dL (ref 3.5–5.0)
Alkaline Phosphatase: 57 U/L (ref 38–126)
Anion gap: 10 (ref 5–15)
BUN: 9 mg/dL (ref 8–23)
CO2: 25 mmol/L (ref 22–32)
Calcium: 9.9 mg/dL (ref 8.9–10.3)
Chloride: 106 mmol/L (ref 98–111)
Creatinine: 0.61 mg/dL (ref 0.44–1.00)
GFR, Estimated: >60 mL/min (ref >60)
Glucose, Bld: 86 mg/dL (ref 70–99)
Potassium: 4.8 mmol/L (ref 3.5–5.1)
Sodium: 141 mmol/L (ref 135–145)
Total Bilirubin: 0.4 mg/dL (ref 0.0–1.2)
Total Protein: 6.9 g/dL (ref 6.5–8.1)

## 2024-04-16 LAB — RETICULOCYTES
Immature Retic Fract: 9.8 % (ref 2.3–15.9)
RBC.: 3.98 MIL/uL (ref 3.87–5.11)
Retic Count, Absolute: 78.8 K/uL (ref 19.0–186.0)
Retic Ct Pct: 2 % (ref 0.4–3.1)

## 2024-04-16 LAB — LACTATE DEHYDROGENASE: LDH: 188 U/L (ref 98–192)

## 2024-04-16 NOTE — Progress Notes (Signed)
 Hematology and Oncology Follow Up Visit  [name] [id number] [birth year] 78 y.o. 04/16/2024   Principle Diagnosis:  Warm autoimmune hemolytic anemia-relapse  Current Therapy:   Prednisone  80 mg p.o. daily -changed to 60 mg p.o. daily on 03/05/2024 IVIG the 80 g IV daily x 2 days Folic acid  2 mg p.o. daily Rituxan  375 mg/m IV weekly x 4 weeks     Interim History:  [name] is back for follow-up.  Her main problem has been her poor husband.  He has had a hard time.  His dementia is real issue.  Thankfully, it sounds like he will be going to the [place] and they will pay for 2 days of daycare for him.  She is now off prednisone .  She has finished up her Rituxan .  She really has done quite nicely.  Her hemoglobin is holding steady.  She has had no problems with headache.  There is been no problems with nausea or vomiting.  I told her make sure that she takes 2 folic acid  a day.  She has had no rashes.  There is been no leg swelling.  She has had no fever.  Overall, I will see that her performance status is probably ECOG 1.   Medications:  Current Outpatient Medications:    acetaminophen  (TYLENOL ) 500 MG tablet, Take 1,000 mg by mouth every 4 (four) hours as needed for headache., Disp: , Rfl:    Calcium  Carbonate+Vitamin D  600-200 MG-UNIT TABS, Take 1 tablet by mouth 2 (two) times daily., Disp: , Rfl:    Carboxymethylcellulose Sod PF (EQ RESTORE PLUS LUBRICANT EYE) 0.5 % SOLN, Place 1 drop into both eyes daily as needed (Dry eye)., Disp: , Rfl:    clopidogrel  (PLAVIX ) 75 MG tablet, Take 75 mg by mouth daily., Disp: , Rfl:    diphenhydrAMINE  (BENADRYL ) 25 MG tablet, Take 25-50 mg by mouth every 6 (six) hours as needed for allergies., Disp: , Rfl:    estradiol (ESTRACE) 0.1 MG/GM vaginal cream, Place 1 Applicatorful vaginally at bedtime., Disp: , Rfl:    folic acid  (FOLVITE ) 1 MG tablet, Take 2 tablets (2 mg total) by mouth daily., Disp: , Rfl:    gabapentin  (NEURONTIN ) 300 MG  capsule, Take 1-2 capsules (300-600 mg total) by mouth 4 (four) times daily as needed (max. 2400mg  daily)., Disp: 720 capsule, Rfl: 2   HYDROcodone -acetaminophen  (NORCO/VICODIN) 5-325 MG tablet, Take 1 tablet by mouth 2 (two) times daily., Disp: , Rfl:    hydrocortisone 2.5 % cream, Apply 1 Application topically daily as needed (Irritation). On face, Disp: , Rfl:    levothyroxine  (SYNTHROID ) 112 MCG tablet, Take 112 mcg by mouth daily before breakfast., Disp: , Rfl:    LORazepam  (ATIVAN ) 2 MG tablet, Take 2 mg by mouth 3 (three) times daily as needed for anxiety., Disp: , Rfl:    MELATONIN PO, Take 5 mg by mouth at bedtime., Disp: , Rfl:    methocarbamol  (ROBAXIN ) 750 MG tablet, Take 750 mg by mouth 2 (two) times daily as needed for muscle spasms., Disp: , Rfl:    Multiple Vitamins-Minerals (MULTIVITAMIN WITH MINERALS) tablet, Take 1 tablet by mouth daily. Complete, Disp: , Rfl:    polycarbophil (FIBERCON) 625 MG tablet, Take 625 mg by mouth 2 (two) times daily., Disp: , Rfl:    sertraline  (ZOLOFT ) 100 MG tablet, Take 100 mg by mouth daily., Disp: , Rfl:    UNABLE TO FIND, Med Name:Rexagon injections, Disp: , Rfl:    famciclovir  (FAMVIR ) 250  MG tablet, Take 250 mg by mouth daily. (Patient not taking: Reported on 04/16/2024), Disp: , Rfl:    famotidine  (PEPCID ) 20 MG tablet, Take 1 tablet (20 mg total) by mouth 2 (two) times daily. (Patient not taking: Reported on 04/16/2024), Disp: 60 tablet, Rfl: 0  Allergies:  Allergies  Allergen Reactions   Oxycodone Other (See Comments)    Hallucinations    Aspirin  Other (See Comments)    Interferes with headaches  Other Reaction(s): stomach issues   Imipenem Other (See Comments)    Warm and tingly all over   Methocarbamol  Rash   Statins Other (See Comments)    leg pain, muscle cramps   Zosyn [Piperacillin-Tazobactam In Dex]    Cephalexin Other (See Comments)    Stomach cramps   Clindamycin /Lincomycin Nausea And Vomiting and Rash   Contrast Media  [Iodinated Contrast Media] Rash   Losartan Other (See Comments)    Pt unsure   Losartan Potassium-Hctz Other (See Comments)    Unknown Reaction - pt is unaware of this  Other Reaction(s): do not remember   Methylprednisolone  Rash    Rash on face   Penicillins Rash        Quinolones Rash    Past Medical History, Surgical history, Social history, and Family History were reviewed and updated.  Review of Systems: Review of Systems  Constitutional: Negative.   HENT:  Negative.    Eyes: Negative.   Respiratory: Negative.    Cardiovascular: Negative.   Gastrointestinal: Negative.   Endocrine: Negative.   Genitourinary: Negative.    Musculoskeletal: Negative.   Skin: Negative.   Neurological: Negative.   Hematological: Negative.   Psychiatric/Behavioral: Negative.      Physical Exam:  Vital signs show temperature of 99.1.  Pulse 52.  Blood pressure 137/49.  Weight is 163 pounds.    Wt Readings from Last 3 Encounters:  04/16/24 164 lb 12.8 oz (74.8 kg)  03/19/24 163 lb (73.9 kg)  03/05/24 163 lb (73.9 kg)    Physical Exam Vitals reviewed.  HENT:     Head: Normocephalic and atraumatic.  Eyes:     Pupils: Pupils are equal, round, and reactive to light.  Cardiovascular:     Rate and Rhythm: Normal rate and regular rhythm.     Heart sounds: Normal heart sounds.  Pulmonary:     Effort: Pulmonary effort is normal.     Breath sounds: Normal breath sounds.  Abdominal:     General: Bowel sounds are normal.     Palpations: Abdomen is soft.  Musculoskeletal:        General: No tenderness or deformity. Normal range of motion.     Cervical back: Normal range of motion.  Lymphadenopathy:     Cervical: No cervical adenopathy.  Skin:    General: Skin is warm and dry.     Findings: No erythema or rash.  Neurological:     Mental Status: She is alert and oriented to person, place, and time.  Psychiatric:        Behavior: Behavior normal.        Thought Content: Thought  content normal.        Judgment: Judgment normal.      Lab Results  Component Value Date   WBC 5.1 04/16/2024   HGB 13.3 04/16/2024   HCT 39.8 04/16/2024   MCV 101.0 (H) 04/16/2024   PLT 275 04/16/2024     Chemistry      Component Value Date/Time   NA 137  03/26/2024 0753   NA 139 02/24/2022 1001   K 4.5 03/26/2024 0753   CL 103 03/26/2024 0753   CO2 25 03/26/2024 0753   BUN 9 03/26/2024 0753   BUN 7 (L) 02/24/2022 1001   CREATININE 0.71 03/26/2024 0753      Component Value Date/Time   CALCIUM  9.4 03/26/2024 0753   ALKPHOS 46 03/26/2024 0753   AST 15 03/26/2024 0753   ALT 11 03/26/2024 0753   BILITOT 0.7 03/26/2024 0753       Impression and Plan: [name] is a very charming 78 year old white female with relapse of her warm auto immune hemolytic anemia.  Again, I am not sure as to what triggered this.  Regardless, she is responding very nicely.  Of note, her reticulocyte count is only 2% now.  We will get her off the Famvir .  We will get her off the Pepcid  at this point.  I will now plan to see her back after Labor Day holiday.  I will go ahead and plan to make sure she comes back sooner if she has any problems.  I told her that if she does know any fatigue or decrease in stamina, please let us  know.  We will continue to pray hard for her poor husband.   Maude JONELLE Crease, MD 7/28/20258:20 AM

## 2024-05-29 ENCOUNTER — Ambulatory Visit (INDEPENDENT_AMBULATORY_CARE_PROVIDER_SITE_OTHER)

## 2024-05-29 ENCOUNTER — Ambulatory Visit (INDEPENDENT_AMBULATORY_CARE_PROVIDER_SITE_OTHER): Admitting: Podiatry

## 2024-05-29 VITALS — Ht 65.0 in | Wt 164.0 lb

## 2024-05-29 DIAGNOSIS — M7751 Other enthesopathy of right foot: Secondary | ICD-10-CM

## 2024-05-29 DIAGNOSIS — M7752 Other enthesopathy of left foot: Secondary | ICD-10-CM | POA: Diagnosis not present

## 2024-05-29 DIAGNOSIS — M10072 Idiopathic gout, left ankle and foot: Secondary | ICD-10-CM | POA: Diagnosis not present

## 2024-05-29 DIAGNOSIS — T8484XA Pain due to internal orthopedic prosthetic devices, implants and grafts, initial encounter: Secondary | ICD-10-CM

## 2024-05-29 DIAGNOSIS — M25872 Other specified joint disorders, left ankle and foot: Secondary | ICD-10-CM

## 2024-05-30 ENCOUNTER — Ambulatory Visit: Payer: Self-pay | Admitting: Podiatry

## 2024-05-30 ENCOUNTER — Telehealth: Payer: Self-pay | Admitting: Lab

## 2024-05-30 LAB — URIC ACID: Uric Acid: 5.1 mg/dL (ref 3.1–7.9)

## 2024-05-30 LAB — C-REACTIVE PROTEIN: CRP: 3 mg/L (ref 0–10)

## 2024-05-30 LAB — SEDIMENTATION RATE: Sed Rate: 9 mm/h (ref 0–40)

## 2024-05-30 NOTE — Telephone Encounter (Signed)
 Patient is calling requesting CT order for her foot states had lab work and wants to know what the next step in her care plan will be.

## 2024-05-31 MED ORDER — PREDNISONE 10 MG PO TABS: ORAL_TABLET | ORAL | 0 refills | Status: AC

## 2024-05-31 NOTE — Telephone Encounter (Signed)
Addressed in separate MyChart message.

## 2024-06-01 ENCOUNTER — Inpatient Hospital Stay
Admission: RE | Admit: 2024-06-01 | Discharge: 2024-06-01 | Discharge status: Home / Self Care | Source: Ambulatory Visit | Attending: Podiatry

## 2024-06-01 ENCOUNTER — Encounter: Payer: Self-pay | Admitting: Hematology & Oncology

## 2024-06-01 DIAGNOSIS — M25872 Other specified joint disorders, left ankle and foot: Secondary | ICD-10-CM

## 2024-06-01 DIAGNOSIS — T8484XA Pain due to internal orthopedic prosthetic devices, implants and grafts, initial encounter: Secondary | ICD-10-CM

## 2024-06-01 NOTE — Progress Notes (Signed)
  Subjective:  Patient ID: [name], female    DOB: 12/15/1945,  MRN: [id number]  Chief Complaint  Patient presents with   Foot Pain    RM 2 Pt is here for bilateral ft pain. Patient has previous surgery and neuropathy. Pain present for x 2 weeks. Throbbing pain on the dorsal aspect of the feet.    78 y.o. female presents with the above complaint. History confirmed with patient. Pain is worst in the left foot.  Objective:  Physical Exam: warm, good capillary refill, no trophic changes or ulcerative lesions, normal DP and PT pulses, normal sensory exam, and pain and tenderness around the sesamoid complex on the plantar left foot and along the abductor hallucis muscle belly.   Radiographs: Multiple views x-ray of both feet: previous MTP fusion and metatarsal osteotomies on the left foot without complication no evidence of fracture or stress fracture some moderate arthritic changes in the midfoot and lesser MTP joints.  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 Acute idiopathic gout of left foot   2. Sesamoiditis of left foot   3. Pain due to internal orthopedic prosthetic device, initial encounter [facility])      Plan:  Patient was evaluated and treated and all questions answered.  Has multiple issues and possible etiologies of bilateral foot pain, we discussed the possibility of gout and labwork with yes R uric acid and CRP were ordered for in the normal range.  CT scan ordered to evaluate her fusion site, her metatarsophalangeal joints and hardware positioning.  I will follow up with her after the CT scan.  Rx for prednisone  taper was sent to pharmacy.  No follow-ups on file.

## 2024-06-04 ENCOUNTER — Inpatient Hospital Stay (HOSPITAL_BASED_OUTPATIENT_CLINIC_OR_DEPARTMENT_OTHER): Admitting: Hematology & Oncology

## 2024-06-04 ENCOUNTER — Other Ambulatory Visit: Payer: Self-pay

## 2024-06-04 ENCOUNTER — Encounter: Payer: Self-pay | Admitting: Hematology & Oncology

## 2024-06-04 ENCOUNTER — Inpatient Hospital Stay: Attending: Hematology & Oncology

## 2024-06-04 VITALS — BP 138/70 | HR 61 | Temp 98.3°F | Resp 19 | Ht 65.0 in | Wt 166.0 lb

## 2024-06-04 DIAGNOSIS — Z7952 Long term (current) use of systemic steroids: Secondary | ICD-10-CM | POA: Insufficient documentation

## 2024-06-04 DIAGNOSIS — D599 Acquired hemolytic anemia, unspecified: Secondary | ICD-10-CM

## 2024-06-04 DIAGNOSIS — Z79891 Long term (current) use of opiate analgesic: Secondary | ICD-10-CM | POA: Diagnosis not present

## 2024-06-04 DIAGNOSIS — Z79890 Hormone replacement therapy (postmenopausal): Secondary | ICD-10-CM | POA: Insufficient documentation

## 2024-06-04 DIAGNOSIS — Z7902 Long term (current) use of antithrombotics/antiplatelets: Secondary | ICD-10-CM | POA: Diagnosis not present

## 2024-06-04 DIAGNOSIS — D5911 Warm autoimmune hemolytic anemia: Secondary | ICD-10-CM | POA: Diagnosis present

## 2024-06-04 DIAGNOSIS — Z79899 Other long term (current) drug therapy: Secondary | ICD-10-CM | POA: Insufficient documentation

## 2024-06-04 LAB — CMP (CANCER CENTER ONLY)
ALT: 11 U/L (ref 0–44)
AST: 13 U/L — ABNORMAL LOW (ref 15–41)
Albumin: 4.7 g/dL (ref 3.5–5.0)
Alkaline Phosphatase: 52 U/L (ref 38–126)
Anion gap: 12 (ref 5–15)
BUN: 11 mg/dL (ref 8–23)
CO2: 25 mmol/L (ref 22–32)
Calcium: 10 mg/dL (ref 8.9–10.3)
Chloride: 105 mmol/L (ref 98–111)
Creatinine: 0.67 mg/dL (ref 0.44–1.00)
GFR, Estimated: >60 mL/min (ref >60)
Glucose, Bld: 107 mg/dL — ABNORMAL HIGH (ref 70–99)
Potassium: 4.3 mmol/L (ref 3.5–5.1)
Sodium: 142 mmol/L (ref 135–145)
Total Bilirubin: 0.5 mg/dL (ref 0.0–1.2)
Total Protein: 7.2 g/dL (ref 6.5–8.1)

## 2024-06-04 LAB — CBC WITH DIFFERENTIAL (CANCER CENTER ONLY)
Abs Immature Granulocytes: 0.14 K/uL — ABNORMAL HIGH (ref 0.00–0.07)
Basophils Absolute: 0 K/uL (ref 0.0–0.1)
Basophils Relative: 0 %
Eosinophils Absolute: 0 K/uL (ref 0.0–0.5)
Eosinophils Relative: 0 %
HCT: 43.6 % (ref 36.0–46.0)
Hemoglobin: 14.8 g/dL (ref 12.0–15.0)
Immature Granulocytes: 1 %
Lymphocytes Relative: 14 %
Lymphs Abs: 1.7 K/uL (ref 0.7–4.0)
MCH: 33 pg (ref 26.0–34.0)
MCHC: 33.9 g/dL (ref 30.0–36.0)
MCV: 97.3 fL (ref 80.0–100.0)
Monocytes Absolute: 1.3 K/uL — ABNORMAL HIGH (ref 0.1–1.0)
Monocytes Relative: 10 %
Neutro Abs: 9.7 K/uL — ABNORMAL HIGH (ref 1.7–7.7)
Neutrophils Relative %: 75 %
Platelet Count: 356 K/uL (ref 150–400)
RBC: 4.48 MIL/uL (ref 3.87–5.11)
RDW: 11.4 % — ABNORMAL LOW (ref 11.5–15.5)
WBC Count: 12.9 K/uL — ABNORMAL HIGH (ref 4.0–10.5)
nRBC: 0 % (ref 0.0–0.2)

## 2024-06-04 LAB — LACTATE DEHYDROGENASE: LDH: 153 U/L (ref 98–192)

## 2024-06-04 LAB — RETICULOCYTES
Immature Retic Fract: 12.8 % (ref 2.3–15.9)
RBC.: 4.46 MIL/uL (ref 3.87–5.11)
Retic Count, Absolute: 87 K/uL (ref 19.0–186.0)
Retic Ct Pct: 2 % (ref 0.4–3.1)

## 2024-06-04 LAB — SAVE SMEAR(SSMR), FOR PROVIDER SLIDE REVIEW

## 2024-06-04 NOTE — Progress Notes (Signed)
 Hematology and Oncology Follow Up Visit  [name] [id number] 24-Feb-1946 78 y.o. 06/04/2024   Principle Diagnosis:  Warm autoimmune hemolytic anemia-relapse  Current Therapy:   Prednisone  80 mg p.o. daily -changed to 60 mg p.o. daily on 03/05/2024 IVIG the 80 g IV daily x 2 days Folic acid  2 mg p.o. daily Rituxan  375 mg/m IV weekly x 4 weeks     Interim History:  [name] is back for follow-up.  Her main problem has been her left foot.  She comes in with a walking shoe on.  She has had problems for the past couple weeks.  She had a CT scan of the foot done yesterday.  The results are not yet back..  She is on prednisone  for this.  She and her hemolysis has abated.  Her reticulocyte count today was only 2%.  Her hemoglobin is 14.8.  She has had no problems with nausea or vomiting.  She has had no cough or shortness of breath.  She has had no rashes.  She has had no change in bowel or bladder habits.  She continues on her folic acid  which I think is critical.  Overall, I will have to set her performance status is probably ECOG 1.   Medications:  Current Outpatient Medications:    acetaminophen  (TYLENOL ) 500 MG tablet, Take 1,000 mg by mouth every 4 (four) hours as needed for headache., Disp: , Rfl:    Calcium  Carbonate+Vitamin D  600-200 MG-UNIT TABS, Take 1 tablet by mouth 2 (two) times daily., Disp: , Rfl:    Carboxymethylcellulose Sod PF (EQ RESTORE PLUS LUBRICANT EYE) 0.5 % SOLN, Place 1 drop into both eyes daily as needed (Dry eye)., Disp: , Rfl:    clopidogrel  (PLAVIX ) 75 MG tablet, Take 75 mg by mouth daily., Disp: , Rfl:    diphenhydrAMINE  (BENADRYL ) 25 MG tablet, Take 25-50 mg by mouth every 6 (six) hours as needed for allergies., Disp: , Rfl:    estradiol (ESTRACE) 0.1 MG/GM vaginal cream, Place 1 Applicatorful vaginally at bedtime., Disp: , Rfl:    folic acid  (FOLVITE ) 1 MG tablet, Take 2 tablets (2 mg total) by mouth daily., Disp: , Rfl:    gabapentin  (NEURONTIN ) 300  MG capsule, Take 1-2 capsules (300-600 mg total) by mouth 4 (four) times daily as needed (max. 2400mg  daily)., Disp: 720 capsule, Rfl: 2   HYDROcodone -acetaminophen  (NORCO/VICODIN) 5-325 MG tablet, Take 1 tablet by mouth 2 (two) times daily., Disp: , Rfl:    hydrocortisone 2.5 % cream, Apply 1 Application topically daily as needed (Irritation). On face, Disp: , Rfl:    levothyroxine  (SYNTHROID ) 112 MCG tablet, Take 112 mcg by mouth daily before breakfast., Disp: , Rfl:    LORazepam  (ATIVAN ) 2 MG tablet, Take 2 mg by mouth 3 (three) times daily as needed for anxiety., Disp: , Rfl:    MELATONIN PO, Take 5 mg by mouth at bedtime., Disp: , Rfl:    methocarbamol  (ROBAXIN ) 750 MG tablet, Take 750 mg by mouth 2 (two) times daily as needed for muscle spasms., Disp: , Rfl:    Multiple Vitamins-Minerals (MULTIVITAMIN WITH MINERALS) tablet, Take 1 tablet by mouth daily. Complete, Disp: , Rfl:    polycarbophil (FIBERCON) 625 MG tablet, Take 625 mg by mouth 2 (two) times daily., Disp: , Rfl:    predniSONE  (DELTASONE ) 10 MG tablet, Take 6 tablets (60 mg total) by mouth daily with breakfast for 1 day, THEN 5 tablets (50 mg total) daily with breakfast for 1 day,  THEN 4 tablets (40 mg total) daily with breakfast for 1 day, THEN 3 tablets (30 mg total) daily with breakfast for 1 day, THEN 2 tablets (20 mg total) daily with breakfast for 1 day, THEN 1 tablet (10 mg total) daily with breakfast for 1 day, THEN 1 tablet (10 mg total) daily with breakfast for 1 day., Disp: 22 tablet, Rfl: 0   sertraline  (ZOLOFT ) 100 MG tablet, Take 100 mg by mouth daily., Disp: , Rfl:    UNABLE TO FIND, Med Name:Rexagon injections, Disp: , Rfl:   Allergies:  Allergies  Allergen Reactions   Oxycodone Other (See Comments)    Hallucinations    Aspirin  Other (See Comments)    Interferes with headaches  Other Reaction(s): stomach issues   Imipenem Other (See Comments)    Warm and tingly all over   Methocarbamol  Rash   Statins Other  (See Comments)    leg pain, muscle cramps   Zosyn [Piperacillin-Tazobactam In Dex]    Cephalexin Other (See Comments)    Stomach cramps   Clindamycin /Lincomycin Nausea And Vomiting and Rash   Contrast Media [Iodinated Contrast Media] Rash   Losartan Other (See Comments)    Pt unsure   Losartan Potassium-Hctz Other (See Comments)    Unknown Reaction - pt is unaware of this  Other Reaction(s): do not remember   Methylprednisolone  Rash    Rash on face   Penicillins Rash        Quinolones Rash    Past Medical History, Surgical history, Social history, and Family History were reviewed and updated.  Review of Systems: Review of Systems  Constitutional: Negative.   HENT:  Negative.    Eyes: Negative.   Respiratory: Negative.    Cardiovascular: Negative.   Gastrointestinal: Negative.   Endocrine: Negative.   Genitourinary: Negative.    Musculoskeletal: Negative.   Skin: Negative.   Neurological: Negative.   Hematological: Negative.   Psychiatric/Behavioral: Negative.      Physical Exam:  Vital signs show temperature of 99.1.  Pulse 52.  Blood pressure 138/70.  Weight is 166 pounds.    Wt Readings from Last 3 Encounters:  06/04/24 166 lb (75.3 kg)  05/29/24 164 lb (74.4 kg)  04/16/24 164 lb 12.8 oz (74.8 kg)    Physical Exam Vitals reviewed.  HENT:     Head: Normocephalic and atraumatic.  Eyes:     Pupils: Pupils are equal, round, and reactive to light.  Cardiovascular:     Rate and Rhythm: Normal rate and regular rhythm.     Heart sounds: Normal heart sounds.  Pulmonary:     Effort: Pulmonary effort is normal.     Breath sounds: Normal breath sounds.  Abdominal:     General: Bowel sounds are normal.     Palpations: Abdomen is soft.  Musculoskeletal:        General: No tenderness or deformity. Normal range of motion.     Cervical back: Normal range of motion.  Lymphadenopathy:     Cervical: No cervical adenopathy.  Skin:    General: Skin is warm and dry.      Findings: No erythema or rash.  Neurological:     Mental Status: She is alert and oriented to person, place, and time.  Psychiatric:        Behavior: Behavior normal.        Thought Content: Thought content normal.        Judgment: Judgment normal.      Lab Results  Component Value Date   WBC 12.9 (H) 06/04/2024   HGB 14.8 06/04/2024   HCT 43.6 06/04/2024   MCV 97.3 06/04/2024   PLT 356 06/04/2024     Chemistry      Component Value Date/Time   NA 142 06/04/2024 0833   NA 139 02/24/2022 1001   K 4.3 06/04/2024 0833   CL 105 06/04/2024 0833   CO2 25 06/04/2024 0833   BUN 11 06/04/2024 0833   BUN 7 (L) 02/24/2022 1001   CREATININE 0.67 06/04/2024 0833      Component Value Date/Time   CALCIUM  10.0 06/04/2024 0833   ALKPHOS 52 06/04/2024 0833   AST 13 (L) 06/04/2024 0833   ALT 11 06/04/2024 0833   BILITOT 0.5 06/04/2024 0833       Impression and Plan: Ms. Gregg is a very charming 78 year old white female with relapse of her warm auto immune hemolytic anemia.  Again, I am not sure as to what triggered this.  Regardless, she is responding very nicely.  Of note, her reticulocyte count is only 2% now.  I feel bad that her foot is having difficulties.  Hopefully, the podiatrist will be able to help her out.  Will plan to get her back right before Thanksgiving.  If all looks good at that point, then we will get her back probably through most of the Winter.   Maude JONELLE Crease, MD 9/15/20259:25 AM

## 2024-06-05 ENCOUNTER — Other Ambulatory Visit

## 2024-06-06 ENCOUNTER — Telehealth: Payer: Self-pay | Admitting: Podiatry

## 2024-06-06 NOTE — Telephone Encounter (Signed)
 Pt called in inquiring about her cat scan result that was done on Friday I explain to the pt that the results are not back yet . The pt stated that she is in pain and she needs to know what is going on with her foot I explain to the pt that when the results come in someone will reach out to her. Pt. Would like to know what is going to be done in the mean time she is in pain and cannot wait  two or three weeks for the results to come back.

## 2024-06-07 ENCOUNTER — Telehealth: Payer: Self-pay | Admitting: Podiatry

## 2024-06-07 NOTE — Telephone Encounter (Signed)
 Patient state she had a cat scan on 9/9 and wants to know the results.

## 2024-07-09 ENCOUNTER — Ambulatory Visit (INDEPENDENT_AMBULATORY_CARE_PROVIDER_SITE_OTHER): Admitting: Podiatry

## 2024-07-09 VITALS — Ht 65.0 in | Wt 166.0 lb

## 2024-07-09 DIAGNOSIS — M722 Plantar fascial fibromatosis: Secondary | ICD-10-CM

## 2024-07-09 LAB — TRANSFUSION REACTION
DAT C3: NEGATIVE
Post RXN DAT IgG: POSITIVE

## 2024-07-09 NOTE — Progress Notes (Signed)
 Subjective:  Patient ID: [name], female    DOB: [birth year],  MRN: [id number]  Chief Complaint  Patient presents with   Injections    Rm 21 Patient is here for injection of the left foot.  Pt states pain is primarily located by the left arch.    78 y.o. female presents with the above complaint. History confirmed with patient.    Objective:  Physical Exam: warm, good capillary refill, no trophic changes or ulcerative lesions, normal DP and PT pulses, normal sensory exam, and pain and tenderness around the sesamoid complex on the plantar left foot and along the abductor hallucis muscle belly.   Radiographs: Multiple views x-ray of both feet: previous MTP fusion and metatarsal osteotomies on the left foot without complication no evidence of fracture or stress fracture some moderate arthritic changes in the midfoot and lesser MTP joints.  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 History of second and third hammertoe correction surgeries, hallux fusion, and metatarsal osteotomy with arthrodesis.   EXAM: CT OF THE LEFT FOOT WITHOUT CONTRAST   TECHNIQUE: Multidetector CT imaging of the left foot was performed according to the standard protocol. Multiplanar CT image reconstructions were also generated.   RADIATION DOSE REDUCTION: This exam was performed according to the departmental dose-optimization program which includes automated exposure control, adjustment of the mA and/or kV according to patient size and/or use of iterative reconstruction technique.   COMPARISON:  05/29/2024   FINDINGS: Bones/Joint/Cartilage   Screws are present in the heads of the second and third metatarsals with fusion of the hip joint of the third toe and partial resection of the head of the proximal phalanx second toe. Dorsal plate screw fixator along the fused first MTP joint with additional screws along the fusion  site. No abnormal lucency along the hardware.   There is notable inferior spurring of the fused first metatarsal head for example on image 13 series 8, extending deep to the flexor hallucis longus tendon. There is some proximal and lateral migration of the lateral first digit sesamoid as shown on image 43 series 7, and a somewhat diminutive and flattened appearance of the medial sesamoid on image 44 series 7.   Ligaments   Suboptimally assessed by CT.   Muscles and Tendons   Mildly atrophic adductor hallucis musculature and interosseous musculature.   Soft tissues   Subcutaneous density in the soft tissues plantar to the first MTP joint along the ball of the foot, this could be related to edema or scarring in the region. Similar findings plantar to the fifth MTP joint, cannot exclude adventitial bursitis.   IMPRESSION: 1. Fusion of the first MTP joint with dorsal plate screw fixator and additional screws along the fusion site. There is notable inferior spurring of the fused first metatarsal head extending deep to the flexor hallucis longus tendon. 2. Proximal and lateral migration of the lateral first digit sesamoid, and a somewhat diminutive and flattened appearance of the medial sesamoid. 3. Subcutaneous density in the soft tissues plantar to the first MTP joint along the ball of the foot, this could be related to edema or scarring in the region. Similar findings plantar to the fifth MTP joint, cannot exclude adventitial bursitis. 4. Mildly atrophic adductor hallucis musculature and interosseous musculature.     Electronically Signed   By: Ryan Salvage M.D.   On: 06/07/2024 13:21   Results  Collected Updated Procedure  05/29/2024 1616 05/30/2024 0936 Uric Acid [[id number]]   Blood   Component Value Units  Uric Acid 5.1  mg/dL       90/90/7974 8383 90/89/7974 0936 Sedimentation Rate [[id number]]   Blood   Component Value Units  Sed Rate 9 mm/hr        05/29/2024 1616 05/30/2024 0936 C-reactive protein [[id number]]   Blood   Component Value Units  CRP 3 mg/L     Assessment:   1. Plantar fibromatosis      Plan:  Patient was evaluated and treated and all questions answered.  Reviewed her CT results and lab work.  Relatively unrevealing as to a specific etiology.  Most of the pain is along the medial band of plantar fascia.  There is no palpable fibroma but I discussed with her that could be a small 1 developing here and recommended injection of the area.  Risk discussed.  We also discussed that there is possibly neuropathic pain as a contributing factor.  She wished to proceed with injection today.  Following consent and prep with alcohol  the medial band of the plantar fascia proximal to the sesamoid complex along the abductor hallucis and flexor hallucis brevis muscle belly were injected with 20 mg Kenalog  4 mg of dexamethasone  phosphate and 5 mg of Marcaine .  She tolerated this well.  Return in about 1 month (around 08/09/2024) for f/u after left foot injection .

## 2024-08-06 ENCOUNTER — Other Ambulatory Visit (HOSPITAL_BASED_OUTPATIENT_CLINIC_OR_DEPARTMENT_OTHER): Payer: Self-pay

## 2024-08-07 ENCOUNTER — Encounter: Payer: Self-pay | Admitting: Hematology & Oncology

## 2024-08-07 ENCOUNTER — Other Ambulatory Visit: Payer: Self-pay

## 2024-08-07 ENCOUNTER — Inpatient Hospital Stay (HOSPITAL_BASED_OUTPATIENT_CLINIC_OR_DEPARTMENT_OTHER): Admitting: Hematology & Oncology

## 2024-08-07 ENCOUNTER — Inpatient Hospital Stay: Attending: Hematology & Oncology

## 2024-08-07 DIAGNOSIS — Z79891 Long term (current) use of opiate analgesic: Secondary | ICD-10-CM | POA: Diagnosis not present

## 2024-08-07 DIAGNOSIS — D599 Acquired hemolytic anemia, unspecified: Secondary | ICD-10-CM

## 2024-08-07 DIAGNOSIS — D5911 Warm autoimmune hemolytic anemia: Secondary | ICD-10-CM | POA: Diagnosis present

## 2024-08-07 LAB — CMP (CANCER CENTER ONLY)
ALT: 17 U/L (ref 0–44)
AST: 22 U/L (ref 15–41)
Albumin: 4.7 g/dL (ref 3.5–5.0)
Alkaline Phosphatase: 60 U/L (ref 38–126)
Anion gap: 14 (ref 5–15)
BUN: 9 mg/dL (ref 8–23)
CO2: 22 mmol/L (ref 22–32)
Calcium: 10.3 mg/dL (ref 8.9–10.3)
Chloride: 106 mmol/L (ref 98–111)
Creatinine: 0.67 mg/dL (ref 0.44–1.00)
GFR, Estimated: >60 mL/min (ref >60)
Glucose, Bld: 101 mg/dL — ABNORMAL HIGH (ref 70–99)
Potassium: 4.9 mmol/L (ref 3.5–5.1)
Sodium: 142 mmol/L (ref 135–145)
Total Bilirubin: 0.6 mg/dL (ref 0.0–1.2)
Total Protein: 7.3 g/dL (ref 6.5–8.1)

## 2024-08-07 LAB — RETICULOCYTES
Immature Retic Fract: 9.3 % (ref 2.3–15.9)
RBC.: 4.29 MIL/uL (ref 3.87–5.11)
Retic Count, Absolute: 79.4 K/uL (ref 19.0–186.0)
Retic Ct Pct: 1.9 % (ref 0.4–3.1)

## 2024-08-07 LAB — CBC WITH DIFFERENTIAL (CANCER CENTER ONLY)
Abs Immature Granulocytes: 0.04 K/uL (ref 0.00–0.07)
Basophils Absolute: 0.1 K/uL (ref 0.0–0.1)
Basophils Relative: 1 %
Eosinophils Absolute: 0.2 K/uL (ref 0.0–0.5)
Eosinophils Relative: 3 %
HCT: 41.7 % (ref 36.0–46.0)
Hemoglobin: 14 g/dL (ref 12.0–15.0)
Immature Granulocytes: 1 %
Lymphocytes Relative: 21 %
Lymphs Abs: 1.3 K/uL (ref 0.7–4.0)
MCH: 32.2 pg (ref 26.0–34.0)
MCHC: 33.6 g/dL (ref 30.0–36.0)
MCV: 95.9 fL (ref 80.0–100.0)
Monocytes Absolute: 0.7 K/uL (ref 0.1–1.0)
Monocytes Relative: 11 %
Neutro Abs: 4.2 K/uL (ref 1.7–7.7)
Neutrophils Relative %: 63 %
Platelet Count: 336 K/uL (ref 150–400)
RBC: 4.35 MIL/uL (ref 3.87–5.11)
RDW: 12.1 % (ref 11.5–15.5)
WBC Count: 6.5 K/uL (ref 4.0–10.5)
nRBC: 0 % (ref 0.0–0.2)

## 2024-08-07 LAB — SAVE SMEAR(SSMR), FOR PROVIDER SLIDE REVIEW

## 2024-08-07 LAB — LACTATE DEHYDROGENASE: LDH: 284 U/L — ABNORMAL HIGH (ref 105–235)

## 2024-08-07 NOTE — Progress Notes (Signed)
 Hematology and Oncology Follow Up Visit  [name] [id number] 1946/05/07 78 y.o. 08/07/2024   Principle Diagnosis:  Warm autoimmune hemolytic anemia-relapse  Current Therapy:   Prednisone  80 mg p.o. daily -changed to 60 mg p.o. daily on 03/05/2024 IVIG the 80 g IV daily x 2 days Folic acid  2 mg p.o. daily Rituxan  375 mg/m IV weekly x 4 weeks     Interim History:  [name] is back for follow-up.  We last saw her back in September.  Since then, she been doing pretty well.  It is her husband that is having the problems.  She is helping to try to manage him.  She has had no problems otherwise.  She does have some foot issues.  Thankfully, there is nothing that is broken.  She has had no issues with fever.  She has had no cough or shortness of breath.  There is been no change in bowel or bladder habits.  She has had no bleeding.  There is been no leg swelling.  She has had no rashes.  I know that she will be busy over the Holiday season.  Overall, I will say that her performance status is probably ECOG 1.  Medications:  Current Outpatient Medications:    diazepam (VALIUM) 5 MG tablet, Take 5 mg by mouth., Disp: , Rfl:    fluconazole  (DIFLUCAN ) 150 MG tablet, Take 150 mg by mouth., Disp: , Rfl:    omeprazole (PRILOSEC) 20 MG capsule, Take 20 mg by mouth daily., Disp: , Rfl:    predniSONE  (STERAPRED UNI-PAK 21 TAB) 10 MG (21) TBPK tablet, Take 10 mg by mouth daily., Disp: , Rfl:    acetaminophen  (TYLENOL ) 500 MG tablet, Take 1,000 mg by mouth every 4 (four) hours as needed for headache., Disp: , Rfl:    Calcium  Carbonate+Vitamin D  600-200 MG-UNIT TABS, Take 1 tablet by mouth 2 (two) times daily., Disp: , Rfl:    Carboxymethylcellulose Sod PF (EQ RESTORE PLUS LUBRICANT EYE) 0.5 % SOLN, Place 1 drop into both eyes daily as needed (Dry eye)., Disp: , Rfl:    clopidogrel  (PLAVIX ) 75 MG tablet, Take 75 mg by mouth daily., Disp: , Rfl:    diphenhydrAMINE  (BENADRYL ) 25 MG tablet, Take  25-50 mg by mouth every 6 (six) hours as needed for allergies., Disp: , Rfl:    estradiol (ESTRACE) 0.1 MG/GM vaginal cream, Place 1 Applicatorful vaginally at bedtime., Disp: , Rfl:    folic acid  (FOLVITE ) 1 MG tablet, Take 2 tablets (2 mg total) by mouth daily., Disp: , Rfl:    gabapentin  (NEURONTIN ) 300 MG capsule, Take 1-2 capsules (300-600 mg total) by mouth 4 (four) times daily as needed (max. 2400mg  daily)., Disp: 720 capsule, Rfl: 2   HYDROcodone -acetaminophen  (NORCO/VICODIN) 5-325 MG tablet, Take 1 tablet by mouth 2 (two) times daily., Disp: , Rfl:    hydrocortisone 2.5 % cream, Apply 1 Application topically daily as needed (Irritation). On face, Disp: , Rfl:    levothyroxine  (SYNTHROID ) 112 MCG tablet, Take 112 mcg by mouth daily before breakfast., Disp: , Rfl:    LORazepam  (ATIVAN ) 2 MG tablet, Take 2 mg by mouth 3 (three) times daily as needed for anxiety., Disp: , Rfl:    MELATONIN PO, Take 5 mg by mouth at bedtime., Disp: , Rfl:    methocarbamol  (ROBAXIN ) 750 MG tablet, Take 750 mg by mouth 2 (two) times daily as needed for muscle spasms., Disp: , Rfl:    Multiple Vitamins-Minerals (MULTIVITAMIN WITH MINERALS) tablet,  Take 1 tablet by mouth daily. Complete, Disp: , Rfl:    polycarbophil (FIBERCON) 625 MG tablet, Take 625 mg by mouth 2 (two) times daily., Disp: , Rfl:    sertraline  (ZOLOFT ) 100 MG tablet, Take 100 mg by mouth daily., Disp: , Rfl:    UNABLE TO FIND, Med Name:Rexagon injections, Disp: , Rfl:   Allergies:  Allergies  Allergen Reactions   Oxycodone Other (See Comments)    Hallucinations    Aspirin  Other (See Comments)    Interferes with headaches  Other Reaction(s): stomach issues   Imipenem Other (See Comments)    Warm and tingly all over   Methocarbamol  Rash   Statins Other (See Comments)    leg pain, muscle cramps   Zosyn [Piperacillin-Tazobactam In Dex]    Cephalexin Other (See Comments)    Stomach cramps   Clindamycin /Lincomycin Nausea And Vomiting and  Rash   Contrast Media [Iodinated Contrast Media] Rash   Losartan Other (See Comments)    Pt unsure   Losartan Potassium-Hctz Other (See Comments)    Unknown Reaction - pt is unaware of this  Other Reaction(s): do not remember   Methylprednisolone  Rash    Rash on face   Penicillins Rash        Quinolones Rash    Past Medical History, Surgical history, Social history, and Family History were reviewed and updated.  Review of Systems: Review of Systems  Constitutional: Negative.   HENT:  Negative.    Eyes: Negative.   Respiratory: Negative.    Cardiovascular: Negative.   Gastrointestinal: Negative.   Endocrine: Negative.   Genitourinary: Negative.    Musculoskeletal: Negative.   Skin: Negative.   Neurological: Negative.   Hematological: Negative.   Psychiatric/Behavioral: Negative.      Physical Exam:  Vital signs show temperature of 97.9.  Pulse 67.  Blood pressure 140/90.  Weight is 164 pounds.     Wt Readings from Last 3 Encounters:  08/07/24 (P) 164 lb (74.4 kg)  07/09/24 166 lb (75.3 kg)  06/04/24 166 lb (75.3 kg)    Physical Exam Vitals reviewed.  HENT:     Head: Normocephalic and atraumatic.  Eyes:     Pupils: Pupils are equal, round, and reactive to light.  Cardiovascular:     Rate and Rhythm: Normal rate and regular rhythm.     Heart sounds: Normal heart sounds.  Pulmonary:     Effort: Pulmonary effort is normal.     Breath sounds: Normal breath sounds.  Abdominal:     General: Bowel sounds are normal.     Palpations: Abdomen is soft.  Musculoskeletal:        General: No tenderness or deformity. Normal range of motion.     Cervical back: Normal range of motion.  Lymphadenopathy:     Cervical: No cervical adenopathy.  Skin:    General: Skin is warm and dry.     Findings: No erythema or rash.  Neurological:     Mental Status: She is alert and oriented to person, place, and time.  Psychiatric:        Behavior: Behavior normal.        Thought  Content: Thought content normal.        Judgment: Judgment normal.      Lab Results  Component Value Date   WBC 6.5 08/07/2024   HGB 14.0 08/07/2024   HCT 41.7 08/07/2024   MCV 95.9 08/07/2024   PLT 336 08/07/2024     Chemistry  Component Value Date/Time   NA 142 08/07/2024 0828   NA 139 02/24/2022 1001   K 4.9 08/07/2024 0828   CL 106 08/07/2024 0828   CO2 22 08/07/2024 0828   BUN 9 08/07/2024 0828   BUN 7 (L) 02/24/2022 1001   CREATININE 0.67 08/07/2024 0828      Component Value Date/Time   CALCIUM  10.3 08/07/2024 0828   ALKPHOS 60 08/07/2024 0828   AST 22 08/07/2024 0828   ALT 17 08/07/2024 0828   BILITOT 0.6 08/07/2024 0828       Impression and Plan: Ms. Sawyers is a very charming 78 year old white female with relapse of her warm auto immune hemolytic anemia.  Again, I am not sure as to what triggered this.  Regardless, she has responded very nicely.  Of note, her reticulocyte count is only 1.9% now.  At this point, we will just follow her along.  Will try to get her through the holidays and through most of Winter.  I told her make sure that she bundles that when she goes outside.  I suppose that the cold weather could certainly maybe activate this hemolysis.  I just want her to have a good quality of life and to be able to enjoy the Holiday season.   Maude JONELLE Crease, MD 11/18/20259:25 AM

## 2024-08-09 ENCOUNTER — Ambulatory Visit (INDEPENDENT_AMBULATORY_CARE_PROVIDER_SITE_OTHER): Admitting: Podiatry

## 2024-08-09 VITALS — Ht 65.0 in | Wt 166.0 lb

## 2024-08-09 DIAGNOSIS — M19072 Primary osteoarthritis, left ankle and foot: Secondary | ICD-10-CM

## 2024-08-09 NOTE — Progress Notes (Signed)
 Subjective:  Patient ID: [name], female    DOB: 1946/07/16,  MRN: [id number]  Chief Complaint  Patient presents with   Foot Pain    RM 5 Return in about 1 month (around 08/09/2024) for f/u after left foot injection. Patient is here for second injection.     78 y.o. female presents with the above complaint. History confirmed with patient.  Pain on the bottom is better but still having pain on the top  Objective:  Physical Exam: warm, good capillary refill, no trophic changes or ulcerative lesions, normal DP and PT pulses, normal sensory exam, and less pain plantar medial and more pain dorsal over the extensor tendons and midfoot   Radiographs: Multiple views x-ray of both feet: previous MTP fusion and metatarsal osteotomies on the left foot without complication no evidence of fracture or stress fracture some moderate arthritic changes in the midfoot and lesser MTP joints.  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 History of second and third hammertoe correction surgeries, hallux fusion, and metatarsal osteotomy with arthrodesis.   EXAM: CT OF THE LEFT FOOT WITHOUT CONTRAST   TECHNIQUE: Multidetector CT imaging of the left foot was performed according to the standard protocol. Multiplanar CT image reconstructions were also generated.   RADIATION DOSE REDUCTION: This exam was performed according to the departmental dose-optimization program which includes automated exposure control, adjustment of the mA and/or kV according to patient size and/or use of iterative reconstruction technique.   COMPARISON:  05/29/2024   FINDINGS: Bones/Joint/Cartilage   Screws are present in the heads of the second and third metatarsals with fusion of the hip joint of the third toe and partial resection of the head of the proximal phalanx second toe. Dorsal plate screw fixator along the fused first MTP joint with  additional screws along the fusion site. No abnormal lucency along the hardware.   There is notable inferior spurring of the fused first metatarsal head for example on image 13 series 8, extending deep to the flexor hallucis longus tendon. There is some proximal and lateral migration of the lateral first digit sesamoid as shown on image 43 series 7, and a somewhat diminutive and flattened appearance of the medial sesamoid on image 44 series 7.   Ligaments   Suboptimally assessed by CT.   Muscles and Tendons   Mildly atrophic adductor hallucis musculature and interosseous musculature.   Soft tissues   Subcutaneous density in the soft tissues plantar to the first MTP joint along the ball of the foot, this could be related to edema or scarring in the region. Similar findings plantar to the fifth MTP joint, cannot exclude adventitial bursitis.   IMPRESSION: 1. Fusion of the first MTP joint with dorsal plate screw fixator and additional screws along the fusion site. There is notable inferior spurring of the fused first metatarsal head extending deep to the flexor hallucis longus tendon. 2. Proximal and lateral migration of the lateral first digit sesamoid, and a somewhat diminutive and flattened appearance of the medial sesamoid. 3. Subcutaneous density in the soft tissues plantar to the first MTP joint along the ball of the foot, this could be related to edema or scarring in the region. Similar findings plantar to the fifth MTP joint, cannot exclude adventitial bursitis. 4. Mildly atrophic adductor hallucis musculature and interosseous musculature.     Electronically Signed   By: Ryan Salvage M.D.   On: 06/07/2024 13:21  Results  Collected Updated Procedure   05/29/2024 1616 05/30/2024 0936 Uric Acid [[id number]]   Blood   Component Value Units  Uric Acid 5.1  mg/dL       90/90/7974 8383 90/89/7974 0936 Sedimentation Rate [[id number]]   Blood   Component  Value Units  Sed Rate 9 mm/hr       05/29/2024 1616 05/30/2024 0936 C-reactive protein [[id number]]   Blood   Component Value Units  CRP 3 mg/L     Assessment:   1. Arthritis of left midfoot      Plan:  Patient was evaluated and treated and all questions answered.  Has not some improvement with plantar fascial injection, I discussed with her she does have significant midfoot arthritis in the dorsal midfoot.  We also discussed that peripheral neuropathy and neuritis secondary to spinal pathology is also a possibility here.  We proceeded today with diagnostic and therapeutic injection of the dorsal midfoot over the midfoot arthritic areas.  I discussed with her if this has minimal lasting improvement and this likely is neuritic in nature and I will follow-up with her in 6 weeks.  Following sterile prep with alcohol  the dorsal midfoot was injected with 20 mg of Kenalog  4 mg of dexamethasone  and 5 mg of Marcaine .  She tolerated this well.  Return in about 6 weeks (around 09/20/2024) for f/u left foot pain and injection .

## 2024-08-13 ENCOUNTER — Telehealth: Payer: Self-pay | Admitting: Adult Health

## 2024-08-13 ENCOUNTER — Other Ambulatory Visit: Payer: Self-pay | Admitting: Adult Health

## 2024-08-13 NOTE — Telephone Encounter (Signed)
 Pt called stating that she is  out of medication .  Pt  stated that  medication refill is not until Friday and  want to request  to get medication sooner since Pt is out of medication gabapentin  (NEURONTIN ) 300 MG capsule   Pharmacy    [id number] - [place], [place] - [address] (Ph: [phone])

## 2024-08-13 NOTE — Telephone Encounter (Signed)
 I called and spoke to the patient's pharmacy. They are not sure where she got Friday from to fill her medication. Patient last filled medication on 05/21/24 and they were able to get her next refill ready for today. I called the patient and left a message about her refill request.

## 2024-08-14 ENCOUNTER — Encounter: Payer: Self-pay | Admitting: Hematology & Oncology

## 2024-09-24 ENCOUNTER — Encounter: Payer: Self-pay | Admitting: Hematology & Oncology

## 2024-09-25 ENCOUNTER — Ambulatory Visit: Admitting: Podiatry

## 2024-10-15 ENCOUNTER — Encounter: Payer: Self-pay | Admitting: Hematology & Oncology

## 2024-10-21 ENCOUNTER — Telehealth: Payer: Self-pay | Admitting: Adult Health

## 2024-10-21 NOTE — Telephone Encounter (Signed)
Office closed due to weather 

## 2024-10-22 ENCOUNTER — Ambulatory Visit: Admitting: Adult Health

## 2024-10-23 ENCOUNTER — Inpatient Hospital Stay: Admitting: Hematology & Oncology

## 2024-10-23 ENCOUNTER — Inpatient Hospital Stay: Attending: Hematology & Oncology

## 2024-10-23 ENCOUNTER — Encounter: Payer: Self-pay | Admitting: Hematology & Oncology

## 2024-10-23 ENCOUNTER — Other Ambulatory Visit: Payer: Self-pay

## 2024-10-23 VITALS — BP 130/85 | HR 71 | Temp 98.4°F | Resp 20 | Ht 65.0 in | Wt 165.0 lb

## 2024-10-23 DIAGNOSIS — D599 Acquired hemolytic anemia, unspecified: Secondary | ICD-10-CM

## 2024-10-23 LAB — CMP (CANCER CENTER ONLY)
ALT: 15 U/L (ref 0–44)
AST: 19 U/L (ref 15–41)
Albumin: 4.6 g/dL (ref 3.5–5.0)
Alkaline Phosphatase: 74 U/L (ref 38–126)
Anion gap: 13 (ref 5–15)
BUN: 8 mg/dL (ref 8–23)
CO2: 24 mmol/L (ref 22–32)
Calcium: 10.5 mg/dL — ABNORMAL HIGH (ref 8.9–10.3)
Chloride: 105 mmol/L (ref 98–111)
Creatinine: 0.58 mg/dL (ref 0.44–1.00)
GFR, Estimated: >60 mL/min
Glucose, Bld: 107 mg/dL — ABNORMAL HIGH (ref 70–99)
Potassium: 4.5 mmol/L (ref 3.5–5.1)
Sodium: 142 mmol/L (ref 135–145)
Total Bilirubin: 0.5 mg/dL (ref 0.0–1.2)
Total Protein: 7.4 g/dL (ref 6.5–8.1)

## 2024-10-23 LAB — RETICULOCYTES
Immature Retic Fract: 10.7 % (ref 2.3–15.9)
RBC.: 4.29 MIL/uL (ref 3.87–5.11)
Retic Count, Absolute: 54.9 10*3/uL (ref 19.0–186.0)
Retic Ct Pct: 1.3 % (ref 0.4–3.1)

## 2024-10-23 LAB — CBC WITH DIFFERENTIAL (CANCER CENTER ONLY)
Abs Immature Granulocytes: 0.02 10*3/uL (ref 0.00–0.07)
Basophils Absolute: 0 10*3/uL (ref 0.0–0.1)
Basophils Relative: 0 %
Eosinophils Absolute: 0.1 10*3/uL (ref 0.0–0.5)
Eosinophils Relative: 2 %
HCT: 41.2 % (ref 36.0–46.0)
Hemoglobin: 13.9 g/dL (ref 12.0–15.0)
Immature Granulocytes: 0 %
Lymphocytes Relative: 23 %
Lymphs Abs: 1.5 10*3/uL (ref 0.7–4.0)
MCH: 32 pg (ref 26.0–34.0)
MCHC: 33.7 g/dL (ref 30.0–36.0)
MCV: 94.9 fL (ref 80.0–100.0)
Monocytes Absolute: 0.5 10*3/uL (ref 0.1–1.0)
Monocytes Relative: 8 %
Neutro Abs: 4.2 10*3/uL (ref 1.7–7.7)
Neutrophils Relative %: 67 %
Platelet Count: 404 10*3/uL — ABNORMAL HIGH (ref 150–400)
RBC: 4.34 MIL/uL (ref 3.87–5.11)
RDW: 11.3 % — ABNORMAL LOW (ref 11.5–15.5)
WBC Count: 6.3 10*3/uL (ref 4.0–10.5)
nRBC: 0 % (ref 0.0–0.2)

## 2024-10-23 LAB — LACTATE DEHYDROGENASE: LDH: 166 U/L (ref 105–235)

## 2024-10-23 LAB — SAVE SMEAR(SSMR), FOR PROVIDER SLIDE REVIEW

## 2024-10-24 LAB — HAPTOGLOBIN: Haptoglobin: 186 mg/dL (ref 42–346)

## 2024-12-05 ENCOUNTER — Ambulatory Visit: Admitting: Adult Health

## 2025-01-21 ENCOUNTER — Inpatient Hospital Stay

## 2025-01-21 ENCOUNTER — Inpatient Hospital Stay: Admitting: Hematology & Oncology
